# Patient Record
Sex: Female | Born: 1935 | Race: White | Hispanic: No | Smoking: Never smoker
Health system: Southern US, Community
[De-identification: ages and names within clinical notes are randomized; demographics above are authoritative.]

## PROBLEM LIST (undated history)

## (undated) DIAGNOSIS — I10 Essential (primary) hypertension: Secondary | ICD-10-CM

## (undated) DIAGNOSIS — Z972 Presence of dental prosthetic device (complete) (partial): Secondary | ICD-10-CM

## (undated) DIAGNOSIS — Z9289 Personal history of other medical treatment: Secondary | ICD-10-CM

## (undated) DIAGNOSIS — M47812 Spondylosis without myelopathy or radiculopathy, cervical region: Secondary | ICD-10-CM

## (undated) DIAGNOSIS — J449 Chronic obstructive pulmonary disease, unspecified: Secondary | ICD-10-CM

## (undated) DIAGNOSIS — I701 Atherosclerosis of renal artery: Secondary | ICD-10-CM

## (undated) DIAGNOSIS — M4145 Neuromuscular scoliosis, thoracolumbar region: Secondary | ICD-10-CM

## (undated) DIAGNOSIS — I7300 Raynaud's syndrome without gangrene: Secondary | ICD-10-CM

## (undated) DIAGNOSIS — J45909 Unspecified asthma, uncomplicated: Secondary | ICD-10-CM

## (undated) DIAGNOSIS — I5022 Chronic systolic (congestive) heart failure: Secondary | ICD-10-CM

## (undated) DIAGNOSIS — K219 Gastro-esophageal reflux disease without esophagitis: Secondary | ICD-10-CM

## (undated) DIAGNOSIS — K08109 Complete loss of teeth, unspecified cause, unspecified class: Secondary | ICD-10-CM

## (undated) DIAGNOSIS — Z8249 Family history of ischemic heart disease and other diseases of the circulatory system: Secondary | ICD-10-CM

## (undated) DIAGNOSIS — M47816 Spondylosis without myelopathy or radiculopathy, lumbar region: Secondary | ICD-10-CM

## (undated) DIAGNOSIS — D649 Anemia, unspecified: Secondary | ICD-10-CM

## (undated) DIAGNOSIS — E0590 Thyrotoxicosis, unspecified without thyrotoxic crisis or storm: Secondary | ICD-10-CM

## (undated) DIAGNOSIS — Z951 Presence of aortocoronary bypass graft: Secondary | ICD-10-CM

## (undated) DIAGNOSIS — F0390 Unspecified dementia without behavioral disturbance: Secondary | ICD-10-CM

## (undated) DIAGNOSIS — I255 Ischemic cardiomyopathy: Secondary | ICD-10-CM

## (undated) HISTORY — PX: SYMPATHECTOMY: SHX792

## (undated) HISTORY — DX: Neuromuscular scoliosis, thoracolumbar region: M41.45

## (undated) HISTORY — DX: Family history of ischemic heart disease and other diseases of the circulatory system: Z82.49

## (undated) HISTORY — PX: HEMORRHOIDECTOMY WITH HEMORRHOID BANDING: SHX5633

## (undated) HISTORY — PX: PTCA: SHX146

## (undated) HISTORY — PX: CERVICAL FUSION: SHX112

## (undated) HISTORY — DX: Thyrotoxicosis, unspecified without thyrotoxic crisis or storm: E05.90

## (undated) HISTORY — PX: APPENDECTOMY: SHX54

## (undated) HISTORY — DX: Raynaud's syndrome without gangrene: I73.00

## (undated) HISTORY — DX: Spondylosis without myelopathy or radiculopathy, cervical region: M47.812

## (undated) HISTORY — PX: ROTATOR CUFF REPAIR: SHX139

## (undated) HISTORY — PX: TONSILLECTOMY: SHX5217

## (undated) HISTORY — PX: BREAST LUMPECTOMY: SHX2

## (undated) HISTORY — PX: CATARACT EXTRACTION: SUR2

## (undated) HISTORY — PX: UMBILICAL HERNIA REPAIR: SHX196

## (undated) HISTORY — DX: Spondylosis without myelopathy or radiculopathy, lumbar region: M47.816

## (undated) HISTORY — DX: Chronic systolic (congestive) heart failure: I50.22

## (undated) HISTORY — PX: FACIAL COSMETIC SURGERY: SHX629

## (undated) HISTORY — DX: Atherosclerosis of renal artery: I70.1

## (undated) HISTORY — DX: Essential (primary) hypertension: I10

## (undated) HISTORY — PX: OTHER SURGICAL HISTORY: SHX169

## (undated) HISTORY — DX: Presence of aortocoronary bypass graft: Z95.1

## (undated) HISTORY — DX: Personal history of other medical treatment: Z92.89

## (undated) HISTORY — DX: Ischemic cardiomyopathy: I25.5

## (undated) HISTORY — PX: TOTAL ABDOMINAL HYSTERECTOMY: SHX209

---

## 1998-05-07 ENCOUNTER — Ambulatory Visit (HOSPITAL_COMMUNITY): Admission: RE | Admit: 1998-05-07 | Discharge: 1998-05-07 | Payer: Self-pay | Admitting: Obstetrics and Gynecology

## 1999-02-10 ENCOUNTER — Ambulatory Visit (HOSPITAL_COMMUNITY): Admission: RE | Admit: 1999-02-10 | Discharge: 1999-02-10 | Payer: Self-pay | Admitting: Internal Medicine

## 1999-04-18 ENCOUNTER — Ambulatory Visit (HOSPITAL_BASED_OUTPATIENT_CLINIC_OR_DEPARTMENT_OTHER): Admission: RE | Admit: 1999-04-18 | Discharge: 1999-04-18 | Payer: Self-pay | Admitting: Otolaryngology

## 1999-05-19 ENCOUNTER — Ambulatory Visit (HOSPITAL_COMMUNITY): Admission: RE | Admit: 1999-05-19 | Discharge: 1999-05-19 | Payer: Self-pay | Admitting: Obstetrics and Gynecology

## 1999-05-19 ENCOUNTER — Encounter: Payer: Self-pay | Admitting: Obstetrics and Gynecology

## 1999-09-08 ENCOUNTER — Other Ambulatory Visit: Admission: RE | Admit: 1999-09-08 | Discharge: 1999-09-08 | Payer: Self-pay | Admitting: Obstetrics and Gynecology

## 1999-09-26 HISTORY — PX: WEDGE RESECTION: SHX5070

## 2000-02-15 ENCOUNTER — Encounter: Admission: RE | Admit: 2000-02-15 | Discharge: 2000-02-15 | Payer: Self-pay | Admitting: Infectious Diseases

## 2000-04-10 ENCOUNTER — Ambulatory Visit (HOSPITAL_COMMUNITY): Admission: RE | Admit: 2000-04-10 | Discharge: 2000-04-10 | Payer: Self-pay | Admitting: Otolaryngology

## 2000-04-10 ENCOUNTER — Encounter: Payer: Self-pay | Admitting: Otolaryngology

## 2000-04-11 ENCOUNTER — Encounter: Admission: RE | Admit: 2000-04-11 | Discharge: 2000-04-11 | Payer: Self-pay | Admitting: Infectious Diseases

## 2000-04-30 ENCOUNTER — Encounter: Admission: RE | Admit: 2000-04-30 | Discharge: 2000-04-30 | Payer: Self-pay | Admitting: Infectious Diseases

## 2000-05-21 ENCOUNTER — Ambulatory Visit (HOSPITAL_COMMUNITY): Admission: RE | Admit: 2000-05-21 | Discharge: 2000-05-21 | Payer: Self-pay | Admitting: Obstetrics and Gynecology

## 2000-05-21 ENCOUNTER — Encounter: Payer: Self-pay | Admitting: Obstetrics and Gynecology

## 2000-07-29 ENCOUNTER — Inpatient Hospital Stay (HOSPITAL_COMMUNITY): Admission: EM | Admit: 2000-07-29 | Discharge: 2000-08-02 | Payer: Self-pay | Admitting: *Deleted

## 2000-07-30 ENCOUNTER — Encounter: Payer: Self-pay | Admitting: Internal Medicine

## 2000-08-01 ENCOUNTER — Encounter: Payer: Self-pay | Admitting: Internal Medicine

## 2000-08-15 ENCOUNTER — Encounter: Admission: RE | Admit: 2000-08-15 | Discharge: 2000-08-15 | Payer: Self-pay | Admitting: Internal Medicine

## 2000-08-15 ENCOUNTER — Encounter: Payer: Self-pay | Admitting: Internal Medicine

## 2000-08-28 ENCOUNTER — Encounter: Payer: Self-pay | Admitting: Thoracic Surgery

## 2000-08-29 ENCOUNTER — Inpatient Hospital Stay (HOSPITAL_COMMUNITY): Admission: RE | Admit: 2000-08-29 | Discharge: 2000-09-02 | Payer: Self-pay | Admitting: Thoracic Surgery

## 2000-08-29 ENCOUNTER — Encounter (INDEPENDENT_AMBULATORY_CARE_PROVIDER_SITE_OTHER): Payer: Self-pay | Admitting: Specialist

## 2000-08-29 ENCOUNTER — Encounter: Payer: Self-pay | Admitting: Thoracic Surgery

## 2000-08-30 ENCOUNTER — Encounter: Payer: Self-pay | Admitting: Thoracic Surgery

## 2000-08-31 ENCOUNTER — Encounter: Payer: Self-pay | Admitting: Thoracic Surgery

## 2000-09-01 ENCOUNTER — Encounter: Payer: Self-pay | Admitting: Thoracic Surgery

## 2000-09-07 ENCOUNTER — Encounter: Admission: RE | Admit: 2000-09-07 | Discharge: 2000-09-07 | Payer: Self-pay | Admitting: Thoracic Surgery

## 2000-09-07 ENCOUNTER — Encounter: Payer: Self-pay | Admitting: Thoracic Surgery

## 2000-09-28 ENCOUNTER — Other Ambulatory Visit: Admission: RE | Admit: 2000-09-28 | Discharge: 2000-09-28 | Payer: Self-pay | Admitting: Otolaryngology

## 2000-10-03 ENCOUNTER — Encounter: Admission: RE | Admit: 2000-10-03 | Discharge: 2000-10-03 | Payer: Self-pay | Admitting: Thoracic Surgery

## 2000-10-03 ENCOUNTER — Encounter: Payer: Self-pay | Admitting: Thoracic Surgery

## 2000-12-04 ENCOUNTER — Encounter: Payer: Self-pay | Admitting: Thoracic Surgery

## 2000-12-04 ENCOUNTER — Encounter: Admission: RE | Admit: 2000-12-04 | Discharge: 2000-12-04 | Payer: Self-pay | Admitting: Thoracic Surgery

## 2001-03-20 ENCOUNTER — Encounter: Payer: Self-pay | Admitting: Thoracic Surgery

## 2001-03-20 ENCOUNTER — Encounter: Admission: RE | Admit: 2001-03-20 | Discharge: 2001-03-20 | Payer: Self-pay | Admitting: Thoracic Surgery

## 2001-05-09 ENCOUNTER — Ambulatory Visit (HOSPITAL_BASED_OUTPATIENT_CLINIC_OR_DEPARTMENT_OTHER): Admission: RE | Admit: 2001-05-09 | Discharge: 2001-05-09 | Payer: Self-pay | Admitting: Orthopedic Surgery

## 2001-05-23 ENCOUNTER — Ambulatory Visit (HOSPITAL_COMMUNITY): Admission: RE | Admit: 2001-05-23 | Discharge: 2001-05-23 | Payer: Self-pay | Admitting: Obstetrics and Gynecology

## 2001-05-23 ENCOUNTER — Encounter: Payer: Self-pay | Admitting: Obstetrics and Gynecology

## 2001-11-19 ENCOUNTER — Encounter: Payer: Self-pay | Admitting: Internal Medicine

## 2001-11-19 ENCOUNTER — Encounter: Admission: RE | Admit: 2001-11-19 | Discharge: 2001-11-19 | Payer: Self-pay | Admitting: Internal Medicine

## 2001-12-09 ENCOUNTER — Emergency Department (HOSPITAL_COMMUNITY): Admission: EM | Admit: 2001-12-09 | Discharge: 2001-12-09 | Payer: Self-pay | Admitting: Emergency Medicine

## 2001-12-14 ENCOUNTER — Encounter: Payer: Self-pay | Admitting: Neurological Surgery

## 2001-12-14 ENCOUNTER — Ambulatory Visit (HOSPITAL_COMMUNITY): Admission: RE | Admit: 2001-12-14 | Discharge: 2001-12-14 | Payer: Self-pay | Admitting: Neurological Surgery

## 2002-02-14 ENCOUNTER — Encounter: Payer: Self-pay | Admitting: Neurological Surgery

## 2002-02-18 ENCOUNTER — Encounter: Payer: Self-pay | Admitting: Neurological Surgery

## 2002-02-18 ENCOUNTER — Inpatient Hospital Stay (HOSPITAL_COMMUNITY): Admission: RE | Admit: 2002-02-18 | Discharge: 2002-02-19 | Payer: Self-pay | Admitting: Neurological Surgery

## 2002-05-29 ENCOUNTER — Encounter: Payer: Self-pay | Admitting: Obstetrics and Gynecology

## 2002-05-29 ENCOUNTER — Ambulatory Visit (HOSPITAL_COMMUNITY): Admission: RE | Admit: 2002-05-29 | Discharge: 2002-05-29 | Payer: Self-pay | Admitting: Obstetrics and Gynecology

## 2002-11-26 ENCOUNTER — Encounter: Payer: Self-pay | Admitting: Neurological Surgery

## 2002-11-26 ENCOUNTER — Ambulatory Visit (HOSPITAL_COMMUNITY): Admission: RE | Admit: 2002-11-26 | Discharge: 2002-11-26 | Payer: Self-pay | Admitting: Neurological Surgery

## 2003-06-04 ENCOUNTER — Ambulatory Visit (HOSPITAL_COMMUNITY): Admission: RE | Admit: 2003-06-04 | Discharge: 2003-06-04 | Payer: Self-pay | Admitting: Obstetrics and Gynecology

## 2003-06-04 ENCOUNTER — Encounter: Payer: Self-pay | Admitting: Obstetrics and Gynecology

## 2003-06-11 ENCOUNTER — Encounter: Payer: Self-pay | Admitting: Internal Medicine

## 2003-06-11 ENCOUNTER — Encounter: Admission: RE | Admit: 2003-06-11 | Discharge: 2003-06-11 | Payer: Self-pay | Admitting: Internal Medicine

## 2003-09-09 ENCOUNTER — Encounter: Admission: RE | Admit: 2003-09-09 | Discharge: 2003-09-09 | Payer: Self-pay | Admitting: Neurological Surgery

## 2003-10-07 ENCOUNTER — Ambulatory Visit (HOSPITAL_COMMUNITY): Admission: RE | Admit: 2003-10-07 | Discharge: 2003-10-07 | Payer: Self-pay | Admitting: Neurological Surgery

## 2004-06-06 ENCOUNTER — Ambulatory Visit (HOSPITAL_COMMUNITY): Admission: RE | Admit: 2004-06-06 | Discharge: 2004-06-06 | Payer: Self-pay | Admitting: Obstetrics and Gynecology

## 2005-06-26 ENCOUNTER — Encounter: Admission: RE | Admit: 2005-06-26 | Discharge: 2005-06-26 | Payer: Self-pay | Admitting: Neurological Surgery

## 2005-07-10 ENCOUNTER — Ambulatory Visit (HOSPITAL_COMMUNITY): Admission: RE | Admit: 2005-07-10 | Discharge: 2005-07-10 | Payer: Self-pay | Admitting: Internal Medicine

## 2005-09-04 ENCOUNTER — Encounter: Admission: RE | Admit: 2005-09-04 | Discharge: 2005-09-04 | Payer: Self-pay | Admitting: Orthopedic Surgery

## 2005-09-08 ENCOUNTER — Ambulatory Visit (HOSPITAL_COMMUNITY): Admission: RE | Admit: 2005-09-08 | Discharge: 2005-09-08 | Payer: Self-pay | Admitting: Orthopedic Surgery

## 2005-09-08 ENCOUNTER — Ambulatory Visit (HOSPITAL_BASED_OUTPATIENT_CLINIC_OR_DEPARTMENT_OTHER): Admission: RE | Admit: 2005-09-08 | Discharge: 2005-09-09 | Payer: Self-pay | Admitting: Orthopedic Surgery

## 2006-01-31 ENCOUNTER — Ambulatory Visit (HOSPITAL_COMMUNITY): Admission: RE | Admit: 2006-01-31 | Discharge: 2006-01-31 | Payer: Self-pay | Admitting: Neurological Surgery

## 2006-07-11 ENCOUNTER — Ambulatory Visit (HOSPITAL_COMMUNITY): Admission: RE | Admit: 2006-07-11 | Discharge: 2006-07-11 | Payer: Self-pay | Admitting: Family Medicine

## 2007-01-10 ENCOUNTER — Ambulatory Visit (HOSPITAL_COMMUNITY): Admission: RE | Admit: 2007-01-10 | Discharge: 2007-01-10 | Payer: Self-pay | Admitting: Orthopedic Surgery

## 2007-05-28 ENCOUNTER — Encounter: Admission: RE | Admit: 2007-05-28 | Discharge: 2007-05-28 | Payer: Self-pay | Admitting: Internal Medicine

## 2007-05-28 ENCOUNTER — Encounter: Admission: RE | Admit: 2007-05-28 | Discharge: 2007-05-28 | Payer: Self-pay | Admitting: Orthopedic Surgery

## 2007-05-29 ENCOUNTER — Ambulatory Visit (HOSPITAL_BASED_OUTPATIENT_CLINIC_OR_DEPARTMENT_OTHER): Admission: RE | Admit: 2007-05-29 | Discharge: 2007-05-30 | Payer: Self-pay | Admitting: Orthopedic Surgery

## 2007-07-15 ENCOUNTER — Ambulatory Visit (HOSPITAL_COMMUNITY): Admission: RE | Admit: 2007-07-15 | Discharge: 2007-07-15 | Payer: Self-pay | Admitting: Obstetrics and Gynecology

## 2008-01-20 ENCOUNTER — Ambulatory Visit (HOSPITAL_COMMUNITY): Admission: RE | Admit: 2008-01-20 | Discharge: 2008-01-20 | Payer: Self-pay | Admitting: Neurological Surgery

## 2008-02-07 ENCOUNTER — Encounter: Admission: RE | Admit: 2008-02-07 | Discharge: 2008-02-07 | Payer: Self-pay | Admitting: Neurological Surgery

## 2008-03-05 ENCOUNTER — Ambulatory Visit (HOSPITAL_BASED_OUTPATIENT_CLINIC_OR_DEPARTMENT_OTHER): Admission: RE | Admit: 2008-03-05 | Discharge: 2008-03-05 | Payer: Self-pay | Admitting: Orthopedic Surgery

## 2008-07-15 ENCOUNTER — Ambulatory Visit (HOSPITAL_COMMUNITY): Admission: RE | Admit: 2008-07-15 | Discharge: 2008-07-15 | Payer: Self-pay | Admitting: Obstetrics and Gynecology

## 2008-09-02 ENCOUNTER — Encounter: Admission: RE | Admit: 2008-09-02 | Discharge: 2008-09-02 | Payer: Self-pay | Admitting: Orthopedic Surgery

## 2008-09-03 ENCOUNTER — Ambulatory Visit (HOSPITAL_BASED_OUTPATIENT_CLINIC_OR_DEPARTMENT_OTHER): Admission: RE | Admit: 2008-09-03 | Discharge: 2008-09-03 | Payer: Self-pay | Admitting: Orthopedic Surgery

## 2008-09-25 HISTORY — PX: OTHER SURGICAL HISTORY: SHX169

## 2008-09-25 HISTORY — PX: CORONARY ARTERY BYPASS GRAFT: SHX141

## 2008-10-29 ENCOUNTER — Ambulatory Visit (HOSPITAL_BASED_OUTPATIENT_CLINIC_OR_DEPARTMENT_OTHER): Admission: RE | Admit: 2008-10-29 | Discharge: 2008-10-29 | Payer: Self-pay | Admitting: Orthopedic Surgery

## 2008-12-14 ENCOUNTER — Encounter (INDEPENDENT_AMBULATORY_CARE_PROVIDER_SITE_OTHER): Payer: Self-pay | Admitting: Cardiology

## 2008-12-14 ENCOUNTER — Ambulatory Visit: Payer: Self-pay | Admitting: Thoracic Surgery (Cardiothoracic Vascular Surgery)

## 2008-12-14 ENCOUNTER — Ambulatory Visit (HOSPITAL_COMMUNITY): Admission: RE | Admit: 2008-12-14 | Discharge: 2008-12-14 | Payer: Self-pay | Admitting: Cardiology

## 2008-12-17 ENCOUNTER — Inpatient Hospital Stay (HOSPITAL_BASED_OUTPATIENT_CLINIC_OR_DEPARTMENT_OTHER): Admission: RE | Admit: 2008-12-17 | Discharge: 2008-12-17 | Payer: Self-pay | Admitting: Cardiology

## 2008-12-28 ENCOUNTER — Ambulatory Visit (HOSPITAL_COMMUNITY)
Admission: RE | Admit: 2008-12-28 | Discharge: 2008-12-28 | Payer: Self-pay | Admitting: Thoracic Surgery (Cardiothoracic Vascular Surgery)

## 2008-12-28 ENCOUNTER — Ambulatory Visit: Payer: Self-pay | Admitting: Thoracic Surgery (Cardiothoracic Vascular Surgery)

## 2008-12-28 ENCOUNTER — Ambulatory Visit: Payer: Self-pay | Admitting: Vascular Surgery

## 2008-12-28 ENCOUNTER — Encounter: Payer: Self-pay | Admitting: Thoracic Surgery (Cardiothoracic Vascular Surgery)

## 2009-01-01 ENCOUNTER — Ambulatory Visit (HOSPITAL_COMMUNITY)
Admission: RE | Admit: 2009-01-01 | Discharge: 2009-01-01 | Payer: Self-pay | Admitting: Thoracic Surgery (Cardiothoracic Vascular Surgery)

## 2009-01-04 ENCOUNTER — Encounter: Payer: Self-pay | Admitting: Thoracic Surgery (Cardiothoracic Vascular Surgery)

## 2009-01-04 ENCOUNTER — Inpatient Hospital Stay (HOSPITAL_COMMUNITY)
Admission: RE | Admit: 2009-01-04 | Discharge: 2009-01-18 | Payer: Self-pay | Admitting: Thoracic Surgery (Cardiothoracic Vascular Surgery)

## 2009-01-04 ENCOUNTER — Ambulatory Visit: Payer: Self-pay | Admitting: Thoracic Surgery (Cardiothoracic Vascular Surgery)

## 2009-02-08 ENCOUNTER — Encounter
Admission: RE | Admit: 2009-02-08 | Discharge: 2009-02-08 | Payer: Self-pay | Admitting: Thoracic Surgery (Cardiothoracic Vascular Surgery)

## 2009-02-08 ENCOUNTER — Ambulatory Visit: Payer: Self-pay | Admitting: Thoracic Surgery (Cardiothoracic Vascular Surgery)

## 2009-02-11 ENCOUNTER — Encounter (HOSPITAL_COMMUNITY): Admission: RE | Admit: 2009-02-11 | Discharge: 2009-05-12 | Payer: Self-pay | Admitting: Cardiology

## 2009-03-31 ENCOUNTER — Inpatient Hospital Stay (HOSPITAL_COMMUNITY): Admission: AD | Admit: 2009-03-31 | Discharge: 2009-04-01 | Payer: Self-pay | Admitting: Cardiology

## 2009-04-02 ENCOUNTER — Ambulatory Visit: Payer: Self-pay | Admitting: Cardiology

## 2009-04-03 ENCOUNTER — Inpatient Hospital Stay (HOSPITAL_COMMUNITY): Admission: EM | Admit: 2009-04-03 | Discharge: 2009-04-05 | Payer: Self-pay | Admitting: Emergency Medicine

## 2009-05-06 DIAGNOSIS — E785 Hyperlipidemia, unspecified: Secondary | ICD-10-CM

## 2009-05-06 DIAGNOSIS — I10 Essential (primary) hypertension: Secondary | ICD-10-CM | POA: Insufficient documentation

## 2009-05-07 ENCOUNTER — Ambulatory Visit: Payer: Self-pay | Admitting: Internal Medicine

## 2009-05-07 DIAGNOSIS — R0602 Shortness of breath: Secondary | ICD-10-CM | POA: Insufficient documentation

## 2009-05-12 ENCOUNTER — Telehealth: Payer: Self-pay | Admitting: Internal Medicine

## 2009-05-13 ENCOUNTER — Encounter (HOSPITAL_COMMUNITY): Admission: RE | Admit: 2009-05-13 | Discharge: 2009-06-11 | Payer: Self-pay | Admitting: Cardiology

## 2009-05-13 ENCOUNTER — Encounter: Payer: Self-pay | Admitting: Internal Medicine

## 2009-05-14 ENCOUNTER — Telehealth: Payer: Self-pay | Admitting: Internal Medicine

## 2009-05-14 ENCOUNTER — Telehealth (INDEPENDENT_AMBULATORY_CARE_PROVIDER_SITE_OTHER): Payer: Self-pay | Admitting: *Deleted

## 2009-05-17 ENCOUNTER — Encounter: Payer: Self-pay | Admitting: Internal Medicine

## 2009-05-17 ENCOUNTER — Ambulatory Visit: Payer: Self-pay | Admitting: Thoracic Surgery (Cardiothoracic Vascular Surgery)

## 2009-05-24 ENCOUNTER — Ambulatory Visit: Payer: Self-pay | Admitting: Internal Medicine

## 2009-05-25 ENCOUNTER — Encounter: Payer: Self-pay | Admitting: Internal Medicine

## 2009-05-28 ENCOUNTER — Telehealth: Payer: Self-pay | Admitting: Internal Medicine

## 2009-05-28 ENCOUNTER — Encounter: Payer: Self-pay | Admitting: Internal Medicine

## 2009-06-11 ENCOUNTER — Telehealth: Payer: Self-pay | Admitting: Internal Medicine

## 2009-06-21 ENCOUNTER — Encounter (HOSPITAL_COMMUNITY): Admission: RE | Admit: 2009-06-21 | Discharge: 2009-09-19 | Payer: Self-pay | Admitting: Internal Medicine

## 2009-06-22 ENCOUNTER — Ambulatory Visit: Admission: RE | Admit: 2009-06-22 | Discharge: 2009-06-22 | Payer: Self-pay | Admitting: Internal Medicine

## 2009-07-02 ENCOUNTER — Encounter: Payer: Self-pay | Admitting: Internal Medicine

## 2009-07-16 ENCOUNTER — Encounter: Payer: Self-pay | Admitting: Internal Medicine

## 2009-07-23 ENCOUNTER — Telehealth: Payer: Self-pay | Admitting: Internal Medicine

## 2009-07-23 ENCOUNTER — Ambulatory Visit: Payer: Self-pay | Admitting: Internal Medicine

## 2009-07-27 ENCOUNTER — Telehealth: Payer: Self-pay | Admitting: Internal Medicine

## 2009-07-30 ENCOUNTER — Encounter: Payer: Self-pay | Admitting: Internal Medicine

## 2009-10-12 ENCOUNTER — Encounter: Admission: RE | Admit: 2009-10-12 | Discharge: 2009-10-12 | Payer: Self-pay | Admitting: Obstetrics and Gynecology

## 2009-11-02 ENCOUNTER — Telehealth (INDEPENDENT_AMBULATORY_CARE_PROVIDER_SITE_OTHER): Payer: Self-pay | Admitting: *Deleted

## 2010-10-25 NOTE — Progress Notes (Signed)
Summary: order  Phone Note Call from Patient   Caller: Patient Call For: ramaswamy Summary of Call: pt need order to have oxygen and concentrator picked up Initial call taken by: Rickard Patience,  November 02, 2009 11:22 AM  Follow-up for Phone Call        pt states she has not used oxygen in 2 months. She wants an order sent to have it picked up. Please advise if ok to place the order. Carron Curie CMA  November 02, 2009 11:44 AM   Additional Follow-up for Phone Call Additional follow up Details #1::        I thought I did this long ago. wil do dme order now Additional Follow-up by: Kalman Shan MD,  November 02, 2009 4:11 PM    Additional Follow-up for Phone Call Additional follow up Details #2::    Pt aware order was sent.   Vernie Murders  November 02, 2009 4:21 PM

## 2010-12-08 ENCOUNTER — Other Ambulatory Visit: Payer: Self-pay | Admitting: Gastroenterology

## 2010-12-08 DIAGNOSIS — K224 Dyskinesia of esophagus: Secondary | ICD-10-CM

## 2010-12-09 ENCOUNTER — Encounter (INDEPENDENT_AMBULATORY_CARE_PROVIDER_SITE_OTHER): Payer: Self-pay | Admitting: *Deleted

## 2010-12-13 ENCOUNTER — Ambulatory Visit
Admission: RE | Admit: 2010-12-13 | Discharge: 2010-12-13 | Disposition: A | Discharge status: Home / Self Care | Payer: Medicare Other | Source: Ambulatory Visit | Attending: Gastroenterology | Admitting: Gastroenterology

## 2010-12-13 DIAGNOSIS — K224 Dyskinesia of esophagus: Secondary | ICD-10-CM

## 2010-12-13 NOTE — Letter (Signed)
Summary: Appointment - Reminder 2  [facility], Main Office  [address]   [place], [place] [address]   Phone: [phone]  Fax: [phone]     December 09, 2010 MRN: [id number]   [name] [address] # [place], [place]  [address]   Dear [name],  Our records indicate that it is time to schedule a follow-up appointment.  Dr. Johney Frame recommended that you follow up with [place] in April. It is very important that we reach you to schedule this appointment. We look forward to participating in your health care needs. Please contact us at the number listed above at your earliest convenience to schedule your appointment.  If you are unable to make an appointment at this time, give [place] a call so we can update our records.     Sincerely,   Glass blower/designer

## 2010-12-25 DIAGNOSIS — Z951 Presence of aortocoronary bypass graft: Secondary | ICD-10-CM | POA: Insufficient documentation

## 2010-12-25 HISTORY — DX: Presence of aortocoronary bypass graft: Z95.1

## 2010-12-30 LAB — BLOOD GAS, ARTERIAL
Acid-base deficit: 0.5 mmol/L (ref 0.0–2.0)
Bicarbonate: 22.7 mEq/L (ref 20.0–24.0)
Drawn by: 101881
FIO2: 0.21 %
O2 Saturation: 96.7 %
O2 Saturation: 98 %
Patient temperature: 98.6
Patient temperature: 98.6
TCO2: 20.6 mmol/L (ref 0–100)
pO2, Arterial: 97.4 mmHg (ref 80.0–100.0)

## 2011-01-02 LAB — BASIC METABOLIC PANEL
BUN: 14 mg/dL (ref 6–23)
CO2: 27 mEq/L (ref 19–32)
Calcium: 8.3 mg/dL — ABNORMAL LOW (ref 8.4–10.5)
GFR calc non Af Amer: >60 mL/min (ref >60)
Glucose, Bld: 117 mg/dL — ABNORMAL HIGH (ref 70–99)
Potassium: 3.9 mEq/L (ref 3.5–5.1)

## 2011-01-02 LAB — CBC
HCT: 27 % — ABNORMAL LOW (ref 36.0–46.0)
HCT: 27.3 % — ABNORMAL LOW (ref 36.0–46.0)
HCT: 27.4 % — ABNORMAL LOW (ref 36.0–46.0)
HCT: 29.2 % — ABNORMAL LOW (ref 36.0–46.0)
Hemoglobin: 9 g/dL — ABNORMAL LOW (ref 12.0–15.0)
Hemoglobin: 9 g/dL — ABNORMAL LOW (ref 12.0–15.0)
Hemoglobin: 9.5 g/dL — ABNORMAL LOW (ref 12.0–15.0)
MCHC: 32.6 g/dL (ref 30.0–36.0)
MCHC: 33 g/dL (ref 30.0–36.0)
MCV: 82.6 fL (ref 78.0–100.0)
Platelets: 128 10*3/uL — ABNORMAL LOW (ref 150–400)
Platelets: 142 10*3/uL — ABNORMAL LOW (ref 150–400)
Platelets: 164 10*3/uL (ref 150–400)
RBC: 3.31 MIL/uL — ABNORMAL LOW (ref 3.87–5.11)
RBC: 3.32 MIL/uL — ABNORMAL LOW (ref 3.87–5.11)
RBC: 3.57 MIL/uL — ABNORMAL LOW (ref 3.87–5.11)
RDW: 15.1 % (ref 11.5–15.5)
RDW: 15.2 % (ref 11.5–15.5)
RDW: 15.3 % (ref 11.5–15.5)
RDW: 15.3 % (ref 11.5–15.5)
WBC: 5.4 10*3/uL (ref 4.0–10.5)
WBC: 5.5 10*3/uL (ref 4.0–10.5)
WBC: 7.7 10*3/uL (ref 4.0–10.5)

## 2011-01-02 LAB — CULTURE, BLOOD (ROUTINE X 2): Culture: NO GROWTH

## 2011-01-02 LAB — DIFFERENTIAL
Basophils Absolute: 0 10*3/uL (ref 0.0–0.1)
Basophils Relative: 1 % (ref 0–1)
Eosinophils Relative: 16 % — ABNORMAL HIGH (ref 0–5)
Lymphocytes Relative: 31 % (ref 12–46)
Monocytes Absolute: 0.6 10*3/uL (ref 0.1–1.0)

## 2011-01-02 LAB — PROTIME-INR
INR: 1.1 (ref 0.00–1.49)
Prothrombin Time: 13.9 seconds (ref 11.6–15.2)

## 2011-01-04 LAB — CBC
HCT: 27.1 % — ABNORMAL LOW (ref 36.0–46.0)
HCT: 28.9 % — ABNORMAL LOW (ref 36.0–46.0)
HCT: 29 % — ABNORMAL LOW (ref 36.0–46.0)
HCT: 30.1 % — ABNORMAL LOW (ref 36.0–46.0)
HCT: 28.7 % — ABNORMAL LOW (ref 36.0–46.0)
HCT: 29.7 % — ABNORMAL LOW (ref 36.0–46.0)
HCT: 30.3 % — ABNORMAL LOW (ref 36.0–46.0)
HCT: 33.8 % — ABNORMAL LOW (ref 36.0–46.0)
Hemoglobin: 9.4 g/dL — ABNORMAL LOW (ref 12.0–15.0)
Hemoglobin: 9.5 g/dL — ABNORMAL LOW (ref 12.0–15.0)
Hemoglobin: 9.8 g/dL — ABNORMAL LOW (ref 12.0–15.0)
Hemoglobin: 9.8 g/dL — ABNORMAL LOW (ref 12.0–15.0)
Hemoglobin: 11.7 g/dL — ABNORMAL LOW (ref 12.0–15.0)
Hemoglobin: 9.4 g/dL — ABNORMAL LOW (ref 12.0–15.0)
Hemoglobin: 9.8 g/dL — ABNORMAL LOW (ref 12.0–15.0)
MCHC: 33.8 g/dL (ref 30.0–36.0)
MCHC: 33.8 g/dL (ref 30.0–36.0)
MCHC: 34.3 g/dL (ref 30.0–36.0)
MCHC: 34.6 g/dL (ref 30.0–36.0)
MCHC: 34.6 g/dL (ref 30.0–36.0)
MCHC: 35 g/dL (ref 30.0–36.0)
MCHC: 32.8 g/dL (ref 30.0–36.0)
MCHC: 34.2 g/dL (ref 30.0–36.0)
MCHC: 34.2 g/dL (ref 30.0–36.0)
MCHC: 34.7 g/dL (ref 30.0–36.0)
MCV: 87.6 fL (ref 78.0–100.0)
MCV: 88.2 fL (ref 78.0–100.0)
MCV: 88.2 fL (ref 78.0–100.0)
MCV: 88.6 fL (ref 78.0–100.0)
MCV: 88.8 fL (ref 78.0–100.0)
MCV: 85.3 fL (ref 78.0–100.0)
MCV: 86.9 fL (ref 78.0–100.0)
MCV: 87.4 fL (ref 78.0–100.0)
MCV: 87.5 fL (ref 78.0–100.0)
MCV: 87.6 fL (ref 78.0–100.0)
MCV: 88.4 fL (ref 78.0–100.0)
Platelets: 311 10*3/uL (ref 150–400)
Platelets: 344 10*3/uL (ref 150–400)
Platelets: 368 10*3/uL (ref 150–400)
Platelets: 375 10*3/uL (ref 150–400)
Platelets: 132 10*3/uL — ABNORMAL LOW (ref 150–400)
Platelets: 160 10*3/uL (ref 150–400)
Platelets: 165 10*3/uL (ref 150–400)
RBC: 3.09 MIL/uL — ABNORMAL LOW (ref 3.87–5.11)
RBC: 3.28 MIL/uL — ABNORMAL LOW (ref 3.87–5.11)
RBC: 3.28 MIL/uL — ABNORMAL LOW (ref 3.87–5.11)
RBC: 3.38 MIL/uL — ABNORMAL LOW (ref 3.87–5.11)
RBC: 3.41 MIL/uL — ABNORMAL LOW (ref 3.87–5.11)
RBC: 3.23 MIL/uL — ABNORMAL LOW (ref 3.87–5.11)
RBC: 3.28 MIL/uL — ABNORMAL LOW (ref 3.87–5.11)
RBC: 3.47 MIL/uL — ABNORMAL LOW (ref 3.87–5.11)
RBC: 4.67 MIL/uL (ref 3.87–5.11)
RDW: 15.3 % (ref 11.5–15.5)
RDW: 15.4 % (ref 11.5–15.5)
RDW: 16 % — ABNORMAL HIGH (ref 11.5–15.5)
RDW: 16.2 % — ABNORMAL HIGH (ref 11.5–15.5)
RDW: 14.3 % (ref 11.5–15.5)
RDW: 14.6 % (ref 11.5–15.5)
RDW: 14.7 % (ref 11.5–15.5)
RDW: 14.9 % (ref 11.5–15.5)
WBC: 12 10*3/uL — ABNORMAL HIGH (ref 4.0–10.5)
WBC: 14.7 10*3/uL — ABNORMAL HIGH (ref 4.0–10.5)
WBC: 15.2 10*3/uL — ABNORMAL HIGH (ref 4.0–10.5)
WBC: 8.1 10*3/uL (ref 4.0–10.5)
WBC: 6.2 10*3/uL (ref 4.0–10.5)
WBC: 8.5 10*3/uL (ref 4.0–10.5)
WBC: 9 10*3/uL (ref 4.0–10.5)
WBC: 9.4 10*3/uL (ref 4.0–10.5)

## 2011-01-04 LAB — POCT I-STAT 4, (NA,K, GLUC, HGB,HCT)
Glucose, Bld: 111 mg/dL — ABNORMAL HIGH (ref 70–99)
Glucose, Bld: 155 mg/dL — ABNORMAL HIGH (ref 70–99)
Glucose, Bld: 62 mg/dL — ABNORMAL LOW (ref 70–99)
Glucose, Bld: 81 mg/dL (ref 70–99)
HCT: 22 % — ABNORMAL LOW (ref 36.0–46.0)
HCT: 33 % — ABNORMAL LOW (ref 36.0–46.0)
HCT: 35 % — ABNORMAL LOW (ref 36.0–46.0)
Hemoglobin: 11.2 g/dL — ABNORMAL LOW (ref 12.0–15.0)
Hemoglobin: 11.9 g/dL — ABNORMAL LOW (ref 12.0–15.0)
Hemoglobin: 12.2 g/dL (ref 12.0–15.0)
Hemoglobin: 7.5 g/dL — CL (ref 12.0–15.0)
Potassium: 3.9 mEq/L (ref 3.5–5.1)
Potassium: 4.2 mEq/L (ref 3.5–5.1)
Sodium: 140 mEq/L (ref 135–145)

## 2011-01-04 LAB — BASIC METABOLIC PANEL
BUN: 10 mg/dL (ref 6–23)
BUN: 12 mg/dL (ref 6–23)
BUN: 12 mg/dL (ref 6–23)
BUN: 13 mg/dL (ref 6–23)
BUN: 18 mg/dL (ref 6–23)
BUN: 23 mg/dL (ref 6–23)
BUN: 14 mg/dL (ref 6–23)
BUN: 17 mg/dL (ref 6–23)
CO2: 20 mEq/L (ref 19–32)
CO2: 22 mEq/L (ref 19–32)
CO2: 25 mEq/L (ref 19–32)
CO2: 26 mEq/L (ref 19–32)
CO2: 30 mEq/L (ref 19–32)
CO2: 31 mEq/L (ref 19–32)
CO2: 23 mEq/L (ref 19–32)
Calcium: 6.9 mg/dL — ABNORMAL LOW (ref 8.4–10.5)
Calcium: 8.2 mg/dL — ABNORMAL LOW (ref 8.4–10.5)
Calcium: 8.3 mg/dL — ABNORMAL LOW (ref 8.4–10.5)
Calcium: 8.5 mg/dL (ref 8.4–10.5)
Calcium: 8.5 mg/dL (ref 8.4–10.5)
Calcium: 8.9 mg/dL (ref 8.4–10.5)
Chloride: 100 mEq/L (ref 96–112)
Chloride: 101 mEq/L (ref 96–112)
Chloride: 104 mEq/L (ref 96–112)
Chloride: 95 mEq/L — ABNORMAL LOW (ref 96–112)
Chloride: 96 mEq/L (ref 96–112)
Chloride: 99 mEq/L (ref 96–112)
Chloride: 99 mEq/L (ref 96–112)
Chloride: 102 mEq/L (ref 96–112)
Chloride: 96 mEq/L (ref 96–112)
Creatinine, Ser: 0.73 mg/dL (ref 0.4–1.2)
Creatinine, Ser: 0.76 mg/dL (ref 0.4–1.2)
Creatinine, Ser: 0.77 mg/dL (ref 0.4–1.2)
Creatinine, Ser: 0.9 mg/dL (ref 0.4–1.2)
Creatinine, Ser: 0.96 mg/dL (ref 0.4–1.2)
Creatinine, Ser: 1.02 mg/dL (ref 0.4–1.2)
Creatinine, Ser: 1.07 mg/dL (ref 0.4–1.2)
Creatinine, Ser: 0.91 mg/dL (ref 0.4–1.2)
GFR calc Af Amer: >60 mL/min (ref >60)
GFR calc Af Amer: >60 mL/min (ref >60)
GFR calc Af Amer: >60 mL/min (ref >60)
GFR calc Af Amer: >60 mL/min (ref >60)
GFR calc Af Amer: >60 mL/min (ref >60)
GFR calc non Af Amer: 50 mL/min — ABNORMAL LOW (ref >60)
GFR calc non Af Amer: 53 mL/min — ABNORMAL LOW (ref >60)
GFR calc non Af Amer: >60 mL/min (ref >60)
GFR calc non Af Amer: >60 mL/min (ref >60)
GFR calc non Af Amer: >60 mL/min (ref >60)
GFR calc non Af Amer: >60 mL/min (ref >60)
GFR calc non Af Amer: >60 mL/min (ref >60)
Glucose, Bld: 100 mg/dL — ABNORMAL HIGH (ref 70–99)
Glucose, Bld: 109 mg/dL — ABNORMAL HIGH (ref 70–99)
Glucose, Bld: 111 mg/dL — ABNORMAL HIGH (ref 70–99)
Glucose, Bld: 129 mg/dL — ABNORMAL HIGH (ref 70–99)
Glucose, Bld: 99 mg/dL (ref 70–99)
Glucose, Bld: 108 mg/dL — ABNORMAL HIGH (ref 70–99)
Glucose, Bld: 125 mg/dL — ABNORMAL HIGH (ref 70–99)
Potassium: 2.8 mEq/L — ABNORMAL LOW (ref 3.5–5.1)
Potassium: 3.5 mEq/L (ref 3.5–5.1)
Potassium: 3.5 mEq/L (ref 3.5–5.1)
Potassium: 4.3 mEq/L (ref 3.5–5.1)
Potassium: 4.4 mEq/L (ref 3.5–5.1)
Potassium: 3.4 mEq/L — ABNORMAL LOW (ref 3.5–5.1)
Potassium: 4.3 mEq/L (ref 3.5–5.1)
Sodium: 130 mEq/L — ABNORMAL LOW (ref 135–145)
Sodium: 132 mEq/L — ABNORMAL LOW (ref 135–145)
Sodium: 133 mEq/L — ABNORMAL LOW (ref 135–145)
Sodium: 134 mEq/L — ABNORMAL LOW (ref 135–145)
Sodium: 131 mEq/L — ABNORMAL LOW (ref 135–145)
Sodium: 132 mEq/L — ABNORMAL LOW (ref 135–145)

## 2011-01-04 LAB — PROTIME-INR
INR: 1.2 (ref 0.00–1.49)
INR: 1.3 (ref 0.00–1.49)
INR: 1.8 — ABNORMAL HIGH (ref 0.00–1.49)
INR: 2 — ABNORMAL HIGH (ref 0.00–1.49)
INR: 2 — ABNORMAL HIGH (ref 0.00–1.49)
INR: 2.1 — ABNORMAL HIGH (ref 0.00–1.49)
INR: 2.2 — ABNORMAL HIGH (ref 0.00–1.49)
Prothrombin Time: 17.9 seconds — ABNORMAL HIGH (ref 11.6–15.2)
Prothrombin Time: 21.4 seconds — ABNORMAL HIGH (ref 11.6–15.2)
Prothrombin Time: 24.1 seconds — ABNORMAL HIGH (ref 11.6–15.2)
Prothrombin Time: 24.3 seconds — ABNORMAL HIGH (ref 11.6–15.2)
Prothrombin Time: 24.3 seconds — ABNORMAL HIGH (ref 11.6–15.2)
Prothrombin Time: 24.4 seconds — ABNORMAL HIGH (ref 11.6–15.2)
Prothrombin Time: 25.4 seconds — ABNORMAL HIGH (ref 11.6–15.2)
Prothrombin Time: 17.6 seconds — ABNORMAL HIGH (ref 11.6–15.2)

## 2011-01-04 LAB — CREATININE, SERUM: GFR calc non Af Amer: >60 mL/min (ref >60)

## 2011-01-04 LAB — TYPE AND SCREEN
ABO/RH(D): A POS
ABO/RH(D): A POS
Antibody Screen: NEGATIVE

## 2011-01-04 LAB — POCT I-STAT, CHEM 8
BUN: 13 mg/dL (ref 6–23)
BUN: 13 mg/dL (ref 6–23)
BUN: 20 mg/dL (ref 6–23)
Calcium, Ion: 1.18 mmol/L (ref 1.12–1.32)
Chloride: 91 mEq/L — ABNORMAL LOW (ref 96–112)
Chloride: 106 mEq/L (ref 96–112)
Chloride: 99 mEq/L (ref 96–112)
Creatinine, Ser: 0.9 mg/dL (ref 0.4–1.2)
Glucose, Bld: 126 mg/dL — ABNORMAL HIGH (ref 70–99)
HCT: 29 % — ABNORMAL LOW (ref 36.0–46.0)
HCT: 30 % — ABNORMAL LOW (ref 36.0–46.0)
Potassium: 3.4 mEq/L — ABNORMAL LOW (ref 3.5–5.1)
Potassium: 3.9 mEq/L (ref 3.5–5.1)
Sodium: 138 mEq/L (ref 135–145)

## 2011-01-04 LAB — GLUCOSE, CAPILLARY
Glucose-Capillary: 100 mg/dL — ABNORMAL HIGH (ref 70–99)
Glucose-Capillary: 100 mg/dL — ABNORMAL HIGH (ref 70–99)
Glucose-Capillary: 104 mg/dL — ABNORMAL HIGH (ref 70–99)
Glucose-Capillary: 116 mg/dL — ABNORMAL HIGH (ref 70–99)
Glucose-Capillary: 140 mg/dL — ABNORMAL HIGH (ref 70–99)
Glucose-Capillary: 144 mg/dL — ABNORMAL HIGH (ref 70–99)
Glucose-Capillary: 153 mg/dL — ABNORMAL HIGH (ref 70–99)
Glucose-Capillary: 54 mg/dL — ABNORMAL LOW (ref 70–99)
Glucose-Capillary: 63 mg/dL — ABNORMAL LOW (ref 70–99)
Glucose-Capillary: 78 mg/dL (ref 70–99)
Glucose-Capillary: 79 mg/dL (ref 70–99)
Glucose-Capillary: 84 mg/dL (ref 70–99)

## 2011-01-04 LAB — POCT I-STAT 3, ART BLOOD GAS (G3+)
Acid-base deficit: 2 mmol/L (ref 0.0–2.0)
Acid-base deficit: 7 mmol/L — ABNORMAL HIGH (ref 0.0–2.0)
Bicarbonate: 19.9 mEq/L — ABNORMAL LOW (ref 20.0–24.0)
Bicarbonate: 20.5 mEq/L (ref 20.0–24.0)
Bicarbonate: 22.3 mEq/L (ref 20.0–24.0)
O2 Saturation: 100 %
O2 Saturation: 84 %
O2 Saturation: 99 %
TCO2: 21 mmol/L (ref 0–100)
TCO2: 22 mmol/L (ref 0–100)
TCO2: 26 mmol/L (ref 0–100)
TCO2: 27 mmol/L (ref 0–100)
pCO2 arterial: 46.3 mmHg — ABNORMAL HIGH (ref 35.0–45.0)
pCO2 arterial: 49.6 mmHg — ABNORMAL HIGH (ref 35.0–45.0)
pH, Arterial: 7.345 — ABNORMAL LOW (ref 7.350–7.400)
pH, Arterial: 7.356 (ref 7.350–7.400)
pH, Arterial: 7.387 (ref 7.350–7.400)
pO2, Arterial: 144 mmHg — ABNORMAL HIGH (ref 80.0–100.0)
pO2, Arterial: 147 mmHg — ABNORMAL HIGH (ref 80.0–100.0)
pO2, Arterial: 403 mmHg — ABNORMAL HIGH (ref 80.0–100.0)
pO2, Arterial: 91 mmHg (ref 80.0–100.0)

## 2011-01-04 LAB — URINALYSIS, ROUTINE W REFLEX MICROSCOPIC
Glucose, UA: NEGATIVE mg/dL
Glucose, UA: NEGATIVE mg/dL
Hgb urine dipstick: NEGATIVE
Nitrite: NEGATIVE
Specific Gravity, Urine: 1.008 (ref 1.005–1.030)
Specific Gravity, Urine: 1.01 (ref 1.005–1.030)
pH: 8 (ref 5.0–8.0)

## 2011-01-04 LAB — BLOOD GAS, ARTERIAL
Acid-base deficit: 1.5 mmol/L (ref 0.0–2.0)
Drawn by: 181601
O2 Content: 2 L/min
pCO2 arterial: 34.3 mmHg — ABNORMAL LOW (ref 35.0–45.0)
pO2, Arterial: 163 mmHg — ABNORMAL HIGH (ref 80.0–100.0)

## 2011-01-04 LAB — COMPREHENSIVE METABOLIC PANEL
ALT: 20 U/L (ref 0–35)
AST: 30 U/L (ref 0–37)
Alkaline Phosphatase: 46 U/L (ref 39–117)
CO2: 22 mEq/L (ref 19–32)
Chloride: 104 mEq/L (ref 96–112)
GFR calc Af Amer: >60 mL/min (ref >60)
GFR calc non Af Amer: >60 mL/min (ref >60)
Potassium: 4.3 mEq/L (ref 3.5–5.1)
Sodium: 136 mEq/L (ref 135–145)
Total Bilirubin: 1.2 mg/dL (ref 0.3–1.2)

## 2011-01-04 LAB — PREPARE PLATELETS

## 2011-01-04 LAB — PREPARE FRESH FROZEN PLASMA

## 2011-01-04 LAB — POCT I-STAT GLUCOSE
Glucose, Bld: 87 mg/dL (ref 70–99)
Operator id: 3342

## 2011-01-04 LAB — MAGNESIUM
Magnesium: 2.1 mg/dL (ref 1.5–2.5)
Magnesium: 2.7 mg/dL — ABNORMAL HIGH (ref 1.5–2.5)

## 2011-01-04 LAB — ABO/RH: ABO/RH(D): A POS

## 2011-01-04 LAB — HEMOGLOBIN AND HEMATOCRIT, BLOOD
HCT: 23.5 % — ABNORMAL LOW (ref 36.0–46.0)
Hemoglobin: 8 g/dL — ABNORMAL LOW (ref 12.0–15.0)

## 2011-01-04 LAB — URINE CULTURE: Colony Count: 10000

## 2011-01-04 LAB — POTASSIUM: Potassium: 3.6 mEq/L (ref 3.5–5.1)

## 2011-01-04 LAB — TSH: TSH: 27.876 u[IU]/mL — ABNORMAL HIGH (ref 0.350–4.500)

## 2011-01-05 ENCOUNTER — Other Ambulatory Visit: Payer: Self-pay | Admitting: Gastroenterology

## 2011-01-05 ENCOUNTER — Ambulatory Visit (HOSPITAL_COMMUNITY)
Admission: RE | Admit: 2011-01-05 | Discharge: 2011-01-05 | Disposition: A | Discharge status: Home / Self Care | Payer: Medicare Other | Source: Ambulatory Visit | Attending: Gastroenterology | Admitting: Gastroenterology

## 2011-01-05 DIAGNOSIS — R1310 Dysphagia, unspecified: Secondary | ICD-10-CM | POA: Insufficient documentation

## 2011-01-05 DIAGNOSIS — K259 Gastric ulcer, unspecified as acute or chronic, without hemorrhage or perforation: Secondary | ICD-10-CM | POA: Insufficient documentation

## 2011-01-05 DIAGNOSIS — R12 Heartburn: Secondary | ICD-10-CM | POA: Insufficient documentation

## 2011-01-05 DIAGNOSIS — K449 Diaphragmatic hernia without obstruction or gangrene: Secondary | ICD-10-CM | POA: Insufficient documentation

## 2011-01-05 LAB — POCT I-STAT 3, ART BLOOD GAS (G3+)
Bicarbonate: 23.4 mEq/L (ref 20.0–24.0)
TCO2: 24 mmol/L (ref 0–100)
pH, Arterial: 7.454 — ABNORMAL HIGH (ref 7.350–7.400)
pO2, Arterial: 86 mmHg (ref 80.0–100.0)

## 2011-01-05 LAB — POCT I-STAT 3, VENOUS BLOOD GAS (G3P V)
Acid-base deficit: 1 mmol/L (ref 0.0–2.0)
Bicarbonate: 24.4 mEq/L — ABNORMAL HIGH (ref 20.0–24.0)
O2 Saturation: 61 %
TCO2: 26 mmol/L (ref 0–100)
pH, Ven: 7.366 — ABNORMAL HIGH (ref 7.250–7.300)

## 2011-01-09 NOTE — Op Note (Signed)
  NAME:  [name], [name] NO.:  [id number]  MEDICAL RECORD NO.:  [id number]           PATIENT TYPE:  O  LOCATION:  WLEN                         FACILITY:  [facility]  PHYSICIAN:  Danise Edge, M.D.   DATE OF BIRTH:  July 01, 1933  DATE OF PROCEDURE:  01/05/2011 DATE OF DISCHARGE:                              OPERATIVE REPORT   HISTORY:  Ms. [name] is a 77 year old female born 12-05-1932. The patient has experienced two episodes of solid food dysphagia while eating at a restaurant.  The obstructing food bolus passed spontaneously.  In February 2007, the patient's esophagogastroduodenoscopy was normal.  The patient's barium esophagram with barium tablet showed gastroesophageal reflux and distal esophageal spasm, but no esophageal obstruction.  The patient is experiencing heartburn, but denies odynophagia.  ENDOSCOPIST:  Danise Edge, M.D.  PREMEDICATION:  Fentanyl 25 mcg, Versed 2 mg.  PROCEDURE:  After obtaining informed consent, the patient was placed in the left lateral decubitus position.  The Pentax gastroscope was passed through the posterior hypopharynx into the proximal esophagus without difficulty.  The hypopharynx, larynx and vocal cords appeared normal.  Esophagoscopy:  There is a mucosal red spot in the proximal esophagus, which was biopsied.  There was no associated ulceration or erosion. Otherwise the proximal mid and lower segments of the esophageal mucosa appear completely normal.  There is no evidence for the presence of stricture formation or esophageal obstruction.  The squamocolumnar junction is noted at 40 cm from the incisor teeth.  There is no endoscopic evidence for the presence of erosive esophagitis or Barrett's esophagus.  Gastroscopy:  There is a small hiatal hernia.  Retroflexed view of the gastric cardia and fundus was normal.  The gastric body appeared normal. There is a 3-mm prepyloric gastric ulcer with exudative base  and low risk for bleeding.  The pylorus was normal.  Biopsies were taken from the ulcer, proximal gastric antrum, and gastric body.  Duodenoscopy:  The duodenal bulb and descending duodenum appeared normal.  ASSESSMENT: 1. Normal esophagus endoscopically except for the presence of a small     mucosal red spot in the cervical esophagus, which was biopsied.  No     endoscopic evidence for the presence of erosive esophagitis,     Barrett's esophagus, esophageal stricture formation or esophageal     obstruction. 2. A 3-mm prepyloric gastric antral ulcer at low risk for bleeding was     biopsied. 3. Otherwise normal esophagogastroduodenoscopy.  RECOMMENDATIONS:  I will place the patient on omeprazole 20 mg before breakfast each morning.  Await gastric biopsies to rule out Helicobacter pylori gastritis.  The patient is not taking nonsteroidal anti- inflammatory medication.          ______________________________ Danise Edge, M.D.     MJ/MEDQ  D:  01/05/2011  T:  01/05/2011  Job:  [id number]  cc:   Candyce Churn, M.D. Fax: [phone]  Electronically Signed by Danise Edge M.D. on 01/08/2011 02:27:52 PM

## 2011-01-10 LAB — POCT HEMOGLOBIN-HEMACUE: Hemoglobin: 13.3 g/dL (ref 12.0–15.0)

## 2011-01-10 LAB — BASIC METABOLIC PANEL
BUN: 12 mg/dL (ref 6–23)
Calcium: 9.6 mg/dL (ref 8.4–10.5)
Creatinine, Ser: 0.97 mg/dL (ref 0.4–1.2)
GFR calc Af Amer: >60 mL/min (ref >60)
GFR calc non Af Amer: 56 mL/min — ABNORMAL LOW (ref >60)

## 2011-02-07 NOTE — Assessment & Plan Note (Signed)
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  She was last seen here in the  office on Feb 08, 2009.  Since then, she has done fairly well overall.  She just recently completed the cardiac rehab program, and she tells me  that she plans to start a pulmonary rehab program in the near future.  She has not had any shortness of breath, PND, orthopnea, nor lower  extremity edema to suggest problems with congestive heart failure.  She  has not had any tachy palpitations or dizzy spells.  She has not had any  chest pain.  She states that her sternal incision has healed completely  and she no longer has any soreness.  She still feels tired, but she  admits that this has been that way for years.  Her voice has improved a  little bit, but she still remains hoarse.  She understands that it may  take several more months for this to gradually get better.  Overall, she  has no complaints.  She is not certain exactly what medications she is  taking at this time, and she did not bring her current medication list  with her to the office today.  She is specifically uncertain as to  whether or not she remains on amiodarone.  She has not been back to see  Dr. Anne Fu within the last few weeks, but she has an appointment  scheduled to see him sometime later this fall.  To her knowledge, she  has not had a followup echocardiogram performed yet.   REVIEW OF SYSTEMS:  The remainder of her review of systems is  unremarkable.   PAST MEDICAL HISTORY:  The remainder of past medical history is  unchanged.   PHYSICAL EXAMINATION:  Notable for a well-appearing, but frail female  who appears her stated age in no acute  distress.  Blood pressure 130/62,  pulse 69 and regular, oxygen saturation 98% on room air.  Examination of  the chest reveals that her median sternotomy scar has healed nicely and  the sternum is stable.  Breath sounds are clear to auscultation and  symmetrical bilaterally.  No wheezes or rhonchi are noted.  Cardiovascular exam includes regular rate and rhythm.  No murmurs, rubs,  or gallops are noted.  The abdomen is soft, nontender.  The extremities  are warm and well perfused.  There is no lower extremity edema.   IMPRESSION:  The patient appears to be doing fairly well from a cardiac  standpoint.  She is not certain what medications she is taking at this  time.  To her knowledge and recollection, she has not yet had a followup  echocardiogram performed, but she is scheduled to see Dr. Anne Fu later  this fall.   PLAN:  In the future, the patient will call and return to see [place] here at  [facility] only should further problems or  difficulties arise.  [name] would be interested to see a followup  echocardiogram to check  the status of her mitral valve repair and to see  if her LV function has improved any.  She does not have a murmur on  physical exam, and she is certainly not having any problems with  congestive heart failure.  All of her questions have been addressed.   Salvatore Decent. Cornelius Moras, M.D.  Electronically Signed   CHO/MEDQ  D:  05/17/2009  T:  05/18/2009  Job:  [id number]   cc:   Jake Bathe, MD  Candyce Churn, M.D.

## 2011-02-07 NOTE — Discharge Summary (Signed)
NAMEALZADA, BRAZEE                 ACCOUNT NO.:  [id number]   MEDICAL RECORD NO.:  [id number]          PATIENT TYPE:  INP   LOCATION:  2029                         FACILITY:  [facility]   PHYSICIAN:  Salvatore Decent. Cornelius Moras, M.D. DATE OF BIRTH:  01/10/1933   DATE OF ADMISSION:  01/04/2009  DATE OF DISCHARGE:                               DISCHARGE SUMMARY   ADDENDUM:  This is an addendum to a previously dictated discharge  summary.  Ms. Hodkinson was originally scheduled for discharge to [facility] on January 14, 2009.  At the time of that evaluation, she was noted  to have a white blood cell count of 12,000 with an enlarging right  pleural effusion.  She was complaining of some shortness of breath.  It  was felt that she should undergo an ultrasound-guided right  thoracentesis.  This was performed later down on January 14, 2009.  Approximately 1 liter of serosanguineous fluid was removed giving the  patient immediate relief.  Her followup chest x-ray shows improvement  following thoracentesis.  She continued to have intermittent runs of  atrial fibrillation, which converted spontaneously back to normal sinus  rhythm.  However, her white blood cell count continued to increase at  peak of 15,000 on January 16, 2009.  She remained afebrile with no  evidence of infection on physical exam.  Her incisions remained clean  and dry without erythema or drainage.  Her followup chest x-ray was  significantly improved.  A urinalysis was performed which was negative.  She was closely monitored and her white blood cell count began to trend  downward and on January 17, 2009, was found to 14,000.  Also during this  time, her TSH was rechecked and had increased to 27.876.  Because of her  history of Graves disease and current amiodarone therapy, her Synthroid  was increased to 100 mcg daily.  Since that time, she has had no further  episodes of atrial fibrillation.  However, she has had some bradycardia  with heart rates  down to the 50s.  She has been evaluated by Dr.  Eldridge Dace and her medications have remained unchanged.  She has otherwise  been stable during this interim.  Her labs on postop day 13, show  hemoglobin of 10.3, hematocrit 30.8, platelets 375, white count 14.7.  Sodium 133, potassium 4.0, BUN 23, creatinine 1.07.  Her anticoagulation  has been ongoing and her current PT is 25.4 with an INR of 2.2.  She  continues to ambulate well with cardiac rehab phase 1.  Her incisions  are healing well and every other chest suture has been removed.  She  will undergo a repeat CBC on the morning of January 18, 2009.  It is  anticipated that if her leukocytosis has continued to improve and she is  otherwise doing well, then she will hopefully be ready for transfer to  the [facility] at that time.  Discharge instructions are unchanged  from the previously dictated discharge summary.  Discharge medications  are also unchanged from the previously dictated discharge summary with  the exception of  Coumadin, which will be dosed at the time of discharge  based on her protime.  Also, we have added Synthroid 100 mcg daily to  her medication regimen.  She will follow up as previously indicated with  Dr. Anne Fu and with Dr. Cornelius Moras.  Our office may be contacted if any  further problems arise.      Coral Ceo, P.A.      Salvatore Decent. Cornelius Moras, M.D.  Electronically Signed   GC/MEDQ  D:  01/17/2009  T:  01/17/2009  Job:  [id number]   cc:   Jake Bathe, MD

## 2011-02-07 NOTE — Op Note (Signed)
[name], [name]                 ACCOUNT NO.:  [id number]   MEDICAL RECORD NO.:  [id number]          PATIENT TYPE:  AMB   LOCATION:  DSC                          FACILITY:  [facility]   PHYSICIAN:  Cindee Salt, M.D.       DATE OF BIRTH:  1933/07/30   DATE OF PROCEDURE:  10/29/2008  DATE OF DISCHARGE:                               OPERATIVE REPORT   PREOPERATIVE DIAGNOSIS:  Stenosing tenosynovitis, locked left thumb.   POSTOPERATIVE DIAGNOSIS:  Stenosing tenosynovitis, locked left thumb.   OPERATION:  Release of A1 pulley, left thumb.   SURGEON:  Cindee Salt, MD   ASSISTANT:  Carolyne Fiscal, RN   ANESTHESIA:  Forearm-based IV regional.   DATE OF OPERATION:  October 29, 2008.   ANESTHESIOLOGIST:  Bedelia Person, MD   HISTORY:  The patient is a 77 year old female with a long history of  problems with her hands and recently developing a locked trigger thumb  on her left side.  This has not responded to conservative treatment.  She has elected to undergo surgical release of the A1 pulley.  Pre and  postoperative course has been discussed along with risks and  complications.  She is aware that there is no guarantee with surgery,  possibility of infection, recurrence, injury to arteries, nerves, and  tendons, incomplete relief of symptoms, and dystrophy.  In the  preoperative area, the patient is seen.  The extremity marked by both  the patient and surgeon.  Antibiotic given.   PROCEDURE:  The patient is brought to the operating room where a forearm-  based IV regional anesthetic was carried out without difficulty under  direction of Dr. Gypsy Balsam.  She was prepped using DuraPrep, supine  position, left arm free.  A time-out was taken.  Following this, a  transverse incision was made over the A1 pulley of the left thumb and  carried down through subcutaneous tissue.  Neurovascular structures were  identified and protected.  Retractor was placed.  The A1 pulley was  found to be extremely tight with a  large nodule present just proximal to  this.  A scissors was unable to be placed between the pulley and the  flexor tendon.  An incision was then made on the ulnar aspect using  sharp scalpel through the A1 pulley only.  No damage was done to the  flexor tendon.  The A1 pulley was released in its radial aspect to the  radial nerve and the oblique portion of the pulley.  The thumb was  easily able to be placed through full range motion.  Area of compression  to the tendon was immediately apparent.  No further lesions were  identified.  The wound was copiously irrigated with saline and the skin  was closed with interrupted 5-0 Vicryl Rapide sutures.  The area was  then locally infiltrated without 0.25% Marcaine without epinephrine, 2  mL was  used.  A sterile compressive dressing was applied.  The patient  tolerated the procedure well and was taken to the recovery room for  observation in satisfactory condition.  She will be  discharged to home  to return to [facility] [place] in 1 week on Tylenol 3 at her  request.           ______________________________  Cindee Salt, M.D.     GK/MEDQ  D:  10/29/2008  T:  10/29/2008  Job:  [id number]   cc:   Candyce Churn, M.D.

## 2011-02-07 NOTE — Op Note (Signed)
[name], [name]                 ACCOUNT NO.:  [id number]   MEDICAL RECORD NO.:  [id number]          PATIENT TYPE:  AMB   LOCATION:  DSC                          FACILITY:  [facility]   PHYSICIAN:  Cindee Salt, M.D.       DATE OF BIRTH:  1933/04/29   DATE OF PROCEDURE:  05/29/2007  DATE OF DISCHARGE:                               OPERATIVE REPORT   PREOPERATIVE DIAGNOSIS:  Carpometacarpal arthritis, left thumb.   POSTOPERATIVE DIAGNOSIS:  Carpometacarpal arthritis, left thumb.   OPERATION:  Partial excision trapezium with Artelon prosthetic  replacement, left thumb.   SURGEON:  Cindee Salt, M.D.   ASSISTANT:  Carolyne Fiscal R.N.   ANESTHESIA:  Axillary block.   HISTORY:  The patient is a 77 year old female with a history of CMC  arthritis not responsive to conservative treatment.  She has elected to  undergo Artelon prosthetic replacement.  She is aware of risks and  complications including infection, recurrence, injury to arteries,  nerves, tendons, incomplete relief of symptoms, dystrophy.  Questions  have been encouraged and answered.  In the preoperative area the patient  is seen, antibiotic given, the extremity marked by both the patient and  surgeon, questions again encouraged and answered to her satisfaction.   PROCEDURE:  The patient was brought to the operating room where the  axillary block was carried out without difficulty.  She was prepped  using DuraPrep, supine position, left arm free.  The limb was  exsanguinated with an Esmarch bandage.  Tourniquet placed high on the  arm, was inflated to 250 mg mmHg then elevated to 300 after incision was  made and breakthrough bleeding was noted.  Bleeders were  electrocauterized.  Radial sensory nerve identified and protected.  The  interval between the extensor pollicis brevis, abductor pollicis longus  was opened.  An incision was made through the swollen joint capsule  identifying the carpometacarpal joint.  A flap of tissue of  periosteum  and capsule was then elevated with it being distally based on the  metacarpal of the thumb.  The trapezium was identified.  The radial  artery identified and protected with retractors.  A regular size Artelon  prosthesis was then soaked for 10 minutes.  An oscillating saw was then  used to remove the distal aspect of the trapezium just in the  subchondral bone area.  This was removed.  Significant degenerative  changes.  A synovectomy performed.  The area was irrigated.  The  oscillating saw was then used to remove a cortical window from the  trapezium and from the metacarpal after measuring the Artelon  prosthesis.  Drill holes were placed for suturing the prosthesis in  position.  The prosthesis was then placed after irrigation into the  carpometacarpal space.  This was then stabilized with 4-0 FiberWire  through drill holes in both the trapezium and in the metacarpal.  X-rays  confirmed positioning of the prosthesis, resection of the bone and good  CMC space.  The flap of tissue that periosteum and capsule was then  sutured into position with interrupted 4-0 Vicryl sutures.  The  subcutaneous tissue closed with interrupted 4-0 Vicryl and skin with  interrupted 5-0 Vicryl Rapide sutures.  The thumb lied in good position.  There  was a hyperextension of the metacarpophalangeal joint.  A sterile  compressive dressing and splint applied.  The patient tolerated the  procedure well and was taken to the recovery room for observation in  satisfactory condition.  She is admitted for overnight stay.  She will  be discharged on Percocet.           ______________________________  Cindee Salt, M.D.     GK/MEDQ  D:  05/29/2007  T:  05/29/2007  Job:  [id number]   cc:   Candyce Churn, M.D.

## 2011-02-07 NOTE — Op Note (Signed)
NAMEPRUDY, [name]                 ACCOUNT NO.:  [id number]   MEDICAL RECORD NO.:  [id number]          PATIENT TYPE:  AMB   LOCATION:  DSC                          FACILITY:  [facility]   PHYSICIAN:  Cindee Salt, M.D.       DATE OF BIRTH:  [birth year]   DATE OF PROCEDURE:  09/03/2008  DATE OF DISCHARGE:                               OPERATIVE REPORT   PREOPERATIVE DIAGNOSIS:  Stenosing tenosynovitis, right thumb.  Pisotriquetral arthritis, right wrist.   POSTOPERATIVE DIAGNOSIS:  Stenosing tenosynovitis, right thumb.  Pisotriquetral arthritis, right wrist.   OPERATION:  Release of A1 pulley, right thumb with an injection of  pisotriquetral joint, right wrist.   SURGEON:  Cindee Salt, MD   ASSISTANT:  Carolyne Fiscal RN   ANESTHESIA:  Forearm-based IV regional.   DATE OF OPERATION:  September 03, 2008   ANESTHESIOLOGIST:  Zenon Mayo, MD   HISTORY:  The patient is a 77 year old female with history of triggering  of her right thumb.  This has not responded to conservative treatment.  She has a palpable mass.  She is advised that this may be a cyst or  maybe thickening of the A1 pulley.  If it is a cyst, we will plan on  excision.  She has pisotriquetral arthritis, this will be injected at  the same time.  She is aware there is no guarantee with surgery,  possibility of infection, recurrence, injury to arteries, nerves, and  tendons, incomplete relief of symptoms, and dystrophy.  In the  preoperative area, the patient is seen.  The extremity marked by both  the patient and surgeon.   PROCEDURE:  The patient is brought to the operating room where forearm-  based IV regional anesthetic was carried out without difficulty.  She  was prepped using DuraPrep, supine position, right arm free.  After  adequate anesthesia was afforded, the pisotriquetral joint was cleansed  with alcohol and an injection into pisotriquetral joint was then  performed using 1% Xylocaine three-quarters of a  milliliter and  Celestone one-quarter milliliter.  This was done without difficulty.  She was then prepped using DuraPrep, supine position, right arm free.  A  time-out was taken prior to the injection confirming the patient's  procedure.  A tourniquet had been inflated __________ anesthesia for the  injection.  A transverse incision was made over the A1 pulley of the  right thumb, carried down through subcutaneous tissue.  Neurovascular  structures identified and protected.  Retractors were placed.  The A1  pulley was then released in its radial aspect.  The oblique pulley was  left intact.  Thumb was placed through a full range motion, no further  triggering was noted.  The wound was irrigated.  The skin closed with  interrupted 5-0 Vicryl Rapide sutures.  It was noted no cyst was present  on the A1 pulley.  The A1 pulley, however, was thick  providing the mass.  A sterile compressive dressing was applied.  The  patient tolerated the procedure well and was taken to the recovery room  for observation in satisfactory  condition.  She will be discharged home  to return to [facility] [place] in 1 week on Darvocet-N 100.           ______________________________  Cindee Salt, M.D.     GK/MEDQ  D:  09/03/2008  T:  09/03/2008  Job:  [id number]

## 2011-02-07 NOTE — Discharge Summary (Signed)
NAMETRISTINA, SAHAGIAN                 ACCOUNT NO.:  [id number]   MEDICAL RECORD NO.:  [id number]          PATIENT TYPE:  INP   LOCATION:  5528                         FACILITY:  [facility]   PHYSICIAN:  Jake Bathe, MD      DATE OF BIRTH:  1933-01-09   DATE OF ADMISSION:  04/02/2009  DATE OF DISCHARGE:  04/05/2009                               DISCHARGE SUMMARY   DISCHARGE DIAGNOSES:  1. Possible infection to the implantable cardioverter-defibrillator      pocket.  2. Status post implantable cardioverter-defibrillator.  3. Ischemic cardiomyopathy, ejection fraction 15% to 20%.  4. Dyslipidemia.  5. Status post mitral valve repair with a CorMatrix patch with a 26 mm      Edwards Physio ring annuloplasty along with coronary artery bypass      graft with a left internal mammary artery to the left anterior      descending, saphenous vein graft to diagonal.  6. Chronic obstructive pulmonary disease.  7. Graves disease, status post iodine ablation.  8. Raynaud's.  9. Fibromuscular dysplasia.  10.Degenerative joint disease.  11.Scoliosis.   HOSPITAL COURSE:  Ms. Mcfadden is a 77 year old female who is just  discharged from the hospital after having a St. Jude ICD implanted by  Dr. Amil Amen.  She had to go that same day back to the OR for evacuation  of a hematoma.  She was discharged the day prior to this admission.  One  day after discharge, she noticed increased pain over the wound site and  down into the left arm.  She had a temperature of 98, which she says 2  degrees higher than normal.  Her husband also noticed some slight  erythema over the site as well.  They called Dr. Anne Fu who was on-call  and he instructed the patient to come to the emergency room for  vancomycin.  She was treated with vancomycin for 2 days along with her  other medications.  She is being switched to Zyvox upon discharge.   LABORATORY STUDIES:  Normal white count of 5.4, hemoglobin is 9.0,  chronic anemia, with a  hematocrit of 27.4, and platelets 164.  Routine  blood cultures drawn on April 03, 2009, are negative so far but will need  to be checked in the office tomorrow.  Her protime was 15.8 with an INR  of 1.2.  She had been on this for some postoperative atrial fibrillation  during her valve surgery and says she has had no further AFib, we want  to stop the Coumadin.   She is being discharged home today in stable, but improved condition.   ACTIVITY AND WOUND CARE:  Activity and wound care sheets were all  provided at her last discharge and they still remain as before.  She is  to remain on a low-sodium, heart-healthy diet.  Keep her appointment  with Dr. Caryl Never, nurse practitioner tomorrow.   MEDICATIONS:  Unchanged that include:  1. Carvedilol 6.25 mg every 12 hours.  2. Baby aspirin 81 mg a day.  3. Singulair 10 mg a day.  4.  Synthroid 75 mcg a day.  5. Lasix 20 mg a day.  6. Omeprazole 20 mg a day.  7. Simvastatin 40 mg a day.  8. Nortriptyline 50 mg 1 p.o. at bedtime.  9. Diovan 80 mg a day.  10.She may resume Tylenol No. 3 as before.  11.She is to stop her Coumadin.   NEW MEDICATIONS:  Zyvox 600 mg p.o. b.i.d. for 10 days. She has had a  rash in the past with Cipro. If cost is an issue, I will trial Levaquin  and monitor closely.      Guy Franco, P.A.      Jake Bathe, MD  Electronically Signed    LB/MEDQ  D:  04/05/2009  T:  04/06/2009  Job:  [id number]   cc:   Francisca December, M.D.  Candyce Churn, M.D.

## 2011-02-07 NOTE — Assessment & Plan Note (Signed)
OFFICE VISIT   KAMEE, BOBST I  DOB:  1932-10-28                                        Feb 08, 2009  CHART #:  [id number]   The patient returns for routine followup, status post mitral valve  repair and coronary artery bypass grafting x2 on January 04, 2009.  Her  inpatient postoperative recovery was slow, but overall uncomplicated.  Following hospital discharge, the patient has continued to gradually  improve.  She plans to see Dr. Anne Fu for followup in the office later  this week.  Her Coumadin dose is being monitored and adjusted through  his office.  Overall, she has had no problems.  Her exercise tolerance  has slowly improved.  She still gets short of breath when she is  walking, and she is frustrated that her exercise tolerance has not  improved faster.  She denies any resting shortness of breath, PND,  orthopnea, or lower extremity edema.  She has had minimal pain if any,  and she is not requiring any pain medication.  She is sleeping well at  night.  Overall, she has no complaints.   PHYSICAL EXAMINATION:  GENERAL:  A well-appearing thin female.  VITAL SIGNS:  Blood pressure 121/68, pulse 54, and oxygen saturation  100% on room air.  CHEST:  A median sternotomy incision that is healing nicely.  The  sternum is stable on palpation.  LUNGS:  Breath sounds are clear to auscultation and symmetrical  bilaterally.  No wheezes or rhonchi are noted.  CARDIOVASCULAR:  Regular rate and rhythm.  No murmurs, rubs, or gallops  are appreciated.  ABDOMEN:  Soft and nontender.  EXTREMITIES:  Warm and well perfused.  There is no lower extremity  edema.  The small incision from endoscopic vein harvest in the right  lower leg has healed well.  The remainder of physical exam is  noncontributory.   DIAGNOSTIC TEST:  Chest x-ray obtained today at the [facility] is reviewed.  This demonstrates clear lung fields bilaterally  with no significant pleural  effusions.  All the sternal wires appear  intact.  No other abnormalities are noted.   IMPRESSION:  Overall, the patient has recovered remarkably well given  the presence of significant left ventricular dysfunction prior to  surgery.  Clinically she is slowly improving.  She has had no  significant setbacks or complications.  We would expect her exercise  tolerance to improve slowly, and she will undoubtedly be left with some  degree of left ventricular dysfunction that may impact her long-term  course regardless.  However, she has not had any problems with  congestive heart failure since hospital discharge.   PLAN:  I have encouraged the patient to continue to gradually increase  her physical activity as tolerated with her only limitation at this  point remaining that she refrain from heavy lifting or strenuous use of  her arms or shoulders.  I have encouraged her to try getting enrolled in  cardiac rehab program.  We have not made any changes in the current  medications.  We will plan to see her back in 3 months' time.  At some  point, it would be reasonable to get a followup echocardiogram to  reassess her mitral valve repair and in particular her degree of left  ventricular dysfunction.  All of  her questions have been addressed.   Salvatore Decent. Cornelius Moras, M.D.  Electronically Signed   CHO/MEDQ  D:  02/08/2009  T:  02/09/2009  Job:  [id number]   cc:   Jake Bathe, MD  Candyce Churn, M.D.

## 2011-02-07 NOTE — H&P (Signed)
NAME:  [name], [name] NO.:  [id number]   MEDICAL RECORD NO.:  [id number]          PATIENT TYPE:  INP   LOCATION:  5528                         FACILITY:  [facility]   PHYSICIAN:  Rollene Rotunda, MD, FACCDATE OF BIRTH:  [birth year]   DATE OF ADMISSION:  04/03/2009  DATE OF DISCHARGE:                              HISTORY & PHYSICAL   PRIMARY CARE PHYSICIAN:  Candyce Churn, MD.   CARDIOLOGIST:  Jake Bathe, MD.   REASON FOR PRESENTATION:  Evaluate patient status post ICD placement.  She has pain and erythema over the ICD site.   HISTORY OF PRESENT ILLNESS:  The patient is a very pleasant 77 year old  white female with a history of cardiomyopathy.  She had ICD placed on  Wednesday of this week.  That same day, she had to go for an evacuation  of a hematoma.  She was discharged yesterday.  However, yesterday  (Friday),  she noted increased pain over the wound site and down into  the left arm.  She had a temperature of 98 which she says is 2 degrees  higher than her normal and a fever for me.  Her husband also noted  some slight erythema over the site as well.  There was no oozing.  There  did not appear to be any enlarging or expanding hematoma.  She called  Dr. Anne Fu who instructed the patient to come to the emergency room for  vancomycin.  She is allergic to very many antibiotics.  She has not had  any shaking chills.  She has not had any new shortness of breath and  denies any PND or orthopnea.  She has not had any palpitations,  presyncope or syncope.  She has had no chest pressure.   PAST MEDICAL HISTORY:  1. Status post St. Jude ICD/CRT by Dr. Amil Amen, cardiomyopathy (EF 25-      30%), mitral regurgitation (status post mitral valve repair with a      core matrix patch with 26 mm Edwards physio ring annuloplasty.  She      also had CABG with a LIMA to the LAD and SVG to diagonal.)  2. Hypertension.  3. Hyperlipidemia.  4. COPD.  5. Graves disease  (status post iodine ablation).  6. Raynaud's.  7. Fibromuscular dysplasia.  8. Degenerative joint disease.  9. Scoliosis.  10.Degenerative disk disease.   PAST SURGICAL HISTORY:  1. Mitral valve surgery and CABG as above.  2. Tonsillectomy.  3. Appendectomy.  4. Hemorrhoidectomy.  5. Hysterectomy.  6. Breast biopsy.  7. Sinus surgery.  8. Bilateral rotator cuff surgery.  9. Lumbar laminectomy.  10.Cervical fusion.  11.Umbilical hernia repair.  12.Wedge resection for the right upper lobe for Aspergillus treatment.  13.PTCA of bilateral renal arteries.  14.Sinus surgeries.  15.Cataract extraction.  16.Carpal tunnel surgery on the left and the right.  17.Trigger finger release x2.  18.Left thumb surgery.   ALLERGIES:  1. SULFA.  2. PENICILLIN.  3. ERYTHROMYCIN.  4. AMITRIPTYLINE.  5. BACTRIM.  6. CEFTIN.  7. CIPRO.  8. DOXYCYCLINE.  9. FULVICIN.  10.HYDROCODONE.  11.KETOCONAZOLE.  12.KLONOPIN.  13.LORCET.  14.NIZORAL.  15.PERCODAN.  16.SEREVENT.  17.DAPTAZOLE.  18.TROVAN.  19.VENTOLIN.  20.XANAX.  21.KEFLEX.  22.MIACALCIN NASAL SPRAY.  23.HYDROMORPHONE.   MEDICATIONS:  (The patient did not have a complete list).  1. Fosamax.  2. Fluconazole 199 mg weekly.  3. Singulair.  4. Levoxyl 0.75 mg daily.  5. Lasix 20 mg daily.  6. Omeprazole 20 mg daily.  7. Potassium.  8. Nortriptyline 50 mg q.h.s.  9. Zocor 40 mg daily.  10.Multivitamin.   SOCIAL HISTORY:  Patient is married.  She does not smoke cigarettes.  She does not drink alcohol.   FAMILY HISTORY:  Noncontributory.   REVIEW OF SYSTEMS:  As stated in the HPI, otherwise negative for other  systems.   PHYSICAL EXAMINATION:  GENERAL:  The patient is pleasant and in no  distress.  VITAL SIGNS:  Blood pressure 153/77, heart rate 72 and regular,  respiratory rate 16, afebrile.  HEENT:  Eye are unremarkable.  Pupils are equal, round and reactive to  light.  Fundi not visualized.  Oral mucosa  unremarkable, edentulous.  NECK:  No jugular venous distention at 45 degrees.  Carotid upstroke  brisk and symmetrical.  No bruits.  No thyromegaly.  LUNGS:  Few basilar coarse crackles, no fine crackles, no wheezing.  BACK:  No costovertebral angle tenderness.  CHEST:  The left upper ICD pocket has no exudate.  There is soft  probably mild hematoma.  There is mild surrounding erythema and  ecchymosis.  HEART:  PMI not displaced or sustained, S1-S2 within normal limits, no  S3, no S4, 2/6 apical systolic murmur early peaking and radiating  slightly at the aortic outflow tract.  No diastolic murmurs.  ABDOMEN:  Flat, positive bowel sounds.  Normal in frequency and pitch.  No bruits, rebound, guarding or midline pulsatile mass.  No  hepatomegaly.  No splenomegaly.  SKIN:  No rashes.  No nodules.  EXTREMITIES:  2+ pulses throughout.  No edema, cyanosis or clubbing.  NEURO:  Oriented to person, place, time.  Cranial nerves II-XII are  grossly intact.  Motor grossly intact throughout.   LABORATORY DATA:  WBC 7.4, hemoglobin 9.0, platelets 142 (other labs  pending).   DIAGNOSTICS:  Chest x-ray (preliminary) no acute disease.   ASSESSMENT/PLAN:  1. Status post implantable cardioverter-defibrillator implant.  The      patient does have some mild erythema.  I will draw blood cultures.      She is allergic to many antibiotics.  The instruction per Dr.      Anne Fu was vancomycin which I will begin at 500 mg b.i.d.  Further      therapy will be decided based on her improvement or any objective      evidence of infection going forward.  I will treat her with codeine      and acetaminophen for pain.  2. Cardiomyopathy.  She will continue the meds as listed.  She sounds      euvolemic.  3. Hypothyroidism.  I will continue her meds as listed.  4. Chronic Coumadin therapy.  Apparently the patient has been on this      since valve replacement.  She may be getting close to the time when      this  can be discontinued, but I will continue it for now.  5. Other:  She should bring her medications so we can clarify her      list.  I will continue the  meds as I understand them.      Rollene Rotunda, MD, [facility]  Electronically Signed     JH/MEDQ  D:  04/03/2009  T:  04/03/2009  Job:  [id number]   cc:   Jake Bathe, MD

## 2011-02-07 NOTE — Op Note (Signed)
NAMEJORDYAN, [name]                 ACCOUNT NO.:  [id number]   MEDICAL RECORD NO.:  [id number]          PATIENT TYPE:  AMB   LOCATION:  DSC                          FACILITY:  [facility]   PHYSICIAN:  Cindee Salt, M.D.       DATE OF BIRTH:  April 14, 1933   DATE OF PROCEDURE:  03/05/2008  DATE OF DISCHARGE:                               OPERATIVE REPORT   PREOPERATIVE DIAGNOSES:  1. Carpal tunnel syndrome, right hand.  2. Cubital tunnel, right elbow.  3. Stenosing tenosynovitis, right index and right ring fingers.   POSTOPERATIVE DIAGNOSES:  1. Carpal tunnel syndrome, right hand.  2. Cubital tunnel, right elbow.  3. Stenosing tenosynovitis, right index and right ring fingers.   OPERATION:  1. Release, right carpal canal.  2. Decompression, right ulnar nerve at the elbow.  3. Release, A1 pulley, right index and right ring fingers.   SURGEON:  Cindee Salt, M.D.   ASSISTANTManuela Neptune, R.N.   ANESTHESIA:  Infraclavicular block.   ANESTHESIOLOGIST:  Zenon Mayo, MD.   HISTORY:  The patient is a 77 year old female with a history of  triggering of her right index and right ring fingers.  EMG and nerve  conductions positive for carpal tunnel syndrome and ulnar neuropathy at  her elbow with symptoms of each not responsive to conservative  treatment.  She has elected to undergo surgical decompression of each of  the structures.  She is aware of risks and complications including  infection, recurrence, injury to arteries, nerves, and tendons,  incomplete relief of symptoms, and dystrophy.  Preoperative area, the  patient is seen; questions encouraged and answered; the extremity marked  by both the patient and surgeon.   PROCEDURE:  The patient is brought to the operating room where an  infraclavicular block was given.  Antibiotics had been given in the  preoperative area within 1 hour.  A time-out was performed.  She was  prepped using DuraPrep, supine position, right arm free.  The  limb was  exsanguinated with an Esmarch bandage, tourniquet placed high on the arm  was inflated to 250 mmHg.  An oblique incision was made over the  metacarpophalangeal joint crease of the index finger, carried down  through subcutaneous tissue.  Neurovascular structures identified and  protected.  The bleeders electrocauterized with bipolar.  The A1 pulley  was identified at the radial side.  An incision was made releasing the  flexor superficialis profundus.  Small incision was made centrally in  the A2 pulley.  A partial tenosynovectomy was performed in a nonadherent  tenosynovium.  The wound was irrigated, the finger placed through full  range motion, no further triggering was identified.  The wound was  irrigated and closed with an interrupted 5-0 Vicryl Rapide sutures.  A  separate incision was then made over the ring finger, again oblique in  nature, carried down through subcutaneous tissue.  Neurovascular  structures were again protected similar to the index finger.  The A1  pulley and A2 pulley were partially released.  A partial synovectomy  performed.  No further triggering  was evident.  The wound irrigated and  closed with interrupted 5-0 Vicryl Rapide.  Separate incision was then  made longitudinally in the palm and carried down through subcutaneous  tissue, bleeders again electrocauterized, palmar fascia split,  superficial palmar arch identified, flexor tendon of the ring and little  finger identified to the ulnar side of the median nerve.  The carpal  retinaculum was incised with sharp dissection and right angle and Sewall  retractor placed between skin and forearm fascia.  The fascia was  released at approximately 1.5 cm proximal to the wrist crease under  direct vision.  Canal was explored.  Tenosynovial tissue was thick, no  further lesions were identified and the area of compression of the nerve  was apparent.  This wound was irrigated and skin closed with  interrupted  5-0 Vicryl Rapide.  A separate incision was then made over the medial  epicondyle, carried down through subcutaneous tissue.  The bleeders  again electrocauterized with bipolar.  The posterior branch of the  medial antebrachial cutaneous nerve of the forearm was identified, this  was protected, an incision was then made down to the ulnar nerve.  This  was released to the arcade of Struthers and a fasciotomy performed to  the flexor carpi ulnaris.  A partial neurolysis was performed.  No areas  of further compression were identified.  The wound was irrigated, the  elbow placed through full flexion and extension.  No subluxation of the  nerve was noted.  The wound was irrigated.  The subcutaneous tissue was  closed with interrupted 4-0 Vicryl and the skin with a subcuticular 4-0  Vicryl Rapide sutures.  A sterile compressive dressing and long-arm  splint with the elbow flexed 20 degrees with the fingers free was  applied.  The patient tolerated the procedure well and was taken to the  recovery room for observation in satisfactory condition.  On deflation  of tourniquet, all fingers immediately pinked.  She will be discharged  home to return to the [facility] [place] in 1 week on [place] [facility].           ______________________________  Cindee Salt, M.D.     GK/MEDQ  D:  03/05/2008  T:  03/06/2008  Job:  [id number]

## 2011-02-07 NOTE — Assessment & Plan Note (Signed)
OFFICE VISIT   [name], [name]  DOB:  Mar 05, 1933                                        December 28, 2008  CHART #:  [id number]   HISTORY OF PRESENT ILLNESS:  The patient returns to the office today for  further followup of severe mitral regurgitation.  She was originally  seen in consultation on December 14, 2008.  Since then, she underwent  transesophageal echocardiogram by Dr. Donato Schultz.  This confirmed the  presence of severe mitral regurgitation.  There suggestion of some  prolapse of the anterior leaflet but perhaps more so restriction of the  posterior leaflet.  The mitral regurgitation jet was central but  directed somewhat eccentrically posteriorly.  There was significant left  ventricular systolic dysfunction with echocardiogram ejection fraction  estimated only 20-25%.  No other significant abnormalities were noted.   Left and right heart catheterization was performed on March 25 by Dr.  Anne Fu.  This was notable for the presence of severe left ventricular  dysfunction with ejection fraction estimated 20-25%.  There is severe  mitral regurgitation.  There is moderate pulmonary hypertension with PA  pressures measured 49/28 and pulmonary capillary wedge pressure 27.  Cardiac output was 2.2 L per minute by thermodilution, 2.7 L per minute  using the Fick method.  These correspond to cardiac index of 1.5-1.9.  Next, venous saturation was 61%.  There was significant single-vessel  coronary artery disease.  In particular, there is long segment 80%  stenosis of proximal left anterior descending coronary artery with 80%  proximal stenosis of first diagonal branch.  There was no significant  stenosis in the left circumflex or right coronary artery territories.   [name] reviewed the results of these tests with the patient and her husband  in the office today.  We tentatively plan to proceed with elective  mitral valve repair and coronary artery bypass grafting on  Friday April  9.  They understand that the risks of surgery will be slightly increased  due to the patient's underlying significant left ventricular dysfunction  as well as her chronic multiple medical problems.  Based upon review of  transesophageal echocardiogram, [name] am still hopeful that her valve may be  repairable, but [name] am concerned by the restricted nature of the posterior  leaflet, and it is possible that valve replacement will be necessary.  We discussed alternatives if valve replacement is in fact required, and  the patient specifically requests that we use a bioprosthetic tissue  valve in an effort to avoid the need for long-term anticoagulation with  Coumadin.  She understands that this would come with some risk of late  structural valve deterioration and failure depending upon her longevity.  Under the circumstances with her age and other medical problems, [name]  suspect that a tissue valve would be appropriate.  They understand and  accept all potential associated risks of surgery including but not  limited to risk of death, stroke, myocardial infarction, congestive  heart failure, respiratory failure, pneumonia, bleeding requiring blood  transfusion, arrhythmia, heart block, bradycardia requiring permanent  pacemaker, recurrent congestive heart failure, recurrent coronary artery  disease.  Risk of need for long-term pacemaker may be increased due to  the presence of preoperative left  bundle-branch block noted on previous electrocardiograms.  All of their  questions have been addressed.  Salvatore Decent. Cornelius Moras, M.D.  Electronically Signed   CHO/MEDQ  D:  12/28/2008  T:  12/29/2008  Job:  [id number]   cc:   Jake Bathe, MD  Candyce Churn, M.D.

## 2011-02-07 NOTE — Cardiovascular Report (Signed)
[name], [name]                 ACCOUNT NO.:  [id number]   MEDICAL RECORD NO.:  [id number]          PATIENT TYPE:  OIB   LOCATION:  1963                         FACILITY:  [facility]   PHYSICIAN:  Jake Bathe, MD      DATE OF BIRTH:  10/19/1932   DATE OF PROCEDURE:  12/17/2008  DATE OF DISCHARGE:  12/17/2008                            CARDIAC CATHETERIZATION   PROCEDURES:  1. Left heart catheterization.  2. Selective coronary angiography.  3. Left ventriculogram.  4. Right heart catheterization with cardiac outputs and saturations      obtained.   INDICATIONS FOR PROCEDURE:  Cardiomyopathy and severe mitral  regurgitation.   PROCEDURE DETAILS:  Informed consent was obtained.  Risk of stroke,  heart attack, death, renal impairment, and arterial and venous bleeding  were explained to the patient and her husband at length.  She was placed  on the catheterization table, prepped in a sterile fashion.  Lidocaine  1% was used for local anesthesia.  She opted not to receive any  conscious sedation.  She tolerated the procedure very well.  Using the  modified Seldinger technique, a venous sheath 7-French was placed into  the right femoral vein.  A femoral artery sheath was then placed in the  right femoral artery using the same technique 4-French system.  Right  right heart catheterization was performed first with saturations and  hemodynamics performed.  Following right heart catheterization, the  angle pigtail was then used to cross into the left ventricle.  Simultaneous hemodynamic pressures were obtained.  Following this, the  Swan catheter was removed and the pigtail was also removed.  A Judkins  left #4 catheter was then used to selectively cannulate the left main  artery.  The placement of the catheter was not optimal, so therefore a  Judkins left #5 catheter was used to selectively cannulate this artery.  Multiple views with hand injection of Omnipaque were obtained.  This  catheter was then exchanged for a no-torque Williams right catheter  which was used to selectively cannulate the right coronary artery.  Multiple views with hand injection of Omnipaque were obtained.  The  angle pigtail was once again brought up into the left ventricle.  A left  ventriculogram utilizing 30 mL of contrast was then obtained in the RAO  position with power injection.  This catheter was then pulled back  across the aortic valve and hemodynamics were obtained.  Following  procedure, sheaths were removed.  She was hemodynamically stable.   FINDINGS:  1. Left main artery - no angiographically significant coronary artery      disease, mild calcification noted.  2. Left anterior descending artery - proximal calcification in the      arterial tree noted.  She has 90% proximal LAD stenosis at the      bifurcation of a large diagonal branch.  This diagonal branch also      has approximately 70% stenosis.  The remainder of the LAD remains      small in caliber, but widely patent.  3. Circumflex artery - this is a  large dominant artery giving rise to      2 significant obtuse marginal branches.  There is no      angiographically significant coronary artery disease present.  4. Right coronary artery.  This is a small vessel, nondominant with no      angiographically significant coronary artery disease.  5. Aorta - there is prominent aortic calcification seen at the aortic      arch as well as at the ostium of the left main artery.   HEMODYNAMICS:  Left ventricle systolic pressure of 170 with an end-  diastolic pressure of 30 mmHg.  Aortic pressure of 170/83 with a mean of  123 mmHg.   RIGHT HEART CATHETERIZATION:  1. Right atrial pressure 12/8 with a mean of 7 mmHg.  2. Right ventricular pressure of 49/6 with end-diastolic pressure of      10 mmHg.  3. Pulmonary artery pressure 49/28 with a mean of 38 mmHg.  4. Pulmonary capillary wedge pressure 35/30 with a mean of 27 mmHg.       Cardiac output by Fick method 2.7 liters per minute with a cardiac      index of 1.9, by thermodilution method 2.2 liters per minute with a      cardiac index of 1.5.  Pulmonary artery saturation was 61% and      femoral artery saturation was 96%.   LEFT VENTRICULOGRAM:  Severely decreased left ventricular contractile  performance with an estimated ejection fraction of approximately 20-25%.  No evidence of thrombus.  There is also severe mitral regurgitation with  early opacification of the left atrium - 4+.  This matches the intensity  of the left ventricle within 2 beats.  There appears to be severe global  hypokinesis with minor sparing of the inferoapical segment.   IMPRESSION:  1. Severe proximal left anterior descending stenosis/first diagonal      stenosis with dense calcification, 90%.  Otherwise, no      angiographically significant coronary artery disease.  2. Severely decreased left ventricular ejection fraction of      approximately 20% with generalized decreased contraction.  There is      minor sparing of the inferoapical segment with noticeable mild      increased contractile performance of this segment alone.  3. Elevated left ventricular end-diastolic pressure of 30 mmHg, which      corresponds with a wedge pressure of 27 mmHg, indicative of severe      diastolic dysfunction.  4. Severe mitral regurgitation as seen on left ventriculogram.  On      transesophageal echo, a middle scallop of the anterior leaflet was      noted to have significant prolapse.  The jet is eccentric and      posteriorly directed.  5. Elevated right-sided pressures, most notable pulmonary artery      pressure with a mean of 38 mmHg consistent with mild-to-moderate      pulmonary hypertension.  This is most likely secondary from      elevated left ventricular end-diastolic pressures from severe left      ventricular dysfunction/mitral regurgitation.   PLAN:  We will discuss films with Dr. Cornelius Moras  of Cardiothoracic Surgery,  who has already seen the patient in consultation.  Findings have been  discussed with the patient and her husband.  She will be administered  Lasix 40 mg IV in Recovery.  She is denying any significant shortness of  breath; however, with her elevated left ventricular end-diastolic  pressure and her contrast load, I elected to give her Lasix. Also, she  has been placed on metoprolol 25mg  ER QD. She knows to contact me with  any worrisome symptoms. NTG has also been perscribed.      Jake Bathe, MD  Electronically Signed     MCS/MEDQ  D:  12/17/2008  T:  12/17/2008  Job:  [id number]   cc:   Salvatore Decent. Cornelius Moras, M.D.  Candyce Churn, M.D.

## 2011-02-07 NOTE — Discharge Summary (Signed)
[name], [name]                 ACCOUNT NO.:  [id number]   MEDICAL RECORD NO.:  [id number]          PATIENT TYPE:  INP   LOCATION:  2916                         FACILITY:  [facility]   PHYSICIAN:  Francisca December, M.D.  DATE OF BIRTH:  Nov 12, 1932   DATE OF ADMISSION:  03/31/2009  DATE OF DISCHARGE:  04/01/2009                               DISCHARGE SUMMARY   DISCHARGE DIAGNOSES:  1. Severe ischemic cardiomyopathy, left ventricular ejection fraction      20%.  2. Chronic systolic heart failure, NYHA class III.  3. Chronic obstructive pulmonary disease.  4. Left bundle branch block.  5. Raynaud syndrome.  6. Thoracolumbar scoliosis.  7. Cervical degenerative joint disease.  8. Lumbar degenerative joint disease.  9. Hypertension.  10.Hypothyroidism following Graves disease.  11.History of renal artery stenosis, treated with angioplasty in 1980      and 1987.  12.Status post coronary artery bypass graft, April 2010.  13.Mitral valve repair with 26-mm Edwards ring angioplasty.  14.Status post insertion of implantable cardioverter-defibrillator      with biventricular pacing capability, March 31, 2009.   COURSE IN THE HOSPITAL:  [name] was admitted as an outpatient for  insertion of her ICD BiV.  She underwent an initially uncomplicated  insertion with excellent result.  However in the recovery area, she was  noted to have an increasing size of her pacemaker pocket and increasing  pain in that region.  This was observed for an additional 3-4 hours and  the pocket continued to progress and enlarged.  There was progressive  ecchymosis.  Approximately 6 hours after the initial insertion, she was  returned to cardiac catheterization laboratory and had a pocket  exploration with hematoma evacuation.  A small bleeding site in the  inferomedial portion of the interior pocket was identified and  cauterized.  She then had the standard wound closure and went to the  recovery floor in stable  condition.  The following day, the wound was  noted to be clean and dry.  There was surrounding ecchymosis and  expected degree of tenderness.  No fluctuance, erythema, or drainage.  Plans were then made for discharge.   DISCHARGE MEDICATIONS:  1. Synthroid 75 mcg daily.  2. Fluconazole 100 mg p.o. q. Week.  3. Nortriptyline 25 mg two at bedtime.  4. Omeprazole 20 mg p.o. daily.  5. Singulair 10 mg p.o. daily.  6. Fosamax 70 mg weekly.  7. Baby aspirin 81 mg p.o. daily.  8. Simvastatin 40 mg p.o. daily.  9. Potassium chloride 20 mEq p.o. daily.  10.Warfarin at previous dosage level.  11.Carvedilol 6.25 mg twice daily.  This dose increased.  12.Diovan 50 mg p.o. daily.  13.Furosemide 40 mg once daily or as directed.  14.Tylenol No. 3 one to two tablets p.o. q.4-6 h. p.r.n. pain.   DISCHARGE CONDITION:  Stable and improved.   DISCHARGE FOLLOWUP:  The patient has appointment for followup in the  office on April 06, 2009 at 2 p.m. for pocket hematoma check and also for  warfarin INR level.  Francisca December, M.D.  Electronically Signed     JHE/MEDQ  D:  04/21/2009  T:  04/21/2009  Job:  [id number]

## 2011-02-07 NOTE — Discharge Summary (Signed)
Denise [name], [name]                 ACCOUNT NO.:  [id number]   MEDICAL RECORD NO.:  [id number]          PATIENT TYPE:  INP   LOCATION:  2029                         FACILITY:  [facility]   PHYSICIAN:  Salvatore Decent. Cornelius Moras, M.D. DATE OF BIRTH:  [birth year]   DATE OF ADMISSION:  01/04/2009  DATE OF DISCHARGE:                               DISCHARGE SUMMARY   FINAL DIAGNOSES:  1. Severe mitral regurgitation.  2. Single-vessel coronary artery disease.   IN-HOSPITAL DIAGNOSES:  1. Acute blood loss anemia postoperatively.  2. Volume overload postoperatively.  3. Postoperative atrial fibrillation.  4. Thrombocytopenia postoperatively.  5. Right pleural effusion postoperatively.   SECONDARY DIAGNOSES:  1. Left ventricular dysfunction with congestive heart failure.  2. Hypertension.  3. Hyperlipidemia.  4. Chronic obstructive pulmonary disease.  5. Graves disease status post I-131 ablation therapy.  6. Raynaud syndrome.  7. Fibromuscular dysplasia.  8. Degenerative arthritis.  9. Thoracolumbar scoliosis.  10.Cervical degenerative joint disease and lumbosacral degenerative      joint disease.  11.Status post tonsillectomy.  12.Status post appendectomy.  13.Status post hemorrhoidectomy.  14.Status post hysterectomy without oophorectomy.  15.Breast biopsy.  16.Status post sinus surgery x2.  17.Status post bilateral rotator cuff repair.  18.Status post lumbar laminectomy.  19.Status post cervical fusion x3.  20.Status post umbilical hernia repair.  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  22.Status post percutaneous transluminal angioplasty, bilateral renal      arteries.  23.Status post facelift, rhinoplasty, and eye surgery.  24.Status post bilateral cataract extraction.  25.Status post carpal tunnel syndrome on left.  26.Status post trigger finger release x2.  27.Status post right carpal tunnel surgery.  28.__________ prosthesis, left  thumb.   IN-HOSPITAL OPERATIONS AND PROCEDURES:  1. Median sternotomy for mitral valve repair using a core matrix      patch, augmentation posterior leaflet with 26-mm Edwards Physio II      ring annuloplasty.  2. Coronary artery bypass grafting x2 using a left internal mammary      artery to distal left anterior descending coronary artery disease,      saphenous vein graft to diagonal branch.  Endoscopic saphenous vein      harvest from right thigh.   HISTORY AND PHYSICAL AND HOSPITAL COURSE:  The patient is a 77 year old  female with multiple medical problems, recently diagnosed with  congestive heart failure.  Echocardiogram revealed severe mitral  regurgitation with moderate to severe left ventricular dysfunction.  Transesophageal echocardiogram confirmed the presence of mitral  regurgitation and significant left ventricular dysfunction.  Cardiac  catheterization done revealed single vessel coronary artery disease with  high-grade proximal stenosis of left anterior descending coronary artery  and diagonal branch.  Dr. Cornelius Moras was consulted for evaluation of coronary  artery bypass grafting as well as mitral valve repair/replacement.  He  discussed with the patient undergoing the above-mentioned procedures.  Risks and benefits were discussed with the patient.  The patient  acknowledged understanding and agreed to proceed.  Surgery was scheduled  for January 04, 2009.  For further details  of the patient's past medical  history and physical exam, please see dictated H&P.   The patient underwent median sternotomy for mitral valve repair with  core matrix patch and augmentation of posterior leaflet with 26-mm  Edwards Physio II ring annuloplasty.  The patient also had coronary  artery bypass grafting x2 using a left internal mammary artery graft to  distal left anterior descending coronary artery, saphenous vein graft to  diagonal branch.  Endoscopic saphenous vein harvest from right thigh  was  done.  The patient tolerated this procedure well and transferred to the  Intensive Care Unit in stable condition.  Postoperatively, the patient  noted to be hemodynamically stable.  She was extubated evening of  surgery.  Post-extubation, she was noted to be alert and oriented x4,  neuro intact.  The patient's ICU stay was several days longer than  normal secondary to unable to be weaned off all pressors.  The patient's  blood pressure was stable, required Levophed and dopamine.  These were  eventually able to be weaned and discontinued by postop day #3.  Blood  pressure was stable post discontinuation of drips.  Initially, heart  rate was noted to be in normal sinus rhythm.  She did have a short run  of atrial fibrillation night after surgery.  She had been started on IV  amiodarone, which converted her back to normal sinus rhythm.  The  patient was then placed on AAI pacing.  This was able to be turned off  once blood pressure stabilized.  The patient's heart rate continued to  be monitored and remained stable, in normal sinus rhythm.  She was  eventually able to be discontinued from the IV amiodarone and continued  on p.o.  In the intensive care unit, the patient had complaints of  dizziness postoperatively with ambulation as well as with rest.  She had  multiple allergies and we placed a fentanyl patch on the patient for  pain management.  This was discontinued and the patient was started on  Ultram as needed for pain.  Pain-wise, Ultram was sufficient.  Her  dizziness was monitored and resolved.  The patient did have significant  volume overload postoperatively in the ICU.  She required Lasix drip.  The patient was diuresing well and was able to be switched over to p.o.  Lasix prior to transfer.  She had some mild acute blood loss anemia, but  was stable.  She did not require any transfusions postoperatively.  The  patient was slow to progress.  She was working with Cardiac Rehab  in the  unit requiring much assistance.  She was finally able to be transferred  out to the CTCU on postop day #5.  While in telemetry floor, the  patient's heart rate continued to be monitored.  She remained in normal  sinus rhythm.  Blood pressure remained stable.  She was continued on  Lopressor as well as ACE inhibitor added.  The patient tolerated this  well.  Her weights were followed closely.  She was continued on Lasix  daily.  She is currently 1 kg above her preoperative weight.  From a  pulmonary standpoint postoperatively, the patient's initial chest x-ray  was stable.  Her chest tubes were able to be discontinued in a normal  fashion.  Followup chest x-rays were followed and noted to be stable  with small bilateral effusions.  The patient was able to be weaned off  oxygen, sating greater than 90% on room air.  The patient continued to  complain of shortness of breath at rest as well as with ambulation.  Repeat chest x-ray done on January 14, 2009, showed a mild to moderate  right pleural effusion with a small left effusion.  The patient may  require right thoracentesis prior to discharge home.  Currently awaiting  MD's decision.  She is currently sating greater than 90% on room air as  well as 98-99% with ambulation.  Postoperatively, the patient's  creatinine remained stable.  Her kidneys did tolerate the diuretics  well.  Postoperatively, the patient was started on Coumadin.  Daily  PT/INRs were obtained.  Coumadin was adjusted appropriately.  Current  INR is 2.0.  we will discontinue external pacing wires today, postop day  #10.  Her hemoglobin/hematocrit continued to be followed.  They remained  stable.  Last hemoglobin/hematocrit on postop day #10 was 9.8 and 29.0.  Postoperatively, the patient continued to work with Cardiac Rehab, but  was extremely slow to progress.  PT/OT was consulted.  They both agreed  with skilled nursing facility versus home health.  Case Management  was  consulted.  They currently have a bed available for the patient at  [facility].  The patient is currently debating if she wants to go to  nursing home versus going home with husband with home health.  The  patient's husband noted to have back problems and we are currently  recommended the patient transferring to [facility] for a short time.   On postop day #10, January 14, 2009, the patient was noted to be afebrile.  She is in normal sinus rhythm.  Blood pressure stable.  She is sating  95% on room air.  All incisions were noted to be clean, dry, and intact  and healing well.  She is tolerating a regular diet with Ensure  supplements.  The patient is tentatively ready for transfer to skilled  nursing facility versus to home in the next 24 hours pending she  remained stable.   FOLLOWUP APPOINTMENTS:  A followup appointment has been arranged with  Dr. Cornelius Moras for Feb 08, 2009, at 12:15 p.m.  The patient will need to  obtain PA and lateral chest x-ray 30 minutes prior to this appointment.  The patient will need to follow up with Dr. Anne Fu in 2 weeks.  She will  need to contact his office to make these arrangements.   ACTIVITY:  The patient is instructed no driving until released to do so,  no heavy lifting over 10 pounds.  She is told to ambulate 3-4 times per  day, progress as tolerated and continue her breathing exercises.   INCISIONAL CARE:  The patient is told to shower, washing her incisions  using soap and water.  She is to contact the office if she develops any  drainage or opening from any of her incision sites.   DIET:  The patient is educated on diet to be low fat, low salt.   DISCHARGE MEDICATIONS:  1. Fluconazole 100 mg weekly.  2. Singulair 10 mg daily.  3. Levoxyl 0.75 mg daily.  4. Nortriptyline 20 mg at night.  5. Crestor 5 mg daily.  6. Metoprolol mg daily.  7. Fosamax weekly.  8. Omeprazole 20 mg daily.  9. Amiodarone 200 mg b.i.d.  10.Enteric-coated  aspirin 81 mg daily.  11.Lasix 40 mg daily.  12.Potassium chloride 20 mEq daily.  13.Ultram 50 mg 1-2 tabs q.4-6 h. p.r.n. pain.  14.Lisinopril 5 mg daily.  15.Ensure  1 can 3 times per day with milk.  16.Coumadin will be dosed based on the patient's discharge PT/INR      levels.      Theda Belfast, PA      Salvatore Decent. Cornelius Moras, M.D.  Electronically Signed    KMD/MEDQ  D:  01/14/2009  T:  01/15/2009  Job:  [id number]   cc:   Jake Bathe, MD

## 2011-02-07 NOTE — Op Note (Signed)
[name], [name]                 ACCOUNT NO.:  [id number]   MEDICAL RECORD NO.:  [id number]          PATIENT TYPE:  INP   LOCATION:  2302                         FACILITY:  [facility]   PHYSICIAN:  Salvatore Decent. Cornelius Moras, M.D. DATE OF BIRTH:  02/15/1933   DATE OF PROCEDURE:  01/04/2009  DATE OF DISCHARGE:                               OPERATIVE REPORT   PREOPERATIVE DIAGNOSES:  1. Severe mitral regurgitation.  2. Single-vessel coronary artery disease.   POSTOPERATIVE DIAGNOSES:  1. Severe mitral regurgitation.  2. Single-vessel coronary artery disease.   PROCEDURE:  Median sternotomy for mitral valve repair (CorMatrix patch  augmentation of posterior leaflet with 26-mm Edwards Physio II ring  annuloplasty) and coronary artery bypass grafting x2 (left internal  mammary artery to distal left anterior descending coronary artery,  saphenous vein graft to diagonal branch, endoscopic saphenous vein  harvest from right thigh).   SURGEON:  Salvatore Decent. Cornelius Moras, MD   ASSISTANT:  Doree Fudge, PA   ANESTHESIA:  General.   BRIEF CLINICAL NOTE:  The patient is a 77 year old female with multiple  medical problems, recently diagnosed with congestive heart failure.  Echocardiogram reveals severe mitral regurgitation with moderate-to-  severe left ventricular dysfunction.  Transesophageal echocardiogram  confirms the presence of mitral regurgitation and significant left  ventricular dysfunction.  Cardiac catheterization reveals single-vessel  coronary artery disease with high-grade proximal stenosis of the left  anterior descending coronary artery and the diagonal branch.  A full  consultation note has been dictated previously.  The patient and her  husband have been counseled regarding the indications, risks, and  potential benefits of surgery.  Alternative treatment strategies have  been discussed.  They understand and accept all potential associated  risks of surgery and desire to proceed as  described.   OPERATIVE FINDINGS:  1. Moderate-to-severe left ventricular dysfunction with nonischemic      cardiomyopathy, ejection fraction estimated 25-30%.  2. Moderate-to-severe mitral regurgitation, functional type IIIA with      restricted leaflets.  3. Good-quality left internal mammary artery conduit for grafting.  4. Good-quality saphenous vein conduit for grafting.  5. Small caliber, but good-quality target vessels for grafting.  6. Moderately diseased ascending aorta.  7. No residual mitral regurgitation following successful mitral valve      repair.   OPERATIVE NOTE IN DETAIL:  The patient was brought to the operating room  on the above-mentioned date and central monitoring was established by  the Anesthesia Team under the care and direction of Dr. Arta Bruce.  Specifically, a Swan-Ganz catheter was placed through the right internal  jugular approach.  A radial arterial line was placed.  Intravenous  antibiotics were administered.  Following induction with general  endotracheal anesthesia, a Foley catheter was placed.  The patient's  chest, abdomen, both groins, and both lower extremities were prepared  and draped in sterile manner.  Baseline transesophageal echocardiogram  was performed by Dr. Michelle Piper.  This confirms the presence of at least  moderate mitral regurgitation.  Functional anatomy of mitral  regurgitation demonstrates restriction of both the inferior and  posterior leaflets,  particularly the posterior leaflet.  The jet of  mitral regurgitation directed somewhat eccentrically posteriorly.  There  was moderate-to-severe left ventricular dysfunction with ejection  fraction estimated perhaps 30%.  There was some dyssynergy of the left  ventricular interventricular septum.  There was a global hypokinesis.  Left ventricular dysfunction does not appear to be consistent with  typical ischemic cardiomyopathy, no evidence of regional wall motion  abnormalities.  A  median sternotomy incision was performed in the left  internal mammary artery, it was dissected from the chest wall and  prepared for bypass grafting.  The left internal mammary artery was good-  quality conduit.  Simultaneously, saphenous vein was obtained from the  patient's right thigh using endoscopic vein harvest technique through a  small incision made just below the right knee.  The saphenous vein was  good-quality conduit.  After the saphenous vein has been removed, the  small incisions in the right lower extremity were closed in multiple  layers with running absorbable suture.  The patient was heparinized  systemically and the left internal mammary artery was transected  distally.  It was noted to have good flow.   Pericardium was opened.  The ascending aorta was moderately diseased  with atherosclerosis.  The ascending aorta was cannulated for  cardiopulmonary bypass.  A small stab incision was made in the right  groin.  The right common femoral vein was cannulated using a Seldinger  technique and a long flexible guidewire was advanced up through the  femoral vein up through the inferior vena cava through the right atrium  into the superior vena cava using transesophageal echocardiogram for  guidance.  The femoral vein was dilated with serial dilators and a 22-  [place] long femoral venous cannula was advanced over serial dilators  into the right atrium.  A retrograde cardioplegic cannula was placed  through the right atrium into the coronary sinus.   Cardiopulmonary bypass was begun.  The superior vena cava cannula was  placed directly in the superior vena cava for bicaval drainage.  A  temperature probe was placed in the left ventricular septum.  A  cardioplegic cannula was placed in a 30-degree systemic temperature.  The aortic crossclamp was applied and cold blood cardioplegia was  delivered initially in antegrade fashion through the aortic root.  Iced  saline slush was  applied for topical hypothermia.  The initial  cardioplegic arrest and myocardial cooling was excellent.  Supplemental  cardioplegia was administered retrograde through the coronary sinus  catheter.  Repeat doses of cardioplegia were administered intermittently  throughout the crossclamp portion of the operation through the aortic  root, down the subsequently placed vein graft, and retrograde through  the coronary sinus catheter to maintain completely flat  electrocardiogram and myocardial temperature less than 15-degree  centigrade.   The following distal coronary anastomoses were performed:  1. The diagonal branch of the left anterior descending coronary artery      was grafted with saphenous vein graft in end-to-side fashion.  This      vessel measured 1.3 mm in diameter and was a good-quality target      vessel at the site of distal grafting.  2. The distal left anterior descending coronary artery was grafted      with left internal mammary artery in end-to-side fashion.  This      vessel measured 1.6 mm in diameter and was a good-quality target      vessel at the site of distal grafting.  A left atriotomy incision was performed posteriorly through the  interatrial groove.  The mitral valve was exposed using a self-retaining  retractor.  Exposure was felt to be excellent.  The mitral valve was  carefully examined.  There was mild thickening of the leaflets, but this  was very mild and not at all suggestive of rheumatic mitral valve  disease.  The posterior leaflet was significantly restricted  particularly near the P2P3 region.  The subvalvular apparatus was bit of  unusual with broad fan-shaped chordae tendineae to the posterior  leaflet.  However, the cords emanate from two separate papillary muscles  as was normally appreciated.  There was no mitral valve prolapse at all.  There was no calcification of any significance.  There was no  foreshortening or thickening of the chords  to suggest rheumatic mitral  valve disease.  No other abnormalities were noted.   Interrupted 2-0 Ethibond horizontal mattress sutures were placed  circumferentially around the entire mitral annulus.  These sutures  ultimately were to be utilized for ring annuloplasty and at this  juncture, they suspend the valve into view.  The valve was again  carefully inspected.  Simple ring annuloplasty was felt not to be  sufficient due to the amount of restriction of the posterior leaflet.  Therefore, patch augmentation of posterior leaflet was entertained.  A  CorMatrix patch was soaked in saline for 10 minutes and ultimately  trimmed into an elliptical shape.  The posterior leaflet was now  mobilized off of the posterior mitral annulus using a 11-blade knife to  go from commissure to commissure.  After trimming the CorMatrix patch  and presoaking it, the patch was then sewn into place initially to the  free margin of the posterior leaflet anteriorly and subsequently to the  posterior annulus posteriorly.  Each suture line was completed with a  two-layer closure of running CV5 Gore-Tex suture.  After completion of  patch augmentation, the valve appeared to be competent even without  annuloplasty ring in place on saline testing.   The mitral valve was sized to a 26-mm annuloplasty ring.  This  corresponds precisely to the dimensions of the anterior leaflet.  An  Edwards Physio II annuloplasty ring (size 26-mm, model number 5200,  serial number [id number]) was secured in place uneventfully.  The valve was  again tested with the saline testing appeared to be perfectly competent  with a broad symmetrical line of coaptation of the anterior and  posterior leaflet.  Rewarming was begun.   The left atriotomy incision was closed with a two-layer closure of  running 3-0 Prolene suture.  The single proximal saphenous vein  anastomoses was performed directly to the ascending aorta prior to  removal of the  aortic crossclamp.  Left ventricular septal temperature  rises rapidly with reperfusion of the left internal mammary artery.  The  aortic crossclamp was removed after a total crossclamp time of 159  minutes.  The heart began to beat spontaneously without need for  cardioversion.  Epicardial pacing wires were fixed to the right  ventricular free wall into the right atrial appendage.  The patient was  rewarmed to 37-degree centigrade temperature.  Low-dose dopamine  infusion was begun.  The SVC cannula was removed and the cannulation  site oversewn.   The patient was weaned from cardiopulmonary bypass.  The patient's  rhythm at separation from bypass was sinus bradycardia.  AV sequential  pacing was employed.  Total cardiopulmonary bypass time the operation  was 188 minutes.  The patient was weaned from bypass on dopamine at 5  mcg/kg/min.  Low-dose milrinone infusion was added after separation from  bypass due to moderate left ventricular dysfunction.   Followup transesophageal echocardiogram performed by Dr. Michelle Piper.  After  separation from bypass demonstrates no residual mitral regurgitation.  There was a well-seated mitral valve annuloplasty ring in the mitral  position.  The mitral valve appears to be functioning normally.  There  was no sign of any significant gradient to suggest mitral stenosis  across the valve with mean gradient estimated less than 6 mmHg.  The  aortic valve appears normal.  There remains severe left ventricular  dysfunction as noted preoperatively.   Protamine was administered to reverse the anticoagulation.  The aortic  cannula was removed uneventfully.  Femoral venous cannula was then  removed and manual pressure was held on the groin for 15 minutes.  Mediastinum was irrigated with saline solution.  Meticulous surgical  hemostasis was ascertained.  The mediastinum and both left and right  pleural spaces were drained using four chest tubes exited through   separate stab incisions inferiorly.  The pericardium and soft tissues  anterior to the aorta were reapproximated loosely.  The sternum was  closed with sternal wire.  The soft tissues anterior and sternum were  closed in layers and the skin was closed with interrupted 3-0 Prolene  vertical mattress sutures due to the very frail delicate nature of the  patient's skin.   The patient tolerated the procedure well and was transported to surgical  intensive care unit in stable condition.  There were no intraoperative  complications.  All sponge, instrument, and needle counts were verified  correct at completion of the operation.      Salvatore Decent. Cornelius Moras, M.D.  Electronically Signed     CHO/MEDQ  D:  01/04/2009  T:  01/05/2009  Job:  [id number]   cc:   Jake Bathe, MD  Candyce Churn, M.D.

## 2011-02-07 NOTE — Consult Note (Signed)
NEW PATIENT CONSULTATION   [name], [name] I  DOB:  1933-06-19                                        December 14, 2008  CHART #:  [id number]   REQUESTING PHYSICIAN:  Jake Bathe, MD   PRIMARY CARE PHYSICIAN:  Candyce Churn, MD   REASON FOR CONSULTATION:  Severe mitral regurgitation.   HISTORY OF PRESENT ILLNESS:  The patient is a 77 year old female with  multiple medical problems who has recently been diagnosed with mitral  regurgitation.  She states that she has never been told that she had a  heart murmur in the past, and she otherwise has no previous cardiac  history.  She actually had an echocardiogram performed in 1987 at [facility] demonstrating thickened mitral valve leaflets  with normal left ventricular function, no mitral regurgitation.  She  also had an echocardiogram performed at [facility] in 2008.  This also reportedly demonstrated normal left  ventricular function with no significant valvular heart disease.  The  patient has never been told that she had rheumatic fever nor other  cardiac pathology.  The patient does report that she has a longstanding  history of exertional shortness of breath.  For the most part, this has  been attributed to chronic obstructive pulmonary disease with emphysema.  Other medical problems include hypertension, arthritis, Raynaud  syndrome, Graves disease treated with radioactive iodine, secondary  hypothyroidism, and fibromuscular dysplasia.  In early February, the  patient underwent an outpatient elective surgical procedure on her left  hand by Dr. Merlyn Lot at the [facility].  Apparently, the patient developed chest tightness and resting shortness  of breath during and after the procedure.  A chest x-ray and  electrocardiogram were performed.  Chest x-ray revealed moderate  enlargement of a cardiac silhouette with pulmonary venous  hypertension,  consistent with interstitial edema.  Reportedly, electrocardiograms were  normal.  She was diagnosed with class IV congestive heart failure.  She  was seen in followup by Dr. Kevan Ny and she symptomatically improved with  Lasix diuretic therapy.  She was referred to Dr. Anne Fu for formal  cardiology evaluation.  Echocardiogram was performed on December 07, 2008.  By report, this demonstrated severe left ventricular dysfunction with  ejection fraction estimated 20-25%.  There was mild left atrial  enlargement.  There was normal left ventricular dimensions.  There was  severe mitral regurgitation with an eccentric jet directed posteriorly.  There was suggestion of a possible flail scallop of the mitral valve.  No others significant abnormalities were noted.  She has been scheduled  for transesophageal echocardiogram and cardiac catheterization, both of  which have yet to be performed.  She has been referred for surgical  consultation.   REVIEW OF SYSTEMS:  GENERAL:  The patient reports normal appetite.  However, she has been losing weight ever since [place] of this past year,  having lost from 125 pounds down to 106 pounds.  She is 5 feet tall.  She has longstanding exertional fatigue.  CARDIAC:  Notable for  longstanding stable exertional shortness of breath with recent  exacerbation of shortness of breath over the last few weeks, most  substantially after her left hand surgery in early February.  She also  reports some orthopnea this morning for the first time.  She otherwise  remains reasonably active and in fact, she walks three times a day up to  a mile each time, and she takes water aerobics classes twice a week.  She does report that she gets some tight feeling across her chest during  her water aerobics classes as well as increased shortness of breath.  She has not had lower extremity edema.  She has not had any tachy  palpitations or syncopal episodes.  RESPIRATORY:   Notable for increasing  shortness of breath as well as oxygen requirement.  Following her  surgery in February, she was noted to be hypoxemic and she has been  given oxygen therapy at home, which she continues to use at night.  She  denies any productive cough, hemoptysis, wheezing.  GASTROINTESTINAL:  Notable for longstanding intermittent episodes of mild difficulty  swallowing which can occur either with liquids or solids and seems to be  entirely intermittent, perhaps spastic in nature.  Other times, she has  no difficulty swallowing either liquid or solid food.  Swallowing is not  painful.  She has longstanding symptoms of reflux that are controlled  with medical therapy.  GENITOURINARY:  Negative.  PERIPHERAL VASCULAR:  Negative.  NEUROLOGIC:  Notable for some history of numbness in both  hands in the past, which has been attributed to both carpal tunnel  syndrome and Raynaud syndrome.  MUSCULOSKELETAL:  Notable for  longstanding arthritis and arthralgias particularly afflicting her lower  back and her left knee.  PSYCHIATRIC:  Negative.  HEENT:  Negative.  INFECTIOUS:  Negative.  The patient specifically denies any recent  fevers, chills, night sweats.   PAST MEDICAL HISTORY:  1. Mitral regurgitation, newly diagnosed.  2. Left ventricular dysfunction with congestive heart failure, newly      diagnosed.  3. Hypertension.  4. Hyperlipidemia.  5. Chronic obstructive pulmonary disease.  6. Graves disease status post I-131 ablation therapy.  7. Raynaud syndrome.  8. Fibromuscular dysplasia.  9. Degenerative arthritis.  10.Thoracolumbar scoliosis.  11.Cervical degenerative joint disease and lumbosacral degenerative      joint disease.   PAST SURGICAL HISTORY:  1. Tonsillectomy 1944.  2. Appendectomy 1953.  3. Hemorrhoidectomy 1968.  4. Hysterectomy without oophorectomy 1974.  5. Breast biopsy 1974.  6. Sinus surgery x2.  7. Bilateral rotator cuff repair.  8. Lumbar  laminectomy.  9. Cervical fusion x3.  10.Umbilical hernia repair.  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 Edwyna Shell  12.Percutaneous transluminal angioplasty of bilateral renal arteries.  13.Facelift rhinoplasty and eye surgery.  14.Multiple sinus surgeries.  15.Lumbar laminectomy.  16.Bilateral cataract extraction.  17.Carpal tunnel syndrome on left.  18.Trigger finger release x2.  19.Right carpal tunnel surgery.  20.Orlon prosthesis left thumb.   FAMILY HISTORY:  Noncontributory.   SOCIAL HISTORY:  The patient is married and lives with her husband here  in [place].  She is currently not employed.  She is a nonsmoker.  She  denies alcohol consumption.   CURRENT MEDICATIONS:  1. Fosamax 70 mg weekly.  2. Fluconazole 100 mg weekly.  3. Singulair 10 mg daily.  4. Levoxyl 0.75 mg daily.  5. Lasix 20-40 mg daily as needed.  6. Omeprazole 20 mg daily.  7. Potassium chloride 1 tablet every other day.  8. Nortriptyline 25 mg twice daily.  9. Crestor 5 mg daily.  10.Clonidine 0.05 mg daily.  11.Vitamin C daily.  12.Biotin daily.  13.Garlic tablet daily.  14.FiberChoice  daily.  15.Vitamin D supplement daily.  16.Magnesium supplement daily.  17.Viactiv 1 tablet daily.  18.Aspirin 81 mg daily.  19.Wild [place] Salmon Oil.  20.Grape seed extract daily.   DRUG ALLERGIES:  1. SULFA.  2. ASPIRIN causes upset stomach if used uncoated.  3. PENICILLIN.  4. ERYTHROMYCIN.  5. AMITRIPTYLINE.  6. BACTRIM  7. CEFTIN.  8. CIPRO  9. DOXYCYCLINE.  10.FULVICIN.  11.HYDROCODONE.  12.KETOCONAZOLE.  13.KLONOPIN  14.LORCET.  15.NIZORAL.  16.PERCODAN.  17.SEREVENT.  18.TAPAZOLE.  19.TROVAN.  20.VENTOLIN  21.XANAX.  22.KEFLEX.  23.MIACALCIN NASAL SPRAY.  24.HYDROMORPHONE.   PHYSICAL EXAMINATION:  GENERAL:  Notable for a thin female who appears  of stated age, in no acute distress.  VITAL SIGNS:  Blood pressure 143/79, pulse 68  and regular with  occasional ectopic beats, oxygen saturation 99% on room air.  HEENT:  Grossly unrevealing.  There are no cervical lymphadenopathy.  There were  no carotid bruits.  CHEST:  Auscultation of the chest reveals clear breath sounds, which are  symmetrical bilaterally.  There is a well-healed surgical scar in the  right lateral chest wall from previous right thoracotomy.  CARDIOVASCULAR:  Notable for regular rate and rhythm.  There is a very  soft grade 2-3/6 systolic murmur heard best at the left apex with  radiation towards the axilla.  No diastolic murmurs are noted.  ABDOMEN:  Soft, nondistended, nontender.  There are no palpable masses.  Bowel sounds are present.  The liver edge is not palpable.  EXTREMITIES:  Warm and adequately perfused.  There is no lower extremity  edema.  Distal pulses are thready but palpable in the posterior tibial  position bilaterally.  There is chronic erythema in the skin involving  both feet with very thin delicate skin but no open skin lesions or  ulcerations.  Rectal and GU exams were both deferred.  NEUROLOGIC:  Grossly nonfocal and symmetrical throughout.   IMPRESSION:  The patient has recently developed symptoms of congestive  heart failure and echocardiogram reportedly demonstrating severe mitral  regurgitation with possible flail segment of the mitral valve.  By  report, this exam also was notable for the presence of significant left  ventricular dysfunction.  Both of these findings are new in comparison  with echocardiograms performed as recently as 2008.  Transesophageal  echocardiogram has been scheduled as but remains pending.  Cardiac  catheterization has been scheduled but remains pending.   PLAN:  I have discussed matters briefly with the patient and her husband  here in the office today.  We await findings of transesophageal  echocardiogram and cardiac catheterization, which are scheduled later  this week.  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  We will plan to see her back in 2 weeks to  review the results of her upcoming tests and discuss options for  treatment at that time.   Salvatore Decent. Cornelius Moras, M.D.  Electronically Signed   CHO/MEDQ  D:  12/14/2008  T:  12/15/2008  Job:  [id number]   cc:   Jake Bathe, MD  Candyce Churn, M.D.

## 2011-02-07 NOTE — Cardiovascular Report (Signed)
NAMELORENZO, [name]                 ACCOUNT NO.:  [id number]   MEDICAL RECORD NO.:  [id number]          PATIENT TYPE:  INP   LOCATION:  2916                         FACILITY:  [facility]   PHYSICIAN:  Francisca December, M.D.  DATE OF BIRTH:  August 17, 1933   DATE OF PROCEDURE:  03/31/2009  DATE OF DISCHARGE:                            CARDIAC CATHETERIZATION   PROCEDURE PERFORMED:  Implantable cardioverter-defibrillator pocket  exploration and evacuation.   INDICATION:  Ms. Wardrop has an enlarging pocket hematoma from a  procedure performed earlier in the day, completed approximately at 11:00  a.m.  It is now 5:00 p.m. in the afternoon and she has been observed to  have this enlarging hematoma with increasing amounts of pain and  ecchymosis.  She is, therefore, brought to the Catheterization  Laboratory for evacuation and exploration/control of bleeding.   PROCEDURAL NOTE:  The patient brought to the Cardiac Catheterization  Laboratory in the fasting state.  The left prepectoral region was  prepped and draped in usual sterile fashion.  Local anesthesia was  obtained with infiltration of 1% lidocaine over the previous incision  site containing the hematoma.  The wound was then opened using sharp  dissection.  There was a copious amount of thrombus that was evacuated  by manual techniques as well as suction.  The anchoring suture for the  device was taken down, and the device removed from the pocket.  The  pocket was copiously irrigated using gentamicin solution.  There was a  small bleeding region on the medial inferior side of the pocket, that  was cauterized.  Several other areas of slow ooze from the muscle bed  were cauterized.  Active thrombin was applied to within the pocket, and  the device was returned to the pocket with an 0 silk anchoring suture  applied.  Surgicel was placed within the pocket as well superiorly.  The  pocket was observed for 10 minutes for any additional bleeding.   Did not  appear to be any.  The wound was then closed using 2-0 Vicryl in a  running fashion for the subcutaneous layers.  Two layers applied.  The  skin was approximated using 4-0 Vicryl in a running subcuticular  fashion.  Steri-Strips and a sterile dressing were applied, and the  patient was transported to the Cardiac Stepdown Unit in stable  condition.      Francisca December, M.D.  Electronically Signed     JHE/MEDQ  D:  03/31/2009  T:  04/01/2009  Job:  [id number]
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
## 2011-02-10 NOTE — Op Note (Signed)
NAME:  [name], [name]                 ACCOUNT NO.:  [id number]   MEDICAL RECORD NO.:  [id number]          PATIENT TYPE:  AMB   LOCATION:  DSC                          FACILITY:  [facility]   PHYSICIAN:  Dyke Brackett, M.D.    DATE OF BIRTH:  10-18-1932   DATE OF PROCEDURE:  09/08/2005  DATE OF DISCHARGE:  09/09/2005                                 OPERATIVE REPORT   PREOPERATIVE DIAGNOSIS:  Status post rotator cuff repair with residual  arthrofibrosis with small punctate rotator cuff tear with retention of  suture material and acromioclavicular joint arthritis.   POSTOPERATIVE DIAGNOSIS:  Status post rotator cuff repair with residual  arthrofibrosis with small punctate rotator cuff tear with retention of  suture material and acromioclavicular joint arthritis.   OPERATION PERFORMED:  1.  Arthroscopic debridement and acromioplasty.  2.  Arthroscopic excision of distal clavicle and arthroscopic intra-      articular debridement.   SURGEON:  Dyke Brackett, M.D.   ANESTHESIA:  Block with general.   DESCRIPTION OF PROCEDURE:  The patient was arthroscoped in the beach chair  position.  Intra-articular examination of the shoulder showed the patient to  have no real glenohumeral degenerative change.  There was no complete  evidence of a full thickness tear although the anterior leading edge of the  repair had actually a small punctate area that probably did communicate  interstitially.  The whole repair itself was mainly intact.  The exact  footprint of the rotator cuff had not been re-established.  Subacromially,  she was noted to have a lot of hyperemia and scar tissue as well as retained  knot of suture which was removed arthroscopically and AC joint arthritis  which was moderately severe.  It was my feeling that the majority of the  cuff insertion was intact and that attempts at repairing the punctate hole  would not have been that valuable and the choice was between that  debridement  versus taking down the whole insertion of the cuff which I  thought was contraindicated.  Aggressive debridement was carried out with  excision of the distal 1 to 1.5 cm of the clavicle as well as debridement of  the subacromial space. This was accomplished without difficulty.  Shoulder  drained free of fluid.  Portals infiltrated, I believe, with Marcaine. She  had had a preoperative block.  Placed in a sling. Taken to recovery in  stable condition.      Dyke Brackett, M.D.  Electronically Signed     WDC/MEDQ  D:  10/12/2005  T:  10/12/2005  Job:  [id number]

## 2011-02-10 NOTE — Op Note (Signed)
. [facility]  Patient:    [name], [name]                        MRN: [id number] Adm. Date:  [id number] Attending:  Cameron Proud CC:         Pearla Dubonnet, M.D.  Gloris Manchester. Lazarus Salines, M.D.  Dewayne Shorter, M.D.   Operative Report  PREOPERATIVE DIAGNOSIS:  Right upper lobe lesion.  POSTOPERATIVE DIAGNOSIS:  Right upper lobe lesion.  OPERATION PERFORMED:  Right video-assisted thoracic surgery, mini-thoracotomy, wedge resection of right upper lobe lesion and node dissection.  SURGEON:  D. Karle Plumber, M.D.  FIRST ASSISTANT:  Lissa Merlin, P.A.  ANESTHESIA:  General anesthesia.  DESCRIPTION OF PROCEDURE:  After percutaneous insertion of all monitoring lines, the patient underwent general anesthesia and was prepped and draped in the usual sterile manner and was turned to the right lateral thoracotomy position.  Two trocar sites were made in the anterior and posterior axillary lines at the sixth intercostal space and two trocars were inserted.  The lung was easily collapsed.  You could see the lesion in the right upper lobe.  Then a third incision was made at the midaxillary line of approximately 5 cm and dissection was carried down, reflecting the latissimus laterally and the serratus medially.  The fourth intercostal space was entered and a Tuffier was placed in the space and through that, was able to pass the Autosuture stapler, with multiple applications with the 45 and the 60 staplers to wedge off the apex of the lung including the area where the lesion was.  It was sent for frozen section, revealing an inflammatory lesion with probable fungus.  There were multiple large nodes around the hilum and several 10-R nodes were dissected out posteriorly, as well as an 11-R node between the upper lobe bronchus and the lower lobe bronchus.  Then anteriorly, a 4-R node was dissected out of the mediastinum and two more 10-R nodes were  dissected out. Two chest tubes were placed through the trocar sites and tied in place with 0 silk.  Chest was closed with three pericostals, #1 Vicryl in the muscle layer and 2-0 Vicryl in the subcutaneous tissue and 3-0 Vicryl as a subcuticular stitch.  The patient tolerated the procedure well.DD:  08/29/00 TD:  08/29/00 Job: [id number]

## 2011-02-10 NOTE — Op Note (Signed)
[place]. [facility]  Patient:    [name], [name]                        MRN: [id number] Proc. Date: 05/09/01 Adm. Date:  [id number] Attending:  Ronne Binning                           Operative Report  PREOPERATIVE DIAGNOSIS:  Carpal tunnel syndrome left hand.  POSTOPERATIVE DIAGNOSIS:  Carpal tunnel syndrome left hand.  OPERATION:  Decompression left median nerve.  SURGEON:  Nicki Reaper, M.D.  ASSISTANT:  ANESTHESIA:  Axillary block.  ANESTHESIOLOGIST:  Janetta Hora. Gelene Mink, M.D.  INDICATIONS:  The patient is a 77 year old female with a history of carpal tunnel syndrome, EMG nerve conductions positive, which has not responded to conservative treatment.  DESCRIPTION OF PROCEDURE:  The patient was brought to the operating room where an axillary block was carried out without difficulty. She was prepped and draped using Betadine scrubbing solution with the left arm free, in the supine position.  A longitudinal incision was made in the palm and carried down through subcutaneous tissues.  Bleeders were electrocauterized.  Palmar fascia was split. Superficial palmar arch identified.  The flexor tendon in the ring and middle finger identified to the ulnar side of the median nerve. The carpal retinaculum was incised with sharp dissection.  A right angle and Sewall retractor were placed between skin and forearm fascia. The fascia was released for approximately 3 cm proximal to the wrist crease under direct vision. Canal was explored.  No further lesions were identified.  The wound was irrigated. Skin was closed with interrupted 5-0 nylon sutures.  Sterile compressive dressing and splint was applied.  The patient tolerated the procedure well and was taken to the recovery room for observation in satisfactory condition.  She is discharged home to return to the [facility] [place] in one week on [place] N[facility]. DD:  05/09/01 TD:  05/09/01 Job:  [id number]

## 2011-02-10 NOTE — Discharge Summary (Signed)
. [facility]  Patient:    [name], [name]                        MRN: [id number] Adm. Date:  [id number] Disc. Date: 08/02/00 Attending:  Pearla Dubonnet                           Discharge Summary  DISCHARGE DIAGNOSES:  1. Left lower lobe pneumonia with left pleural effusion, condition improved.  2. Hypertension.  3. Hyponatremia, improved.  4. Left pleuritic chest pain secondary to pneumonia, improved.  5. Hypertension.  6. Onychomycosis.  7. Fibromyalgia.  8. Osteopenia.  9. Chronic sinusitis. 10. Gastroesophageal reflux disease. 11. Estrogen replacement therapy. 12. Mild asthma. 13. Left lower lobe pneumonia in September 1999. 14. Carpal tunnel syndrome. 15. Chronic left bundle branch block. 16. Question of a small cerebrovascular accident in 25.  DISCHARGE MEDICATIONS:  1. Suprax 400 mg q.d.  2. Advair 250/50 one inhalation b.i.d.  3. Synthroid 75 mcg q.d.  4. Prevacid 30 mg q.d.  5. Diovan 160 mg q.d.  6. Pamelor 50 mg p.o. q.h.s.  7. Celebrex 200 mg p.o. b.i.d.  8. Toprol XL 25 mg q.d.  9. Norvasc 10 mg q.d. 10. Premarin 0.625 mg q.d. 11. Hydrochlorothiazide 12.5 mg q.d.  HOSPITAL COURSE:  The patient was admitted on July 29, 2000, with a fever and left pleuritic chest pain.  She was found to have a left lower lobe pneumonia with left pleural effusion.  She responded rapidly with diminution in her white count from 22,000 to 15,000 to 5000 over several days.  She never was hypoxic with the pneumonia, but markedly fatigued.  Her left pleuritic chest pain was treated with IV Toradol initially and then Celebrex.  This improved greatly by the time of discharge.  At the time of discharged she was ambulating, eating well, and she had been switched over to Suprax 400 mg q.d. and Rocephin had been discontinued.  The patient did have one blood culture which was positive for gram-positive diplococci and speciation was  pending, but she had responded so well to Rocephin intravenously, that it was assumed that this organism was sensitive to Rocephin, and it would likely be sensitive to oral Suprax.  LABORATORY AND X-RAY DATA:  Sodium on admission was 127, and this came up to 132 by the day of discharge.  Her initial hemoglobin was 11.2, and this dropped to 9.5, and was 10.5 at the time of discharge and was felt to be secondary likely to bone marrow suppression from infection.  This was improving at the time of discharge.  FOLLOWUP:  The patient is to be followed up at [facility] [place] within approximately one week.  She does have multiple drug allergies as per her History and Physical as dictated per July 29, 2000. If she develops a rash, she is to notify us.  CONDITION ON DISCHARGE:  Improved. DD:  08/01/00 TD:  08/01/00 Job: [id number]

## 2011-02-10 NOTE — H&P (Signed)
[name]. [facility]  Patient:    [name], [name]                        MRN: [id number] Adm. Date:  [id number] Attending:  Pearla Dubonnet                         History and Physical  CHIEF COMPLAINT: Chill, cough, and pneumonia.  HISTORY OF PRESENT ILLNESS: Ms. [name] a pleasant 77 year old female with multiple medical problems, who presents with approximately three weeks of cough and 24 hours of chills, with left pleuritic chest pain radiating to the left shoulder.  Chest x-ray was consistent with a lingular pneumonia and she has a WBC of 22,000.  She is admitted now for further therapy.  She is not hypoxic.  Oxygen saturation on room air is 96%.  PAST MEDICAL HISTORY:  1. Hypertension.  2. Onychomycosis.  3. Fibromyalgia.  4. Osteopenia.  5. Chronic sinusitis, followed by Dr. Zola Button [name]. [name].  6. GERD with small hiatal hernia.  7. Estrogen replacement therapy.  8. Mild asthma.  9. History of left lower lobe pneumonia in September 1999. 10. Carpal tunnel syndrome, bilateral. 11. Left bundle branch block. 12. Question of small CVA in 1987 with residual sensation of tight band in the     right arm.  She had numbness in the right foot which resolved. 13. Vertigo in 1998, resolved with Antivert. 14. Cervical DJD. 15. Probable mild Raynauds phenomenon. 16. History of TMJ syndrome.  PAST SURGICAL HISTORY:  1. Cervical spine fusion at C5-6 and C6-7 in 1994.  2. Laminotomy, foraminotomy with compression of the L4 nerve root in 1995.  3. Cervical spinal fusion at C4-5 and C6-7 with titanium plates - J4-7 had to     be reworked at that time (this was 60).  4. Umbilical hernia repair in 1998.  5. Sinus surgery in 1991 when she had bilateral complete endoscopic     sphenoethmoidectomy with bilateral endoscopic antrostomy and bilateral     mucous resection of inferior turbinates.  6. Endoscopic left anterior ethmoidectomy in 1997.  7. Revision of  the right posterior ethmoidectomy and sphenoethmoidectomy and     revision of left anterior ethmoidectomy and antrostomy in 2000.  8. Office surgery for minor revision of ethmoidectomy in 2000.  9. Appendectomy in 1953. 10. Tonsillectomy and adenoidectomy in 1945. 11. Breast lumpectomy on the left in 1974. 12. Hysterectomy in 1974 - ovaries not removed. 13. Dental implants in 1994 and 1995. 14. Epidural steroid injection in 1991 and 1995. 15. Right cataract extraction in 1996. 16. Left cataract extraction in 1998. 17. Hemorrhoidectomy in 1968. 18. Open right rotator cuff repair in 1992. 19. Open left rotator cuff repair in 1998. 20. Renal artery balloon dilatation in 1980 and 1987. 21. Sympathectomy - question level, in 1965.  Dr. Barnett Abu performed her neck surgeries and Dr. [name] performed umbilical hernia repair.  HEALTH MAINTENANCE:  1. Tetanus in 1997.  2. Pneumovax in October 1999.  3. Influenza vaccine yearly.  4. Dr. Myrlene Broker performs GYN health maintenance.  FAMILY HISTORY: Father died at age 41 of stroke - he had heart disease and hypertension.  Mother died with COPD.  There was a remote history of colon cancer in her grandmother.  SOCIAL HISTORY: No smoking.  No alcohol.  No caffeine.  No recreational drugs. The patient is currently disabled from her multiple medical  problems.  She has had one year of college.  She is married and has a child.  She has a supportive husband.  REVIEW OF SYSTEMS: Noncontributory except as above.  She does state that she has had no melena or bright red blood per rectum that she is aware of.  She did have some abdominal pain temporarily today but no nausea or vomiting. This has resolved.  She does complain of chills.  PHYSICAL EXAMINATION:  GENERAL: Well-developed, well-nourished, pale appearing female, complaining of painful cough in the left chest.  VITAL SIGNS: Blood pressure 118/56, temperature 97 degrees, pulse 84  and regular, respiratory rate 20.  HEENT: Conjunctivae pale bilaterally.  Oropharynx dry.  NECK: Without JVD.  No thyromegaly.  CHEST: Clear.  CARDIAC: Regular rate and rhythm without murmur or gallop.  ABDOMEN: Soft, nontender to palpation.  Bowel sounds normal.  No obvious organomegaly.  RECTAL: Hemoccult negative brown stool.  Good tone.  EXTREMITIES: Without clubbing, cyanosis, or edema.  NEUROLOGIC: Examination nonfocal.  She is oriented x 3.  SKIN: Without rashes.  LABORATORY DATA: Chest x-ray positive for left lingular pneumonia, otherwise clear chest x-ray.  EKG is pending.  Sodium is low at 125, chloride 93, potassium 3.9, bicarbonate 26, BUN 16, creatinine 0.9.  Blood sugar 148.  Calcium 8.7.  LFTs are normal.  WBC 20,200, hemoglobin 11.2, platelets 283,000.  Pro time 13.2 seconds, PTT 32 seconds. Urinalysis negative except for small amount of ketones.  ASSESSMENT: The patient is a 77 year old female with left lingular pneumonia. She has multiple allergies to medications.  Hopefully she can tolerate intravenous Rocephin.  Macrolides have been a real problem in terms of allergies.  She is not sure that she is definitely allergic, however, to Biaxin or azithromycin.  PLAN:  1. Hold blood pressure medications for now except Toprol and will hold for     blood pressure systolic less than 120.  2. Hold hydrochlorothiazide now because of reduced sodium and give     intravenous normal saline overnight, and check BMET in morning.  Her low     sodium level could be either secondary to a pulmonary process or from     over-diuresis from hydrochlorothiazide.  3. Continue Advair.  4. Treat with IV Rocephin.  5. Repeat chest x-ray in a.m.  6. If recurrent abdominal pain check ultrasound.  She is not tender over     the gallbladder currently. DD:  07/29/00 TD:  07/30/00 Job: [id number]

## 2011-02-10 NOTE — Op Note (Signed)
[place]. [facility]  Patient:    PAILYN, BELLEVUE Visit Number: [id number] MRN: [id number]          Service Type: SUR Location: [id number] Attending Physician:  Jonne Ply Dictated by:   Stefani Dama, M.D. Proc. Date: 02/18/02 Admit Date:  02/18/2002 Discharge Date: 02/19/2002                             Operative Report  PREOPERATIVE DIAGNOSIS:  C7-T1 spondylolisthesis with cervical radiculopathy, status post anterior cervical diskectomy and arthrodesis, C6-7 with Synthes plate fixation.  POSTOPERATIVE DIAGNOSIS:  Pseudoarthrosis at C6 and C7.  Spondylolisthesis at C7-T1 with cervical radiculopathy.  OPERATION PERFORMED:  Anterior cervical diskectomy at C7-T1, arthrodesis with structural allograft.  Revision of pseudoarthrosis, C6-7 with structural allograft and Synthes plate fixation from C6 to T1.  SURGEON:  Stefani Dama, M.D.  ASSISTANT:  Payton Doughty, M.D.  ANESTHESIA:  General endotracheal.  INDICATIONS FOR PROCEDURE:  The patient is a 77 year old individual who has had significant problems with neck pain, shoulder and arm that became worse over the past couple of months.  She seemed to do well after treatment with steroid medication; however, every time the medication was discontinued, she would have worsening pain in her neck, shoulders and arm.  Work-up demonstrated that she had spondylolisthesis at the C7-T1 level.  Based on the films that were obtained, it was felt that her arthrodesis from the previous operation at the C6-C7 level was solid.  Having failed efforts at conservative management, I discussed with the patient removing the plate and doing a fusion at C7-T1 with an anterior diskectomy.  At the time of surgery, it was found that she had a pseudoarthrosis at the C6-C7 level.  DESCRIPTION OF PROCEDURE:  The patient was brought to the operating room and placed on the table in supine position.  After smooth  induction of general endotracheal anesthesia, she was placed in five pounds of halter traction. The neck was prepped with DuraPrep and draped in sterile fashion.  The previously made vertical incision on the left side of the neck was reopened, dissection was carried down easily through the superficial muscles and the prevertebral space was easily reached.  After uncovering the fascia that had developed around the plate, the plate and screws were identified and these were then sequentially removed.  Upon removing the plate, there was noted to be gross movement between the C6 and C7 vertebrae.  Further exploration identified that there was not a solid arthrodesis at this level and the disk space could be identified as being filled with scar tissue and some degenerated bone. Then by further securing the longus colli muscle on either side a self-retaining Caspar retractor was placed in the wound and the pseudoarthrosis was taken down using a combination of curets, rongeurs, and a 2.3 mm dissecting tool and high speed air drill.  The posterior longitudinal ligament was reached.  The uncinate processes were cleared out to either side. Bone was taken down completely to raw bleeding bone edges on either side. Then a 7 mm tricortical graft was shaped into the appropriate shape and position and placed into the interspace with the cortical surface facing dorsally.  The ventral edges were trimmed.  Attention was then turned to C7-T1.  Here the anterior longitudinal ligament was opened completely and diskectomy was similarly performed.  The disk space here was also noted to be markedly  degenerated and the spondylolisthesis that had recurred with distraction would reduce itself.  With this being in mind, the 7 mm tricortical graft was again fashioned to fit into the interspace, maintaining the space under some distraction.  After this was accomplished, the prevertebral space was cleared and a 40 mm  standard sized Synthes plate was contoured and placed into the wound in the reverse position that is with the superiormost edge of the plate facing anteriorly.  Six locking 4 x 14 mm screws were placed.  However, at the C6 vertebra, the right-sided screw was noted to be stripped and this was replaced with a 4.3 mm screw.  The screws were locked into position, confirmation of the position of the plate and bone grafts was obtained radiographically.  This was felt to be adequate.  The area was copiously irrigated with antibiotic irrigating solution.  Soft tissue hemostasis was meticulously obtained and then the platysma was closed with 3-0 Vicryl in interrupted fashion.  4-0 Nurolon was used to close the subcuticular skin.  Dermabond was placed as a dressing.  The patient tolerated the procedure well. Dictated by:   Stefani Dama, M.D. Attending Physician:  Jonne Ply DD:  02/18/02 TD:  02/19/02 Job: [id number]

## 2011-02-10 NOTE — Discharge Summary (Signed)
[place]. [facility]  Patient:    [name], [name]                        MRN: [id number] Adm. Date:  [id number] Disc. Date: 09/02/00 Attending:  Cameron Proud Dictator:   Lissa Merlin, P.A.-C. CC:         Dewayne Shorter, M.D.  Pearla Dubonnet, M.D.   Discharge Summary  DATE OF BIRTH:  Mar 21, 1933  PRIMARY CARE PHYSICIAN:  Dr. Kevan Ny.  INFECTIOUS DISEASE:  Dr. Orvan Falconer.  PRIMARY ADMISSION DIAGNOSIS:  Right upper lobe lung mass.  DISCHARGE DIAGNOSIS:  Aspergillus of right upper lobe.  PROCEDURES:  Right VATS and mini-thoracotomy with wedge resection of the right upper lobe and node dissection on August 29, 2000, by Dr. Edwyna Shell.  PREVIOUS MEDICAL HISTORY:  1. Osteoarthritis.  2. Asthma.  3. COPD, emphysema.  4. Fibromyalgia.  5. GERD.  6. Hypertension.  7. Chronic sinusitis.  8. Hypothyroidism.  9. Left bundle-branch block. 10. Pneumonia. 11. Renal artery stenosis. 12. Scoliosis. 13. CVA, status post renal artery dilation in 1987. 14. TMJ. 15. Endemic mycosis.  BRIEF HISTORY AND HOSPITAL COURSE:  [name] is a 77 year old white female with multiple medical problems who was referred by Dr. Kevan Ny for evaluation or right upper lobe mass.  On July 29, 2000, she was admitted with pleuritic chest pain.  CBCs showed an increasing WBC.  She was diagnosed with lingular pneumonia, and chest x-ray also showed a mass in the right middle lobe.  After she recovered from her pneumonia, a CT was performed showing a 16 x 12 mm spiculated nodule in the right upper lobe suspicious for cancer.  Dr. Edwyna Shell recommended VATS at the time with possible lobectomy and node dissection depending on pathology.  [name] came into the hospital on August 29, 2000, for the procedure.  She underwent right upper lobe wedge resection and no biopsy with no complications.  She was transferred to PACU in stable condition.  Pathology report initially came  back as fungus.  This was later verified as Aspergillus.  Dr. Edwyna Shell was able to completely resect the tumor. Infectious disease consulted on [name] and felt that the surgery was curative, and there was no need for antifungal treatment.  [name] has done well postoperative with routine care.  It is believed she will be stable for discharge pending satisfactory morning rounds on Sunday, September 02, 2000.  MEDICATIONS:  She will resume her previous home medications with the following changes: 1. She will no longer use Advair inhaler and will instead use albuterol 2    puffs q.i.d., also Serevent diskus 1 puff b.i.d. 2. She will use extra-strength Tylenol 2 tablets every 6 hours p.r.n. for    pain.  SPECIAL INSTRUCTIONS:  [name] is told to do no driving, no strenuous activity, no heavy lifting.  She is told to walk daily.  She is told she can shower.  She is told to get a chest x-ray one hour before she sees Dr. Edwyna Shell at [facility] and to bring it with her when she sees Dr. Edwyna Shell.  She is told to use mild soap and water only on her wounds and to call the office if she notices increasing redness, swelling, drainage, or fever.  FOLLOW-UP:  She will see Dr. Edwyna Shell in one week at the office with the chest x-ray from [facility].  The office will call her  with the appointment day and time.  CONDITION ON DISCHARGE:  She is stable and improved at anticipated time of discharge. DD:  09/01/00 TD:  09/02/00 Job: [id number]

## 2011-02-10 NOTE — Discharge Summary (Signed)
[place]. [facility]  Patient:    [name], [name]                        MRN: [id number] Adm. Date:  [id number] Disc. Date: [id number] Attending:  Cameron Proud CC:         Gloris [place]. Lazarus Salines, M.D.                           Discharge Summary  DISCHARGE DIAGNOSES:  1. Left lower lobe pneumonia with effusion, condition improved.  2. Left pleuritic chest pain, resolved.  3. Hypertension.  4. History of onychomycosis.  5. Fibromyalgia.  6. Osteopenia.  7. Chronic sinusitis.  8. Gastroesophageal reflux disease.  9. Hormone replacement therapy. 10. Mild asthma. 11. Left lower lobe pneumonia, September 1999. 12. Carpal tunnel syndrome. 13. History of left bundle branch block. 14. Question of small cerebrovascular accident in 1987. 15. Hyponatremia, resolved, likely secondary to dehydration and syndrome of     inappropriate antidiuretic hormone secretion.  DISCHARGE MEDICATIONS:  1. Suprax 400 mg q.d. x 10 days.  2. Humibid L.A. 600 mg b.i.d.  3. Tylenol No. 3 one p.o. q.4-8h. p.r.n. pain.  Resume prehospital medications as follows:  1. Advair 250/50 one inhalation b.i.d.  2. Synthroid 75 mcg q.d.  3. Diflucan 100 mg q.d.  4. Prevacid 30 mg q.d.  5. Diovan 160 mg q.d.  6. Nortriptyline 50 mg p.o. q.h.s.  7. Celebrex 200 mg q.d.  8. Toprol XL 25 mg q.d.  9. Norvasc 10 mg q.d. 10. Premarin 0.625 mg q.d. 11. Hydrochlorothiazide 12.5 mg q.d.  ADMISSION HISTORY:  Mrs. Philbert is a 77 year old female with multiple medical problems who presented with approximately three weeks of cough and 24 hours of chills.  She had left pleuritic chest pain.  Chest x-ray was consistent with a lingular pneumonia, and white cell count was 22,000 on admission.  HOSPITAL COURSE:  She was admitted and treated with IV fluids and IV Rocephin 1 g q.24h., and she rapidly became afebrile within the first 12 hours of admission.  She remained afebrile throughout the rest of  hospital course.  She did have one out of two blood cultures grow Strep pneumoniae which was sensitive to ceftriaxone.  Because of pleural effusion, she had a left lateral decubitus chest x-ray performed on August 01, 2000, and the left-sided pleural effusion layered out nicely with no evidence of loculation.  The patient did have a mild hyponatremia on admission at 125 mEq/L.  She was treated with intravenous normal saline, and by August 01, 2000, her sodium had risen to 132, and other electrolytes were completely normal.  LFTs were normal on admission.  Her white blood cell count on admission was 20,200, hemoglobin was 11.2, and platelet count was 283,000.  By discharge on August 01, 2000, white blood cell count was 5900, hemoglobin was 10.5, and platelet count was 365,000.  She has had anemia of chronic disease in the past.  The patient was ambulatory and eating well by the morning of August 02, 2000, and she was discharged home with her husband to be followed up at [facility] [place] within seven days.  CONDITION ON DISCHARGE:  Good. DD:  10/10/00 TD:  10/12/00 Job: [id number]

## 2011-02-10 NOTE — Op Note (Signed)
NAME:  [name], [name]                 ACCOUNT NO.:  [id number]   MEDICAL RECORD NO.:  [id number]          PATIENT TYPE:  AMB   LOCATION:  DSC                          FACILITY:  [facility]   PHYSICIAN:  Dyke Brackett, M.D.    DATE OF BIRTH:  1933/01/06   DATE OF PROCEDURE:  09/08/2005  DATE OF DISCHARGE:  09/08/2005                                 OPERATIVE REPORT   INDICATIONS FOR PROCEDURE:  Patient who had done open rotator cuff repair  about 14 years ago presented with pain not responding to injection in her  shoulder.  She was advised with an arthrogram MRI showing possible recurrent  tear, that we would do an arthroscopy, possible repair predicated on  findings and this could be accomplished as an outpatient versus an  overnight.   PREOPERATIVE DIAGNOSIS:  1.  Right interstitial partial thickness with small component of full      thickness rotator cuff tear with the additional diagnoses of grade 3      glenohumeral arthritis (early).  2.  Impingement.  3.  Acromioclavicular joint arthritis.   POSTOPERATIVE DIAGNOSIS:  1.  Right interstitial partial thickness with small component of full      thickness rotator cuff tear with the additional diagnoses of grade 3      glenohumeral arthritis (early).  2.  Impingement.  3.  Acromioclavicular joint arthritis.   OPERATION:  1.  Arthroscopic acromioplasty.  2.  Arthroscopic rotator cuff debridement.  3.  Arthroscopic debridement of glenohumeral joint.  4.  Arthroscopic distal clavicle excision.   SURGEON:  Dyke Brackett, M.D.   ASSISTANT:  Arlys John D. Petrarca, P.A.-C.   DESCRIPTION OF PROCEDURE:  After general anesthesia and a block administered  by Dr. Ivin Booty, she was arthroscoped through a posterolateral and anterior  portal.  Systematic inspection of the glenohumeral articulation showed the  patient to have a grade 3 chondral lesion of the glenoid which was basically  early grade 3 changes rather generalized.  There was less severe  but  definitely less rounded humeral head consistent with early degenerative  change of the humeral head, as well, and the cuff attachment appeared to be  in reasonably good shape given the fact that the patient had had a previous  repair.  There was a slightly increased area where possibly the tissue was  attenuated.  I would not describe anything I saw intra-particularly as being  consistent with an absolute full thickness component, though there was some  fenestrative tissue that appeared to be possibly a small full thickness,  although I could not really detect that.  The bursa, itself, was  hypertrophied and inflamed.  There was significant impingement from a  degenerative clavicle which was excised over a distance of about 1 cm.  The  cuff tissue appeared not to be extremely healthy at the attachment site,  although it was not really pulled off the attachment.  There was probably a  5-7 mm area where one could visualize from the subacromial side, so to  speak, fenestrated tissue.  Again, this was not a  large area, and there was  actually a retained suture floating in the joint which was possibly causing  some irritation which was removed.  The acromion was still moderately  prominent, it was debrided, and the distal clavicle excised.  My own view is  based on the above mentioned factors, particularly the small minute nature  of the possible recurrence.  The presence of other sources of pain,  specifically glenohumeral arthritis and the end of the clavicle, the  debridement was indicated and to subject her to an involved procedure which  I had planned on considering a repair with a biologic patch.  To do this  potential procedure would have required resection of a large amount of what  appeared to be reasonably healthy tissue given the patient's age and my  thought would be that a small repair area would probably not hold sutures  adequately and it was elected to do a debridement.   Again, the resection  level was carefully checked on all areas of the lateral and anterior portal,  complete bursectomy carried out.  The shoulder was drained of clear fluid.  The portals were closed with nylon.  A lightly compressive sterile dressing  was applied.      Dyke Brackett, M.D.  Electronically Signed     WDC/MEDQ  D:  09/08/2005  T:  09/11/2005  Job:  [id number]

## 2011-02-22 ENCOUNTER — Encounter: Payer: Self-pay | Admitting: Internal Medicine

## 2011-03-01 ENCOUNTER — Encounter: Payer: Self-pay | Admitting: Internal Medicine

## 2011-03-01 ENCOUNTER — Ambulatory Visit (INDEPENDENT_AMBULATORY_CARE_PROVIDER_SITE_OTHER): Payer: Medicare Other | Admitting: Internal Medicine

## 2011-03-01 DIAGNOSIS — I5022 Chronic systolic (congestive) heart failure: Secondary | ICD-10-CM

## 2011-03-01 DIAGNOSIS — I10 Essential (primary) hypertension: Secondary | ICD-10-CM

## 2011-03-01 DIAGNOSIS — I428 Other cardiomyopathies: Secondary | ICD-10-CM

## 2011-03-01 DIAGNOSIS — I2589 Other forms of chronic ischemic heart disease: Secondary | ICD-10-CM

## 2011-03-01 NOTE — Assessment & Plan Note (Signed)
Stable without symptoms of ischemic No changes today.

## 2011-03-01 NOTE — Assessment & Plan Note (Signed)
The patient has stable chronic systolic dysfunction.  She is s/p BiV ICD implantation by Dr Amil Amen.  I have programmed her device today to prevent inappropriate shocks by increasing the NID.  I have also added rate response as she has fatigue and is 40% atrially paced. See paceart for details. She will continue merlin checks every 3 months and I will see annually. SHe will continue to see Dr Anne Fu for routine cardiology care.

## 2011-03-01 NOTE — Progress Notes (Signed)
[name] is a pleasant 77 y.o. yo patient with a h/o CAD, ischemic CM, and NYHA Class II/III CHF sp BiV ICD implantation (SJM) by Dr Amil Amen who presents today to establish care in the Electrophysiology device clinic.   The patient reports doing very well since having a BiV ICD implanted and remains very active despite her age.  SHe reports occasional fatigue.  She also continues to have dypsnea with moderate activity.  Today, she  denies symptoms of palpitations, chest pain, orthopnea, PND, lower extremity edema, dizziness, presyncope, syncope, or neurologic sequela.  The patientis tolerating medications without difficulties and is otherwise without complaint today.   Past Medical History  Diagnosis Date  . Ischemic cardiomyopathy     severe. Left ventricular ejection fraction 20%.   . Chronic systolic heart failure     NYHA class II.  [name] Chronic pulmonary disease   . BBB (bundle branch block)     s/p BiV ICD implant  . Raynaud's syndrome   . Neuromuscular scoliosis of thoracolumbar region     type of scoliosis was not specified.   [name] DJD (degenerative joint disease), cervical   . DJD (degenerative joint disease), lumbar   . HTN (hypertension)   . Hyperthyroidism     following Graves disease  . Renal artery stenosis     Treated with angioplast in 1980 and 1987.   . S/P CABG (coronary artery bypass graft) April 2012  . FH: mitral valve repair     with 26 mm Edwards ring angioplasty,     Past Surgical History  Procedure Date  . Total abdominal hysterectomy   . Sympathectomy   . Tonsillectomy   . Renal artery ballon dilation   . Rotator cuff repair     right and left  . Laminotomy/foraminotomy     with decompression of the L4 nerve root   . Cervical fusion     C5-6 and C6-7, C4-5 with titanium plates  . Umbilical hernia repair   . Wedge resection 2001    for the right upper lobe for Aspergillus treatement.  Dr. Edwyna Shell apprix 2001.  [name] Ptca     of bilateral renal arteries    . Cataract extraction   . Carpal tunnel release   . Appendectomy   . Breast lumpectomy     left breast  . Renal artery ballon dilation     x2  . Precancerous growth     tops of ear removed. bilateral.   . Coronary artery bypass graft 2010  . Implantation of icd 2010    BiV ICD implant (SJM) by Dr Amil Amen 03/2009    History   Social History  . Marital Status: Married    Spouse Name: N/A    Number of Children: N/A  . Years of Education: N/A   Occupational History  . Not on file.   Social History Main Topics  . Smoking status: Never Smoker   . Smokeless tobacco: Not on file   Comment: passive smoker from birth to age 38 (mom and husband)  . Alcohol Use: No  . Drug Use: Not on file  . Sexually Active: Not on file   Other Topics Concern  . Not on file   Social History Narrative   Married and lives in [place].  Retired    Family History  Problem Relation Age of Onset  . Heart disease Father   . Hypertension Father   . Colon cancer      grandmother  Allergies  Allergen Reactions  . Alprazolam     REACTION: ulcer's in mouth and extreme constipation  . Aspirin     REACTION: upsets stomach  . Calcitonin ([place])     REACTION: rash over entire body  . Cefuroxime Axetil     REACTION: either rash and diarrhea  . Cephalexin     REACTION: rash  . Ciprofloxacin     REACTION: rash  . Clonazepam     REACTION: 1/2 pill makes grogginess next day  . Doxycycline     REACTION: severe rash over entire body  . Erythromycin     REACTION: rash  . Hydrocodone     REACTION: nausea and totally out of it  . Hydrocodone-Acetaminophen     REACTION: reaction forgeotten  . Ketoconazole     REACTION: terribly weak, voice shook  . Oxycodone-Aspirin     REACTION: nausea  . Penicillins     REACTION: rash  . Sulfamethoxazole W/Trimethoprim     REACTION: either rash or diarrhea  . Sulfonamide Derivatives     REACTION: reaction forgotten    Current Outpatient  Prescriptions  Medication Sig Dispense Refill  . Calcium-Vitamin D-Vitamin K (VIACTIV FLAVOR GLIDES) 500-200-40 MG-UNT-MCG TABS Take by mouth 2 (two) times daily.        . carvedilol (COREG) 12.5 MG tablet Take 12.5 mg by mouth 2 (two) times daily.        . Cholecalciferol (VITAMIN D3) 2000 UNITS capsule Take 2,000 Units by mouth daily.        . fluconazole (DIFLUCAN) 100 MG tablet Take 100 mg by mouth once a week.        . levothyroxine (SYNTHROID, LEVOTHROID) 100 MCG tablet Take 100 mcg by mouth daily.        . montelukast (SINGULAIR) 10 MG tablet Take 10 mg by mouth daily.        . nortriptyline (PAMELOR) 25 MG capsule Take 50 mg by mouth daily.        [name] omeprazole (PRILOSEC) 20 MG capsule Take 20 mg by mouth daily.        . simvastatin (ZOCOR) 40 MG tablet Take 40 mg by mouth daily.        [name] DISCONTD: aspirin 81 MG tablet Take 81 mg by mouth daily.        [name] DISCONTD: Inulin (FIBERCHOICE) 2 G CHEW Chew by mouth daily. Take 4 by mouth daily       . DISCONTD: levothyroxine (SYNTHROID, LEVOTHROID) 112 MCG tablet Take 112 mcg by mouth daily.         ROS- all systems are reviewed and negative except as per HPI Physical Exam: Filed Vitals:   03/01/11 1031  BP: 162/70  Pulse: 70  Height: 5' (1.524 m)  Weight: 105 lb (47.628 kg)    GEN- The patient is well appearing, alert and oriented x 3 today.   Head- normocephalic, atraumatic Eyes-  Sclera clear, conjunctiva pink Ears- hearing intact Oropharynx- clear Neck- supple, no JVP Lymph- no cervical lymphadenopathy Lungs- Clear to ausculation bilaterally, normal work of breathing Chest- ICD pocket is well healed Heart- Regular rate and rhythm, no murmurs, rubs or gallops, PMI not laterally displaced GI- soft, NT, ND, + BS Extremities- no clubbing, cyanosis, or edema MS- age appropriate atrophy Skin- no rash or lesion Psych- euthymic mood, full affect Neuro- strength and sensation are intact  ICD interrogation- reviewed in detail  today,  See PACEART report  Assessment and Plan:

## 2011-03-01 NOTE — Assessment & Plan Note (Signed)
Above goal She will follow closely with Dr Unknown Jim restriction

## 2011-03-01 NOTE — Patient Instructions (Signed)
Your physician wants you to follow-up in: 12 months. You will receive a reminder letter in the mail two months in advance. If you don't receive a letter, please call our office to schedule the follow-up appointment.   Remote monitoring is used to monitor your Pacemaker of ICD from home. This monitoring reduces the number of office visits required to check your device to one time per year. It allows [place] to keep an eye on the functioning of your device to ensure it is working properly. You are scheduled for a device check from home on 06/01/2011. You may send your transmission at any time that day. If you have a wireless device, the transmission will be sent automatically. After your physician reviews your transmission, you will receive a postcard with your next transmission date.

## 2011-03-02 ENCOUNTER — Encounter: Payer: Medicare Other | Admitting: Internal Medicine

## 2011-06-01 ENCOUNTER — Other Ambulatory Visit: Payer: Self-pay

## 2011-06-01 ENCOUNTER — Encounter: Payer: Self-pay | Admitting: Internal Medicine

## 2011-06-01 ENCOUNTER — Ambulatory Visit (INDEPENDENT_AMBULATORY_CARE_PROVIDER_SITE_OTHER): Payer: Medicare Other | Admitting: *Deleted

## 2011-06-01 DIAGNOSIS — I428 Other cardiomyopathies: Secondary | ICD-10-CM

## 2011-06-01 LAB — REMOTE ICD DEVICE
BRDY-0002RA: 60 {beats}/min
BRDY-0003RA: 110 {beats}/min
BRDY-0004RA: 110 {beats}/min
LV LEAD IMPEDENCE ICD: 780 Ohm
RV LEAD IMPEDENCE ICD: 380 Ohm
TZAT-0001SLOWVT: 1
TZAT-0012SLOWVT: 200 ms
TZAT-0019SLOWVT: 7.5 V
TZAT-0020SLOWVT: 1 ms
TZON-0004SLOWVT: 30
TZON-0005SLOWVT: 6
TZON-0010SLOWVT: 80 ms
TZST-0001SLOWVT: 4
TZST-0003SLOWVT: 20 J

## 2011-06-12 ENCOUNTER — Encounter: Payer: Self-pay | Admitting: *Deleted

## 2011-06-12 NOTE — Progress Notes (Signed)
icd remote check  

## 2011-06-22 LAB — BASIC METABOLIC PANEL
BUN: 17
CO2: 26
Calcium: 9.2
Chloride: 99
Creatinine, Ser: 0.86
Glucose, Bld: 76

## 2011-06-30 LAB — BASIC METABOLIC PANEL
CO2: 27 mEq/L (ref 19–32)
Calcium: 9.7 mg/dL (ref 8.4–10.5)
Chloride: 104 mEq/L (ref 96–112)
Creatinine, Ser: 0.94 mg/dL (ref 0.4–1.2)
GFR calc Af Amer: >60 mL/min (ref >60)
Glucose, Bld: 101 mg/dL — ABNORMAL HIGH (ref 70–99)

## 2011-07-07 LAB — BASIC METABOLIC PANEL
CO2: 27
Calcium: 8.8
Chloride: 103
Creatinine, Ser: 0.82
GFR calc Af Amer: >60
Glucose, Bld: 75

## 2011-07-07 LAB — POCT HEMOGLOBIN-HEMACUE: Hemoglobin: 12.4

## 2011-08-31 ENCOUNTER — Ambulatory Visit (INDEPENDENT_AMBULATORY_CARE_PROVIDER_SITE_OTHER): Payer: Medicare Other | Admitting: *Deleted

## 2011-08-31 DIAGNOSIS — I428 Other cardiomyopathies: Secondary | ICD-10-CM

## 2011-09-01 ENCOUNTER — Other Ambulatory Visit: Payer: Self-pay | Admitting: Internal Medicine

## 2011-09-01 ENCOUNTER — Encounter: Payer: Self-pay | Admitting: Internal Medicine

## 2011-09-01 LAB — REMOTE ICD DEVICE
AL AMPLITUDE: 2.8 mv
ATRIAL PACING ICD: 48 pct
BAMS-0001: 150 {beats}/min
BAMS-0003: 70 {beats}/min
HV IMPEDENCE: 46 Ohm
RV LEAD IMPEDENCE ICD: 380 Ohm
TZAT-0001SLOWVT: 1
TZAT-0004SLOWVT: 8
TZAT-0012SLOWVT: 200 ms
TZAT-0020SLOWVT: 1 ms
TZST-0001SLOWVT: 3
TZST-0001SLOWVT: 4
TZST-0001SLOWVT: 5
TZST-0003SLOWVT: 20 J
VENTRICULAR PACING ICD: 100 pct

## 2011-09-14 NOTE — Progress Notes (Signed)
Remote icd check  

## 2011-09-21 ENCOUNTER — Encounter: Payer: Self-pay | Admitting: *Deleted

## 2011-09-27 DIAGNOSIS — J309 Allergic rhinitis, unspecified: Secondary | ICD-10-CM | POA: Diagnosis not present

## 2011-10-06 DIAGNOSIS — J309 Allergic rhinitis, unspecified: Secondary | ICD-10-CM | POA: Diagnosis not present

## 2011-10-10 DIAGNOSIS — J309 Allergic rhinitis, unspecified: Secondary | ICD-10-CM | POA: Diagnosis not present

## 2011-10-24 DIAGNOSIS — J309 Allergic rhinitis, unspecified: Secondary | ICD-10-CM | POA: Diagnosis not present

## 2011-11-06 DIAGNOSIS — R5383 Other fatigue: Secondary | ICD-10-CM | POA: Diagnosis not present

## 2011-11-06 DIAGNOSIS — R5381 Other malaise: Secondary | ICD-10-CM | POA: Diagnosis not present

## 2011-11-06 DIAGNOSIS — E782 Mixed hyperlipidemia: Secondary | ICD-10-CM | POA: Diagnosis not present

## 2011-11-06 DIAGNOSIS — I5022 Chronic systolic (congestive) heart failure: Secondary | ICD-10-CM | POA: Diagnosis not present

## 2011-11-06 DIAGNOSIS — I10 Essential (primary) hypertension: Secondary | ICD-10-CM | POA: Diagnosis not present

## 2011-11-07 DIAGNOSIS — J309 Allergic rhinitis, unspecified: Secondary | ICD-10-CM | POA: Diagnosis not present

## 2011-11-14 DIAGNOSIS — J309 Allergic rhinitis, unspecified: Secondary | ICD-10-CM | POA: Diagnosis not present

## 2011-11-22 DIAGNOSIS — J309 Allergic rhinitis, unspecified: Secondary | ICD-10-CM | POA: Diagnosis not present

## 2011-11-28 DIAGNOSIS — J309 Allergic rhinitis, unspecified: Secondary | ICD-10-CM | POA: Diagnosis not present

## 2011-12-05 DIAGNOSIS — J309 Allergic rhinitis, unspecified: Secondary | ICD-10-CM | POA: Diagnosis not present

## 2011-12-07 ENCOUNTER — Ambulatory Visit (INDEPENDENT_AMBULATORY_CARE_PROVIDER_SITE_OTHER): Payer: Medicare Other | Admitting: *Deleted

## 2011-12-07 ENCOUNTER — Encounter: Payer: Self-pay | Admitting: Internal Medicine

## 2011-12-07 DIAGNOSIS — I428 Other cardiomyopathies: Secondary | ICD-10-CM | POA: Diagnosis not present

## 2011-12-07 LAB — REMOTE ICD DEVICE
ATRIAL PACING ICD: 1 pct
BAMS-0001: 150 {beats}/min
BAMS-0003: 70 {beats}/min
BRDY-0002RA: 60 {beats}/min
LV LEAD IMPEDENCE ICD: 740 Ohm
TZAT-0004SLOWVT: 8
TZAT-0012SLOWVT: 200 ms
TZAT-0013SLOWVT: 3
TZAT-0018SLOWVT: NEGATIVE
TZAT-0019SLOWVT: 7.5 V
TZON-0003SLOWVT: 350 ms
TZON-0004SLOWVT: 30
TZST-0001SLOWVT: 3
VENTRICULAR PACING ICD: 100 pct

## 2011-12-12 ENCOUNTER — Encounter: Payer: Self-pay | Admitting: *Deleted

## 2011-12-12 DIAGNOSIS — I10 Essential (primary) hypertension: Secondary | ICD-10-CM | POA: Diagnosis not present

## 2011-12-12 DIAGNOSIS — D509 Iron deficiency anemia, unspecified: Secondary | ICD-10-CM | POA: Diagnosis not present

## 2011-12-12 DIAGNOSIS — E7800 Pure hypercholesterolemia, unspecified: Secondary | ICD-10-CM | POA: Diagnosis not present

## 2011-12-12 DIAGNOSIS — R1314 Dysphagia, pharyngoesophageal phase: Secondary | ICD-10-CM | POA: Diagnosis not present

## 2011-12-12 DIAGNOSIS — J309 Allergic rhinitis, unspecified: Secondary | ICD-10-CM | POA: Diagnosis not present

## 2011-12-12 DIAGNOSIS — E559 Vitamin D deficiency, unspecified: Secondary | ICD-10-CM | POA: Diagnosis not present

## 2011-12-12 DIAGNOSIS — E039 Hypothyroidism, unspecified: Secondary | ICD-10-CM | POA: Diagnosis not present

## 2011-12-13 DIAGNOSIS — I10 Essential (primary) hypertension: Secondary | ICD-10-CM | POA: Diagnosis not present

## 2011-12-13 DIAGNOSIS — E7800 Pure hypercholesterolemia, unspecified: Secondary | ICD-10-CM | POA: Diagnosis not present

## 2011-12-13 DIAGNOSIS — E559 Vitamin D deficiency, unspecified: Secondary | ICD-10-CM | POA: Diagnosis not present

## 2011-12-13 DIAGNOSIS — D509 Iron deficiency anemia, unspecified: Secondary | ICD-10-CM | POA: Diagnosis not present

## 2011-12-13 DIAGNOSIS — E039 Hypothyroidism, unspecified: Secondary | ICD-10-CM | POA: Diagnosis not present

## 2011-12-15 NOTE — Progress Notes (Signed)
Remote defib check  

## 2011-12-26 DIAGNOSIS — J309 Allergic rhinitis, unspecified: Secondary | ICD-10-CM | POA: Diagnosis not present

## 2012-01-04 DIAGNOSIS — S63269A Dislocation of metacarpophalangeal joint of unspecified finger, initial encounter: Secondary | ICD-10-CM | POA: Diagnosis not present

## 2012-01-04 DIAGNOSIS — M19049 Primary osteoarthritis, unspecified hand: Secondary | ICD-10-CM | POA: Diagnosis not present

## 2012-01-04 DIAGNOSIS — M083 Juvenile rheumatoid polyarthritis (seronegative): Secondary | ICD-10-CM | POA: Diagnosis not present

## 2012-01-09 DIAGNOSIS — R1312 Dysphagia, oropharyngeal phase: Secondary | ICD-10-CM | POA: Diagnosis not present

## 2012-01-09 DIAGNOSIS — I10 Essential (primary) hypertension: Secondary | ICD-10-CM | POA: Diagnosis not present

## 2012-01-09 DIAGNOSIS — E039 Hypothyroidism, unspecified: Secondary | ICD-10-CM | POA: Diagnosis not present

## 2012-01-09 DIAGNOSIS — D509 Iron deficiency anemia, unspecified: Secondary | ICD-10-CM | POA: Diagnosis not present

## 2012-01-09 DIAGNOSIS — E7800 Pure hypercholesterolemia, unspecified: Secondary | ICD-10-CM | POA: Diagnosis not present

## 2012-01-09 DIAGNOSIS — E559 Vitamin D deficiency, unspecified: Secondary | ICD-10-CM | POA: Diagnosis not present

## 2012-01-15 DIAGNOSIS — M6530 Trigger finger, unspecified finger: Secondary | ICD-10-CM | POA: Diagnosis not present

## 2012-01-15 DIAGNOSIS — M542 Cervicalgia: Secondary | ICD-10-CM | POA: Diagnosis not present

## 2012-01-15 DIAGNOSIS — M19049 Primary osteoarthritis, unspecified hand: Secondary | ICD-10-CM | POA: Diagnosis not present

## 2012-01-16 DIAGNOSIS — J309 Allergic rhinitis, unspecified: Secondary | ICD-10-CM | POA: Diagnosis not present

## 2012-01-19 DIAGNOSIS — J309 Allergic rhinitis, unspecified: Secondary | ICD-10-CM | POA: Diagnosis not present

## 2012-01-29 DIAGNOSIS — S63269A Dislocation of metacarpophalangeal joint of unspecified finger, initial encounter: Secondary | ICD-10-CM | POA: Diagnosis not present

## 2012-01-29 DIAGNOSIS — M083 Juvenile rheumatoid polyarthritis (seronegative): Secondary | ICD-10-CM | POA: Diagnosis not present

## 2012-01-29 DIAGNOSIS — M19049 Primary osteoarthritis, unspecified hand: Secondary | ICD-10-CM | POA: Diagnosis not present

## 2012-02-08 DIAGNOSIS — J309 Allergic rhinitis, unspecified: Secondary | ICD-10-CM | POA: Diagnosis not present

## 2012-02-16 DIAGNOSIS — I2510 Atherosclerotic heart disease of native coronary artery without angina pectoris: Secondary | ICD-10-CM | POA: Diagnosis not present

## 2012-02-16 DIAGNOSIS — R0602 Shortness of breath: Secondary | ICD-10-CM | POA: Diagnosis not present

## 2012-02-16 DIAGNOSIS — I5022 Chronic systolic (congestive) heart failure: Secondary | ICD-10-CM | POA: Diagnosis not present

## 2012-02-23 DIAGNOSIS — I5022 Chronic systolic (congestive) heart failure: Secondary | ICD-10-CM | POA: Diagnosis not present

## 2012-02-23 DIAGNOSIS — R0602 Shortness of breath: Secondary | ICD-10-CM | POA: Diagnosis not present

## 2012-02-23 DIAGNOSIS — I2510 Atherosclerotic heart disease of native coronary artery without angina pectoris: Secondary | ICD-10-CM | POA: Diagnosis not present

## 2012-02-27 DIAGNOSIS — J309 Allergic rhinitis, unspecified: Secondary | ICD-10-CM | POA: Diagnosis not present

## 2012-02-29 ENCOUNTER — Telehealth: Payer: Self-pay | Admitting: Internal Medicine

## 2012-02-29 DIAGNOSIS — M545 Low back pain: Secondary | ICD-10-CM | POA: Diagnosis not present

## 2012-02-29 NOTE — Telephone Encounter (Signed)
Per [employer] Hire, pt can travel with her ICD.

## 2012-02-29 NOTE — Telephone Encounter (Signed)
New PR[place]:     Patient called in with questions about travel with regards to her pacemaker.  Please call back.

## 2012-03-08 DIAGNOSIS — M083 Juvenile rheumatoid polyarthritis (seronegative): Secondary | ICD-10-CM | POA: Diagnosis not present

## 2012-03-08 DIAGNOSIS — E782 Mixed hyperlipidemia: Secondary | ICD-10-CM | POA: Diagnosis not present

## 2012-03-08 DIAGNOSIS — S63269A Dislocation of metacarpophalangeal joint of unspecified finger, initial encounter: Secondary | ICD-10-CM | POA: Diagnosis not present

## 2012-03-08 DIAGNOSIS — I2510 Atherosclerotic heart disease of native coronary artery without angina pectoris: Secondary | ICD-10-CM | POA: Diagnosis not present

## 2012-03-08 DIAGNOSIS — M19049 Primary osteoarthritis, unspecified hand: Secondary | ICD-10-CM | POA: Diagnosis not present

## 2012-03-08 DIAGNOSIS — I10 Essential (primary) hypertension: Secondary | ICD-10-CM | POA: Diagnosis not present

## 2012-03-12 ENCOUNTER — Other Ambulatory Visit: Payer: Self-pay | Admitting: Orthopedic Surgery

## 2012-03-20 ENCOUNTER — Encounter: Payer: Self-pay | Admitting: Internal Medicine

## 2012-03-20 ENCOUNTER — Ambulatory Visit (INDEPENDENT_AMBULATORY_CARE_PROVIDER_SITE_OTHER): Payer: Medicare Other | Admitting: Internal Medicine

## 2012-03-20 VITALS — BP 110/62 | HR 58 | Resp 18 | Ht 61.0 in | Wt 108.1 lb

## 2012-03-20 DIAGNOSIS — I10 Essential (primary) hypertension: Secondary | ICD-10-CM

## 2012-03-20 DIAGNOSIS — I2589 Other forms of chronic ischemic heart disease: Secondary | ICD-10-CM

## 2012-03-20 DIAGNOSIS — I5022 Chronic systolic (congestive) heart failure: Secondary | ICD-10-CM | POA: Diagnosis not present

## 2012-03-20 LAB — ICD DEVICE OBSERVATION
AL THRESHOLD: 0.75 V
BAMS-0001: 150 {beats}/min
DEVICE MODEL ICD: 702836
RV LEAD AMPLITUDE: 11.6 mv
TZAT-0012SLOWVT: 200 ms
TZAT-0013SLOWVT: 3
TZAT-0018SLOWVT: NEGATIVE
TZON-0004SLOWVT: 30
TZON-0010SLOWVT: 80 ms
TZST-0001SLOWVT: 5
TZST-0003SLOWVT: 36 J

## 2012-03-20 NOTE — Assessment & Plan Note (Addendum)
Stable I have spoken with Dr Anne Fu who states that her recent EF was 45-50%.  She has planned follow-up with him soon.  Normal BiV ICD function, >99% BiV paced  See Pace Art report AV and VV pacing reprogrammed to optimize CRT today   I am concerned that dypsnea may be more related to lung disease Would consider repeat PFTs or follow-up with Pulmonary

## 2012-03-20 NOTE — Assessment & Plan Note (Signed)
stable °

## 2012-03-20 NOTE — Patient Instructions (Signed)
Remote monitoring is used to monitor your Pacemaker of ICD from home. This monitoring reduces the number of office visits required to check your device to one time per year. It allows [place] to keep an eye on the functioning of your device to ensure it is working properly. You are scheduled for a device check from home on June 17, 2012. You may send your transmission at any time that day. If you have a wireless device, the transmission will be sent automatically. After your physician reviews your transmission, you will receive a postcard with your next transmission date.  Your physician wants you to follow-up in: 1 year with Dr Johney Frame.  You will receive a reminder letter in the mail two months in advance. If you don't receive a letter, please call our office to schedule the follow-up appointment.

## 2012-03-20 NOTE — Progress Notes (Signed)
. PCP: Pearla Dubonnet, MD Primary Cardiologist:  Dr Anne Fu  [name] is a 78 y.o. female who presents today for routine electrophysiology followup. She reports stable but significant fatigue.  She is SOB with minimal activity.  This has been going on for a while and is currently being evaluated by Drs Anne Fu and Kevan Ny.  Today, she denies symptoms of palpitations, chest pain,    lower extremity edema, dizziness, presyncope, syncope, or ICD shocks.  The patient is otherwise without complaint today.   Past Medical History  Diagnosis Date  . Ischemic cardiomyopathy     severe. Left ventricular ejection fraction 20%.   . Chronic systolic heart failure     NYHA class II.  [name] Chronic pulmonary disease   . BBB (bundle branch block)     s/p BiV ICD implant  . Raynaud's syndrome   . Neuromuscular scoliosis of thoracolumbar region     type of scoliosis was not specified.   [name] DJD (degenerative joint disease), cervical   . DJD (degenerative joint disease), lumbar   . HTN (hypertension)   . Hyperthyroidism     following Graves disease  . Renal artery stenosis     Treated with angioplast in 1980 and 1987.   . S/P CABG (coronary artery bypass graft) April 2012  . FH: mitral valve repair     with 26 mm Edwards ring angioplasty,    Past Surgical History  Procedure Date  . Total abdominal hysterectomy   . Sympathectomy   . Tonsillectomy   . Renal artery ballon dilation   . Rotator cuff repair     right and left  . Laminotomy/foraminotomy     with decompression of the L4 nerve root   . Cervical fusion     C5-6 and C6-7, C4-5 with titanium plates  . Umbilical hernia repair   . Wedge resection 2001    for the right upper lobe for Aspergillus treatement.  Dr. Edwyna Shell apprix 2001.  [name] Ptca     of bilateral renal arteries  . Cataract extraction   . Carpal tunnel release   . Appendectomy   . Breast lumpectomy     left breast  . Renal artery ballon dilation     x2  . Precancerous  growth     tops of ear removed. bilateral.   . Coronary artery bypass graft 2010  . Implantation of icd 2010    BiV ICD implant ([facility]) by Dr Amil Amen 03/2009    Current Outpatient Prescriptions  Medication Sig Dispense Refill  . Calcium-Vitamin D-Vitamin K (VIACTIV FLAVOR GLIDES) 500-200-40 MG-UNT-MCG TABS Take by mouth 2 (two) times daily.        . carvedilol (COREG) 12.5 MG tablet Take 12.5 mg by mouth 2 (two) times daily.        . Cholecalciferol (VITAMIN D3) 2000 UNITS capsule Take 2,000 Units by mouth daily.        . fluconazole (DIFLUCAN) 100 MG tablet Take 100 mg by mouth once a week.        . levothyroxine (SYNTHROID, LEVOTHROID) 100 MCG tablet Take 100 mcg by mouth daily.        . montelukast (SINGULAIR) 10 MG tablet Take 10 mg by mouth daily.        . nortriptyline (PAMELOR) 25 MG capsule Take 50 mg by mouth daily.        . pantoprazole (PROTONIX) 40 MG tablet       . simvastatin (ZOCOR) 40 MG tablet  Take 40 mg by mouth daily.        . diclofenac (VOLTAREN) 50 MG EC tablet Take 50 mg by mouth.       . misoprostol (CYTOTEC) 200 MCG tablet       . omeprazole (PRILOSEC) 20 MG capsule Take 20 mg by mouth daily.          Physical Exam: Filed Vitals:   03/20/12 1122  BP: 110/62  Pulse: 58  Resp: 18  Height: 5\' 1"  (1.549 m)  Weight: 108 lb 1.9 oz (49.043 kg)    GEN- The patient is well appearing, alert and oriented x 3 today.   Head- normocephalic, atraumatic Eyes-  Sclera clear, conjunctiva pink Ears- hearing intact Oropharynx- clear Lungs- Clear to ausculation bilaterally, normal work of breathing Chest- ICD pocket is well healed Heart- Regular rate and rhythm, no murmurs, rubs or gallops, PMI not laterally displaced GI- soft, NT, ND, + BS Extremities- no clubbing, cyanosis, or edema  ICD interrogation- reviewed in detail today,  See PACEART report  ekg today reveals AV pacing, QRS is 110 msec with CRT  Assessment and Plan:

## 2012-03-22 ENCOUNTER — Encounter (HOSPITAL_BASED_OUTPATIENT_CLINIC_OR_DEPARTMENT_OTHER): Payer: Self-pay | Admitting: Anesthesiology

## 2012-03-26 ENCOUNTER — Encounter (HOSPITAL_BASED_OUTPATIENT_CLINIC_OR_DEPARTMENT_OTHER): Admission: RE | Payer: Self-pay | Source: Ambulatory Visit

## 2012-03-26 ENCOUNTER — Ambulatory Visit (HOSPITAL_BASED_OUTPATIENT_CLINIC_OR_DEPARTMENT_OTHER): Admission: RE | Admit: 2012-03-26 | Payer: Medicare Other | Source: Ambulatory Visit | Admitting: Orthopedic Surgery

## 2012-03-26 DIAGNOSIS — J309 Allergic rhinitis, unspecified: Secondary | ICD-10-CM | POA: Diagnosis not present

## 2012-03-26 SURGERY — ARTHROPLASTY, FINGER
Anesthesia: Choice | Laterality: Right

## 2012-03-26 NOTE — Addendum Note (Signed)
Addended by: Burnice Logan on: 03/26/2012 03:13 PM   Modules accepted: Orders

## 2012-04-02 DIAGNOSIS — D509 Iron deficiency anemia, unspecified: Secondary | ICD-10-CM | POA: Diagnosis not present

## 2012-04-02 DIAGNOSIS — Z0000 Encounter for general adult medical examination without abnormal findings: Secondary | ICD-10-CM | POA: Diagnosis not present

## 2012-04-02 DIAGNOSIS — I10 Essential (primary) hypertension: Secondary | ICD-10-CM | POA: Diagnosis not present

## 2012-04-02 DIAGNOSIS — E782 Mixed hyperlipidemia: Secondary | ICD-10-CM | POA: Diagnosis not present

## 2012-04-02 DIAGNOSIS — I2510 Atherosclerotic heart disease of native coronary artery without angina pectoris: Secondary | ICD-10-CM | POA: Diagnosis not present

## 2012-04-02 DIAGNOSIS — I5022 Chronic systolic (congestive) heart failure: Secondary | ICD-10-CM | POA: Diagnosis not present

## 2012-04-02 DIAGNOSIS — R0602 Shortness of breath: Secondary | ICD-10-CM | POA: Diagnosis not present

## 2012-04-02 DIAGNOSIS — M542 Cervicalgia: Secondary | ICD-10-CM | POA: Diagnosis not present

## 2012-04-02 DIAGNOSIS — Z1331 Encounter for screening for depression: Secondary | ICD-10-CM | POA: Diagnosis not present

## 2012-04-13 DIAGNOSIS — M542 Cervicalgia: Secondary | ICD-10-CM | POA: Diagnosis not present

## 2012-04-16 DIAGNOSIS — J309 Allergic rhinitis, unspecified: Secondary | ICD-10-CM | POA: Diagnosis not present

## 2012-04-16 DIAGNOSIS — M542 Cervicalgia: Secondary | ICD-10-CM | POA: Diagnosis not present

## 2012-04-23 DIAGNOSIS — M542 Cervicalgia: Secondary | ICD-10-CM | POA: Diagnosis not present

## 2012-04-30 DIAGNOSIS — M542 Cervicalgia: Secondary | ICD-10-CM | POA: Diagnosis not present

## 2012-04-30 DIAGNOSIS — J309 Allergic rhinitis, unspecified: Secondary | ICD-10-CM | POA: Diagnosis not present

## 2012-05-02 DIAGNOSIS — L259 Unspecified contact dermatitis, unspecified cause: Secondary | ICD-10-CM | POA: Diagnosis not present

## 2012-05-07 DIAGNOSIS — J309 Allergic rhinitis, unspecified: Secondary | ICD-10-CM | POA: Diagnosis not present

## 2012-05-13 DIAGNOSIS — M542 Cervicalgia: Secondary | ICD-10-CM | POA: Diagnosis not present

## 2012-05-14 DIAGNOSIS — J309 Allergic rhinitis, unspecified: Secondary | ICD-10-CM | POA: Diagnosis not present

## 2012-05-16 ENCOUNTER — Ambulatory Visit (INDEPENDENT_AMBULATORY_CARE_PROVIDER_SITE_OTHER): Payer: Medicare Other | Admitting: Internal Medicine

## 2012-05-16 ENCOUNTER — Encounter: Payer: Self-pay | Admitting: Internal Medicine

## 2012-05-16 VITALS — BP 138/82 | HR 77 | Temp 98.0°F | Ht 60.0 in | Wt 111.8 lb

## 2012-05-16 DIAGNOSIS — R0602 Shortness of breath: Secondary | ICD-10-CM

## 2012-05-16 DIAGNOSIS — Z01811 Encounter for preprocedural respiratory examination: Secondary | ICD-10-CM | POA: Diagnosis not present

## 2012-05-16 NOTE — Patient Instructions (Addendum)
#  preoperative pulmonary risk for hand surgery  - assuming your hemoglobin, kidney test and albumin levels are normal in past year then you would be low risk for surgery  - However, I need to know these results to formally certify risk; my CMA will get them from Dr Kevan Ny office  #Shortness of breath and oxygen levels  - You again dropped oxygen levels with walking. In 2010 we thought this was due to circulation issues in the hand and not a true drop. If we need to look into this again, you would need abg blood test with exertion. We can discuss reassessment of this when we discuss your risk after I review lab results from Dr Kevan Ny  #followup Based on lab review from Dr Kevan Ny office

## 2012-05-16 NOTE — Progress Notes (Signed)
Subjective:    Patient ID: [name], female    DOB: 06-08-1933, 78 y.o.   MRN: [id number]  HPI  #Hx of passive smoking   - 1ppd mom and husband  #Hx of Mitral Valve repair with CABG - April 2010 and AICD in July 2010  #Hx of  Raynauds and GERD without sclerosis of skin  #. Chronic obstructive pulmonary disease. vs Asthma NOS per Hx - per patient: dxed by Dr. Tuleta Callas late 1990s but apparently dx rescinded in 2001 by Dr. Edwyna Shell - on singulair for years and reportedly told by Dr. Lazarus Salines at somepoin "never come off it" - normal pft 2010 except for small airways  #Chronic dyspnea - since early to mid 2000s  - multifactorial  -  Wedge resection for the right upper lobe for Aspergillus treatment-2001   -chronic systolic chf:  ef 25% in 2010 with et 6-7 steps, improved to 50% in 2013 with ET to inclines  - baseline scoliosis  -  Emphysema nos - seen on CT 2010 (passive smoker)  -- normal pft 2010 except for small airways  - failed MDI and rehab Rx in 2009-2010  - concern for exertional desaturation with pulse ox but not borne out in exertional ABG in 2010    OV 05/16/2012 78 year old lady. Seeing her after nearly 3 years. Last seen in 2010. At that time seen for dyspnea (as outlined above). There was concern about exertional hypoxemia despite normal DLCO on PFT. ABG Sept 2010 both pre- and immediate post exercise showed normal P02. So circulatory impairment in skin was considered etiology for lower pulse ox.. This was subsequently conformed in rehab. Since then overall dyspnea some better  But still dyspneic. Per cardiology DR Allred notes Vi 2013 - ef has improved to 45-50%. Concomitant with this there seems to be some improvement in dysonea but she is unsure. She still dysponeic for walking inclines but no dyspnea for groceries, slow 1 flight sttairs, ADLs at home. Dyspnea immproed by rest but never by prior trial of different mdi or even physical therapy.  Denies cough, orthopnea,  wheeze, edema, other resp issues.  Current main issue is : preop clearance for finger arthroplasty by Dr Merlyn Lot. . She is unsure why she needs preop clearance. She feels she is low risk for hand surrgery because she has had many surgeries (see below) in past without anesthesia issues. . However,  but I am presuming on account of finger pulse ox exetional hypxoemia that she is known to have (see 2010 notes, and today 185 feet 1 lap desaturated to 76% but poor wave form on meter) and considered due to poor circulation in 2010 (because exertional aBG was fine in 2010). She has had many surgeries in past (outlined below) all without complications  Surgical History: Total Abdominal Hysterectomy Sympathectomy Tonsillectomy Renal artery ballon dilation Rotator Cuff Repair-right and left Laminotomy/Foraminotomy with decompression of the L4 nerve root Cervical Fusion-C5-6 and C6-7, C4-5 with titanium plates Umbilical hernia repair Wedge resection for the right upper lobe for Aspergillus treatment-2001 PTCA of bilateraol renal arteries Cataract Extraction Carpal Tunnel Release Appendectomy Lumpectomy-left breast renal artery balloon dilation x 2 Tops or ear removed for precancerous growths-bilateral CABG-2010 Implantation of ICD 2010   Her Pulmonary Complication Risk Score is evaluated and outline below  Arozullah Postperative Pulmonary Risk Score   Type of surgery - abd ao aneurysm (27), thoracic (21), neurosurgery / upper abdominal / vascular (21), neck (11) 0  Emergency Surgery - (11)  0  ALbumin < 3 or poor nutritional state - (9) 0 (was 3.9 in 2010, expect normal clinically currently)  BUN > 30 -  (8) 0 (was 21 in march 2013, pmd office)  Partial or completely dependent functional status - (7) 0  COPD -  (6) 6 (emphysema CT in 2008)  Age - 60 to 12 (4), > 70  (6) 6  TOTAL 12  Risk Stratifcation scores  - < 10, 11-19, 20-27, 28-40, >40 Low Risk     CANET Postperative Pulmonary Risk  Score   Age - <50 (0), 50-80 (3), >80 (16) 3 (age 7)  Preoperative pulse ox - >96 (0), 91-95 (8), <90 (24) 0 (pulse ox 97%)  Respiratory infection in last month - Yes (17) 0  Preoperative anemia - < 10gm% - Yes (11) 0 (13.3gm% -mar 2013 pmd office)  Surgical incision - Upper abdominal (15), Thoracic (24) 0 (upper limb)  Duration of surgery - <2h (0), 2-3h (16), >3h (23) 16 (< 3h presumed)  Emergency Surgery - Yes (8) 0  - No  TOTAL 19  Risk Stratification - Low (<26), Intermediate (26-44), High (>45) LOW       Current outpatient prescriptions:Calcium-Vitamin D-Vitamin K (VIACTIV FLAVOR GLIDES) 500-200-40 MG-UNT-MCG TABS, Take by mouth 2 (two) times daily.  , Disp: , Rfl: ;  carvedilol (COREG) 12.5 MG tablet, Take 12.5 mg by mouth 2 (two) times daily.  , Disp: , Rfl: ;  Cholecalciferol (VITAMIN D3) 2000 UNITS capsule, Take 2,000 Units by mouth daily.  , Disp: , Rfl: ;  fluconazole (DIFLUCAN) 100 MG tablet, Take 100 mg by mouth once a week.  , Disp: , Rfl:  levothyroxine (SYNTHROID, LEVOTHROID) 100 MCG tablet, Take 100 mcg by mouth daily.  , Disp: , Rfl: ;  montelukast (SINGULAIR) 10 MG tablet, Take 10 mg by mouth daily.  , Disp: , Rfl: ;  nortriptyline (PAMELOR) 25 MG capsule, Take 30 mg by mouth daily. , Disp: , Rfl: ;  pantoprazole (PROTONIX) 40 MG tablet, , Disp: , Rfl: ;  simvastatin (ZOCOR) 40 MG tablet, Take 40 mg by mouth daily.  , Disp: , Rfl:  triamcinolone cream (KENALOG) 0.1 %, As directed, Disp: , Rfl: ;  misoprostol (CYTOTEC) 200 MCG tablet, , Disp: , Rfl:    Past, Family, Social reviewed: no change since last visit   Review of Systems  Constitutional: Negative for fever and unexpected weight change.  HENT: Negative for ear pain, nosebleeds, congestion, sore throat, rhinorrhea, sneezing, trouble swallowing, dental problem, postnasal drip and sinus pressure.   Eyes: Negative for redness and itching.  Respiratory: Negative for cough, chest tightness, shortness of breath and  wheezing.   Cardiovascular: Negative for palpitations and leg swelling.  Gastrointestinal: Negative for nausea and vomiting.  Genitourinary: Negative for dysuria.  Musculoskeletal: Negative for joint swelling.  Skin: Negative for rash.  Neurological: Negative for headaches.  Hematological: Does not bruise/bleed easily.  Psychiatric/Behavioral: Negative for dysphoric mood. The patient is not nervous/anxious.        Objective:   Physical Exam Physical Exam  General:  thin.  thin.  Body mass index is 21.83 kg/(m^2). Head:  normocephalic and atraumatic Eyes:  PERRLA/EOM intact; conjunctiva and sclera clear Ears:  TMs intact and clear with normal canals Nose:  no deformity, discharge, inflammation, or lesions Mouth:  no deformity or lesions Neck:  no masses, thyromegaly, or abnormal cervical nodes Chest Wall:  scar of surgery pluse Lungs:  decreased BS bilateral.  clear Heart:  regular rate and rhythm, S1, S2 without murmurs, rubs, gallops, or clicks  click of mitral valve + Abdomen:  bowel sounds positive; abdomen soft and non-tender without masses, or organomegaly Msk:  no deformity or scoliosis noted with normal posture Pulses:  pulses normal Extremities:  no clubbing, cyanosis, edema, or deformity noted Neurologic:  CN II-XII grossly intact with normal reflexes, coordination, muscle strength and tone Skin:  intact without lesions or rashes no scleroderma Cervical Nodes:  no significant adenopathy Axillary Nodes:  no significant adenopathy Psych:  alert and cooperative; normal mood and affect; normal attention span and concentration          Assessment & Plan:

## 2012-05-17 ENCOUNTER — Telehealth: Payer: Self-pay | Admitting: Internal Medicine

## 2012-05-17 DIAGNOSIS — R0600 Dyspnea, unspecified: Secondary | ICD-10-CM

## 2012-05-17 DIAGNOSIS — M542 Cervicalgia: Secondary | ICD-10-CM | POA: Diagnosis not present

## 2012-05-17 DIAGNOSIS — Z01811 Encounter for preprocedural respiratory examination: Secondary | ICD-10-CM | POA: Insufficient documentation

## 2012-05-17 NOTE — Telephone Encounter (Signed)
Spoke with [name]. She is asking if MR has received the labs from Dr. Judithann Sauger office yet and if so if she is okay to proceed with her hand surgery. Please advise, thanks!

## 2012-05-17 NOTE — Assessment & Plan Note (Addendum)
Exertional hypoxemia likely related to circulatory issues on skin as show in 2010 but she still has class 2-3 dyspnea despite ef improving from 24% to 50%. I think it is worth haivng a re-look but this can be done after hand surgery. She will see me in 2 months (as d/w her on phone a day later)

## 2012-05-17 NOTE — Assessment & Plan Note (Addendum)
   Arozullah Postperative Pulmonary Risk Score   Type of surgery - abd ao aneurysm (27), thoracic (21), neurosurgery / upper abdominal / vascular (21), neck (11) 0  Emergency Surgery - (11) 0  ALbumin < 3 or poor nutritional state - (9) 0 (was 3.9 in 2010, expect normal clinically currently)  BUN > 30 -  (8) 0 (was 21 in march 2013, pmd office)  Partial or completely dependent functional status - (7) 0  COPD -  (6) 6 (emphysema CT in 2008)  Age - 60 to 65 (4), > 70  (6) 6  TOTAL 12  Risk Stratifcation scores  - < 10, 11-19, 20-27, 28-40, >40 Low Risk     CANET Postperative Pulmonary Risk Score   Age - <50 (0), 50-80 (3), >80 (16) 3 (age 78)  Preoperative pulse ox - >96 (0), 91-95 (8), <90 (24) 0 (pulse ox 97%)  Respiratory infection in last month - Yes (17) 0  Preoperative anemia - < 10gm% - Yes (11) 0 (13.3gm% -mar 2013 pmd office)  Surgical incision - Upper abdominal (15), Thoracic (24) 0 (upper limb)  Duration of surgery - <2h (0), 2-3h (16), >3h (23) 16 (< 3h presumed)  Emergency Surgery - Yes (8) 0  - No  TOTAL 19  Risk Stratification - Low (<26), Intermediate (26-44), High (>45) LOW      Both scoring systems indicate LOW risk for pulmonary complications following hand surgeyr  counseleed about pulmonary risks folloing surgery inclding pna, ddvt, prolonged ventilator dependence. She wishes to proceed with hand surgery  RECOMMEND  - early mobilization  - dvt prophylaxis  - incentive spirometry  - standard post op care

## 2012-05-17 NOTE — Telephone Encounter (Signed)
LMTCB

## 2012-05-17 NOTE — Telephone Encounter (Signed)
Called patient and discussed   - Low risk from pulmonary complications for hand surgery. I told her that. I will do my note and send to dr Merlyn Lot office before 05/19/12   - DysPnea: she has expressed interest in having her exertional dyspnea worked up. This is is chronic issue. Please give her appt in 1-2 months to see me; needs full PFT (ordered)

## 2012-05-20 DIAGNOSIS — IMO0002 Reserved for concepts with insufficient information to code with codable children: Secondary | ICD-10-CM | POA: Diagnosis not present

## 2012-05-20 DIAGNOSIS — W19XXXA Unspecified fall, initial encounter: Secondary | ICD-10-CM | POA: Diagnosis not present

## 2012-05-20 NOTE — Telephone Encounter (Signed)
Pt aware and OV scheduled. Carron Curie, CMA

## 2012-05-21 DIAGNOSIS — M542 Cervicalgia: Secondary | ICD-10-CM | POA: Diagnosis not present

## 2012-05-21 DIAGNOSIS — J309 Allergic rhinitis, unspecified: Secondary | ICD-10-CM | POA: Diagnosis not present

## 2012-05-24 ENCOUNTER — Other Ambulatory Visit: Payer: Self-pay | Admitting: Internal Medicine

## 2012-05-24 DIAGNOSIS — M25559 Pain in unspecified hip: Secondary | ICD-10-CM

## 2012-05-24 DIAGNOSIS — M542 Cervicalgia: Secondary | ICD-10-CM | POA: Diagnosis not present

## 2012-05-28 ENCOUNTER — Ambulatory Visit
Admission: RE | Admit: 2012-05-28 | Discharge: 2012-05-28 | Disposition: A | Discharge status: Home / Self Care | Payer: Medicare Other | Source: Ambulatory Visit | Attending: Internal Medicine | Admitting: Internal Medicine

## 2012-05-28 DIAGNOSIS — M25559 Pain in unspecified hip: Secondary | ICD-10-CM

## 2012-05-28 DIAGNOSIS — J309 Allergic rhinitis, unspecified: Secondary | ICD-10-CM | POA: Diagnosis not present

## 2012-05-31 DIAGNOSIS — M542 Cervicalgia: Secondary | ICD-10-CM | POA: Diagnosis not present

## 2012-06-10 DIAGNOSIS — M19049 Primary osteoarthritis, unspecified hand: Secondary | ICD-10-CM | POA: Diagnosis not present

## 2012-06-10 DIAGNOSIS — S63269A Dislocation of metacarpophalangeal joint of unspecified finger, initial encounter: Secondary | ICD-10-CM | POA: Diagnosis not present

## 2012-06-18 DIAGNOSIS — J309 Allergic rhinitis, unspecified: Secondary | ICD-10-CM | POA: Diagnosis not present

## 2012-06-20 ENCOUNTER — Ambulatory Visit (INDEPENDENT_AMBULATORY_CARE_PROVIDER_SITE_OTHER): Payer: Medicare Other | Admitting: Internal Medicine

## 2012-06-20 ENCOUNTER — Encounter: Payer: Self-pay | Admitting: Internal Medicine

## 2012-06-20 VITALS — BP 116/78 | HR 65 | Temp 97.6°F | Ht 60.0 in | Wt 113.0 lb

## 2012-06-20 DIAGNOSIS — R0602 Shortness of breath: Secondary | ICD-10-CM

## 2012-06-20 NOTE — Assessment & Plan Note (Signed)
Your breathing test is normal.  YOur shortness of breath is likely due to fitness, heart, spine curvature and prior lung resection The oxygen drop they see with your finger is likely an artifact tied to poor circulation As discussed we will continue to observe vs option of doing CPST pulmnary stress test now I will see you back in 1 year or sooner if needed

## 2012-06-20 NOTE — Progress Notes (Signed)
Subjective:    Patient ID: [name], female    DOB: [birth year], 78 y.o.   MRN: [id number]  HPI #Hx of passive smoking   - 1ppd mom and husband  #Hx of Mitral Valve repair with CABG - April 2010 and AICD in July 2010  #Hx of  Raynauds and GERD without sclerosis of skin  #. Chronic obstructive pulmonary disease. vs Asthma NOS per Hx - per patient: dxed by Dr. Baker Callas late 1990s but apparently dx rescinded in 2001 by Dr. Edwyna Shell - on singulair for years and reportedly told by Dr. Lazarus Salines at somepoin "never come off it" - normal pft 2010 except for small airways  #Chronic dyspnea - since early to mid 2000s  - multifactorial  -  Wedge resection for the right upper lobe for Aspergillus treatment-2001   -chronic systolic chf:  ef 25% in 2010 with et 6-7 steps, improved to 50% in 2013 with ET to inclines  - baseline scoliosis  -  Emphysema nos - seen on CT 2010 (passive smoker)  -- normal pft 2010 except for small airways  - failed MDI and rehab Rx in 2009-2010  - concern for exertional desaturation with pulse ox but not borne out in exertional ABG in 2010    OV 05/16/2012 78 year old lady. Seeing her after nearly 3 years. Last seen in 2010. At that time seen for dyspnea (as outlined above). There was concern about exertional hypoxemia despite normal DLCO on PFT. ABG Sept 2010 both pre- and immediate post exercise showed normal P02. So circulatory impairment in skin was considered etiology for lower pulse ox.. This was subsequently conformed in rehab. Since then overall dyspnea some better  But still dyspneic. Per cardiology DR Allred notes Jerlyn 2013 - ef has improved to 45-50%. Concomitant with this there seems to be some improvement in dysonea but she is unsure. She still dysponeic for walking inclines but no dyspnea for groceries, slow 1 flight sttairs, ADLs at home. Dyspnea immproed by rest but never by prior trial of different mdi or even physical therapy.  Denies cough, orthopnea,  wheeze, edema, other resp issues.  Current main issue is : preop clearance for finger arthroplasty by Dr Merlyn Lot. . She is unsure why she needs preop clearance. She feels she is low risk for hand surrgery because she has had many surgeries (see below) in past without anesthesia issues. . However,  but I am presuming on account of finger pulse ox exetional hypxoemia that she is known to have (see 2010 notes, and today 185 feet 1 lap desaturated to 76% but poor wave form on meter) and considered due to poor circulation in 2010 (because exertional aBG was fine in 2010). She has had many surgeries in past (outlined below) all without complications  Surgical History: Total Abdominal Hysterectomy Sympathectomy Tonsillectomy Renal artery ballon dilation Rotator Cuff Repair-right and left Laminotomy/Foraminotomy with decompression of the L4 nerve root Cervical Fusion-C5-6 and C6-7, C4-5 with titanium plates Umbilical hernia repair Wedge resection for the right upper lobe for Aspergillus treatment-2001 PTCA of bilateraol renal arteries Cataract Extraction Carpal Tunnel Release Appendectomy Lumpectomy-left breast renal artery balloon dilation x 2 Tops or ear removed for precancerous growths-bilateral CABG-2010 Implantation of ICD 2010   Her Pulmonary Complication Risk Score is evaluated and outline below  Arozullah Postperative Pulmonary Risk Score   Type of surgery - abd ao aneurysm (27), thoracic (21), neurosurgery / upper abdominal / vascular (21), neck (11) 0  Emergency Surgery - (11) 0  ALbumin < 3 or poor nutritional state - (9) 0 (was 3.9 in 2010, expect normal clinically currently)  BUN > 30 -  (8) 0 (was 21 in march 2013, pmd office)  Partial or completely dependent functional status - (7) 0  COPD -  (6) 6 (emphysema CT in 2008)  Age - 60 to 20 (4), > 70  (6) 6  TOTAL 12  Risk Stratifcation scores  - < 10, 11-19, 20-27, 28-40, >40 Low Risk     CANET Postperative Pulmonary Risk  Score   Age - <50 (0), 50-80 (3), >80 (16) 3 (age 80)  Preoperative pulse ox - >96 (0), 91-95 (8), <90 (24) 0 (pulse ox 97%)  Respiratory infection in last month - Yes (17) 0  Preoperative anemia - < 10gm% - Yes (11) 0 (13.3gm% -mar 2013 pmd office)  Surgical incision - Upper abdominal (15), Thoracic (24) 0 (upper limb)  Duration of surgery - <2h (0), 2-3h (16), >3h (23) 16 (< 3h presumed)  Emergency Surgery - Yes (8) 0  - No  TOTAL 19  Risk Stratification - Low (<26), Intermediate (26-44), High (>45) LOW      REC #preoperative pulmonary risk for hand surgery  - assuming your hemoglobin, kidney test and albumin levels are normal in past year then you would be low risk for surgery  - However, I need to know these results to formally certify risk; my CMA will get them from Dr Kevan Ny office  #Shortness of breath and oxygen levels  - You again dropped oxygen levels with walking. In 2010 we thought this was due to circulation issues in the hand and not a true drop. If we need to look into this again, you would need abg blood test with exertion. We can discuss reassessment of this when we discuss your risk after I review lab results from Dr Kevan Ny  #followup  Based on lab review from Dr Kevan Ny office  OV 06/20/2012 Dyspnea followup. No no new issues since last visit 05/16/12. YEst to have hand surgery due to schedule issues. Still has dyspnea on exertion which is unchanged. Rates it as mild and stable. Definitely exertional. Definitely relieved by rest. Dyspnea brought on by walking incline, or 7-8 steps walkign fast but no dyspnea for groceries, change in clothes, eating, doing ADLs. In past pulmonary rehab did not help. In past MDIs did not work  PFT 06/20/12: fev1 1.3L/88%, Ratop 66. No BD resoinse. TLC 3/7L/93%, DLCO 11.6/82% - NORMAL  She does not want flu shot today   Past, Family, Social reviewed: no change since last visit   Review of Systems  Constitutional: Negative for fever and  unexpected weight change.  HENT: Negative for ear pain, nosebleeds, congestion, sore throat, rhinorrhea, sneezing, trouble swallowing, dental problem, postnasal drip and sinus pressure.   Eyes: Negative for redness and itching.  Respiratory: Negative for cough, chest tightness, shortness of breath and wheezing.   Cardiovascular: Negative for palpitations and leg swelling.  Gastrointestinal: Negative for nausea and vomiting.  Genitourinary: Negative for dysuria.  Musculoskeletal: Negative for joint swelling.  Skin: Negative for rash.  Neurological: Negative for headaches.  Hematological: Does not bruise/bleed easily.  Psychiatric/Behavioral: Negative for dysphoric mood. The patient is not nervous/anxious.        Objective:   Physical Exam General:  thin.  thin.  Body mass index is 21.83 kg/(m^2). Head:  normocephalic and atraumatic Eyes:  PERRLA/EOM intact; conjunctiva and sclera clear Ears:  TMs intact and clear with normal  canals Nose:  no deformity, discharge, inflammation, or lesions Mouth:  no deformity or lesions Neck:  no masses, thyromegaly, or abnormal cervical nodes Chest Wall:  scar of surgery pluse Lungs:  decreased BS bilateral.  clear Heart:  regular rate and rhythm, S1, S2 without murmurs, rubs, gallops, or clicks  click of mitral valve + Abdomen:  bowel sounds positive; abdomen soft and non-tender without masses, or organomegaly Msk:  no deformity or scoliosis noted with normal posture Pulses:  pulses normal Extremities:  no clubbing, cyanosis, edema, or deformity noted Neurologic:  CN II-XII grossly intact with normal reflexes, coordination, muscle strength and tone Skin:  intact without lesions or rashes no scleroderma Cervical Nodes:  no significant adenopathy Axillary Nodes:  no significant adenopathy Psych:  alert and cooperative; normal mood and affect; normal attention span and concentration         Assessment & Plan:

## 2012-06-20 NOTE — Patient Instructions (Addendum)
Your breathing test is normal.  YOur shortness of breath is likely due to fitness, heart, spine curvature and prior lung resection The oxygen drop they see with your finger is likely an artifact tied to poor circulation As discussed we will continue to observe I will see you back in 1 year or sooner if needed

## 2012-06-20 NOTE — Progress Notes (Signed)
PFT done today. 

## 2012-06-24 ENCOUNTER — Encounter: Payer: Self-pay | Admitting: *Deleted

## 2012-06-24 ENCOUNTER — Ambulatory Visit (INDEPENDENT_AMBULATORY_CARE_PROVIDER_SITE_OTHER): Payer: Medicare Other | Admitting: *Deleted

## 2012-06-24 ENCOUNTER — Encounter: Payer: Self-pay | Admitting: Internal Medicine

## 2012-06-24 DIAGNOSIS — Z95810 Presence of automatic (implantable) cardiac defibrillator: Secondary | ICD-10-CM | POA: Insufficient documentation

## 2012-06-24 DIAGNOSIS — I428 Other cardiomyopathies: Secondary | ICD-10-CM

## 2012-06-24 LAB — REMOTE ICD DEVICE
ATRIAL PACING ICD: 65 pct
BATTERY VOLTAGE: 2.66 V
DEVICE MODEL ICD: 702836
LV LEAD IMPEDENCE ICD: 680 Ohm
RV LEAD AMPLITUDE: 8.8 mv
TZAT-0018SLOWVT: NEGATIVE
TZAT-0019SLOWVT: 7.5 V
TZON-0003SLOWVT: 350 ms
TZON-0010SLOWVT: 80 ms
TZST-0001SLOWVT: 2
TZST-0003SLOWVT: 36 J
VENTRICULAR PACING ICD: 98 pct

## 2012-06-25 NOTE — Progress Notes (Signed)
Remote defib check  

## 2012-06-27 DIAGNOSIS — M1710 Unilateral primary osteoarthritis, unspecified knee: Secondary | ICD-10-CM | POA: Diagnosis not present

## 2012-06-27 DIAGNOSIS — J309 Allergic rhinitis, unspecified: Secondary | ICD-10-CM | POA: Diagnosis not present

## 2012-07-01 ENCOUNTER — Other Ambulatory Visit: Payer: Self-pay | Admitting: Orthopedic Surgery

## 2012-07-02 DIAGNOSIS — H52209 Unspecified astigmatism, unspecified eye: Secondary | ICD-10-CM | POA: Diagnosis not present

## 2012-07-02 DIAGNOSIS — Z961 Presence of intraocular lens: Secondary | ICD-10-CM | POA: Diagnosis not present

## 2012-07-02 DIAGNOSIS — H04129 Dry eye syndrome of unspecified lacrimal gland: Secondary | ICD-10-CM | POA: Diagnosis not present

## 2012-07-03 ENCOUNTER — Encounter: Payer: Self-pay | Admitting: *Deleted

## 2012-07-05 NOTE — Progress Notes (Signed)
Pt has severe heart failure-ef 25% Chronic sob Has an ICD-called dr Anne Fu office and faxed request form to be filled out for icd Also-need cardiac clearance to be done out pt surgery. Pt aware Can do [employer]

## 2012-07-09 DIAGNOSIS — J309 Allergic rhinitis, unspecified: Secondary | ICD-10-CM | POA: Diagnosis not present

## 2012-07-09 NOTE — Progress Notes (Signed)
Dr Anne Fu wrote clearance for surgery-ok to be done out pt, Dr allred filled out ICD-Pacer form-dr crews did want rep here to reprogram-rep called-bryan small

## 2012-07-10 ENCOUNTER — Encounter (HOSPITAL_BASED_OUTPATIENT_CLINIC_OR_DEPARTMENT_OTHER): Payer: Self-pay | Admitting: Certified Registered Nurse Anesthetist

## 2012-07-10 ENCOUNTER — Encounter (HOSPITAL_BASED_OUTPATIENT_CLINIC_OR_DEPARTMENT_OTHER)
Admission: RE | Disposition: A | Discharge status: Home / Self Care | Payer: Self-pay | Source: Ambulatory Visit | Attending: Orthopedic Surgery

## 2012-07-10 ENCOUNTER — Ambulatory Visit (HOSPITAL_BASED_OUTPATIENT_CLINIC_OR_DEPARTMENT_OTHER): Payer: Medicare Other | Admitting: Certified Registered Nurse Anesthetist

## 2012-07-10 ENCOUNTER — Encounter (HOSPITAL_BASED_OUTPATIENT_CLINIC_OR_DEPARTMENT_OTHER): Payer: Self-pay | Admitting: Orthopedic Surgery

## 2012-07-10 ENCOUNTER — Encounter (HOSPITAL_BASED_OUTPATIENT_CLINIC_OR_DEPARTMENT_OTHER): Payer: Self-pay | Admitting: *Deleted

## 2012-07-10 ENCOUNTER — Ambulatory Visit (HOSPITAL_BASED_OUTPATIENT_CLINIC_OR_DEPARTMENT_OTHER)
Admission: RE | Admit: 2012-07-10 | Discharge: 2012-07-10 | Disposition: A | Discharge status: Home / Self Care | Payer: Medicare Other | Source: Ambulatory Visit | Attending: Orthopedic Surgery | Admitting: Orthopedic Surgery

## 2012-07-10 DIAGNOSIS — I10 Essential (primary) hypertension: Secondary | ICD-10-CM | POA: Diagnosis not present

## 2012-07-10 DIAGNOSIS — M659 Synovitis and tenosynovitis, unspecified: Secondary | ICD-10-CM | POA: Diagnosis not present

## 2012-07-10 DIAGNOSIS — M19049 Primary osteoarthritis, unspecified hand: Secondary | ICD-10-CM | POA: Diagnosis not present

## 2012-07-10 DIAGNOSIS — M25549 Pain in joints of unspecified hand: Secondary | ICD-10-CM | POA: Diagnosis not present

## 2012-07-10 DIAGNOSIS — M24443 Recurrent dislocation, unspecified hand: Secondary | ICD-10-CM | POA: Insufficient documentation

## 2012-07-10 DIAGNOSIS — G8918 Other acute postprocedural pain: Secondary | ICD-10-CM | POA: Diagnosis not present

## 2012-07-10 HISTORY — PX: REPAIR EXTENSOR TENDON: SHX5382

## 2012-07-10 HISTORY — PX: FINGER ARTHROPLASTY: SHX5017

## 2012-07-10 LAB — POCT I-STAT, CHEM 8
BUN: 26 mg/dL — ABNORMAL HIGH (ref 6–23)
Chloride: 101 mEq/L (ref 96–112)
Sodium: 138 mEq/L (ref 135–145)

## 2012-07-10 SURGERY — REPAIR, TENDON, EXTENSOR
Anesthesia: General | Site: Hand | Laterality: Right | Wound class: Clean

## 2012-07-10 MED ORDER — PROPOFOL 10 MG/ML IV BOLUS
INTRAVENOUS | Status: DC | PRN
  Administered 2012-07-10: 50 mg via INTRAVENOUS

## 2012-07-10 MED ORDER — FENTANYL CITRATE 0.05 MG/ML IJ SOLN
50.0000 ug | Freq: Once | INTRAMUSCULAR | Status: AC
  Administered 2012-07-10: 50 ug via INTRAVENOUS

## 2012-07-10 MED ORDER — DEXAMETHASONE SODIUM PHOSPHATE 10 MG/ML IJ SOLN
INTRAMUSCULAR | Status: DC | PRN
  Administered 2012-07-10: 5 mg via INTRAVENOUS

## 2012-07-10 MED ORDER — HYDROMORPHONE HCL PF 1 MG/ML IJ SOLN
0.2500 mg | INTRAMUSCULAR | Status: DC | PRN
  Administered 2012-07-10 (×2): 0.25 mg via INTRAVENOUS

## 2012-07-10 MED ORDER — ONDANSETRON HCL 4 MG/2ML IJ SOLN
INTRAMUSCULAR | Status: DC | PRN
  Administered 2012-07-10: 4 mg via INTRAVENOUS

## 2012-07-10 MED ORDER — VANCOMYCIN HCL IN DEXTROSE 1-5 GM/200ML-% IV SOLN
1000.0000 mg | INTRAVENOUS | Status: AC
  Administered 2012-07-10: 1000 mg via INTRAVENOUS

## 2012-07-10 MED ORDER — OXYCODONE HCL 5 MG PO TABS: 5.0000 mg | ORAL_TABLET | Freq: Once | ORAL | Status: DC | PRN

## 2012-07-10 MED ORDER — LIDOCAINE HCL (CARDIAC) 20 MG/ML IV SOLN
INTRAVENOUS | Status: DC | PRN
  Administered 2012-07-10: 30 mg via INTRAVENOUS

## 2012-07-10 MED ORDER — PENTAZOCINE-NALOXONE 50-0.5 MG PO TABS: 1.0000 | ORAL_TABLET | ORAL | Status: DC | PRN

## 2012-07-10 MED ORDER — OXYCODONE HCL 5 MG/5ML PO SOLN: 5.0000 mg | Freq: Once | ORAL | Status: DC | PRN

## 2012-07-10 MED ORDER — CHLORHEXIDINE GLUCONATE 4 % EX LIQD: 60.0000 mL | Freq: Once | CUTANEOUS | Status: DC

## 2012-07-10 MED ORDER — ONDANSETRON HCL 4 MG/2ML IJ SOLN: 4.0000 mg | Freq: Once | INTRAMUSCULAR | Status: DC | PRN

## 2012-07-10 MED ORDER — 0.9 % SODIUM CHLORIDE (POUR BTL) OPTIME
TOPICAL | Status: DC | PRN
  Administered 2012-07-10: 1000 mL

## 2012-07-10 MED ORDER — LACTATED RINGERS IV SOLN
INTRAVENOUS | Status: DC
  Administered 2012-07-10: 08:00:00 via INTRAVENOUS

## 2012-07-10 SURGICAL SUPPLY — 80 items
BAG DECANTER FOR FLEXI CONT (MISCELLANEOUS) IMPLANT
BANDAGE GAUZE ELAST BULKY 4 IN (GAUZE/BANDAGES/DRESSINGS) ×2 IMPLANT
BLADE MINI RND TIP GREEN BEAV (BLADE) ×2 IMPLANT
BLADE OSC/SAG .038X5.5 CUT EDG (BLADE) ×2 IMPLANT
BLADE SURG 15 STRL LF DISP TIS (BLADE) ×1 IMPLANT
BLADE SURG 15 STRL SS (BLADE) ×1
BNDG COHESIVE 3X5 TAN STRL LF (GAUZE/BANDAGES/DRESSINGS) ×2 IMPLANT
BNDG ESMARK 4X9 LF (GAUZE/BANDAGES/DRESSINGS) ×2 IMPLANT
BUR FAST CUTTING MED (BURR) ×2 IMPLANT
CHLORAPREP W/TINT 26ML (MISCELLANEOUS) ×2 IMPLANT
CLOTH BEACON ORANGE TIMEOUT ST (SAFETY) ×2 IMPLANT
CORDS BIPOLAR (ELECTRODE) ×2 IMPLANT
COTTONBALL LRG STERILE PKG (GAUZE/BANDAGES/DRESSINGS) IMPLANT
COVER MAYO STAND STRL (DRAPES) ×2 IMPLANT
COVER TABLE BACK 60X90 (DRAPES) ×2 IMPLANT
CUFF TOURNIQUET SINGLE 18IN (TOURNIQUET CUFF) ×2 IMPLANT
DECANTER SPIKE VIAL GLASS SM (MISCELLANEOUS) IMPLANT
DRAIN TLS ROUND 10FR (DRAIN) IMPLANT
DRAPE EXTREMITY T 121X128X90 (DRAPE) ×2 IMPLANT
DRAPE OEC MINIVIEW 54X84 (DRAPES) ×2 IMPLANT
DRAPE SURG 17X23 STRL (DRAPES) ×2 IMPLANT
DRSG KUZMA FLUFF (GAUZE/BANDAGES/DRESSINGS) IMPLANT
GAUZE SPONGE 4X4 16PLY XRAY LF (GAUZE/BANDAGES/DRESSINGS) IMPLANT
GAUZE XEROFORM 1X8 LF (GAUZE/BANDAGES/DRESSINGS) ×2 IMPLANT
GLOVE BIO SURGEON STRL SZ 6.5 (GLOVE) ×2 IMPLANT
GLOVE BIOGEL PI IND STRL 7.0 (GLOVE) ×1 IMPLANT
GLOVE BIOGEL PI IND STRL 8.5 (GLOVE) ×2 IMPLANT
GLOVE BIOGEL PI INDICATOR 7.0 (GLOVE) ×1
GLOVE BIOGEL PI INDICATOR 8.5 (GLOVE) ×2
GLOVE SURG ORTHO 8.0 STRL STRW (GLOVE) ×2 IMPLANT
GOWN BRE IMP PREV XXLGXLNG (GOWN DISPOSABLE) ×2 IMPLANT
GOWN PREVENTION PLUS XLARGE (GOWN DISPOSABLE) ×2 IMPLANT
IMPLANT FINGER JOINT SZ3 (Finger Joint) ×2 IMPLANT
IMPLANT JOINT FINDER SZ 4 (Finger Joint) ×4 IMPLANT
KWIRE 4.0 X .035IN (WIRE) ×2 IMPLANT
LOOP VESSEL MAXI BLUE (MISCELLANEOUS) ×2 IMPLANT
NEEDLE 27GAX1X1/2 (NEEDLE) IMPLANT
NEEDLE HYPO 22GX1.5 SAFETY (NEEDLE) IMPLANT
NEEDLE KEITH (NEEDLE) IMPLANT
NS IRRIG 1000ML POUR BTL (IV SOLUTION) ×4 IMPLANT
PACK BASIN DAY SURGERY FS (CUSTOM PROCEDURE TRAY) ×2 IMPLANT
PAD CAST 3X4 CTTN HI CHSV (CAST SUPPLIES) ×1 IMPLANT
PADDING CAST ABS 3INX4YD NS (CAST SUPPLIES)
PADDING CAST ABS 4INX4YD NS (CAST SUPPLIES)
PADDING CAST ABS COTTON 3X4 (CAST SUPPLIES) IMPLANT
PADDING CAST ABS COTTON 4X4 ST (CAST SUPPLIES) IMPLANT
PADDING CAST COTTON 3X4 STRL (CAST SUPPLIES) ×1
SLEEVE SCD COMPRESS KNEE MED (MISCELLANEOUS) ×2 IMPLANT
SPLINT PLASTER CAST XFAST 3X15 (CAST SUPPLIES) ×10 IMPLANT
SPLINT PLASTER XTRA FASTSET 3X (CAST SUPPLIES) ×10
SPONGE GAUZE 4X4 12PLY (GAUZE/BANDAGES/DRESSINGS) ×2 IMPLANT
STOCKINETTE 4X48 STRL (DRAPES) ×2 IMPLANT
SUT CHROMIC 5 0 P 3 (SUTURE) IMPLANT
SUT ETHIBOND 3-0 V-5 (SUTURE) IMPLANT
SUT FIBERWIRE 2-0 18 17.9 3/8 (SUTURE)
SUT FIBERWIRE 4-0 18 TAPR NDL (SUTURE)
SUT MERSILENE 2.0 SH NDLE (SUTURE) IMPLANT
SUT MERSILENE 3 0 FS 1 (SUTURE) ×2 IMPLANT
SUT MERSILENE 4 0 P 3 (SUTURE) ×2 IMPLANT
SUT POLY BUTTON 15MM (SUTURE) IMPLANT
SUT PROLENE 2 0 SH DA (SUTURE) IMPLANT
SUT SILK 2 0 FS (SUTURE) IMPLANT
SUT SILK 4 0 PS 2 (SUTURE) IMPLANT
SUT STEEL 3 0 (SUTURE) IMPLANT
SUT STEEL 4 0 V 26 (SUTURE) IMPLANT
SUT VIC AB 3-0 PS1 18 (SUTURE)
SUT VIC AB 3-0 PS1 18XBRD (SUTURE) IMPLANT
SUT VIC AB 4-0 P-3 18XBRD (SUTURE) IMPLANT
SUT VIC AB 4-0 P3 18 (SUTURE)
SUT VICRYL 4-0 PS2 18IN ABS (SUTURE) ×2 IMPLANT
SUT VICRYL RAPID 5 0 P 3 (SUTURE) IMPLANT
SUT VICRYL RAPIDE 4/0 PS 2 (SUTURE) ×2 IMPLANT
SUTURE FIBERWR 2-0 18 17.9 3/8 (SUTURE) IMPLANT
SUTURE FIBERWR 4-0 18 TAPR NDL (SUTURE) IMPLANT
SYR BULB 3OZ (MISCELLANEOUS) ×2 IMPLANT
SYR CONTROL 10ML LL (SYRINGE) IMPLANT
TOWEL OR 17X24 6PK STRL BLUE (TOWEL DISPOSABLE) ×2 IMPLANT
TUBE FEEDING 5FR 15 INCH (TUBING) IMPLANT
UNDERPAD 30X30 INCONTINENT (UNDERPADS AND DIAPERS) ×2 IMPLANT
WATER STERILE IRR 1000ML POUR (IV SOLUTION) IMPLANT

## 2012-07-10 NOTE — Transfer of Care (Signed)
Immediate Anesthesia Transfer of Care Note  Patient: [name]  Procedure(s) Performed: Procedure(s) (LRB) with comments: REPAIR EXTENSOR TENDON (Right) - METACARPAL PHALANGEAL REPLACEMENT ARTHROPLASTIES RIGHT INDEX, MIDDLE, AND RING FINGERS  CENTRALIZATION EXTENSOR TENDONS INDEX, MIDDLE,  AND RING FINGER  FINGER ARTHROPLASTY (Right) - METACARPAL PHALANGEAL ARTHROPLASTIES RIGHT INDEX, MIDDLE, AND RING FINGERS *TWO BLUE LOOPS LEFT IN AS DRAINS*  Patient Location: PACU  Anesthesia Type: General and Regional  Level of Consciousness: awake, alert , oriented and patient cooperative  Airway & Oxygen Therapy: Patient Spontanous Breathing and Patient connected to face mask oxygen  Post-op Assessment: Report given to PACU RN and Post -op Vital signs reviewed and stable  Post vital signs: Reviewed and stable  Complications: No apparent anesthesia complications

## 2012-07-10 NOTE — Progress Notes (Signed)
Assisted Dr. Crews with right, ultrasound guided, supraclavicular block. Side rails up, monitors on throughout procedure. See vital signs in flow sheet. Tolerated Procedure well. 

## 2012-07-10 NOTE — Anesthesia Procedure Notes (Signed)
Procedure Name: LMA Insertion Date/Time: 07/10/2012 9:13 AM Performed by: Suraya Vidrine D Pre-anesthesia Checklist: Patient identified, Emergency Drugs available, Suction available and Patient being monitored Patient Re-evaluated:Patient Re-evaluated prior to inductionOxygen Delivery Method: Circle System Utilized Preoxygenation: Pre-oxygenation with 100% oxygen Intubation Type: IV induction Ventilation: Mask ventilation without difficulty LMA: LMA inserted LMA Size: 4.0 Number of attempts: 1 Airway Equipment and Method: bite block Placement Confirmation: positive ETCO2 Tube secured with: Tape Dental Injury: Teeth and Oropharynx as per pre-operative assessment

## 2012-07-10 NOTE — Anesthesia Postprocedure Evaluation (Signed)
  Anesthesia Post-op Note  Patient: [name]  Procedure(s) Performed: Procedure(s) (LRB) with comments: REPAIR EXTENSOR TENDON (Right) - METACARPAL PHALANGEAL REPLACEMENT ARTHROPLASTIES RIGHT INDEX, MIDDLE, AND RING FINGERS  CENTRALIZATION EXTENSOR TENDONS INDEX, MIDDLE,  AND RING FINGER  FINGER ARTHROPLASTY (Right) - METACARPAL PHALANGEAL ARTHROPLASTIES RIGHT INDEX, MIDDLE, AND RING FINGERS *TWO BLUE LOOPS LEFT IN AS DRAINS*  Patient Location: PACU  Anesthesia Type: GA combined with regional for post-op pain  Level of Consciousness: awake, alert  and oriented  Airway and Oxygen Therapy: Patient Spontanous Breathing and Patient connected to face mask oxygen  Post-op Pain: mild  Post-op Assessment: Post-op Vital signs reviewed  Post-op Vital Signs: Reviewed  Complications: No apparent anesthesia complications

## 2012-07-10 NOTE — Anesthesia Preprocedure Evaluation (Signed)
Anesthesia Evaluation    Airway Mallampati: II TM Distance: >3 FB Neck ROM: Full    Dental  (+) Lower Dentures and Upper Dentures   Pulmonary  breath sounds clear to auscultation        Cardiovascular + CAD (Sob  on stairs, EF 25-30%) + Cardiac Defibrillator Rhythm:Regular Rate:Normal     Neuro/Psych    GI/Hepatic   Endo/Other    Renal/GU      Musculoskeletal   Abdominal   Peds  Hematology   Anesthesia Other Findings   Reproductive/Obstetrics                           Anesthesia Physical Anesthesia Plan  ASA: IV  Anesthesia Plan: General   Post-op Pain Management:    Induction: Intravenous  Airway Management Planned: LMA  Additional Equipment:   Intra-op Plan:   Post-operative Plan: Extubation in OR  Informed Consent: I have reviewed the patients History and Physical, chart, labs and discussed the procedure including the risks, benefits and alternatives for the proposed anesthesia with the patient or authorized representative who has indicated his/her understanding and acceptance.   Dental advisory given  Plan Discussed with: CRNA, Anesthesiologist and Surgeon  Anesthesia Plan Comments:         Anesthesia Quick Evaluation

## 2012-07-10 NOTE — Brief Op Note (Signed)
07/10/2012  11:25 AM  PATIENT:  [name]  78 y.o. female  PRE-OPERATIVE DIAGNOSIS:  arthritis right hand   POST-OPERATIVE DIAGNOSIS:  arthritis right hand   PROCEDURE:  Procedure(s) (LRB) with comments: REPAIR EXTENSOR TENDON (Right) - METACARPAL PHALANGEAL REPLACEMENT ARTHROPLASTIES RIGHT INDEX, MIDDLE, AND RING FINGERS  CENTRALIZATION EXTENSOR TENDONS INDEX, MIDDLE,  AND RING FINGER  FINGER ARTHROPLASTY (Right) - METACARPAL PHALANGEAL ARTHROPLASTIES RIGHT INDEX, MIDDLE, AND RING FINGERS *TWO BLUE LOOPS LEFT IN AS DRAINS*  SURGEON:  Surgeon(s) and Role:    * Nicki Reaper, MD - Primary  PHYSICIAN ASSISTANT:   ASSISTANTS: none   ANESTHESIA:   regional and general  EBL:  Total I/O In: 700 [I.V.:700] Out: -   BLOOD ADMINISTERED:none  DRAINS: Penrose drain in the rt hand   LOCAL MEDICATIONS USED:  NONE  SPECIMEN:  Excision  DISPOSITION OF SPECIMEN:  PATHOLOGY  COUNTS:  YES  TOURNIQUET:   Total Tourniquet Time Documented: Upper Arm (Right) - 110 minutes  DICTATION: .Other Dictation: Dictation Number [id number]  PLAN OF CARE: Discharge to home after PACU  PATIENT DISPOSITION:  PACU - hemodynamically stable.

## 2012-07-10 NOTE — H&P (Signed)
[name] is 78 yo female fwith subluxation metacarpophalangeal joints right index, middle and ring fingers.     She is complaining of pain at the MCP joint of her right index,middle and ring fingers.  She has no history of injury. She states this has been going on for approximately 3 years. She complains of problems with pinching and gripping. She has no new injury to it. She is complaining of continued catching of her index with crepitation with flexion/extension.   PAST MEDICAL HISTORY: She is allergic to sulfa, aspirin, PCN, E-Mycin, Amitriptyline, Bactrim, Ceftin, Fulvicin, Ketac sol, Klonopin, Hydrocodone, Lorcet, Nizoral, Percodan, Serevent, Tapazole, Trovan, Ventolin, Xanax, Keflex, and nasal sprays. She has allergies to dust and mold. She is currently taking Synthroid, lasix, Fluconazole,   Nortriptyline, Omeprazole, Crestor, Singulair, Fosamax, Clonidine and multiple supplements. Past surgery includes cervical spine fusions, laminectomies, a 2nd cervical spine fusion, 4 sinus surgeries, appendectomy, T&A, breast lumpectomy, hysterectomy, dowel implant, epidural  steroid injections, RK of her right eye, cataracts, RK of her left eye, cataract removal, facet blocks, hemorrhoidectomy, right rotator cuff repair, left rotator cuff repair, renal artery dilatation, sympathectomy, carpal tunnel release, repair of an umbilical hernia and partial removal of right upper lobe of her lung.   FAMILY H ISTORY: Negative.  SOCIAL HISTORY: She does not smoke or drink. She is married and retired.  REVIEW OF SYSTEMS: Positive for contacts, high BP, asthma, cough, pneumonia, rash, sleep disorder, easy bruising, otherwise negative.  [name] is an 78 y.o. female.   Chief Complaint: Subluxation MCP Rt index, middle and ring fingers HPI: see above  Past Medical History  Diagnosis Date  . Ischemic cardiomyopathy     severe. Left ventricular ejection fraction 20%.   . Chronic systolic heart failure     NYHA  class II.  [name] Chronic pulmonary disease   . BBB (bundle branch block)     s/p BiV ICD implant  . Raynaud's syndrome   . Neuromuscular scoliosis of thoracolumbar region     type of scoliosis was not specified.   [name] DJD (degenerative joint disease), cervical   . DJD (degenerative joint disease), lumbar   . HTN (hypertension)   . Hyperthyroidism     following Graves disease  . Renal artery stenosis     Treated with angioplast in 1980 and 1987.   . S/P CABG (coronary artery bypass graft) April 2012  . FH: mitral valve repair     with 26 mm Edwards ring angioplasty,     Past Surgical History  Procedure Date  . Total abdominal hysterectomy   . Sympathectomy   . Tonsillectomy   . Renal artery ballon dilation   . Rotator cuff repair     right and left  . Laminotomy/foraminotomy     with decompression of the L4 nerve root   . Cervical fusion     C5-6 and C6-7, C4-5 with titanium plates  . Umbilical hernia repair   . Wedge resection 2001    for the right upper lobe for Aspergillus treatement.  Dr. Edwyna Shell apprix 2001.  [name] Ptca     of bilateral renal arteries  . Cataract extraction   . Carpal tunnel release   . Appendectomy   . Breast lumpectomy     left breast  . Renal artery ballon dilation     x2  . Precancerous growth     tops of ear removed. bilateral.   . Coronary artery bypass graft 2010  . Implantation of  icd 2010    BiV ICD implant ([facility]) by Dr Amil Amen 03/2009    Family History  Problem Relation Age of Onset  . Heart disease Father   . Hypertension Father   . Colon cancer      grandmother   Social History:  reports that she has never smoked. She does not have any smokeless tobacco history on file. She reports that she does not drink alcohol. Her drug history not on file.  Allergies:  Allergies  Allergen Reactions  . Alprazolam     REACTION: ulcer's in mouth and extreme constipation  . Aspirin     REACTION: upsets stomach  . Calcitonin ([place])     REACTION:  rash over entire body  . Cefuroxime Axetil     REACTION: either rash and diarrhea  . Cephalexin     REACTION: rash  . Ciprofloxacin     REACTION: rash  . Clonazepam     REACTION: 1/2 pill makes grogginess next day  . Doxycycline     REACTION: severe rash over entire body  . Erythromycin     REACTION: rash  . Hydrocodone     REACTION: nausea and totally out of it  . Hydrocodone-Acetaminophen     REACTION: reaction forgeotten  . Ketoconazole     REACTION: terribly weak, voice shook  . Oxycodone-Aspirin     REACTION: nausea  . Penicillins     REACTION: rash  . Sulfamethoxazole W-Trimethoprim     REACTION: either rash or diarrhea  . Sulfonamide Derivatives     REACTION: reaction forgotten    Medications Prior to Admission  Medication Sig Dispense Refill  . Calcium-Vitamin D-Vitamin K (VIACTIV FLAVOR GLIDES) 500-200-40 MG-UNT-MCG TABS Take by mouth 2 (two) times daily.        . carvedilol (COREG) 12.5 MG tablet Take 12.5 mg by mouth 2 (two) times daily.        . Cholecalciferol (VITAMIN D3) 2000 UNITS capsule Take 2,000 Units by mouth daily.        [name] co-enzyme Q-10 50 MG capsule Take 50 mg by mouth daily.      . fluconazole (DIFLUCAN) 100 MG tablet Take 100 mg by mouth once a week.        . levothyroxine (SYNTHROID, LEVOTHROID) 100 MCG tablet Take 100 mcg by mouth daily.        . montelukast (SINGULAIR) 10 MG tablet Take 10 mg by mouth daily.        . nortriptyline (PAMELOR) 25 MG capsule Take 30 mg by mouth daily.       . pantoprazole (PROTONIX) 40 MG tablet Take 40 mg by mouth daily.       . simvastatin (ZOCOR) 40 MG tablet Take 40 mg by mouth daily.        . misoprostol (CYTOTEC) 200 MCG tablet       . triamcinolone cream (KENALOG) 0.1 % As directed        Results for orders placed during the hospital encounter of 07/10/12 (from the past 48 hour(s))  POCT I-STAT, CHEM 8     Status: Abnormal   Collection Time   07/10/12  7:47 AM      Component Value Range Comment    Sodium 138  135 - 145 mEq/L    Potassium 4.1  3.5 - 5.1 mEq/L    Chloride 101  96 - 112 mEq/L    BUN 26 (*) 6 - 23 mg/dL    Creatinine, Ser 5.78  0.50 - 1.10 mg/dL    Glucose, Bld 88  70 - 99 mg/dL    Calcium, Ion 9.60  4.54 - 1.30 mmol/L    TCO2 29  0 - 100 mmol/L    Hemoglobin 14.3  12.0 - 15.0 g/dL    HCT 09.8  11.9 - 14.7 %     No results found.   Pertinent items are noted in HPI.  Blood pressure 148/65, pulse 61, temperature 97.5 F (36.4 C), temperature source Oral, resp. rate 16, height 5' (1.524 m), weight 50.576 kg (111 lb 8 oz), SpO2 100.00%.  General appearance: alert, cooperative and appears stated age Head: Normocephalic, without obvious abnormality Neck: no adenopathy Resp: clear to auscultation bilaterally Cardio: regular rate and rhythm, S1, S2 normal, no murmur, click, rub or gallop GI: soft, non-tender; bowel sounds normal; no masses,  no organomegaly Extremities: extremities normal, atraumatic, no cyanosis or edema Pulses: 2+ and symmetric Skin: Skin color, texture, turgor normal. No rashes or lesions Neurologic: Grossly normal Incision/Wound: na  Assessment/Plan She is aware there is no guarantee with the surgery, possibility of infection, recurrence, injury to arteries, nerves, tendons, incomplete relief of symptoms and dystrophy, incomplete mobility.   This will be scheduled  as an outpatient for replacement arthroplasties index, middle, ring fingers with tendon transfers at her wrist.  Brina Umeda R 07/10/2012, 8:16 AM

## 2012-07-10 NOTE — Op Note (Signed)
Dictated number;[id number]

## 2012-07-11 NOTE — Op Note (Signed)
NAME:  [name], [name]                 ACCOUNT NO.:  [id number]  MEDICAL RECORD NO.:  [id number]  LOCATION:                                 FACILITY:  PHYSICIAN:  Cindee Salt, M.D.            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  OPERATION:  Metacarpophalangeal joint replacements, silastic right interposition arthroplasties with repair of collateral ligaments and centralization of extensor tendons, right index, middle and ring finger.  SURGEON:  Cindee Salt, MD  ANESTHESIA:  Supraclavicular block general.  ANESTHESIOLOGIST:  Sheldon Silvan, MD  HISTORY:  The patient is a 78 year old female with a history of ulnar drift, volar subluxation and dislocation to the metacarpophalangeal joints, index, middle and ring fingers.  She is complaining of significant pain, is desirous of proceeding with surgical intervention. She is scheduled for metacarpophalangeal joint arthroplasties.  She is aware of risks and complications including infection; recurrence of injury to arteries, nerves, tendons; incomplete relief of symptoms; and dystrophy.  In the preoperative area, the patient is seen, the extremity marked by both the patient and surgeon, and antibiotic given.  PROCEDURE:  The patient was brought to the operating room where a supraclavicular block and general anesthetic were each given without difficulty.  She was prepped using ChloraPrep, supine position with the right arm free.  A 3-minute dry time was allowed.  Time-out taken, confirming the patient and procedure.  The limb was exsanguinated with an Esmarch bandage.  Tourniquet was placed on  the upper arm was inflated to 275 mmHg.  A transverse incision was made over the metacarpophalangeal joints, index, middle, ring and little fingers of her right hand, carried down through the subcutaneous tissue.  Bleeders were electrocauterized with bipolar.  The dissection was carried down, isolating the extensor tendons, these were incised on the ulnar aspect allowing visualization of the joints.  Protection was given to the intermetacarpal space to protect both the neurovascular bundles.  The joints were then opened with longitudinal incision on the dorsal aspect. The collateral ligaments were incised both radially and ulnarly from the metacarpal heads, these were preserved.  This was done to the index, middle, and ring finger sequentially.  The metacarpal head was then removed with an oscillating saw.  This allowed visualization of the proximal aspect of the proximal phalanx, articular surface on the middle and ring fingers, these showed significant erosions on the dorsal aspect.  These were each isolated.  The drill holes were placed in the central aspect of each one of the proximal phalanges.  These were then enlarged for the placement of silastic prosthetic replacements.  These were each sized to a #2 size using reamers and burrs as necessary. These were copiously irrigated with saline, these measured out to 4 and 3 size on the index, middle and ring fingers.  Trials were placed, these each fit well.  The collateral ligaments were then tagged with 2-0 Mersilene sutures.  Drill hole was placed on the metacarpal heads for reattachment to the radial ligament only.  With no-touch technique after copiously irrigating each of the joint surfaces and medullary canals, a #4 prosthesis was placed in the index, a #4 in the middle and #3 in the ring finger metacarpophalangeal joint.  Care was taken to place the prosthetic limbs distally to be certain that they were in the proximal phalanx of  each of the fingers.  Each was brought up in full extension, showed good positioning in both AP and lateral direction with full flexion and extension being capable.  The wounds were again irrigated. The collateral ligaments were then repaired through the drill holes, which were made with a 3.5 K-wire stabilizing and radially deviating each of the digits at the metacarpophalangeal joint.  The capsules were then closed with figure-of-eight 4-0 Vicryl sutures.  The extensor tendons were reefed on the radial aspect with 4-0 Mersilene sutures in either horizontal or figure-of-eight manner centralizing the tendons over the center of the metacarpal phalangeal joint replacements.  X-rays confirmed positioning of the prosthesis on each of the digits.  A doubled over vessel loop drain was placed to the depths of the wound and the skin closed with interrupted 4-0 Vicryl Rapide sutures.  A sterile compressive dorsal palmar splint applied, forearm based out to the finger tips.  On deflation of the tourniquet, all fingers were immediately pinked.  She was taken to the recovery room for observation in satisfactory condition.  She will be discharged to home to return in 1 week.          ______________________________ Cindee Salt, M.D.     GK/MEDQ  D:  07/10/2012  T:  07/11/2012  Job:  [id number]

## 2012-07-15 ENCOUNTER — Encounter (HOSPITAL_BASED_OUTPATIENT_CLINIC_OR_DEPARTMENT_OTHER): Payer: Self-pay | Admitting: Orthopedic Surgery

## 2012-07-18 DIAGNOSIS — M19049 Primary osteoarthritis, unspecified hand: Secondary | ICD-10-CM | POA: Diagnosis not present

## 2012-07-18 DIAGNOSIS — S63269A Dislocation of metacarpophalangeal joint of unspecified finger, initial encounter: Secondary | ICD-10-CM | POA: Diagnosis not present

## 2012-07-30 DIAGNOSIS — J309 Allergic rhinitis, unspecified: Secondary | ICD-10-CM | POA: Diagnosis not present

## 2012-08-05 DIAGNOSIS — S63269A Dislocation of metacarpophalangeal joint of unspecified finger, initial encounter: Secondary | ICD-10-CM | POA: Diagnosis not present

## 2012-08-05 DIAGNOSIS — M19049 Primary osteoarthritis, unspecified hand: Secondary | ICD-10-CM | POA: Diagnosis not present

## 2012-08-05 DIAGNOSIS — M083 Juvenile rheumatoid polyarthritis (seronegative): Secondary | ICD-10-CM | POA: Diagnosis not present

## 2012-08-07 DIAGNOSIS — M083 Juvenile rheumatoid polyarthritis (seronegative): Secondary | ICD-10-CM | POA: Diagnosis not present

## 2012-08-07 DIAGNOSIS — M19049 Primary osteoarthritis, unspecified hand: Secondary | ICD-10-CM | POA: Diagnosis not present

## 2012-08-07 DIAGNOSIS — S63269A Dislocation of metacarpophalangeal joint of unspecified finger, initial encounter: Secondary | ICD-10-CM | POA: Diagnosis not present

## 2012-08-12 DIAGNOSIS — S63269A Dislocation of metacarpophalangeal joint of unspecified finger, initial encounter: Secondary | ICD-10-CM | POA: Diagnosis not present

## 2012-08-12 DIAGNOSIS — M19049 Primary osteoarthritis, unspecified hand: Secondary | ICD-10-CM | POA: Diagnosis not present

## 2012-08-12 DIAGNOSIS — M083 Juvenile rheumatoid polyarthritis (seronegative): Secondary | ICD-10-CM | POA: Diagnosis not present

## 2012-08-14 DIAGNOSIS — S63269A Dislocation of metacarpophalangeal joint of unspecified finger, initial encounter: Secondary | ICD-10-CM | POA: Diagnosis not present

## 2012-08-14 DIAGNOSIS — M19049 Primary osteoarthritis, unspecified hand: Secondary | ICD-10-CM | POA: Diagnosis not present

## 2012-08-14 DIAGNOSIS — M083 Juvenile rheumatoid polyarthritis (seronegative): Secondary | ICD-10-CM | POA: Diagnosis not present

## 2012-08-26 DIAGNOSIS — S63269A Dislocation of metacarpophalangeal joint of unspecified finger, initial encounter: Secondary | ICD-10-CM | POA: Diagnosis not present

## 2012-08-26 DIAGNOSIS — M19049 Primary osteoarthritis, unspecified hand: Secondary | ICD-10-CM | POA: Diagnosis not present

## 2012-08-26 DIAGNOSIS — M083 Juvenile rheumatoid polyarthritis (seronegative): Secondary | ICD-10-CM | POA: Diagnosis not present

## 2012-08-27 DIAGNOSIS — M159 Polyosteoarthritis, unspecified: Secondary | ICD-10-CM | POA: Diagnosis not present

## 2012-08-27 DIAGNOSIS — M7989 Other specified soft tissue disorders: Secondary | ICD-10-CM | POA: Diagnosis not present

## 2012-08-27 DIAGNOSIS — J309 Allergic rhinitis, unspecified: Secondary | ICD-10-CM | POA: Diagnosis not present

## 2012-08-29 DIAGNOSIS — S63269A Dislocation of metacarpophalangeal joint of unspecified finger, initial encounter: Secondary | ICD-10-CM | POA: Diagnosis not present

## 2012-08-29 DIAGNOSIS — M083 Juvenile rheumatoid polyarthritis (seronegative): Secondary | ICD-10-CM | POA: Diagnosis not present

## 2012-08-29 DIAGNOSIS — M19049 Primary osteoarthritis, unspecified hand: Secondary | ICD-10-CM | POA: Diagnosis not present

## 2012-09-02 DIAGNOSIS — M19049 Primary osteoarthritis, unspecified hand: Secondary | ICD-10-CM | POA: Diagnosis not present

## 2012-09-02 DIAGNOSIS — S63269A Dislocation of metacarpophalangeal joint of unspecified finger, initial encounter: Secondary | ICD-10-CM | POA: Diagnosis not present

## 2012-09-04 DIAGNOSIS — I447 Left bundle-branch block, unspecified: Secondary | ICD-10-CM | POA: Diagnosis not present

## 2012-09-04 DIAGNOSIS — I2510 Atherosclerotic heart disease of native coronary artery without angina pectoris: Secondary | ICD-10-CM | POA: Diagnosis not present

## 2012-09-04 DIAGNOSIS — Z95810 Presence of automatic (implantable) cardiac defibrillator: Secondary | ICD-10-CM | POA: Diagnosis not present

## 2012-09-04 DIAGNOSIS — I5022 Chronic systolic (congestive) heart failure: Secondary | ICD-10-CM | POA: Diagnosis not present

## 2012-09-06 DIAGNOSIS — S63269A Dislocation of metacarpophalangeal joint of unspecified finger, initial encounter: Secondary | ICD-10-CM | POA: Diagnosis not present

## 2012-09-06 DIAGNOSIS — M19049 Primary osteoarthritis, unspecified hand: Secondary | ICD-10-CM | POA: Diagnosis not present

## 2012-09-10 DIAGNOSIS — M19049 Primary osteoarthritis, unspecified hand: Secondary | ICD-10-CM | POA: Diagnosis not present

## 2012-09-10 DIAGNOSIS — S63269A Dislocation of metacarpophalangeal joint of unspecified finger, initial encounter: Secondary | ICD-10-CM | POA: Diagnosis not present

## 2012-09-16 DIAGNOSIS — S63269A Dislocation of metacarpophalangeal joint of unspecified finger, initial encounter: Secondary | ICD-10-CM | POA: Diagnosis not present

## 2012-09-16 DIAGNOSIS — M19049 Primary osteoarthritis, unspecified hand: Secondary | ICD-10-CM | POA: Diagnosis not present

## 2012-09-17 DIAGNOSIS — J309 Allergic rhinitis, unspecified: Secondary | ICD-10-CM | POA: Diagnosis not present

## 2012-09-30 ENCOUNTER — Ambulatory Visit (INDEPENDENT_AMBULATORY_CARE_PROVIDER_SITE_OTHER): Payer: Medicare Other | Admitting: *Deleted

## 2012-09-30 ENCOUNTER — Encounter: Payer: Self-pay | Admitting: Internal Medicine

## 2012-09-30 DIAGNOSIS — S63269A Dislocation of metacarpophalangeal joint of unspecified finger, initial encounter: Secondary | ICD-10-CM | POA: Diagnosis not present

## 2012-09-30 DIAGNOSIS — I2589 Other forms of chronic ischemic heart disease: Secondary | ICD-10-CM | POA: Diagnosis not present

## 2012-09-30 DIAGNOSIS — Z95810 Presence of automatic (implantable) cardiac defibrillator: Secondary | ICD-10-CM

## 2012-09-30 DIAGNOSIS — M19049 Primary osteoarthritis, unspecified hand: Secondary | ICD-10-CM | POA: Diagnosis not present

## 2012-10-02 DIAGNOSIS — I2510 Atherosclerotic heart disease of native coronary artery without angina pectoris: Secondary | ICD-10-CM | POA: Diagnosis not present

## 2012-10-02 DIAGNOSIS — E782 Mixed hyperlipidemia: Secondary | ICD-10-CM | POA: Diagnosis not present

## 2012-10-02 DIAGNOSIS — R42 Dizziness and giddiness: Secondary | ICD-10-CM | POA: Diagnosis not present

## 2012-10-02 DIAGNOSIS — D509 Iron deficiency anemia, unspecified: Secondary | ICD-10-CM | POA: Diagnosis not present

## 2012-10-02 DIAGNOSIS — R0602 Shortness of breath: Secondary | ICD-10-CM | POA: Diagnosis not present

## 2012-10-02 DIAGNOSIS — I10 Essential (primary) hypertension: Secondary | ICD-10-CM | POA: Diagnosis not present

## 2012-10-02 DIAGNOSIS — Z0000 Encounter for general adult medical examination without abnormal findings: Secondary | ICD-10-CM | POA: Diagnosis not present

## 2012-10-02 LAB — REMOTE ICD DEVICE
ATRIAL PACING ICD: 57 pct
BAMS-0001: 150 {beats}/min
BATTERY VOLTAGE: 2.62 V
DEVICE MODEL ICD: 702836
HV IMPEDENCE: 50 Ohm
LV LEAD IMPEDENCE ICD: 660 Ohm
RV LEAD IMPEDENCE ICD: 390 Ohm
TZAT-0001SLOWVT: 1
TZAT-0018SLOWVT: NEGATIVE
TZAT-0019SLOWVT: 7.5 V
TZAT-0020SLOWVT: 1 ms
TZON-0003SLOWVT: 350 ms
TZON-0004SLOWVT: 30
TZON-0005SLOWVT: 6
TZON-0010SLOWVT: 80 ms
TZST-0001SLOWVT: 2
TZST-0001SLOWVT: 4
TZST-0003SLOWVT: 20 J
TZST-0003SLOWVT: 36 J
VENTRICULAR PACING ICD: 98 pct

## 2012-10-07 DIAGNOSIS — S63269A Dislocation of metacarpophalangeal joint of unspecified finger, initial encounter: Secondary | ICD-10-CM | POA: Diagnosis not present

## 2012-10-07 DIAGNOSIS — M19049 Primary osteoarthritis, unspecified hand: Secondary | ICD-10-CM | POA: Diagnosis not present

## 2012-10-08 DIAGNOSIS — J309 Allergic rhinitis, unspecified: Secondary | ICD-10-CM | POA: Diagnosis not present

## 2012-10-10 ENCOUNTER — Encounter: Payer: Self-pay | Admitting: *Deleted

## 2012-10-14 DIAGNOSIS — M19049 Primary osteoarthritis, unspecified hand: Secondary | ICD-10-CM | POA: Diagnosis not present

## 2012-10-14 DIAGNOSIS — M083 Juvenile rheumatoid polyarthritis (seronegative): Secondary | ICD-10-CM | POA: Diagnosis not present

## 2012-10-14 DIAGNOSIS — S63269A Dislocation of metacarpophalangeal joint of unspecified finger, initial encounter: Secondary | ICD-10-CM | POA: Diagnosis not present

## 2012-10-15 DIAGNOSIS — J309 Allergic rhinitis, unspecified: Secondary | ICD-10-CM | POA: Diagnosis not present

## 2012-10-22 DIAGNOSIS — J309 Allergic rhinitis, unspecified: Secondary | ICD-10-CM | POA: Diagnosis not present

## 2012-10-28 DIAGNOSIS — S63269A Dislocation of metacarpophalangeal joint of unspecified finger, initial encounter: Secondary | ICD-10-CM | POA: Diagnosis not present

## 2012-10-28 DIAGNOSIS — M083 Juvenile rheumatoid polyarthritis (seronegative): Secondary | ICD-10-CM | POA: Diagnosis not present

## 2012-10-28 DIAGNOSIS — M19049 Primary osteoarthritis, unspecified hand: Secondary | ICD-10-CM | POA: Diagnosis not present

## 2012-10-29 DIAGNOSIS — J309 Allergic rhinitis, unspecified: Secondary | ICD-10-CM | POA: Diagnosis not present

## 2012-11-04 DIAGNOSIS — M19049 Primary osteoarthritis, unspecified hand: Secondary | ICD-10-CM | POA: Diagnosis not present

## 2012-11-04 DIAGNOSIS — S63269A Dislocation of metacarpophalangeal joint of unspecified finger, initial encounter: Secondary | ICD-10-CM | POA: Diagnosis not present

## 2012-11-05 DIAGNOSIS — J309 Allergic rhinitis, unspecified: Secondary | ICD-10-CM | POA: Diagnosis not present

## 2012-11-13 DIAGNOSIS — J019 Acute sinusitis, unspecified: Secondary | ICD-10-CM | POA: Diagnosis not present

## 2012-11-22 DIAGNOSIS — R05 Cough: Secondary | ICD-10-CM | POA: Diagnosis not present

## 2012-11-26 DIAGNOSIS — J309 Allergic rhinitis, unspecified: Secondary | ICD-10-CM | POA: Diagnosis not present

## 2012-12-17 DIAGNOSIS — J309 Allergic rhinitis, unspecified: Secondary | ICD-10-CM | POA: Diagnosis not present

## 2012-12-27 DIAGNOSIS — J309 Allergic rhinitis, unspecified: Secondary | ICD-10-CM | POA: Diagnosis not present

## 2012-12-30 ENCOUNTER — Ambulatory Visit (INDEPENDENT_AMBULATORY_CARE_PROVIDER_SITE_OTHER): Payer: Medicare Other | Admitting: *Deleted

## 2012-12-30 ENCOUNTER — Other Ambulatory Visit: Payer: Self-pay | Admitting: Internal Medicine

## 2012-12-30 DIAGNOSIS — I2589 Other forms of chronic ischemic heart disease: Secondary | ICD-10-CM

## 2012-12-30 DIAGNOSIS — Z95810 Presence of automatic (implantable) cardiac defibrillator: Secondary | ICD-10-CM

## 2012-12-31 LAB — REMOTE ICD DEVICE
AL IMPEDENCE ICD: 430 Ohm
BATTERY VOLTAGE: 2.6 V
DEVICE MODEL ICD: 702836
HV IMPEDENCE: 47 Ohm
LV LEAD IMPEDENCE ICD: 650 Ohm
RV LEAD AMPLITUDE: 9.7 mv
TZAT-0013SLOWVT: 3
TZAT-0018SLOWVT: NEGATIVE
TZAT-0019SLOWVT: 7.5 V
TZAT-0020SLOWVT: 1 ms
TZON-0003SLOWVT: 350 ms
TZON-0005SLOWVT: 6
TZON-0010SLOWVT: 80 ms
TZST-0001SLOWVT: 2
TZST-0001SLOWVT: 4
TZST-0001SLOWVT: 5
TZST-0003SLOWVT: 36 J
TZST-0003SLOWVT: 36 J
VENTRICULAR PACING ICD: 97 pct

## 2013-01-02 ENCOUNTER — Other Ambulatory Visit: Payer: Self-pay | Admitting: Gastroenterology

## 2013-01-02 DIAGNOSIS — R1013 Epigastric pain: Secondary | ICD-10-CM

## 2013-01-02 DIAGNOSIS — K3189 Other diseases of stomach and duodenum: Secondary | ICD-10-CM | POA: Diagnosis not present

## 2013-01-07 ENCOUNTER — Encounter: Payer: Self-pay | Admitting: *Deleted

## 2013-01-07 DIAGNOSIS — J309 Allergic rhinitis, unspecified: Secondary | ICD-10-CM | POA: Diagnosis not present

## 2013-01-13 ENCOUNTER — Ambulatory Visit
Admission: RE | Admit: 2013-01-13 | Discharge: 2013-01-13 | Disposition: A | Discharge status: Home / Self Care | Payer: Medicare Other | Source: Ambulatory Visit | Attending: Gastroenterology | Admitting: Gastroenterology

## 2013-01-13 DIAGNOSIS — L03119 Cellulitis of unspecified part of limb: Secondary | ICD-10-CM | POA: Diagnosis not present

## 2013-01-13 DIAGNOSIS — R0682 Tachypnea, not elsewhere classified: Secondary | ICD-10-CM | POA: Diagnosis not present

## 2013-01-13 DIAGNOSIS — L02519 Cutaneous abscess of unspecified hand: Secondary | ICD-10-CM | POA: Diagnosis not present

## 2013-01-13 DIAGNOSIS — R1013 Epigastric pain: Secondary | ICD-10-CM

## 2013-01-14 ENCOUNTER — Emergency Department (HOSPITAL_COMMUNITY)
Admission: EM | Admit: 2013-01-14 | Discharge: 2013-01-15 | Disposition: A | Discharge status: Home / Self Care | Payer: Medicare Other | Attending: Emergency Medicine | Admitting: Emergency Medicine

## 2013-01-14 ENCOUNTER — Encounter (HOSPITAL_COMMUNITY): Payer: Self-pay | Admitting: *Deleted

## 2013-01-14 DIAGNOSIS — Z951 Presence of aortocoronary bypass graft: Secondary | ICD-10-CM | POA: Diagnosis not present

## 2013-01-14 DIAGNOSIS — Z8739 Personal history of other diseases of the musculoskeletal system and connective tissue: Secondary | ICD-10-CM | POA: Diagnosis not present

## 2013-01-14 DIAGNOSIS — S60469A Insect bite (nonvenomous) of unspecified finger, initial encounter: Secondary | ICD-10-CM | POA: Insufficient documentation

## 2013-01-14 DIAGNOSIS — T6391XA Toxic effect of contact with unspecified venomous animal, accidental (unintentional), initial encounter: Secondary | ICD-10-CM | POA: Diagnosis not present

## 2013-01-14 DIAGNOSIS — E039 Hypothyroidism, unspecified: Secondary | ICD-10-CM | POA: Diagnosis not present

## 2013-01-14 DIAGNOSIS — I5022 Chronic systolic (congestive) heart failure: Secondary | ICD-10-CM | POA: Insufficient documentation

## 2013-01-14 DIAGNOSIS — Z9889 Other specified postprocedural states: Secondary | ICD-10-CM | POA: Diagnosis not present

## 2013-01-14 DIAGNOSIS — I10 Essential (primary) hypertension: Secondary | ICD-10-CM | POA: Diagnosis not present

## 2013-01-14 DIAGNOSIS — W57XXXA Bitten or stung by nonvenomous insect and other nonvenomous arthropods, initial encounter: Secondary | ICD-10-CM | POA: Insufficient documentation

## 2013-01-14 DIAGNOSIS — Z95810 Presence of automatic (implantable) cardiac defibrillator: Secondary | ICD-10-CM | POA: Insufficient documentation

## 2013-01-14 DIAGNOSIS — Z8709 Personal history of other diseases of the respiratory system: Secondary | ICD-10-CM | POA: Insufficient documentation

## 2013-01-14 DIAGNOSIS — Z8679 Personal history of other diseases of the circulatory system: Secondary | ICD-10-CM | POA: Diagnosis not present

## 2013-01-14 DIAGNOSIS — Z79899 Other long term (current) drug therapy: Secondary | ICD-10-CM | POA: Insufficient documentation

## 2013-01-14 DIAGNOSIS — Y929 Unspecified place or not applicable: Secondary | ICD-10-CM | POA: Insufficient documentation

## 2013-01-14 DIAGNOSIS — Y939 Activity, unspecified: Secondary | ICD-10-CM | POA: Insufficient documentation

## 2013-01-14 NOTE — ED Provider Notes (Signed)
History    This chart was scribed for non-physician practitioner working with Gwyneth Sprout, MD by Sofie Rower, ED Scribe. This patient was seen in room WTR1/WLPT1 and the patient's care was started at 10:36PM.    CSN: [id number]  Arrival date & time 01/14/13  2225   First MD Initiated Contact with Patient 01/14/13 2236      Chief Complaint  Patient presents with  . Insect Bite    (Consider location/radiation/quality/duration/timing/severity/associated sxs/prior treatment) The history is provided by the patient and the spouse. No language interpreter was used.    [name] is a 79 y.o. female , with a hx of ischemic cardiomyopathy, chronic systolic heart failure, chronic pulmonary disease, hypertension, DJD, abdominal hysterectomy, carpal tunnel release, appendicectomy, CABG (performed in 2010), repair extensor tendon, and finger arthroplasty (performed on 07/10/12, by Dr. Merlyn Lot at [facility]) who presents to the Emergency Department complaining of gradual, progressively worsening, insect bite, located at the right 3rd finger, onset six days ago (01/08/13).  Associated symptoms include swelling and erythema located at the right 3rd finger. The pt reports she was stung by a wasp on her right 3rd finger last Wednesday, 01/08/13, where she has since followed up with [facility] and prescribed Levaquin. The pt has taken two doses of her prescribed Levaquin, which she informs, does not provide relief of the symptoms associated with the insect bite. The pt is concerned due to the progressively worsening swelling and erythema, which may be an allergic reaction to the prescribed Levaquin.  The pt denies fever, chest pain, nausea, vomiting, shortness of breath.   The pt does not smoke or drink alcohol.   PCP is Dr. Kevan Ny.    Past Medical History  Diagnosis Date  . Ischemic cardiomyopathy     severe. Left ventricular ejection fraction 20%.   . Chronic systolic heart  failure     NYHA class II.  [name] Chronic pulmonary disease   . BBB (bundle branch block)     s/p BiV ICD implant  . Raynaud's syndrome   . Neuromuscular scoliosis of thoracolumbar region     type of scoliosis was not specified.   [name] DJD (degenerative joint disease), cervical   . DJD (degenerative joint disease), lumbar   . HTN (hypertension)   . Hyperthyroidism     following Graves disease  . Renal artery stenosis     Treated with angioplast in 1980 and 1987.   . S/P CABG (coronary artery bypass graft) April 2012  . FH: mitral valve repair     with 26 mm Edwards ring angioplasty,     Past Surgical History  Procedure Laterality Date  . Total abdominal hysterectomy    . Sympathectomy    . Tonsillectomy    . Renal artery ballon dilation    . Rotator cuff repair      right and left  . Laminotomy/foraminotomy      with decompression of the L4 nerve root   . Cervical fusion      C5-6 and C6-7, C4-5 with titanium plates  . Umbilical hernia repair    . Wedge resection  2001    for the right upper lobe for Aspergillus treatement.  Dr. Edwyna Shell apprix 2001.  [name] Ptca      of bilateral renal arteries  . Cataract extraction    . Carpal tunnel release    . Appendectomy    . Breast lumpectomy      left breast  . Renal  artery ballon dilation      x2  . Precancerous growth      tops of ear removed. bilateral.   . Coronary artery bypass graft  2010  . Implantation of icd  2010    BiV ICD implant (SJM) by Dr Amil Amen 03/2009  . Repair extensor tendon  07/10/2012    Procedure: REPAIR EXTENSOR TENDON;  Surgeon: Nicki Reaper, MD;  Location: [facility];  Service: Orthopedics;  Laterality: Right;  METACARPAL PHALANGEAL REPLACEMENT ARTHROPLASTIES RIGHT INDEX, MIDDLE, AND RING FINGERS  CENTRALIZATION EXTENSOR TENDONS INDEX, MIDDLE,  AND RING FINGER   . Finger arthroplasty  07/10/2012    Procedure: FINGER ARTHROPLASTY;  Surgeon: Nicki Reaper, MD;  Location: [facility];   Service: Orthopedics;  Laterality: Right;  METACARPAL PHALANGEAL ARTHROPLASTIES RIGHT INDEX, MIDDLE, AND RING FINGERS *TWO BLUE LOOPS LEFT IN AS DRAINS*    Family History  Problem Relation Age of Onset  . Heart disease Father   . Hypertension Father   . Colon cancer      grandmother    History  Substance Use Topics  . Smoking status: Never Smoker   . Smokeless tobacco: Not on file     Comment: passive smoker from birth to age 47 (mom and husband)  . Alcohol Use: No    OB History   Grav Para Term Preterm Abortions TAB SAB Ect Mult Living                  Review of Systems  Constitutional: Negative for fever.  Respiratory: Negative for shortness of breath.   Cardiovascular: Negative for chest pain.  Gastrointestinal: Negative for nausea and vomiting.  Skin: Positive for color change.  All other systems reviewed and are negative.    Allergies  Alprazolam; Aspirin; Calcitonin (salmon); Cefuroxime axetil; Cephalexin; Ciprofloxacin; Clonazepam; Doxycycline; Erythromycin; Hydrocodone; Hydrocodone-acetaminophen; Ketoconazole; Oxycodone-aspirin; Penicillins; Sulfamethoxazole w-trimethoprim; and Sulfonamide derivatives  Home Medications   Current Outpatient Rx  Name  Route  Sig  Dispense  Refill  . Calcium-Vitamin D-Vitamin K (VIACTIV FLAVOR GLIDES) 500-200-40 MG-UNT-MCG TABS   Oral   Take by mouth 2 (two) times daily.           . carvedilol (COREG) 12.5 MG tablet   Oral   Take 12.5 mg by mouth 2 (two) times daily.           . Cholecalciferol (VITAMIN D3) 2000 UNITS capsule   Oral   Take 2,000 Units by mouth daily.           [name] co-enzyme Q-10 50 MG capsule   Oral   Take 50 mg by mouth daily.         . fluconazole (DIFLUCAN) 100 MG tablet   Oral   Take 100 mg by mouth once a week.           . levothyroxine (SYNTHROID, LEVOTHROID) 100 MCG tablet   Oral   Take 100 mcg by mouth daily.           . misoprostol (CYTOTEC) 200 MCG tablet                 . montelukast (SINGULAIR) 10 MG tablet   Oral   Take 10 mg by mouth daily.           . nortriptyline (PAMELOR) 25 MG capsule   Oral   Take 30 mg by mouth daily.          . pantoprazole (PROTONIX) 40 MG  tablet   Oral   Take 40 mg by mouth daily.          . pentazocine-naloxone (TALWIN NX) 50-0.5 MG per tablet   Oral   Take 1 tablet by mouth every 4 (four) hours as needed for pain.   30 tablet   0   . simvastatin (ZOCOR) 40 MG tablet   Oral   Take 40 mg by mouth daily.           [name] triamcinolone cream (KENALOG) 0.1 %      As directed           BP 195/110  Pulse 66  Temp(Src) 97.4 F (36.3 C) (Oral)  Resp 18  SpO2 100%  Physical Exam  Nursing note and vitals reviewed. Constitutional: She is oriented to person, place, and time. She appears well-developed and well-nourished. No distress.  HENT:  Head: Normocephalic and atraumatic.  Right Ear: External ear normal.  Left Ear: External ear normal.  Nose: Nose normal.  Mouth/Throat: Oropharynx is clear and moist.  Eyes: Conjunctivae are normal.  Neck: Normal range of motion.  Cardiovascular: Normal rate, regular rhythm and normal heart sounds.   Pulmonary/Chest: Effort normal and breath sounds normal. No stridor. No respiratory distress. She has no wheezes. She has no rales.  Abdominal: Soft. She exhibits no distension.  Musculoskeletal: Normal range of motion.       Right hand: She exhibits swelling.       Hands: inflamed right middle finger with red marks on the pad of the distal third and fourth fingers.   Neurological: She is alert and oriented to person, place, and time. She has normal strength.  Skin: Skin is warm and dry. She is not diaphoretic. No erythema.  Psychiatric: She has a normal mood and affect. Her behavior is normal.    ED Course  Procedures (including critical care time)  DIAGNOSTIC STUDIES: Oxygen Saturation is 100% on room air, normal by my interpretation.    COORDINATION OF  CARE:  10:46 PM- Treatment plan concerning evaluation by attending physician discussed with patient. Pt agrees with treatment.  11:53 PM- Pt evaluated by Dr. Anitra Lauth (attending physician) Treatment plan discussed with patient. Pt agrees with treatment.  12:05 AM- Recheck. Treatment plan discussed with patient. Pt agrees with treatment.            Labs Reviewed - No data to display Dg Ugi W/high Density W/kub  01/13/2013  *RADIOLOGY REPORT*  Clinical Data:  Epigastric discomfort.  Possible ulcer.  UPPER GI SERIES WITH KUB  Technique:  Routine upper GI series was performed with thin and high density barium.  Fluoroscopy Time: 1 minute 12 seconds.  Comparison:  None.  Findings: Scout view of the abdomen shows a normal bowel gas pattern.  There is levoconvex rotatory scoliosis of the thoracolumbar spine.  Phleboliths are seen in the anatomic pelvis.  Double contrast examination of the upper gastrointestinal tract shows poor primary esophageal peristalsis.  No esophageal fold thickening, stricture or obstruction.  Stomach and duodenal bulb are normal.  A 13 mm barium pill passed into the stomach without difficulty.  IMPRESSION:  1.  Decreased esophageal motility. 2.  No definite gastric ulcer.   Original Report Authenticated By: Leanna Battles, M.D.      1. Allergic reaction to bee sting, initial encounter       MDM  Patient presents after a bee sting 6 days ago. Her finger began swelling 2 days ago. She was seen by [facility] and put on Levaquin. She had rapid worsening today. Her finger is itchy. This is more consistent with a delayed hypersensitivity reaction than infective. Given 40mg  of prednisone in ED and sent home with prednisone rx. Dr. Anitra Lauth evaluated this patient and agrees with plan. Follow up with PCP tomorrow. Return instructions given. She is hypertensive on discharge with a BP reading of 195/110. She will discuss this with her PCP as well. Vital signs stable for  discharge. Patient / Family / Caregiver informed of clinical course, understand medical decision-making process, and agree with plan.       I personally performed the services described in this documentation, which was scribed in my presence. The recorded information has been reviewed and is accurate.    Mora Bellman, PA-C 01/15/13 1336

## 2013-01-14 NOTE — ED Notes (Signed)
Pt states that she was stung by a wasp last Wednesday to rt middle finger; pt states that it has been swelling off and on since being stung; pt with swelling to right middle finger and top of rt hand; pt states that she is concerned and wants to have it evaluated.

## 2013-01-15 MED ORDER — PREDNISONE 20 MG PO TABS: ORAL_TABLET | ORAL | Status: DC

## 2013-01-15 MED ORDER — PREDNISONE 20 MG PO TABS
40.0000 mg | ORAL_TABLET | Freq: Once | ORAL | Status: AC
  Administered 2013-01-15: 40 mg via ORAL
  Filled 2013-01-15: qty 2

## 2013-01-16 NOTE — ED Provider Notes (Signed)
Medical screening examination/treatment/procedure(s) were conducted as a shared visit with non-physician practitioner(s) and myself.  I personally evaluated the patient during the encounter Pt with wasp sting to her finger 1 week ago with intermittent episodes of swelling and itching.  Pt's fingers appears to have allergic reaction.  Does not appear infected. No fluctuance, drainage or purulence.  Pt to f/u with hand.  Cannot appreciate any retained stingers  Gwyneth Sprout, MD 01/16/13 2233

## 2013-01-17 ENCOUNTER — Encounter: Payer: Self-pay | Admitting: Internal Medicine

## 2013-01-17 DIAGNOSIS — Z79899 Other long term (current) drug therapy: Secondary | ICD-10-CM | POA: Diagnosis not present

## 2013-01-17 DIAGNOSIS — J019 Acute sinusitis, unspecified: Secondary | ICD-10-CM | POA: Diagnosis not present

## 2013-01-17 DIAGNOSIS — I10 Essential (primary) hypertension: Secondary | ICD-10-CM | POA: Diagnosis not present

## 2013-01-20 DIAGNOSIS — J329 Chronic sinusitis, unspecified: Secondary | ICD-10-CM | POA: Diagnosis not present

## 2013-01-20 DIAGNOSIS — J310 Chronic rhinitis: Secondary | ICD-10-CM | POA: Diagnosis not present

## 2013-01-27 DIAGNOSIS — M1710 Unilateral primary osteoarthritis, unspecified knee: Secondary | ICD-10-CM | POA: Diagnosis not present

## 2013-01-28 DIAGNOSIS — J309 Allergic rhinitis, unspecified: Secondary | ICD-10-CM | POA: Diagnosis not present

## 2013-02-03 ENCOUNTER — Other Ambulatory Visit: Payer: Self-pay | Admitting: Internal Medicine

## 2013-02-03 DIAGNOSIS — R42 Dizziness and giddiness: Secondary | ICD-10-CM | POA: Diagnosis not present

## 2013-02-03 DIAGNOSIS — R51 Headache: Secondary | ICD-10-CM | POA: Diagnosis not present

## 2013-02-03 DIAGNOSIS — R2689 Other abnormalities of gait and mobility: Secondary | ICD-10-CM

## 2013-02-05 ENCOUNTER — Ambulatory Visit
Admission: RE | Admit: 2013-02-05 | Discharge: 2013-02-05 | Disposition: A | Discharge status: Home / Self Care | Payer: Medicare Other | Source: Ambulatory Visit | Attending: Internal Medicine | Admitting: Internal Medicine

## 2013-02-05 ENCOUNTER — Telehealth: Payer: Self-pay | Admitting: Internal Medicine

## 2013-02-05 DIAGNOSIS — R2689 Other abnormalities of gait and mobility: Secondary | ICD-10-CM

## 2013-02-05 DIAGNOSIS — R51 Headache: Secondary | ICD-10-CM | POA: Diagnosis not present

## 2013-02-05 NOTE — Telephone Encounter (Signed)
New problem   Pt want to confirm if she is on the list to attend the Annual ICD meeting for owners. Please call pt and leave a message if no one is there or leave message with spouse.

## 2013-02-18 DIAGNOSIS — J310 Chronic rhinitis: Secondary | ICD-10-CM | POA: Diagnosis not present

## 2013-02-18 DIAGNOSIS — M7710 Lateral epicondylitis, unspecified elbow: Secondary | ICD-10-CM | POA: Diagnosis not present

## 2013-02-18 DIAGNOSIS — J329 Chronic sinusitis, unspecified: Secondary | ICD-10-CM | POA: Diagnosis not present

## 2013-02-25 DIAGNOSIS — J309 Allergic rhinitis, unspecified: Secondary | ICD-10-CM | POA: Diagnosis not present

## 2013-02-26 DIAGNOSIS — Z954 Presence of other heart-valve replacement: Secondary | ICD-10-CM | POA: Diagnosis not present

## 2013-02-26 DIAGNOSIS — E7800 Pure hypercholesterolemia, unspecified: Secondary | ICD-10-CM | POA: Diagnosis not present

## 2013-02-26 DIAGNOSIS — I10 Essential (primary) hypertension: Secondary | ICD-10-CM | POA: Diagnosis not present

## 2013-02-26 DIAGNOSIS — Z95810 Presence of automatic (implantable) cardiac defibrillator: Secondary | ICD-10-CM | POA: Diagnosis not present

## 2013-02-26 DIAGNOSIS — I2510 Atherosclerotic heart disease of native coronary artery without angina pectoris: Secondary | ICD-10-CM | POA: Diagnosis not present

## 2013-02-27 DIAGNOSIS — R1013 Epigastric pain: Secondary | ICD-10-CM | POA: Diagnosis not present

## 2013-02-27 DIAGNOSIS — R1310 Dysphagia, unspecified: Secondary | ICD-10-CM | POA: Diagnosis not present

## 2013-03-13 ENCOUNTER — Other Ambulatory Visit: Payer: Self-pay | Admitting: Gastroenterology

## 2013-03-13 DIAGNOSIS — R1310 Dysphagia, unspecified: Secondary | ICD-10-CM | POA: Diagnosis not present

## 2013-03-13 DIAGNOSIS — K228 Other specified diseases of esophagus: Secondary | ICD-10-CM | POA: Diagnosis not present

## 2013-03-17 DIAGNOSIS — S63269A Dislocation of metacarpophalangeal joint of unspecified finger, initial encounter: Secondary | ICD-10-CM | POA: Diagnosis not present

## 2013-03-17 DIAGNOSIS — T6391XA Toxic effect of contact with unspecified venomous animal, accidental (unintentional), initial encounter: Secondary | ICD-10-CM | POA: Diagnosis not present

## 2013-03-18 DIAGNOSIS — I2510 Atherosclerotic heart disease of native coronary artery without angina pectoris: Secondary | ICD-10-CM | POA: Diagnosis not present

## 2013-03-18 DIAGNOSIS — D518 Other vitamin B12 deficiency anemias: Secondary | ICD-10-CM | POA: Diagnosis not present

## 2013-03-18 DIAGNOSIS — I10 Essential (primary) hypertension: Secondary | ICD-10-CM | POA: Diagnosis not present

## 2013-03-18 DIAGNOSIS — G4710 Hypersomnia, unspecified: Secondary | ICD-10-CM | POA: Diagnosis not present

## 2013-03-21 ENCOUNTER — Other Ambulatory Visit: Payer: Self-pay | Admitting: Gastroenterology

## 2013-03-26 ENCOUNTER — Encounter: Payer: Medicare Other | Admitting: Internal Medicine

## 2013-03-26 DIAGNOSIS — J309 Allergic rhinitis, unspecified: Secondary | ICD-10-CM | POA: Diagnosis not present

## 2013-03-31 NOTE — H&P (Signed)
Problem: Dysphagia  History: The patient is a 79 year old female born 04-21-1933.  In 2007, the patient underwent a normal esophagogastroduodenoscopy and colonoscopy.  In 2012, the patient underwent a normal barium esophagram. Esophagogastroduodenoscopy showed a benign prepyloric gastric ulcer which was biopsied. H. Pylori gastritis was not present.  To control gastroesophageal reflux, the patient takes Protonix each morning.  In the evening, after consuming supper, the patient experiences nausea without vomiting and mild epigastric discomfort. She reports no gastrointestinal bleeding. Her bowel function remains normal. She does not use nonsteroidal anti-inflammatory medication. She intermittently experiences esophageal dysphagia without odynophagia.  On Ardelle 19, 2014, the patient underwent a normal esophagogastroduodenoscopy with esophageal biopsies. The squamocolumnar junction was noted at 39 cm from the incisor teeth. No hiatal hernia was detected. Barium esophagram showed poor primary esophageal peristalsis; the barium tablet  traversed the esophagus without obstruction.  The patient is scheduled to undergo diagnostic esophageal manometry to rule out achalasia.  Past medical history: Osteopenia. Heart failure. Coronary artery bypass grafting. Mitral valve repair. Chronic obstructive pulmonary disease. Raynaud's syndrome. Lumbar spinal stenosis. Chronic constipation. Vitamin D deficiency. Hypertension. Hypothyroidism. Polycystic ovary disease. Renal artery stenosis. Intracardiac defibrillator placed. Prepyloric gastric ulcer diagnosed in 2012. Tonsillectomy. Appendectomy. Hemorrhoidectomy. Hysterectomy. Breast biopsy. Rotator cuff repair. Cervical spine fusions. Umbilical hernia repair. Right upper lobe lung lobectomy for aspergillosis fungus ball. Renal artery dilation. Facelift. Sinus surgery.Cataract surgery.  Allergies: Keflex. Tapazole. Nizoral. Griseofulvin. Amitriptyline. Klonopin.  Lorcet. Plendil. Serevent. Percodan. Miacalcin. Coenzyme Q 10. Albuterol inhalers. Morphine. Doxycycline.  Habits: The patient has never smoked cigarettes. She does not consume alcohol.  Plan: Schedule diagnostic esophageal manometry to rule out achalasia.

## 2013-04-07 ENCOUNTER — Ambulatory Visit (HOSPITAL_COMMUNITY)
Admission: RE | Admit: 2013-04-07 | Discharge: 2013-04-07 | Disposition: A | Discharge status: Home / Self Care | Payer: Medicare Other | Source: Ambulatory Visit | Attending: Gastroenterology | Admitting: Gastroenterology

## 2013-04-07 ENCOUNTER — Encounter (HOSPITAL_COMMUNITY): Payer: Self-pay | Admitting: *Deleted

## 2013-04-07 ENCOUNTER — Encounter (HOSPITAL_COMMUNITY)
Admission: RE | Disposition: A | Discharge status: Home / Self Care | Payer: Self-pay | Source: Ambulatory Visit | Attending: Gastroenterology

## 2013-04-07 DIAGNOSIS — J449 Chronic obstructive pulmonary disease, unspecified: Secondary | ICD-10-CM | POA: Diagnosis not present

## 2013-04-07 DIAGNOSIS — E039 Hypothyroidism, unspecified: Secondary | ICD-10-CM | POA: Diagnosis not present

## 2013-04-07 DIAGNOSIS — I10 Essential (primary) hypertension: Secondary | ICD-10-CM | POA: Diagnosis not present

## 2013-04-07 DIAGNOSIS — I7300 Raynaud's syndrome without gangrene: Secondary | ICD-10-CM | POA: Diagnosis not present

## 2013-04-07 DIAGNOSIS — Z95810 Presence of automatic (implantable) cardiac defibrillator: Secondary | ICD-10-CM | POA: Diagnosis not present

## 2013-04-07 DIAGNOSIS — Z951 Presence of aortocoronary bypass graft: Secondary | ICD-10-CM | POA: Diagnosis not present

## 2013-04-07 DIAGNOSIS — R110 Nausea: Secondary | ICD-10-CM | POA: Diagnosis not present

## 2013-04-07 DIAGNOSIS — R1310 Dysphagia, unspecified: Secondary | ICD-10-CM | POA: Diagnosis not present

## 2013-04-07 DIAGNOSIS — R1013 Epigastric pain: Secondary | ICD-10-CM | POA: Diagnosis not present

## 2013-04-07 HISTORY — PX: ESOPHAGEAL MANOMETRY: SHX5429

## 2013-04-07 HISTORY — DX: Chronic obstructive pulmonary disease, unspecified: J44.9

## 2013-04-07 SURGERY — MANOMETRY, ESOPHAGUS
Anesthesia: Topical

## 2013-04-07 MED ORDER — LIDOCAINE VISCOUS 2 % MT SOLN
OROMUCOSAL | Status: AC
  Filled 2013-04-07: qty 15

## 2013-04-07 SURGICAL SUPPLY — 1 items: DRAPE UTILITY 15X26 W/TAPE STR (DRAPE) ×2 IMPLANT

## 2013-04-08 ENCOUNTER — Encounter (HOSPITAL_COMMUNITY): Payer: Self-pay | Admitting: Gastroenterology

## 2013-04-16 DIAGNOSIS — D509 Iron deficiency anemia, unspecified: Secondary | ICD-10-CM | POA: Diagnosis not present

## 2013-04-16 DIAGNOSIS — IMO0001 Reserved for inherently not codable concepts without codable children: Secondary | ICD-10-CM | POA: Diagnosis not present

## 2013-04-16 DIAGNOSIS — I2510 Atherosclerotic heart disease of native coronary artery without angina pectoris: Secondary | ICD-10-CM | POA: Diagnosis not present

## 2013-04-16 DIAGNOSIS — I10 Essential (primary) hypertension: Secondary | ICD-10-CM | POA: Diagnosis not present

## 2013-04-16 DIAGNOSIS — Z0000 Encounter for general adult medical examination without abnormal findings: Secondary | ICD-10-CM | POA: Diagnosis not present

## 2013-04-16 DIAGNOSIS — R51 Headache: Secondary | ICD-10-CM | POA: Diagnosis not present

## 2013-04-16 DIAGNOSIS — Z1331 Encounter for screening for depression: Secondary | ICD-10-CM | POA: Diagnosis not present

## 2013-04-23 DIAGNOSIS — J309 Allergic rhinitis, unspecified: Secondary | ICD-10-CM | POA: Diagnosis not present

## 2013-04-30 ENCOUNTER — Encounter: Payer: Medicare Other | Admitting: Internal Medicine

## 2013-05-06 DIAGNOSIS — J309 Allergic rhinitis, unspecified: Secondary | ICD-10-CM | POA: Diagnosis not present

## 2013-05-13 DIAGNOSIS — J309 Allergic rhinitis, unspecified: Secondary | ICD-10-CM | POA: Diagnosis not present

## 2013-05-16 DIAGNOSIS — I10 Essential (primary) hypertension: Secondary | ICD-10-CM | POA: Diagnosis not present

## 2013-05-16 DIAGNOSIS — I2510 Atherosclerotic heart disease of native coronary artery without angina pectoris: Secondary | ICD-10-CM | POA: Diagnosis not present

## 2013-05-16 DIAGNOSIS — R51 Headache: Secondary | ICD-10-CM | POA: Diagnosis not present

## 2013-05-16 DIAGNOSIS — M542 Cervicalgia: Secondary | ICD-10-CM | POA: Diagnosis not present

## 2013-05-16 DIAGNOSIS — D509 Iron deficiency anemia, unspecified: Secondary | ICD-10-CM | POA: Diagnosis not present

## 2013-05-16 DIAGNOSIS — IMO0001 Reserved for inherently not codable concepts without codable children: Secondary | ICD-10-CM | POA: Diagnosis not present

## 2013-05-27 DIAGNOSIS — J309 Allergic rhinitis, unspecified: Secondary | ICD-10-CM | POA: Diagnosis not present

## 2013-06-02 DIAGNOSIS — J329 Chronic sinusitis, unspecified: Secondary | ICD-10-CM | POA: Diagnosis not present

## 2013-06-04 DIAGNOSIS — J309 Allergic rhinitis, unspecified: Secondary | ICD-10-CM | POA: Diagnosis not present

## 2013-06-05 ENCOUNTER — Ambulatory Visit (INDEPENDENT_AMBULATORY_CARE_PROVIDER_SITE_OTHER): Payer: Medicare Other | Admitting: Internal Medicine

## 2013-06-05 ENCOUNTER — Encounter: Payer: Self-pay | Admitting: Internal Medicine

## 2013-06-05 VITALS — BP 128/84 | HR 76 | Ht 60.0 in | Wt 117.4 lb

## 2013-06-05 DIAGNOSIS — R0602 Shortness of breath: Secondary | ICD-10-CM | POA: Diagnosis not present

## 2013-06-05 MED ORDER — TIOTROPIUM BROMIDE MONOHYDRATE 18 MCG IN CAPS: 18.0000 ug | ORAL_CAPSULE | Freq: Every day | RESPIRATORY_TRACT | Status: DC

## 2013-06-05 NOTE — Patient Instructions (Addendum)
Please start spiriva 1 puff daily - take script and show technique REturn in 6 months to see if this helps with dyspnea

## 2013-06-05 NOTE — Progress Notes (Signed)
Subjective:    Patient ID: [name], female    DOB: 06-25-1933, 79 y.o.   MRN: [id number]  HPI #Hx of passive smoking   - 1ppd mom and husband  #Hx of Mitral Valve repair with CABG - April 2010 and AICD in July 2010  #Hx of  Raynauds and GERD without sclerosis of skin  #. Chronic obstructive pulmonary disease. vs Asthma NOS per Hx - per patient: dxed by Dr. [place] Callas late 1990s but apparently dx rescinded in 2001 by Dr. Edwyna Shell - on singulair for years and reportedly told by Dr. Lazarus Salines at [facility] "never come off it" - normal pft 2010 except for small airways  #Chronic dyspnea - since early to mid 2000s  - multifactorial  -  Wedge resection for the right upper lobe for Aspergillus treatment-2001   -chronic systolic chf:  ef 25% in 2010 with et 6-7 steps, improved to 50% in 2013 with ET to inclines  - baseline scoliosis  -  Emphysema nos - seen on CT 2010 (passive smoker)  -- normal pft 2010 except for small airways  - failed MDI and rehab Rx in 2009-2010  - concern for exertional desaturation with pulse ox but not borne out in exertional ABG in 2010  - not on drugs or diet that will induce methemoglobinemia - Sept 2014 (hgb 14gm% in OCt 2013)    OV 05/16/2012 79 year old lady. Seeing her after nearly 3 years. Last seen in 2010. At that time seen for dyspnea (as outlined above). There was concern about exertional hypoxemia despite normal DLCO on PFT. ABG Sept 2010 both pre- and immediate post exercise showed normal P02. So circulatory impairment in skin was considered etiology for lower pulse ox.. This was subsequently conformed in rehab. Since then overall dyspnea some better  But still dyspneic. Per cardiology DR Allred notes [place] 2013 - ef has improved to 45-50%. Concomitant with this there seems to be some improvement in dysonea but she is unsure. She still dysponeic for walking inclines but no dyspnea for groceries, slow 1 flight sttairs, ADLs at home. Dyspnea immproed by rest but  never by prior trial of different mdi or even physical therapy.  Denies cough, orthopnea, wheeze, edema, other resp issues.  Current main issue is : preop clearance for finger arthroplasty by Dr Merlyn Lot. . She is unsure why she needs preop clearance. She feels she is low risk for hand surrgery because she has had many surgeries (see below) in past without anesthesia issues. .   However,  but I am presuming on account of finger pulse ox exetional hypxoemia that she is known to have (see 2010 notes, and today 185 feet 1 lap desaturated to 76% but poor wave form on meter) and considered due to poor circulation in 2010 (because exertional aBG was fine in 2010). She has had many surgeries in past (outlined below) all without complications    OV 06/20/2012 Dyspnea followup. No no new issues since last visit 05/16/12. YEst to have hand surgery due to schedule issues. Still has dyspnea on exertion which is unchanged. Rates it as mild and stable. Definitely exertional. Definitely relieved by rest. Dyspnea brought on by walking incline, or 7-8 steps walkign fast but no dyspnea for groceries, change in clothes, eating, doing ADLs. In past pulmonary rehab did not help. In past MDIs did not work  PFT 06/20/12: fev1 1.3L/88%, Ratop 66. No BD resoinse. TLC 3/7L/93%, DLCO 11.6/82% - NORMAL  She does not want flu shot  today   Past, Family, Social reviewed: no change since last visit   Your breathing test is normal.  YOur shortness of breath is likely due to fitness, heart, spine curvature and prior lung resection  The oxygen drop they see with your finger is likely an artifact tied to poor circulation  As discussed we will continue to observe  I will see you back in 1 year or sooner if needed   OV 06/05/2013 Dyspnea followu after one year. Sl has dyspnea on exertion which is unchanged. Rates it as mild and stable. Definitely exertional. Definitely relieved by rest. Dyspnea brought on by walking incline, or 7-8  steps walkign fast but no dyspnea for groceries, change in clothes, eating, doing ADLs. In past pulmonary rehab did not help. In past MDIs did not work but she is willing to try spiriva this time around.   She has low EF and has not foilowe dwith cards in a long time; but going to see Dr Anne Fu soon per hxx  Pulse ox again low at rest. I reviewd for methgb and could not find her on drugs or eating fava beans and a candidate for this issue. HJGb Oct 2013 was normal  She is willing to try spiriva this time around       Review of Systems  Constitutional: Negative for fever and unexpected weight change.  HENT: Negative for ear pain, nosebleeds, congestion, sore throat, rhinorrhea, sneezing, trouble swallowing, dental problem, postnasal drip and sinus pressure.   Eyes: Negative for redness and itching.  Respiratory: Negative for cough, chest tightness, shortness of breath and wheezing.   Cardiovascular: Negative for palpitations and leg swelling.  Gastrointestinal: Negative for nausea and vomiting.  Genitourinary: Negative for dysuria.  Musculoskeletal: Negative for joint swelling.  Skin: Negative for rash.  Neurological: Negative for headaches.  Hematological: Does not bruise/bleed easily.  Psychiatric/Behavioral: Negative for dysphoric mood. The patient is not nervous/anxious.    Current outpatient prescriptions:carvedilol (COREG) 12.5 MG tablet, Take 12.5 mg by mouth 2 (two) times daily.  , Disp: , Rfl: ;  Cholecalciferol (VITAMIN D3) 2000 UNITS capsule, Take 2,000 Units by mouth daily.  , Disp: , Rfl: ;  DIOVAN 80 MG tablet, Take 0.5 tablets by mouth daily., Disp: , Rfl: ;  fluconazole (DIFLUCAN) 100 MG tablet, Take 100 mg by mouth once a week.  , Disp: , Rfl:  levothyroxine (SYNTHROID, LEVOTHROID) 100 MCG tablet, Take 100 mcg by mouth daily.  , Disp: , Rfl: ;  montelukast (SINGULAIR) 10 MG tablet, Take 10 mg by mouth daily.  , Disp: , Rfl: ;  nortriptyline (PAMELOR) 25 MG capsule, Take 15  mg by mouth daily. , Disp: , Rfl: ;  pantoprazole (PROTONIX) 40 MG tablet, Take 40 mg by mouth daily. , Disp: , Rfl: ;  simvastatin (ZOCOR) 40 MG tablet, Take 40 mg by mouth daily.  , Disp: , Rfl:  triamcinolone cream (KENALOG) 0.1 %, As directed, Disp: , Rfl:      Objective:   Physical Exam General:  thin.  thin.  Body mass index is 21.83 kg/(m^2). Head:  normocephalic and atraumatic Eyes:  PERRLA/EOM intact; conjunctiva and sclera clear Ears:  TMs intact and clear with normal canals Nose:  no deformity, discharge, inflammation, or lesions Mouth:  no deformity or lesions Neck:  no masses, thyromegaly, or abnormal cervical nodes Chest Wall:  scar of surgery pluse Lungs:  decreased BS bilateral.  clear Heart:  regular rate and rhythm, S1, S2 without murmurs, rubs,  gallops, or clicks  click of mitral valve + Abdomen:  bowel sounds positive; abdomen soft and non-tender without masses, or organomegaly Msk:  no deformity or scoliosis noted with normal posture Pulses:  pulses normal Extremities:  no clubbing, cyanosis, edema, or deformity noted Neurologic:  CN II-XII grossly intact with normal reflexes, coordination, muscle strength and tone Skin:  intact without lesions or rashes no scleroderma Cervical Nodes:  no significant adenopathy Axillary Nodes:  no significant adenopathy Psych:  alert and cooperative; normal mood and affect; normal attention span and concentration           Assessment & Plan:

## 2013-06-08 NOTE — Assessment & Plan Note (Signed)
Please start spiriva 1 puff daily - take script and show technique REturn in 6 months to see if this helps with dyspne Still feel low finger pulse ox is due to digital decreaes in circulation

## 2013-06-09 ENCOUNTER — Encounter: Payer: Self-pay | Admitting: Internal Medicine

## 2013-06-09 ENCOUNTER — Ambulatory Visit (INDEPENDENT_AMBULATORY_CARE_PROVIDER_SITE_OTHER): Payer: Medicare Other | Admitting: Internal Medicine

## 2013-06-09 VITALS — BP 118/72 | HR 83 | Ht 60.0 in | Wt 115.0 lb

## 2013-06-09 DIAGNOSIS — Z95810 Presence of automatic (implantable) cardiac defibrillator: Secondary | ICD-10-CM | POA: Diagnosis not present

## 2013-06-09 DIAGNOSIS — I428 Other cardiomyopathies: Secondary | ICD-10-CM | POA: Diagnosis not present

## 2013-06-09 DIAGNOSIS — I5022 Chronic systolic (congestive) heart failure: Secondary | ICD-10-CM

## 2013-06-09 DIAGNOSIS — I2589 Other forms of chronic ischemic heart disease: Secondary | ICD-10-CM | POA: Diagnosis not present

## 2013-06-09 DIAGNOSIS — I10 Essential (primary) hypertension: Secondary | ICD-10-CM | POA: Diagnosis not present

## 2013-06-09 LAB — ICD DEVICE OBSERVATION
AL AMPLITUDE: 2.9 mv
BATTERY VOLTAGE: 2.5869 V
DEVICE MODEL ICD: 702836
FVT: 0
LV LEAD THRESHOLD: 1 V
MODE SWITCH EPISODES: 0
RV LEAD AMPLITUDE: 11.8 mv
RV LEAD IMPEDENCE ICD: 425 Ohm
TOT-0008: 0
TOT-0010: 29
TZAT-0013SLOWVT: 3
TZAT-0018SLOWVT: NEGATIVE
TZAT-0019SLOWVT: 7.5 V
TZAT-0020SLOWVT: 1 ms
TZON-0003SLOWVT: 350 ms
TZON-0005SLOWVT: 6
TZON-0010SLOWVT: 80 ms
TZST-0001SLOWVT: 2
TZST-0001SLOWVT: 4
TZST-0001SLOWVT: 5
TZST-0003SLOWVT: 36 J
TZST-0003SLOWVT: 36 J
VENTRICULAR PACING ICD: 97 pct
VF: 0

## 2013-06-09 NOTE — Progress Notes (Signed)
. PCP: Pearla Dubonnet, MD Primary Cardiologist:  Dr Anne Fu  [name] is a 79 y.o. female who presents today for routine electrophysiology followup. She reports stable but significant fatigue and SOB.  Today, she denies symptoms of palpitations, chest pain,    lower extremity edema, dizziness, presyncope, syncope, or ICD shocks.  The patient is otherwise without complaint today.   Past Medical History  Diagnosis Date  . Ischemic cardiomyopathy     severe. Left ventricular ejection fraction 20%.   . Chronic systolic heart failure     NYHA class II.  [name] Chronic pulmonary disease   . BBB (bundle branch block)     s/p BiV ICD implant  . Raynaud's syndrome   . Neuromuscular scoliosis of thoracolumbar region     type of scoliosis was not specified.   [name] DJD (degenerative joint disease), cervical   . DJD (degenerative joint disease), lumbar   . HTN (hypertension)   . Hyperthyroidism     following Graves disease  . Renal artery stenosis     Treated with angioplast in 1980 and 1987.   . S/P CABG (coronary artery bypass graft) April 2012  . FH: mitral valve repair     with 26 mm Edwards ring angioplasty,   . COPD (chronic obstructive pulmonary disease)    Past Surgical History  Procedure Laterality Date  . Total abdominal hysterectomy    . Sympathectomy    . Tonsillectomy    . Renal artery ballon dilation    . Rotator cuff repair      right and left  . Laminotomy/foraminotomy      with decompression of the L4 nerve root   . Cervical fusion      C5-6 and C6-7, C4-5 with titanium plates  . Umbilical hernia repair    . Wedge resection  2001    for the right upper lobe for Aspergillus treatement.  Dr. Edwyna Shell apprix 2001.  [name] Ptca      of bilateral renal arteries  . Cataract extraction    . Carpal tunnel release    . Appendectomy    . Breast lumpectomy      left breast  . Renal artery ballon dilation      x2  . Precancerous growth      tops of ear removed. bilateral.    . Coronary artery bypass graft  2010  . Implantation of icd  2010    BiV ICD implant (SJM) by Dr Amil Amen 03/2009  . Repair extensor tendon  07/10/2012    Procedure: REPAIR EXTENSOR TENDON;  Surgeon: Nicki Reaper, MD;  Location: [facility];  Service: Orthopedics;  Laterality: Right;  METACARPAL PHALANGEAL REPLACEMENT ARTHROPLASTIES RIGHT INDEX, MIDDLE, AND RING FINGERS  CENTRALIZATION EXTENSOR TENDONS INDEX, MIDDLE,  AND RING FINGER   . Finger arthroplasty  07/10/2012    Procedure: FINGER ARTHROPLASTY;  Surgeon: Nicki Reaper, MD;  Location:  SUR[facility]ERY C[facility];  Service: Orthopedics;  Laterality: Right;  METACARPAL PHALANGEAL ARTHROPLASTIES RIGHT INDEX, MIDDLE, AND RING FINGERS *TWO BLUE LOOPS LEFT IN AS DRAINS*  . Esophageal manometry N/A 04/07/2013    Procedure: ESOPHAGEAL MANOMETRY (EM);  Surgeon: Charolett Bumpers, MD;  Location: [facility];  Service: Endoscopy;  Laterality: N/A;    Current Outpatient Prescriptions  Medication Sig Dispense Refill  . carvedilol (COREG) 12.5 MG tablet Take 12.5 mg by mouth 2 (two) times daily.        . Cholecalciferol (VITAMIN D3) 2000 UNITS capsule Take  2,000 Units by mouth daily.        [name] DIOVAN 80 MG tablet Take 0.5 tablets by mouth daily.      . fluconazole (DIFLUCAN) 100 MG tablet Take 100 mg by mouth once a week.        . levothyroxine (SYNTHROID, LEVOTHROID) 100 MCG tablet Take 100 mcg by mouth daily.        . montelukast (SINGULAIR) 10 MG tablet Take 10 mg by mouth daily.        . nortriptyline (PAMELOR) 25 MG capsule Take 15 mg by mouth daily.       . pantoprazole (PROTONIX) 40 MG tablet Take 40 mg by mouth daily.       . simvastatin (ZOCOR) 40 MG tablet Take 40 mg by mouth daily.        [name] tiotropium (SPIRIVA) 18 MCG inhalation capsule Place 1 capsule (18 mcg total) into inhaler and inhale daily.  30 capsule  6   No current facility-administered medications for this visit.    Physical Exam: Filed Vitals:   06/09/13 1201   BP: 118/72  Pulse: 83  Height: 5' (1.524 m)  Weight: 115 lb (52.164 kg)    GEN- The patient is well appearing, alert and oriented x 3 today.   Head- normocephalic, atraumatic Eyes-  Sclera clear, conjunctiva pink Ears- hearing intact Oropharynx- clear Lungs- Clear to ausculation bilaterally, normal work of breathing Chest- ICD pocket is well healed Heart- Regular rate and rhythm, no murmurs, rubs or gallops, PMI not laterally displaced GI- soft, NT, ND, + BS Extremities- no clubbing, cyanosis, or edema  ICD interrogation- reviewed in detail today,  See PACEART report   ekg today reveals sinus rhythm with BiV pacing  Assessment and Plan:  1. Chronic systolic dysfunction euvolemic today Normal BiV ICD function See Pace Art report No changes today  2. HTN Stable No change required today  Merlin Return in 1 year

## 2013-06-09 NOTE — Patient Instructions (Addendum)
Your physician wants you to follow-up in: 12 months with Dr Allred You will receive a reminder letter in the mail two months in advance. If you don't receive a letter, please call our office to schedule the follow-up appointment.   Remote monitoring is used to monitor your Pacemaker or ICD from home. This monitoring reduces the number of office visits required to check your device to one time per year. It allows us to keep an eye on the functioning of your device to ensure it is working properly. You are scheduled for a device check from home on 09/08/13. You may send your transmission at any time that day. If you have a wireless device, the transmission will be sent automatically. After your physician reviews your transmission, you will receive a postcard with your next transmission date.   

## 2013-06-11 DIAGNOSIS — M19049 Primary osteoarthritis, unspecified hand: Secondary | ICD-10-CM | POA: Diagnosis not present

## 2013-06-24 DIAGNOSIS — J329 Chronic sinusitis, unspecified: Secondary | ICD-10-CM | POA: Diagnosis not present

## 2013-06-24 DIAGNOSIS — R42 Dizziness and giddiness: Secondary | ICD-10-CM | POA: Diagnosis not present

## 2013-06-30 ENCOUNTER — Encounter: Payer: Self-pay | Admitting: Internal Medicine

## 2013-07-01 DIAGNOSIS — J309 Allergic rhinitis, unspecified: Secondary | ICD-10-CM | POA: Diagnosis not present

## 2013-07-02 ENCOUNTER — Telehealth: Payer: Self-pay | Admitting: Internal Medicine

## 2013-07-02 NOTE — Telephone Encounter (Signed)
Pt wanted to know will she get a new inhaler device every time she gets a refill on her spiriva. I advised that this is they way the spiriva is packaged though the company and that we cannot change this. Pt sates understanding. Carron Curie, CMA

## 2013-07-08 DIAGNOSIS — J329 Chronic sinusitis, unspecified: Secondary | ICD-10-CM | POA: Diagnosis not present

## 2013-07-16 ENCOUNTER — Other Ambulatory Visit: Payer: Self-pay | Admitting: Neurological Surgery

## 2013-07-16 ENCOUNTER — Ambulatory Visit (INDEPENDENT_AMBULATORY_CARE_PROVIDER_SITE_OTHER): Payer: Medicare Other | Admitting: *Deleted

## 2013-07-16 ENCOUNTER — Encounter: Payer: Self-pay | Admitting: Cardiology

## 2013-07-16 ENCOUNTER — Ambulatory Visit (INDEPENDENT_AMBULATORY_CARE_PROVIDER_SITE_OTHER): Payer: Medicare Other | Admitting: Cardiology

## 2013-07-16 VITALS — BP 158/76 | HR 67 | Ht 60.0 in | Wt 117.2 lb

## 2013-07-16 DIAGNOSIS — Z8249 Family history of ischemic heart disease and other diseases of the circulatory system: Secondary | ICD-10-CM

## 2013-07-16 DIAGNOSIS — I2589 Other forms of chronic ischemic heart disease: Secondary | ICD-10-CM | POA: Diagnosis not present

## 2013-07-16 DIAGNOSIS — E785 Hyperlipidemia, unspecified: Secondary | ICD-10-CM

## 2013-07-16 DIAGNOSIS — I5022 Chronic systolic (congestive) heart failure: Secondary | ICD-10-CM | POA: Diagnosis not present

## 2013-07-16 DIAGNOSIS — J4489 Other specified chronic obstructive pulmonary disease: Secondary | ICD-10-CM

## 2013-07-16 DIAGNOSIS — I255 Ischemic cardiomyopathy: Secondary | ICD-10-CM

## 2013-07-16 DIAGNOSIS — Z951 Presence of aortocoronary bypass graft: Secondary | ICD-10-CM

## 2013-07-16 DIAGNOSIS — I454 Nonspecific intraventricular block: Secondary | ICD-10-CM

## 2013-07-16 DIAGNOSIS — M47812 Spondylosis without myelopathy or radiculopathy, cervical region: Secondary | ICD-10-CM | POA: Diagnosis not present

## 2013-07-16 DIAGNOSIS — Z95810 Presence of automatic (implantable) cardiac defibrillator: Secondary | ICD-10-CM | POA: Diagnosis not present

## 2013-07-16 DIAGNOSIS — J449 Chronic obstructive pulmonary disease, unspecified: Secondary | ICD-10-CM

## 2013-07-16 DIAGNOSIS — I10 Essential (primary) hypertension: Secondary | ICD-10-CM

## 2013-07-16 NOTE — Progress Notes (Signed)
[address] [place], [place]  [address] Phone: [phone] Fax:  [phone]  Date:  07/16/2013   ID:  [name], DOB 02-Aug-1933, MRN [id number]  PCP:  Pearla Dubonnet, MD   History of Present Illness: [name] is a 79 y.o. female ischemic/nonischemic cardiomyopathy status post bypass surgery as well as mitral valve repair with biventricular defibrillator, [employer] with partial resolution of her ejection fraction from 10% up to 45-50% in 2013 here for followup.  Recent visit with Dr. Eula Listen reviewed. She was experiencing some dizziness which has thus resolve.  Not like she was going to pass out, but not dizzy. Had to sit in a chair. Like a wavy feeling. Really tired. After a few seconds passed today. Prior episode end of September and lasted longer. Challenging for her to completely describe. No frank syncope however. Earlier, she thought she may have just been dehydrated however this morning she is fairly certain that she was not.  Dr. Danielle Dess saw her earlier today because of her cervical spine issues. She states that she's had slight shifting of C3 and C4. This can be quite painful for her.  Wt Readings from Last 3 Encounters:  07/16/13 117 lb 3.2 oz (53.162 kg)  06/09/13 115 lb (52.164 kg)  06/05/13 117 lb 6.4 oz (53.252 kg)     Past Medical History  Diagnosis Date  . Ischemic cardiomyopathy     severe. Left ventricular ejection fraction 20%.   . Chronic systolic heart failure     NYHA class II.  [name] Chronic pulmonary disease   . BBB (bundle branch block)     s/p BiV ICD implant  . Raynaud's syndrome   . Neuromuscular scoliosis of thoracolumbar region     type of scoliosis was not specified.   [name] DJD (degenerative joint disease), cervical   . DJD (degenerative joint disease), lumbar   . HTN (hypertension)   . Hyperthyroidism     following Graves disease  . Renal artery stenosis     Treated with angioplast in 1980 and 1987.   . S/P CABG  (coronary artery bypass graft) April 2012  . FH: mitral valve repair     with 26 mm Edwards ring angioplasty,   . COPD (chronic obstructive pulmonary disease)     Past Surgical History  Procedure Laterality Date  . Total abdominal hysterectomy    . Sympathectomy    . Tonsillectomy    . Renal artery ballon dilation    . Rotator cuff repair      right and left  . Laminotomy/foraminotomy      with decompression of the L4 nerve root   . Cervical fusion      C5-6 and C6-7, C4-5 with titanium plates  . Umbilical hernia repair    . Wedge resection  2001    for the right upper lobe for Aspergillus treatement.  Dr. Edwyna Shell apprix 2001.  [name] Ptca      of bilateral renal arteries  . Cataract extraction    . Carpal tunnel release    . Appendectomy    . Breast lumpectomy      left breast  . Renal artery ballon dilation      x2  . Precancerous growth      tops of ear removed. bilateral.   . Coronary artery bypass graft  2010  . Implantation of icd  2010    BiV ICD implant (SJM) by Dr  Edmunds 03/2009  . Repair extensor tendon  07/10/2012    Procedure: REPAIR EXTENSOR TENDON;  Surgeon: Nicki Reaper, MD;  Location: [facility];  Service: Orthopedics;  Laterality: Right;  METACARPAL PHALANGEAL REPLACEMENT ARTHROPLASTIES RIGHT INDEX, MIDDLE, AND RING FINGERS  CENTRALIZATION EXTENSOR TENDONS INDEX, MIDDLE,  AND RING FINGER   . Finger arthroplasty  07/10/2012    Procedure: FINGER ARTHROPLASTY;  Surgeon: Nicki Reaper, MD;  Location: [facility];  Service: Orthopedics;  Laterality: Right;  METACARPAL PHALANGEAL ARTHROPLASTIES RIGHT INDEX, MIDDLE, AND RING FINGERS *TWO BLUE LOOPS LEFT IN AS DRAINS*  . Esophageal manometry N/A 04/07/2013    Procedure: ESOPHAGEAL MANOMETRY (EM);  Surgeon: Charolett Bumpers, MD;  Location: [facility];  Service: Endoscopy;  Laterality: N/A;    Current Outpatient Prescriptions  Medication Sig Dispense Refill  . carvedilol (COREG) 12.5 MG  tablet Take 12.5 mg by mouth 2 (two) times daily.        . Cholecalciferol (VITAMIN D3) 2000 UNITS capsule Take 2,000 Units by mouth daily.        [name] DIOVAN 80 MG tablet Take 0.5 tablets by mouth daily.      . fluconazole (DIFLUCAN) 100 MG tablet Take 100 mg by mouth once a week.        . levothyroxine (SYNTHROID, LEVOTHROID) 100 MCG tablet Take 100 mcg by mouth daily.        . montelukast (SINGULAIR) 10 MG tablet Take 10 mg by mouth daily.        . nortriptyline (PAMELOR) 25 MG capsule Take 15 mg by mouth daily.       . pantoprazole (PROTONIX) 40 MG tablet Take 40 mg by mouth daily.       . simvastatin (ZOCOR) 40 MG tablet Take 40 mg by mouth daily.        [name] tiotropium (SPIRIVA) 18 MCG inhalation capsule Place 1 capsule (18 mcg total) into inhaler and inhale daily.  30 capsule  6   No current facility-administered medications for this visit.    Allergies:    Allergies  Allergen Reactions  . Alprazolam     REACTION: ulcer's in mouth and extreme constipation  . Aspirin     REACTION: upsets stomach  . Calcitonin ([place])     REACTION: rash over entire body  . Cefuroxime Axetil     REACTION: either rash and diarrhea  . Cephalexin     REACTION: rash  . Ciprofloxacin     REACTION: rash  . Clonazepam     REACTION: 1/2 pill makes grogginess next day  . Doxycycline     REACTION: severe rash over entire body  . Erythromycin     REACTION: rash  . Hydrocodone     REACTION: nausea and totally out of it  . Hydrocodone-Acetaminophen     REACTION: reaction forgeotten  . Ketoconazole     REACTION: terribly weak, voice shook  . Oxycodone-Aspirin     REACTION: nausea  . Penicillins     REACTION: rash  . Sulfamethoxazole-Trimethoprim     REACTION: either rash or diarrhea  . Sulfonamide Derivatives     REACTION: reaction forgotten    Social History:  The patient  reports that she has never smoked. She does not have any smokeless tobacco history on file. She reports that she does not  drink alcohol.   ROS:  Please see the history of present illness.   Denies any fevers, chills, chest pain, orthopnea. She is not complaining  currently of shortness of breath. Positive neck pain.   All other systems reviewed and negative.   PHYSICAL EXAM: VS:  BP 158/76  Pulse 67  Ht 5' (1.524 m)  Wt 117 lb 3.2 oz (53.162 kg)  BMI 22.89 kg/m2  SpO2 99% Thin, elderly, in no acute distress HEENT: normalCervical neck scar is noted Neck: no JVD Cardiac:  normal S1, S2; RRR; occasional ectopy, what sounds like brief PAT, no murmur Lungs:  clear to auscultation bilaterally, no wheezing, rhonchi or rales Abd: soft, nontender, no hepatomegaly Ext: no edema Skin: warm and dry Neuro: no focal abnormalities noted  EKG:  Defibrillator interrogated today. Previous interrogation in September demonstrated 6 mode switches approximately 10 seconds duration. There did not seem to be any correlation with symptoms this morning/today and any mode switch or arrhythmia.  ASSESSMENT AND PLAN:  1. Dizziness-difficult to fully describe. No frank syncope. Question autonomic. Could her cervical spine/neck pain be playing a role? Most recent ejection fraction reassuring at 45-50%. Blood pressure currently on the upper/mildly elevated side. I do not wish to decrease her antihypertensives at this time. 2. Ischemic cardiomyopathy/nonischemic-reassuring improvement of ejection fraction from 10% up to 45-50%. 3. Prior mitral valve repair 4. Prior bypass, CABG 5. Bundle branch block-underlying, chronic. Currently biventricular paced   Signed, Donato Schultz, MD [facility]  07/16/2013 2:45 PM

## 2013-07-16 NOTE — Patient Instructions (Addendum)
Your physician recommends that you continue on your current medications as directed. Please refer to the Current Medication list given to you today.  Your physician wants you to follow-up in: 6 months with Dr. Skains. You will receive a reminder letter in the mail two months in advance. If you don't receive a letter, please call our office to schedule the follow-up appointment.  

## 2013-07-18 DIAGNOSIS — H5210 Myopia, unspecified eye: Secondary | ICD-10-CM | POA: Diagnosis not present

## 2013-07-18 DIAGNOSIS — H52209 Unspecified astigmatism, unspecified eye: Secondary | ICD-10-CM | POA: Diagnosis not present

## 2013-07-18 DIAGNOSIS — Z961 Presence of intraocular lens: Secondary | ICD-10-CM | POA: Diagnosis not present

## 2013-07-22 ENCOUNTER — Ambulatory Visit
Admission: RE | Admit: 2013-07-22 | Discharge: 2013-07-22 | Disposition: A | Discharge status: Home / Self Care | Payer: Medicare Other | Source: Ambulatory Visit | Attending: Neurological Surgery | Admitting: Neurological Surgery

## 2013-07-22 DIAGNOSIS — M4802 Spinal stenosis, cervical region: Secondary | ICD-10-CM | POA: Diagnosis not present

## 2013-07-22 DIAGNOSIS — M47812 Spondylosis without myelopathy or radiculopathy, cervical region: Secondary | ICD-10-CM

## 2013-07-25 LAB — ICD DEVICE OBSERVATION
BAMS-0001: 150 {beats}/min
BAMS-0003: 70 {beats}/min
DEVICE MODEL ICD: 702836
TZAT-0001SLOWVT: 1
TZAT-0004SLOWVT: 8
TZAT-0012SLOWVT: 200 ms
TZON-0004SLOWVT: 30
TZON-0005SLOWVT: 6
TZON-0010SLOWVT: 80 ms
TZST-0001SLOWVT: 2
TZST-0001SLOWVT: 3
TZST-0001SLOWVT: 5
TZST-0003SLOWVT: 20 J
TZST-0003SLOWVT: 36 J

## 2013-07-25 NOTE — Addendum Note (Signed)
Addended by: Donalynn Furlong on: 07/25/2013 03:09 PM   Modules accepted: Level of Service

## 2013-07-26 ENCOUNTER — Encounter: Payer: Self-pay | Admitting: Cardiology

## 2013-07-29 DIAGNOSIS — J309 Allergic rhinitis, unspecified: Secondary | ICD-10-CM | POA: Diagnosis not present

## 2013-07-31 ENCOUNTER — Other Ambulatory Visit: Payer: Self-pay

## 2013-07-31 DIAGNOSIS — J329 Chronic sinusitis, unspecified: Secondary | ICD-10-CM | POA: Diagnosis not present

## 2013-07-31 DIAGNOSIS — M459 Ankylosing spondylitis of unspecified sites in spine: Secondary | ICD-10-CM | POA: Diagnosis not present

## 2013-07-31 DIAGNOSIS — M47812 Spondylosis without myelopathy or radiculopathy, cervical region: Secondary | ICD-10-CM | POA: Diagnosis not present

## 2013-08-08 ENCOUNTER — Encounter: Payer: Self-pay | Admitting: Internal Medicine

## 2013-08-14 DIAGNOSIS — J329 Chronic sinusitis, unspecified: Secondary | ICD-10-CM | POA: Diagnosis not present

## 2013-08-15 DIAGNOSIS — I10 Essential (primary) hypertension: Secondary | ICD-10-CM | POA: Diagnosis not present

## 2013-08-15 DIAGNOSIS — IMO0001 Reserved for inherently not codable concepts without codable children: Secondary | ICD-10-CM | POA: Diagnosis not present

## 2013-08-15 DIAGNOSIS — D509 Iron deficiency anemia, unspecified: Secondary | ICD-10-CM | POA: Diagnosis not present

## 2013-08-15 DIAGNOSIS — R51 Headache: Secondary | ICD-10-CM | POA: Diagnosis not present

## 2013-08-15 DIAGNOSIS — M542 Cervicalgia: Secondary | ICD-10-CM | POA: Diagnosis not present

## 2013-08-15 DIAGNOSIS — R42 Dizziness and giddiness: Secondary | ICD-10-CM | POA: Diagnosis not present

## 2013-08-15 DIAGNOSIS — I2510 Atherosclerotic heart disease of native coronary artery without angina pectoris: Secondary | ICD-10-CM | POA: Diagnosis not present

## 2013-08-26 DIAGNOSIS — J309 Allergic rhinitis, unspecified: Secondary | ICD-10-CM | POA: Diagnosis not present

## 2013-08-27 DIAGNOSIS — J329 Chronic sinusitis, unspecified: Secondary | ICD-10-CM | POA: Diagnosis not present

## 2013-08-28 ENCOUNTER — Ambulatory Visit (INDEPENDENT_AMBULATORY_CARE_PROVIDER_SITE_OTHER): Payer: Medicare Other | Admitting: Cardiology

## 2013-08-28 ENCOUNTER — Encounter: Payer: Self-pay | Admitting: Cardiology

## 2013-08-28 VITALS — BP 130/70 | HR 101 | Ht 60.0 in | Wt 116.0 lb

## 2013-08-28 DIAGNOSIS — Z95810 Presence of automatic (implantable) cardiac defibrillator: Secondary | ICD-10-CM

## 2013-08-28 DIAGNOSIS — I255 Ischemic cardiomyopathy: Secondary | ICD-10-CM

## 2013-08-28 DIAGNOSIS — I2589 Other forms of chronic ischemic heart disease: Secondary | ICD-10-CM | POA: Diagnosis not present

## 2013-08-28 DIAGNOSIS — Z8249 Family history of ischemic heart disease and other diseases of the circulatory system: Secondary | ICD-10-CM | POA: Diagnosis not present

## 2013-08-28 DIAGNOSIS — I454 Nonspecific intraventricular block: Secondary | ICD-10-CM

## 2013-08-28 DIAGNOSIS — I5022 Chronic systolic (congestive) heart failure: Secondary | ICD-10-CM

## 2013-08-28 DIAGNOSIS — I10 Essential (primary) hypertension: Secondary | ICD-10-CM | POA: Diagnosis not present

## 2013-08-28 DIAGNOSIS — R42 Dizziness and giddiness: Secondary | ICD-10-CM

## 2013-08-28 NOTE — Progress Notes (Signed)
[address] [place], [place]  [address] Phone: [phone] Fax:  [phone]  Date:  08/28/2013   ID:  [name], DOB [birth year], MRN [id number]  PCP:  Pearla Dubonnet, MD   History of Present Illness: [name] is a 79 y.o. female ischemic/nonischemic cardiomyopathy status post bypass surgery as well as mitral valve repair with biventricular defibrillator, [employer] with partial resolution of her ejection fraction from 10% up to 45-50% in 2013 here for followup.  Recent visit with Dr. Donette Larry reviewed. She was experiencing some dizziness which has thus resolve.  Not like she was going to pass out, but not dizzy. Had to sit in a chair. Like a wavy feeling. Really tired. After a few seconds passed today. Prior episode end of September and lasted longer. Challenging for her to completely describe. No frank syncope however. Earlier, she thought she may have just been dehydrated however this morning she is fairly certain that she was not. ICD interrogation was reassuring. No evidence of dangerous arrhythmias.  Dr. Danielle Dess saw her previously because of her cervical spine issues. She states that she's had slight shifting of C3 and C4. This can be quite painful for her.  08/28/13-today feels better. Less episodes of dizzy-like feeling. No chest pain.  Wt Readings from Last 3 Encounters:  08/28/13 116 lb (52.617 kg)  07/16/13 117 lb 3.2 oz (53.162 kg)  06/09/13 115 lb (52.164 kg)     Past Medical History  Diagnosis Date  . Ischemic cardiomyopathy     severe. Left ventricular ejection fraction 20%.   . Chronic systolic heart failure     NYHA class II.  [name] Chronic pulmonary disease   . BBB (bundle branch block)     s/p BiV ICD implant  . Raynaud's syndrome   . Neuromuscular scoliosis of thoracolumbar region     type of scoliosis was not specified.   [name] DJD (degenerative joint disease), cervical   . DJD (degenerative joint disease), lumbar   . HTN  (hypertension)   . Hyperthyroidism     following Graves disease  . Renal artery stenosis     Treated with angioplast in 1980 and 1987.   . S/P CABG (coronary artery bypass graft) April 2012  . FH: mitral valve repair     with 26 mm Edwards ring angioplasty,   . COPD (chronic obstructive pulmonary disease)     Past Surgical History  Procedure Laterality Date  . Total abdominal hysterectomy    . Sympathectomy    . Tonsillectomy    . Renal artery ballon dilation    . Rotator cuff repair      right and left  . Laminotomy/foraminotomy      with decompression of the L4 nerve root   . Cervical fusion      C5-6 and C6-7, C4-5 with titanium plates  . Umbilical hernia repair    . Wedge resection  2001    for the right upper lobe for Aspergillus treatement.  Dr. Edwyna Shell apprix 2001.  [name] Ptca      of bilateral renal arteries  . Cataract extraction    . Carpal tunnel release    . Appendectomy    . Breast lumpectomy      left breast  . Renal artery ballon dilation      x2  . Precancerous growth      tops of ear removed. bilateral.   . Coronary artery bypass  graft  2010  . Implantation of icd  2010    BiV ICD implant (SJM) by Dr Amil Amen 03/2009  . Repair extensor tendon  07/10/2012    Procedure: REPAIR EXTENSOR TENDON;  Surgeon: Nicki Reaper, MD;  Location: [facility];  Service: Orthopedics;  Laterality: Right;  METACARPAL PHALANGEAL REPLACEMENT ARTHROPLASTIES RIGHT INDEX, MIDDLE, AND RING FINGERS  CENTRALIZATION EXTENSOR TENDONS INDEX, MIDDLE,  AND RING FINGER   . Finger arthroplasty  07/10/2012    Procedure: FINGER ARTHROPLASTY;  Surgeon: Nicki Reaper, MD;  Location: [facility];  Service: Orthopedics;  Laterality: Right;  METACARPAL PHALANGEAL ARTHROPLASTIES RIGHT INDEX, MIDDLE, AND RING FINGERS *TWO BLUE LOOPS LEFT IN AS DRAINS*  . Esophageal manometry N/A 04/07/2013    Procedure: ESOPHAGEAL MANOMETRY (EM);  Surgeon: Charolett Bumpers, MD;  Location: [facility];  Service: Endoscopy;  Laterality: N/A;    Current Outpatient Prescriptions  Medication Sig Dispense Refill  . carvedilol (COREG) 12.5 MG tablet Take 12.5 mg by mouth 2 (two) times daily.        . Cholecalciferol (VITAMIN D3) 2000 UNITS capsule Take 2,000 Units by mouth daily.        [name] DIOVAN 80 MG tablet Take 0.5 tablets by mouth daily.      . fluconazole (DIFLUCAN) 100 MG tablet Take 100 mg by mouth once a week.        . levothyroxine (SYNTHROID, LEVOTHROID) 100 MCG tablet Take 100 mcg by mouth daily.        . montelukast (SINGULAIR) 10 MG tablet Take 10 mg by mouth daily.        . nortriptyline (PAMELOR) 25 MG capsule Take 15 mg by mouth daily.       . pantoprazole (PROTONIX) 40 MG tablet Take 40 mg by mouth daily.        No current facility-administered medications for this visit.    Allergies:    Allergies  Allergen Reactions  . Alprazolam     REACTION: ulcer's in mouth and extreme constipation  . Aspirin     REACTION: upsets stomach  . Calcitonin ([place])     REACTION: rash over entire body  . Cefuroxime Axetil     REACTION: either rash and diarrhea  . Cephalexin     REACTION: rash  . Ciprofloxacin     REACTION: rash  . Clonazepam     REACTION: 1/2 pill makes grogginess next day  . Doxycycline     REACTION: severe rash over entire body  . Erythromycin     REACTION: rash  . Hydrocodone     REACTION: nausea and totally out of it  . Hydrocodone-Acetaminophen     REACTION: reaction forgeotten  . Ketoconazole     REACTION: terribly weak, voice shook  . Oxycodone-Aspirin     REACTION: nausea  . Penicillins     REACTION: rash  . Sulfamethoxazole-Trimethoprim     REACTION: either rash or diarrhea  . Sulfonamide Derivatives     REACTION: reaction forgotten    Social History:  The patient  reports that she has never smoked. She does not have any smokeless tobacco history on file. She reports that she does not drink alcohol.   ROS:  Please see the history  of present illness.   Denies any fevers, chills, chest pain, orthopnea. She is not complaining currently of shortness of breath. Positive neck pain.   All other systems reviewed and negative.   PHYSICAL EXAM: VS:  BP 130/70  Pulse 101  Ht 5' (1.524 m)  Wt 116 lb (52.617 kg)  BMI 22.65 kg/m2 Thin, elderly, in no acute distress HEENT: normalCervical neck scar is noted Neck: no JVD Cardiac:  normal S1, S2; RRR; occasional ectopy, what sounds like brief PAT, no murmur Lungs:  clear to auscultation bilaterally, no wheezing, rhonchi or rales Abd: soft, nontender, no hepatomegaly Ext: no edema Skin: warm and dry Neuro: no focal abnormalities noted  EKG:  Defibrillator interrogated 07/16/13 . Previous interrogation in September2014 demonstrated 6 mode switches approximately 10 seconds duration. There did not seem to be any correlation with symptoms this morning/today and any mode switch or arrhythmia.  Prior EKG demonstrated biventricular pacing, occasional PAC/PVC  ASSESSMENT AND PLAN:  1. Dizziness-difficult to fully describe. No frank syncope. Question autonomic. Could her cervical spine/neck pain be playing a role? Most recent ejection fraction reassuring at 45-50%. Blood pressure currently on the upper/mildly elevated side. I do not wish to decrease her antihypertensives at this time. I would like to continue with carvedilol at current dosing also to help suppress PAT/PVCs. 2. Ischemic cardiomyopathy/nonischemic-reassuring improvement of ejection fraction from 10% up to 45-50%. 3. Prior mitral valve repair 4. Prior bypass, CABG 5. Bundle branch block-underlying, chronic. Currently biventricular paced 6. Biventricular pacemaker/ICD-functioning well. Dr. Johney Frame.   Signed, Donato Schultz, MD [facility]  08/28/2013 12:20 PM

## 2013-08-28 NOTE — Patient Instructions (Signed)
Your physician recommends that you continue on your current medications as directed. Please refer to the Current Medication list given to you today.  Your physician wants you to follow-up in: 6 months with Dr. Skains. You will receive a reminder letter in the mail two months in advance. If you don't receive a letter, please call our office to schedule the follow-up appointment.  

## 2013-08-29 DIAGNOSIS — M65839 Other synovitis and tenosynovitis, unspecified forearm: Secondary | ICD-10-CM | POA: Diagnosis not present

## 2013-08-29 DIAGNOSIS — M19049 Primary osteoarthritis, unspecified hand: Secondary | ICD-10-CM | POA: Diagnosis not present

## 2013-09-08 ENCOUNTER — Ambulatory Visit: Payer: Medicare Other | Admitting: *Deleted

## 2013-09-08 ENCOUNTER — Encounter: Payer: Self-pay | Admitting: Internal Medicine

## 2013-09-08 DIAGNOSIS — I428 Other cardiomyopathies: Secondary | ICD-10-CM | POA: Diagnosis not present

## 2013-09-10 DIAGNOSIS — J310 Chronic rhinitis: Secondary | ICD-10-CM | POA: Diagnosis not present

## 2013-09-10 DIAGNOSIS — J329 Chronic sinusitis, unspecified: Secondary | ICD-10-CM | POA: Diagnosis not present

## 2013-09-12 ENCOUNTER — Encounter: Payer: Self-pay | Admitting: *Deleted

## 2013-09-14 LAB — MDC_IDC_ENUM_SESS_TYPE_REMOTE
Battery Remaining Longevity: 40 mo
Battery Voltage: 2.57 V
Brady Statistic AP VP Percent: 39 %
Brady Statistic AS VP Percent: 57 %
Brady Statistic AS VS Percent: 2.3 %
Date Time Interrogation Session: 20141215081015
HighPow Impedance: 50 Ohm
Implantable Pulse Generator Serial Number: 702836
Lead Channel Impedance Value: 450 Ohm
Lead Channel Pacing Threshold Amplitude: 0.5 V
Lead Channel Pacing Threshold Amplitude: 1 V
Lead Channel Pacing Threshold Amplitude: 1 V
Lead Channel Pacing Threshold Pulse Width: 0.5 ms
Lead Channel Sensing Intrinsic Amplitude: 12 mV
Lead Channel Sensing Intrinsic Amplitude: 3.3 mV
Lead Channel Setting Pacing Amplitude: 2 V
Lead Channel Setting Pacing Pulse Width: 0.5 ms
Lead Channel Setting Sensing Sensitivity: 0.3 mV
Zone Setting Detection Interval: 400 ms

## 2013-09-22 DIAGNOSIS — M65839 Other synovitis and tenosynovitis, unspecified forearm: Secondary | ICD-10-CM | POA: Diagnosis not present

## 2013-09-22 DIAGNOSIS — M19049 Primary osteoarthritis, unspecified hand: Secondary | ICD-10-CM | POA: Diagnosis not present

## 2013-09-23 DIAGNOSIS — J309 Allergic rhinitis, unspecified: Secondary | ICD-10-CM | POA: Diagnosis not present

## 2013-09-24 ENCOUNTER — Telehealth: Payer: Self-pay | Admitting: Internal Medicine

## 2013-09-24 NOTE — Telephone Encounter (Signed)
New message     Pt needs to know if her transmission was submitted

## 2013-09-24 NOTE — Telephone Encounter (Signed)
LMOM stating transmission was received 09/08/13.

## 2013-09-30 ENCOUNTER — Other Ambulatory Visit: Payer: Self-pay

## 2013-09-30 DIAGNOSIS — J329 Chronic sinusitis, unspecified: Secondary | ICD-10-CM | POA: Diagnosis not present

## 2013-09-30 MED ORDER — CARVEDILOL 12.5 MG PO TABS: 12.5000 mg | ORAL_TABLET | Freq: Two times a day (BID) | ORAL | Status: DC

## 2013-10-02 ENCOUNTER — Encounter: Payer: Self-pay | Admitting: *Deleted

## 2013-10-14 DIAGNOSIS — J329 Chronic sinusitis, unspecified: Secondary | ICD-10-CM | POA: Diagnosis not present

## 2013-10-21 DIAGNOSIS — J309 Allergic rhinitis, unspecified: Secondary | ICD-10-CM | POA: Diagnosis not present

## 2013-10-29 DIAGNOSIS — J329 Chronic sinusitis, unspecified: Secondary | ICD-10-CM | POA: Diagnosis not present

## 2013-11-03 DIAGNOSIS — M19049 Primary osteoarthritis, unspecified hand: Secondary | ICD-10-CM | POA: Diagnosis not present

## 2013-11-05 ENCOUNTER — Other Ambulatory Visit: Payer: Self-pay | Admitting: Orthopedic Surgery

## 2013-11-06 ENCOUNTER — Encounter (HOSPITAL_BASED_OUTPATIENT_CLINIC_OR_DEPARTMENT_OTHER): Payer: Self-pay | Admitting: *Deleted

## 2013-11-06 NOTE — Progress Notes (Signed)
Bryan at [facility] paged to call to come day of surgery-arrival 945am

## 2013-11-06 NOTE — Progress Notes (Signed)
To come in for bmet-icd rep coming 945am dos

## 2013-11-06 NOTE — Progress Notes (Signed)
Talked with bryan from [facility]-he will be here 945am 11/12/13

## 2013-11-07 ENCOUNTER — Encounter (HOSPITAL_BASED_OUTPATIENT_CLINIC_OR_DEPARTMENT_OTHER)
Admission: RE | Admit: 2013-11-07 | Discharge: 2013-11-07 | Disposition: A | Discharge status: Home / Self Care | Payer: Medicare Other | Source: Ambulatory Visit | Attending: Orthopedic Surgery | Admitting: Orthopedic Surgery

## 2013-11-07 DIAGNOSIS — Z01812 Encounter for preprocedural laboratory examination: Secondary | ICD-10-CM | POA: Insufficient documentation

## 2013-11-07 LAB — BASIC METABOLIC PANEL
BUN: 25 mg/dL — ABNORMAL HIGH (ref 6–23)
CHLORIDE: 100 meq/L (ref 96–112)
CO2: 26 meq/L (ref 19–32)
Calcium: 9.3 mg/dL (ref 8.4–10.5)
Creatinine, Ser: 0.94 mg/dL (ref 0.50–1.10)
GFR calc Af Amer: 66 mL/min — ABNORMAL LOW (ref >90)
GFR calc non Af Amer: 57 mL/min — ABNORMAL LOW (ref >90)
Glucose, Bld: 94 mg/dL (ref 70–99)
Potassium: 4.8 mEq/L (ref 3.7–5.3)
Sodium: 138 mEq/L (ref 137–147)

## 2013-11-11 NOTE — Progress Notes (Signed)
Pt was here 10/13-cleared for surgery and reviewed with dr crews then-dr crews reviewed this visit-ok-brian from [facility] is coming for the surgery-per dr crews

## 2013-11-12 ENCOUNTER — Ambulatory Visit (HOSPITAL_BASED_OUTPATIENT_CLINIC_OR_DEPARTMENT_OTHER): Payer: Medicare Other | Admitting: Anesthesiology

## 2013-11-12 ENCOUNTER — Encounter (HOSPITAL_BASED_OUTPATIENT_CLINIC_OR_DEPARTMENT_OTHER)
Admission: RE | Disposition: A | Discharge status: Home / Self Care | Payer: Self-pay | Source: Ambulatory Visit | Attending: Orthopedic Surgery

## 2013-11-12 ENCOUNTER — Encounter (HOSPITAL_BASED_OUTPATIENT_CLINIC_OR_DEPARTMENT_OTHER): Payer: Self-pay | Admitting: Orthopedic Surgery

## 2013-11-12 ENCOUNTER — Ambulatory Visit (HOSPITAL_BASED_OUTPATIENT_CLINIC_OR_DEPARTMENT_OTHER)
Admission: RE | Admit: 2013-11-12 | Discharge: 2013-11-12 | Disposition: A | Discharge status: Home / Self Care | Payer: Medicare Other | Source: Ambulatory Visit | Attending: Orthopedic Surgery | Admitting: Orthopedic Surgery

## 2013-11-12 ENCOUNTER — Encounter (HOSPITAL_BASED_OUTPATIENT_CLINIC_OR_DEPARTMENT_OTHER): Payer: Medicare Other | Admitting: Anesthesiology

## 2013-11-12 DIAGNOSIS — M19049 Primary osteoarthritis, unspecified hand: Secondary | ICD-10-CM | POA: Diagnosis not present

## 2013-11-12 DIAGNOSIS — I10 Essential (primary) hypertension: Secondary | ICD-10-CM | POA: Insufficient documentation

## 2013-11-12 DIAGNOSIS — Z981 Arthrodesis status: Secondary | ICD-10-CM | POA: Diagnosis not present

## 2013-11-12 DIAGNOSIS — Z882 Allergy status to sulfonamides status: Secondary | ICD-10-CM | POA: Diagnosis not present

## 2013-11-12 DIAGNOSIS — Z881 Allergy status to other antibiotic agents status: Secondary | ICD-10-CM | POA: Diagnosis not present

## 2013-11-12 DIAGNOSIS — J4489 Other specified chronic obstructive pulmonary disease: Secondary | ICD-10-CM | POA: Insufficient documentation

## 2013-11-12 DIAGNOSIS — Z9889 Other specified postprocedural states: Secondary | ICD-10-CM | POA: Insufficient documentation

## 2013-11-12 DIAGNOSIS — M5030 Other cervical disc degeneration, unspecified cervical region: Secondary | ICD-10-CM | POA: Diagnosis not present

## 2013-11-12 DIAGNOSIS — Z95810 Presence of automatic (implantable) cardiac defibrillator: Secondary | ICD-10-CM | POA: Insufficient documentation

## 2013-11-12 DIAGNOSIS — M51379 Other intervertebral disc degeneration, lumbosacral region without mention of lumbar back pain or lower extremity pain: Secondary | ICD-10-CM | POA: Insufficient documentation

## 2013-11-12 DIAGNOSIS — M5137 Other intervertebral disc degeneration, lumbosacral region: Secondary | ICD-10-CM | POA: Diagnosis not present

## 2013-11-12 DIAGNOSIS — M4120 Other idiopathic scoliosis, site unspecified: Secondary | ICD-10-CM | POA: Insufficient documentation

## 2013-11-12 DIAGNOSIS — Z885 Allergy status to narcotic agent status: Secondary | ICD-10-CM | POA: Insufficient documentation

## 2013-11-12 DIAGNOSIS — Z96639 Presence of unspecified artificial wrist joint: Secondary | ICD-10-CM | POA: Insufficient documentation

## 2013-11-12 DIAGNOSIS — J449 Chronic obstructive pulmonary disease, unspecified: Secondary | ICD-10-CM | POA: Diagnosis not present

## 2013-11-12 DIAGNOSIS — I2589 Other forms of chronic ischemic heart disease: Secondary | ICD-10-CM | POA: Diagnosis not present

## 2013-11-12 DIAGNOSIS — E0590 Thyrotoxicosis, unspecified without thyrotoxic crisis or storm: Secondary | ICD-10-CM | POA: Diagnosis not present

## 2013-11-12 DIAGNOSIS — Z951 Presence of aortocoronary bypass graft: Secondary | ICD-10-CM | POA: Diagnosis not present

## 2013-11-12 DIAGNOSIS — Z9849 Cataract extraction status, unspecified eye: Secondary | ICD-10-CM | POA: Insufficient documentation

## 2013-11-12 DIAGNOSIS — Z9089 Acquired absence of other organs: Secondary | ICD-10-CM | POA: Insufficient documentation

## 2013-11-12 DIAGNOSIS — I5022 Chronic systolic (congestive) heart failure: Secondary | ICD-10-CM | POA: Insufficient documentation

## 2013-11-12 DIAGNOSIS — M25549 Pain in joints of unspecified hand: Secondary | ICD-10-CM | POA: Diagnosis not present

## 2013-11-12 DIAGNOSIS — G8918 Other acute postprocedural pain: Secondary | ICD-10-CM | POA: Diagnosis not present

## 2013-11-12 HISTORY — PX: CARPOMETACARPEL SUSPENSION PLASTY: SHX5005

## 2013-11-12 HISTORY — PX: TENDON TRANSFER: SHX6109

## 2013-11-12 HISTORY — DX: Presence of dental prosthetic device (complete) (partial): Z97.2

## 2013-11-12 HISTORY — DX: Complete loss of teeth, unspecified cause, unspecified class: K08.109

## 2013-11-12 LAB — POCT HEMOGLOBIN-HEMACUE: HEMOGLOBIN: 14.4 g/dL (ref 12.0–15.0)

## 2013-11-12 SURGERY — CARPOMETACARPEL (CMC) SUSPENSION PLASTY
Anesthesia: Monitor Anesthesia Care | Site: Thumb | Laterality: Right

## 2013-11-12 MED ORDER — MIDAZOLAM HCL 2 MG/2ML IJ SOLN: 1.0000 mg | INTRAMUSCULAR | Status: DC | PRN

## 2013-11-12 MED ORDER — MIDAZOLAM HCL 2 MG/2ML IJ SOLN
INTRAMUSCULAR | Status: AC
  Filled 2013-11-12: qty 2

## 2013-11-12 MED ORDER — LACTATED RINGERS IV SOLN
INTRAVENOUS | Status: DC
  Administered 2013-11-12 (×2): via INTRAVENOUS

## 2013-11-12 MED ORDER — HYDROMORPHONE HCL 2 MG PO TABS: 1.0000 mg | ORAL_TABLET | ORAL | Status: DC | PRN

## 2013-11-12 MED ORDER — CHLORHEXIDINE GLUCONATE 4 % EX LIQD: 60.0000 mL | Freq: Once | CUTANEOUS | Status: DC

## 2013-11-12 MED ORDER — VANCOMYCIN HCL 1000 MG IV SOLR
1000.0000 mg | INTRAVENOUS | Status: DC | PRN
  Administered 2013-11-12: 1000 mg via INTRAVENOUS

## 2013-11-12 MED ORDER — FENTANYL CITRATE 0.05 MG/ML IJ SOLN: 25.0000 ug | INTRAMUSCULAR | Status: DC | PRN

## 2013-11-12 MED ORDER — FENTANYL CITRATE 0.05 MG/ML IJ SOLN: 50.0000 ug | INTRAMUSCULAR | Status: DC | PRN

## 2013-11-12 MED ORDER — BUPIVACAINE-EPINEPHRINE PF 0.5-1:200000 % IJ SOLN
INTRAMUSCULAR | Status: DC | PRN
  Administered 2013-11-12: 30 mL via PERINEURAL

## 2013-11-12 MED ORDER — FENTANYL CITRATE 0.05 MG/ML IJ SOLN
INTRAMUSCULAR | Status: AC
  Filled 2013-11-12: qty 4

## 2013-11-12 MED ORDER — PROPOFOL 10 MG/ML IV BOLUS
INTRAVENOUS | Status: AC
  Filled 2013-11-12: qty 20

## 2013-11-12 MED ORDER — BUPIVACAINE HCL (PF) 0.5 % IJ SOLN
INTRAMUSCULAR | Status: DC | PRN
  Administered 2013-11-12: 8 mL

## 2013-11-12 MED ORDER — VANCOMYCIN HCL IN DEXTROSE 1-5 GM/200ML-% IV SOLN
INTRAVENOUS | Status: AC
  Filled 2013-11-12: qty 200

## 2013-11-12 MED ORDER — FENTANYL CITRATE 0.05 MG/ML IJ SOLN
INTRAMUSCULAR | Status: AC
  Filled 2013-11-12: qty 2

## 2013-11-12 SURGICAL SUPPLY — 92 items
BAG DECANTER FOR FLEXI CONT (MISCELLANEOUS) IMPLANT
BIT DRILL 7/64X5 DISP (BIT) ×3 IMPLANT
BIT DRILL JACOB END 9/64INX5IN (BIT) ×3 IMPLANT
BLADE ARTHRO LOK 4 BEAVER (BLADE) IMPLANT
BLADE ARTHRO LOK 4MM BEAVER (BLADE)
BLADE MINI RND TIP GREEN BEAV (BLADE) ×3 IMPLANT
BLADE SURG 15 STRL LF DISP TIS (BLADE) ×1 IMPLANT
BLADE SURG 15 STRL SS (BLADE) ×2
BNDG COHESIVE 3X5 TAN STRL LF (GAUZE/BANDAGES/DRESSINGS) ×3 IMPLANT
BNDG ESMARK 4X9 LF (GAUZE/BANDAGES/DRESSINGS) ×3 IMPLANT
BNDG GAUZE ELAST 4 BULKY (GAUZE/BANDAGES/DRESSINGS) ×3 IMPLANT
BUR EGG 3PK/BX (BURR) IMPLANT
CHLORAPREP W/TINT 26ML (MISCELLANEOUS) ×3 IMPLANT
CLOSURE WOUND 1/4X4 (GAUZE/BANDAGES/DRESSINGS)
CORDS BIPOLAR (ELECTRODE) ×3 IMPLANT
COTTONBALL LRG STERILE PKG (GAUZE/BANDAGES/DRESSINGS) IMPLANT
COVER MAYO STAND STRL (DRAPES) ×3 IMPLANT
COVER TABLE BACK 60X90 (DRAPES) ×3 IMPLANT
CUFF TOURNIQUET SINGLE 18IN (TOURNIQUET CUFF) ×3 IMPLANT
DECANTER SPIKE VIAL GLASS SM (MISCELLANEOUS) IMPLANT
DRAIN TLS ROUND 10FR (DRAIN) IMPLANT
DRAPE EXTREMITY T 121X128X90 (DRAPE) ×3 IMPLANT
DRAPE OEC MINIVIEW 54X84 (DRAPES) ×3 IMPLANT
DRAPE SURG 17X23 STRL (DRAPES) ×3 IMPLANT
DRSG KUZMA FLUFF (GAUZE/BANDAGES/DRESSINGS) IMPLANT
GAUZE SPONGE 4X4 16PLY XRAY LF (GAUZE/BANDAGES/DRESSINGS) IMPLANT
GAUZE XEROFORM 1X8 LF (GAUZE/BANDAGES/DRESSINGS) ×3 IMPLANT
GLOVE BIOGEL PI IND STRL 7.0 (GLOVE) ×1 IMPLANT
GLOVE BIOGEL PI IND STRL 8.5 (GLOVE) ×1 IMPLANT
GLOVE BIOGEL PI INDICATOR 7.0 (GLOVE) ×2
GLOVE BIOGEL PI INDICATOR 8.5 (GLOVE) ×2
GLOVE ECLIPSE 7.0 STRL STRAW (GLOVE) ×3 IMPLANT
GLOVE SURG ORTHO 8.0 STRL STRW (GLOVE) ×3 IMPLANT
GOWN STRL REUS W/ TWL LRG LVL3 (GOWN DISPOSABLE) ×1 IMPLANT
GOWN STRL REUS W/TWL LRG LVL3 (GOWN DISPOSABLE) ×2
GOWN STRL REUS W/TWL XL LVL3 (GOWN DISPOSABLE) ×3 IMPLANT
K-WIRE .035X4 (WIRE) IMPLANT
LOOP VESSEL MAXI BLUE (MISCELLANEOUS) IMPLANT
NDL SUT 6 .5 CRC .975X.05 MAYO (NEEDLE) IMPLANT
NEEDLE 27GAX1X1/2 (NEEDLE) IMPLANT
NEEDLE HYPO 22GX1.5 SAFETY (NEEDLE) ×3 IMPLANT
NEEDLE KEITH (NEEDLE) IMPLANT
NEEDLE MAYO TAPER (NEEDLE)
NS IRRIG 1000ML POUR BTL (IV SOLUTION) ×3 IMPLANT
PACK BASIN DAY SURGERY FS (CUSTOM PROCEDURE TRAY) ×3 IMPLANT
PAD CAST 3X4 CTTN HI CHSV (CAST SUPPLIES) ×1 IMPLANT
PADDING CAST ABS 3INX4YD NS (CAST SUPPLIES)
PADDING CAST ABS 4INX4YD NS (CAST SUPPLIES)
PADDING CAST ABS COTTON 3X4 (CAST SUPPLIES) IMPLANT
PADDING CAST ABS COTTON 4X4 ST (CAST SUPPLIES) IMPLANT
PADDING CAST COTTON 3X4 STRL (CAST SUPPLIES) ×2
RUBBERBAND STERILE (MISCELLANEOUS) IMPLANT
SLEEVE SCD COMPRESS KNEE MED (MISCELLANEOUS) ×3 IMPLANT
SPLINT PLASTER CAST XFAST 3X15 (CAST SUPPLIES) IMPLANT
SPLINT PLASTER XTRA FASTSET 3X (CAST SUPPLIES)
SPONGE GAUZE 4X4 12PLY (GAUZE/BANDAGES/DRESSINGS) ×3 IMPLANT
STOCKINETTE 4X48 STRL (DRAPES) ×3 IMPLANT
STRIP CLOSURE SKIN 1/4X4 (GAUZE/BANDAGES/DRESSINGS) IMPLANT
SUT CHROMIC 5 0 P 3 (SUTURE) IMPLANT
SUT ETHIBOND 2 OS 4 DA (SUTURE) IMPLANT
SUT ETHIBOND 3-0 V-5 (SUTURE) ×3 IMPLANT
SUT FIBERWIRE #2 38 T-5 BLUE (SUTURE)
SUT FIBERWIRE 2-0 18 17.9 3/8 (SUTURE)
SUT FIBERWIRE 4-0 18 DIAM BLUE (SUTURE) ×3
SUT FIBERWIRE 4-0 18 TAPR NDL (SUTURE)
SUT MERSILENE 2.0 SH NDLE (SUTURE) IMPLANT
SUT MERSILENE 3 0 FS 1 (SUTURE) IMPLANT
SUT MERSILENE 4 0 P 3 (SUTURE) IMPLANT
SUT POLY BUTTON 15MM (SUTURE) IMPLANT
SUT PROLENE 2 0 SH DA (SUTURE) IMPLANT
SUT SILK 2 0 FS (SUTURE) IMPLANT
SUT SILK 4 0 PS 2 (SUTURE) IMPLANT
SUT STEEL 3 0 (SUTURE) ×3 IMPLANT
SUT STEEL 4 0 (SUTURE) IMPLANT
SUT STEEL 4 0 V 26 (SUTURE) IMPLANT
SUT VIC AB 3-0 PS1 18 (SUTURE)
SUT VIC AB 3-0 PS1 18XBRD (SUTURE) IMPLANT
SUT VIC AB 4-0 P-3 18XBRD (SUTURE) IMPLANT
SUT VIC AB 4-0 P2 18 (SUTURE) IMPLANT
SUT VIC AB 4-0 P3 18 (SUTURE)
SUT VICRYL 4-0 PS2 18IN ABS (SUTURE) ×3 IMPLANT
SUT VICRYL RAPID 5 0 P 3 (SUTURE) IMPLANT
SUT VICRYL RAPIDE 4/0 PS 2 (SUTURE) ×3 IMPLANT
SUTURE FIBERWR #2 38 T-5 BLUE (SUTURE) IMPLANT
SUTURE FIBERWR 2-0 18 17.9 3/8 (SUTURE) IMPLANT
SUTURE FIBERWR 4-0 18 DIA BLUE (SUTURE) ×1 IMPLANT
SUTURE FIBERWR 4-0 18 TAPR NDL (SUTURE) IMPLANT
SYR BULB 3OZ (MISCELLANEOUS) ×3 IMPLANT
SYR CONTROL 10ML LL (SYRINGE) ×3 IMPLANT
TOWEL OR 17X24 6PK STRL BLUE (TOWEL DISPOSABLE) ×6 IMPLANT
TUBE FEEDING 5FR 15 INCH (TUBING) IMPLANT
UNDERPAD 30X30 INCONTINENT (UNDERPADS AND DIAPERS) ×3 IMPLANT

## 2013-11-12 NOTE — Anesthesia Preprocedure Evaluation (Signed)
Anesthesia Evaluation  Patient identified by MRN, date of birth, ID band Patient awake    Reviewed: Allergy & Precautions, H&P , NPO status , Patient's Chart, lab work & pertinent test results, reviewed documented beta blocker date and time   Airway Mallampati: II TM Distance: >3 FB Neck ROM: Full    Dental no notable dental hx. (+) Upper Dentures, Lower Dentures, Dental Advisory Given   Pulmonary COPD breath sounds clear to auscultation  Pulmonary exam normal       Cardiovascular On Medications and On Home Beta Blockers + Peripheral Vascular Disease + Cardiac Defibrillator Rhythm:Regular Rate:Normal     Neuro/Psych negative neurological ROS  negative psych ROS   GI/Hepatic Neg liver ROS, GERD-  Medicated and Controlled,  Endo/Other  Hyperthyroidism   Renal/GU negative Renal ROS  negative genitourinary   Musculoskeletal   Abdominal   Peds  Hematology negative hematology ROS (+)   Anesthesia Other Findings   Reproductive/Obstetrics negative OB ROS                           Anesthesia Physical Anesthesia Plan  ASA: III  Anesthesia Plan: MAC and Regional   Post-op Pain Management:    Induction: Intravenous  Airway Management Planned: Simple Face Mask  Additional Equipment:   Intra-op Plan:   Post-operative Plan:   Informed Consent: I have reviewed the patients History and Physical, chart, labs and discussed the procedure including the risks, benefits and alternatives for the proposed anesthesia with the patient or authorized representative who has indicated his/her understanding and acceptance.   Dental advisory given  Plan Discussed with: CRNA  Anesthesia Plan Comments:         Anesthesia Quick Evaluation

## 2013-11-12 NOTE — Transfer of Care (Signed)
Immediate Anesthesia Transfer of Care Note  Patient: [name]  Procedure(s) Performed: Procedure(s): SUSPENSION PLASTY RIGHT THUMB, TRAPEZIUM EXCISION (Right) RIGHT ABDUCTOR POLLICUS LONGUS TENDON TRANSFER (Right)  Patient Location: PACU  Anesthesia Type:Regional  Level of Consciousness: awake  Airway & Oxygen Therapy: Patient Spontanous Breathing and Patient connected to face mask oxygen  Post-op Assessment: Report given to PACU RN and Post -op Vital signs reviewed and stable  Post vital signs: Reviewed and stable  Complications: No apparent anesthesia complications

## 2013-11-12 NOTE — Brief Op Note (Signed)
11/12/2013  11:27 AM  PATIENT:  [name]  80 y.o. female  PRE-OPERATIVE DIAGNOSIS:  CARPOMETACARPAL ARTHRITIS RIGHT THUMB  POST-OPERATIVE DIAGNOSIS:  CARPOMETACARPAL ARTHRITIS RIGHT THUMB  PROCEDURE:  Procedure(s): SUSPENSION PLASTY RIGHT THUMB, TRAPEZIUM EXCISION (Right) RIGHT ABDUCTOR POLLICUS LONGUS TENDON TRANSFER (Right)  SURGEON:  Surgeon(s) and Role:    * Nicki Reaper, MD - Primary  PHYSICIAN ASSISTANT:   ASSISTANTS: none   ANESTHESIA:   regional  EBL:  Total I/O In: 600 [I.V.:600] Out: -   BLOOD ADMINISTERED:none  DRAINS: none   LOCAL MEDICATIONS USED:  NONE  SPECIMEN:  No Specimen  DISPOSITION OF SPECIMEN:  N/A  COUNTS:  YES  TOURNIQUET:   Total Tourniquet Time Documented: Upper Arm (Right) - 73 minutes Total: Upper Arm (Right) - 73 minutes   DICTATION: .Other Dictation: Dictation Number [id number]  PLAN OF CARE: Discharge to home after PACU  PATIENT DISPOSITION:  PACU - hemodynamically stable.

## 2013-11-12 NOTE — Progress Notes (Signed)
Pt did not want any pre med for block.  Tolerated well.  Family at bedside.

## 2013-11-12 NOTE — Op Note (Signed)
Dictation Number [id number]

## 2013-11-12 NOTE — Anesthesia Postprocedure Evaluation (Signed)
  Anesthesia Post-op Note  Patient: [name]  Procedure(s) Performed: Procedure(s): SUSPENSION PLASTY RIGHT THUMB, TRAPEZIUM EXCISION (Right) RIGHT ABDUCTOR POLLICUS LONGUS TENDON TRANSFER (Right)  Patient Location: PACU  Anesthesia Type:MAC and Regional   Level of Consciousness: awake, alert  and oriented  Airway and Oxygen Therapy: Patient Spontanous Breathing and Patient connected to face mask  Post-op Pain: none  Post-op Assessment: Post-op Vital signs reviewed, Patient's Cardiovascular Status Stable, Respiratory Function Stable and Patent Airway  Post-op Vital Signs: Reviewed and stable  Complications: No apparent anesthesia complications

## 2013-11-12 NOTE — Progress Notes (Signed)
Rep from [facility] here to check icd pacemaker. Dr Sampson Goon consulted with rep. CRNA consulted with rep.

## 2013-11-12 NOTE — Progress Notes (Signed)
Assisted Dr. Sampson Goon with right, ultrasound guided, supraclavicular, Side rails up, monitors on throughout procedure. See vital signs in flow sheet. Tolerated Procedure well.

## 2013-11-12 NOTE — Anesthesia Procedure Notes (Signed)
Anesthesia Regional Block:  Supraclavicular block  Pre-Anesthetic Checklist: ,, timeout performed, Correct Patient, Correct Site, Correct Laterality, Correct Procedure, Correct Position, site marked, Risks and benefits discussed, pre-op evaluation, post-op pain management  Laterality: Right  Prep: Maximum Sterile Barrier Precautions used and chloraprep       Needles:  Injection technique: Single-shot  Needle Type: Echogenic Stimulator Needle     Needle Length: 5cm 5 cm Needle Gauge: 22 and 22 G    Additional Needles:  Procedures: ultrasound guided (picture in chart) Supraclavicular block Narrative:  Start time: 11/12/2013 9:22 AM End time: 11/12/2013 9:33 AM Injection made incrementally with aspirations every 5 mL. Anesthesiologist: Chrysten Woulfe,MD  Additional Notes: 2% Lidocaine skin wheel. Intercostobrachial block with 8cc of 0.5% Bupivicaine plain.

## 2013-11-12 NOTE — Discharge Instructions (Addendum)
°  Post Anesthesia Home Care Instructions ° °Activity: °Get plenty of rest for the remainder of the day. A responsible adult should stay with you for 24 hours following the procedure.  °For the next 24 hours, DO NOT: °-Drive a car °-Operate machinery °-Drink alcoholic beverages °-Take any medication unless instructed by your physician °-Make any legal decisions or sign important papers. ° °Meals: °Start with liquid foods such as gelatin or soup. Progress to regular foods as tolerated. Avoid greasy, spicy, heavy foods. If nausea and/or vomiting occur, drink only clear liquids until the nausea and/or vomiting subsides. Call your physician if vomiting continues. ° °Special Instructions/Symptoms: °Your throat may feel dry or sore from the anesthesia or the breathing tube placed in your throat during surgery. If this causes discomfort, gargle with warm salt water. The discomfort should disappear within 24 hours. ° ° ° ° °Regional Anesthesia Blocks ° °1. Numbness or the inability to move the "blocked" extremity may last from 3-48 hours after placement. The length of time depends on the medication injected and your individual response to the medication. If the numbness is not going away after 48 hours, call your surgeon. ° °2. The extremity that is blocked will need to be protected until the numbness is gone and the  Strength has returned. Because you cannot feel it, you will need to take extra care to avoid injury. Because it may be weak, you may have difficulty moving it or using it. You may not know what position it is in without looking at it while the block is in effect. ° °3. For blocks in the legs and feet, returning to weight bearing and walking needs to be done carefully. You will need to wait until the numbness is entirely gone and the strength has returned. You should be able to move your leg and foot normally before you try and bear weight or walk. You will need someone to be with you when you first try to  ensure you do not fall and possibly risk injury. ° °4. Bruising and tenderness at the needle site are common side effects and will resolve in a few days. ° °5. Persistent numbness or new problems with movement should be communicated to the surgeon or the  Surgery Center ([phone])/  Surgery Center ([phone]). ° ° ° ° °Hand Center Instructions °Hand Surgery ° °Wound Care: °Keep your hand elevated above the level of your heart.  Do not allow it to dangle by your side.  Keep the dressing dry and do not remove it unless your doctor advises you to do so.  He will usually change it at the time of your post-op visit.  Moving your fingers is advised to stimulate circulation but will depend on the site of your surgery.  If you have a splint applied, your doctor will advise you regarding movement. ° °Activity: °Do not drive or operate machinery today.  Rest today and then you may return to your normal activity and work as indicated by your physician. ° °Diet:  °Drink liquids today or eat a light diet.  You may resume a regular diet tomorrow.   ° °General expectations: °Pain for two to three days. °Fingers may become slightly swollen. ° °Call your doctor if any of the following occur: °Severe pain not relieved by pain medication. °Elevated temperature. °Dressing soaked with blood. °Inability to move fingers. °White or bluish color to fingers. °

## 2013-11-12 NOTE — H&P (Signed)
[name] is a 80 year old right hand dominant female who has been treated for [facility] arthritis, pain at both MP and CMC joints of her right thumb. She has undergone a suspensionplasty on her left side along with replacement to the MCP joints on her right. She has had injections to both [facility] and MP joints for the past 6 months and has had continued pain. She desires proceeding to have surgical intervention to the [facility] joint of her right side.  PAST MEDICAL HISTORY: She is allergic to multiple antibiotics but can take Vancomycin, Gentamicin and Levaquin. She has had MCP joint replacements, suspensionplasty of her left side, MCP joint replacements on the right side to all fingers. She is on the following medications: Lasix, Synthroid, Fluconazole, Nortriptyline, omeprazole, Crestor, Singulair, Fosamax, Clonidine, magnesium, potassium, Viactiv, and vitamin D. She has the following allergies: sulfa drugs, aspirin, PCN, E-mycin, Amitriptyline, Bactrim, Ceftin, Cipro, Doxycycline, Fulvicin, Hydrocodone, Ketac sol, Klonopin, Lorcet, Nizoral, Percodan, Serevent, Tapazole, Trovan, Ventolin, Xanax, Keflex, Miacalcin Nasal spray, and Hydromorphone  FAMILY H ISTORY: Positive heart disease, high BP and arthritis.  SOCIAL HISTORY: She does not smoke or drink.   REVIEW OF SYSTEMS: Positive for glasses, high BP, asthma, TB, and arthritis, otherwise negative. [name] is an 80 y.o. female.   Chief Complaint: CMC arthritis rt thumb HPI: see above  Past Medical History  Diagnosis Date  . Ischemic cardiomyopathy     severe. Left ventricular ejection fraction 20%.   . Chronic systolic heart failure     NYHA class II.  [name] Chronic pulmonary disease   . BBB (bundle branch block)     s/p BiV ICD implant  . Raynaud's syndrome   . Neuromuscular scoliosis of thoracolumbar region     type of scoliosis was not specified.   [name] DJD (degenerative joint disease), cervical   . DJD (degenerative joint disease), lumbar   .  HTN (hypertension)   . Hyperthyroidism     following Graves disease  . Renal artery stenosis     Treated with angioplast in 1980 and 1987.   . S/P CABG (coronary artery bypass graft) April 2012  . FH: mitral valve repair     with 26 mm Edwards ring angioplasty,   . COPD (chronic obstructive pulmonary disease)   . Full dentures     Past Surgical History  Procedure Laterality Date  . Total abdominal hysterectomy    . Sympathectomy    . Tonsillectomy    . Renal artery ballon dilation    . Rotator cuff repair      right and left  . Laminotomy/foraminotomy      with decompression of the L4 nerve root   . Cervical fusion      C5-6 and C6-7, C4-5 with titanium plates  . Umbilical hernia repair    . Wedge resection  2001    for the right upper lobe for Aspergillus treatement.  Dr. Edwyna Shell apprix 2001.  [name] Ptca      of bilateral renal arteries  . Cataract extraction    . Carpal tunnel release    . Appendectomy    . Breast lumpectomy      left breast  . Renal artery ballon dilation      x2  . Precancerous growth      tops of ear removed. bilateral.   . Coronary artery bypass graft  2010  . Implantation of icd  2010    BiV ICD implant (SJM) by Dr Amil Amen 03/2009  .  Repair extensor tendon  07/10/2012    Procedure: REPAIR EXTENSOR TENDON;  Surgeon: Nicki ReaperGary R Vicie Cech, MD;  Location: [facility];  Service: Orthopedics;  Laterality: Right;  METACARPAL PHALANGEAL REPLACEMENT ARTHROPLASTIES RIGHT INDEX, MIDDLE, AND RING FINGERS  CENTRALIZATION EXTENSOR TENDONS INDEX, MIDDLE,  AND RING FINGER   . Finger arthroplasty  07/10/2012    Procedure: FINGER ARTHROPLASTY;  Surgeon: Nicki ReaperGary R Fiza Nation, MD;  Location: [facility];  Service: Orthopedics;  Laterality: Right;  METACARPAL PHALANGEAL ARTHROPLASTIES RIGHT INDEX, MIDDLE, AND RING FINGERS *TWO BLUE LOOPS LEFT IN AS DRAINS*  . Esophageal manometry N/A 04/07/2013    Procedure: ESOPHAGEAL MANOMETRY (EM);  Surgeon: Charolett BumpersMartin K  Johnson, MD;  Location: [facility];  Service: Endoscopy;  Laterality: N/A;    Family History  Problem Relation Age of Onset  . Heart disease Father   . Hypertension Father   . Colon cancer      grandmother   Social History:  reports that she has never smoked. She does not have any smokeless tobacco history on file. She reports that she does not drink alcohol. Her drug history is not on file.  Allergies:  Allergies  Allergen Reactions  . Alprazolam     REACTION: ulcer's in mouth and extreme constipation  . Aspirin     REACTION: upsets stomach  . Calcitonin ([place])     REACTION: rash over entire body  . Cefuroxime Axetil     REACTION: either rash and diarrhea  . Cephalexin     REACTION: rash  . Ciprofloxacin     REACTION: rash  . Clonazepam     REACTION: 1/2 pill makes grogginess next day  . Doxycycline     REACTION: severe rash over entire body  . Erythromycin     REACTION: rash  . Hydrocodone     REACTION: nausea and totally out of it  . Hydrocodone-Acetaminophen     REACTION: reaction forgeotten  . Ketoconazole     REACTION: terribly weak, voice shook  . Oxycodone-Aspirin     REACTION: nausea  . Penicillins     REACTION: rash  . Sulfamethoxazole-Trimethoprim     REACTION: either rash or diarrhea  . Sulfonamide Derivatives     REACTION: reaction forgotten    No prescriptions prior to admission    No results found for this or any previous visit (from the past 48 hour(s)).  No results found.   Pertinent items are noted in HPI.  Height 5' (1.524 m), weight 112 lb (50.803 kg).  General appearance: alert, cooperative and appears stated age Head: Normocephalic, without obvious abnormality Neck: no JVD Resp: clear to auscultation bilaterally Cardio: regular rate and rhythm, S1, S2 normal, no murmur, click, rub or gallop GI: soft, non-tender; bowel sounds normal; no masses,  no organomegaly Extremities: extremities normal, atraumatic, no cyanosis or  edema Pulses: 2+ and symmetric Skin: Skin color, texture, turgor normal. No rashes or lesions Neurologic: Grossly normal Incision/Wound: na  Assessment/Plan X-rays reveal Eaton stage III CMC arthritis.  Diagnosis: CMC arthritis right thumb.  We would recommend suspensionplasty due to the sub[facility] which [facility]. The pre, peri and post op course are discussed along with risks and complications.  There is no guarantee with surgery, possibility of infection, recurrence, injury to arteries, nerves and tendons, incomplete relief of symptoms and dystrophy.  This will be scheduled as an outpatient under regional anesthesia.  Shakiya Mcneary R 11/12/2013, 7:36 AM

## 2013-11-13 ENCOUNTER — Encounter (HOSPITAL_BASED_OUTPATIENT_CLINIC_OR_DEPARTMENT_OTHER): Payer: Self-pay | Admitting: Orthopedic Surgery

## 2013-11-13 NOTE — Op Note (Signed)
[name], [name]                 ACCOUNT NO.:  [id number]  MEDICAL RECORD NO.:  [id number]  LOCATION:                                 FACILITY:  PHYSICIAN:  Cindee Salt, M.D.            DATE OF BIRTH:  DATE OF PROCEDURE:  11/12/2013 DATE OF DISCHARGE:                              OPERATIVE REPORT   PREOPERATIVE DIAGNOSIS:  Carpometacarpal arthritis, right thumb.  POSTOPERATIVE DIAGNOSIS:  Carpometacarpal arthritis, right thumb.  OPERATION:  Suspensionplasty with excision of trapezium, abductor pollicis longus tendon transfer, right thumb.  SURGEON:  Cindee Salt, MD  ANESTHESIA:  Supraclavicular block.  ANESTHESIOLOGIST:  Zenon Mayo, MD.  HISTORY:  The patient is a 80 year old female with a history of bilateral CMC arthritis, arthritis of metacarpophalangeal joint.  She has undergone a suspensionplasty on her left thumb.  She has undergone CMC arthroplasties to the index, middle, ring, and small fingers of her right hand.  She is admitted now for suspensionplasty of her right thumb.  Pre, peri, and postoperative course are discussed along with risks and complications.  She is aware that there is no guarantee with the surgery; possibility of infection; recurrence of injury to arteries, nerves, tendons; incomplete relief of symptoms; dystrophy.  In the preoperative area, the patient was seen, the extremity marked by both the patient and surgeon.  Antibiotic given.  PROCEDURE IN DETAIL:  The patient was brought to the operating room where a supraclavicular block had been carried out in the preoperative area.  She was prepped using ChloraPrep in supine position, right arm free.  A 3-minute dry time was allowed.  Time-out taken, confirming the patient and procedure.  The limb was exsanguinated with an Esmarch bandage.  Tourniquet placed on the upper inner arm was inflated to 300 mmHg.  A curvilinear incision was made over the base of the thumb, carried out along the  abductor pollicis longus tendon, carried down through subcutaneous tissue.  Bleeders were electrocauterized with bipolar.  Dorsal sensory radial nerve branches were identified and protected.  The extensor pollicis brevis tendon was found to have ruptured.  It was tenodesed at the rupture site at the level of the [facility] joint.  An incision was made just dorsal to the abductor pollicis longus insertion at the base of the thumb metacarpal.  On opening the carpometacarpal joint, the articular surface was entirely denuded of cartilage both on the distal aspect of the trapezium and proximal aspect of the first metacarpal.  Dissection was carried proximally.  The radial artery was identified at the base of the STT joint trapezium.  An incision made after protecting the artery into the STT joint.  The periosteal elevator, sharp dissection were then used to elevate the periosteum off from the trapezium.  This was isolated from the trapezoid.  This was released both volarly and dorsally of the trapezium, was removed in toto.  The wound was copiously irrigated with saline.  At this point in time, a drill hole was then made at the base of the metacarpal from the dorsal radial to the volar ulnar aspect of the base of the first metacarpal.  This  was enlarged to 9/16th inch.  A dorsal half of the abductor pollicis longus tendon was then harvested. An incision was made at the musculotendinous junction.  This was separated distally to the level of its insertion at the base of the thumb metacarpal.  I transected at the musculotendinous junction, delivered to the base of the thumb metacarpal.  A drill hole was then placed through the base of the index metacarpal in a volar to dorsal, radial to ulnar direction.  This was enlarged.  The abductor pollicis longus tendon was then passed through each from a distal to proximal direction at the base of the thumb metacarpal and from a volar to dorsal direction in the  index metacarpal.  Small incision was made dorsally on the index metacarpal.  The transfer was done with monofilament wire.  A tunnel was then created around the base of the metacarpal in a subperiosteal manner proximal to the extensor of the wrist of the abductor pollicis longus tendon.  The graft was then transferred into the carpometacarpal area and sutured to itself at its entrance into the base of the index metacarpal.  This was done with 3-0 Ethibond suture in figure-of-eight manner.  The remainder of the abductor pollicis longus tendon was then passed again through the drill hole in the base of the thumb metacarpal in a proximal to distal direction, then brought around the remainder of the abductor pollicis longus and sutured to the abductor pollicis longus at the base of the metacarpal where the other suture was placed.  This was again done with 3-0 Ethibond suture.  The wound was copiously irrigated with saline.  The capsule was closed with figure-of-eight 4-0 Vicryl sutures, subcutaneous tissue with interrupted 4-0 Vicryl, and skin with interrupted 4-0 Vicryl Rapide at each of the incisions, one at the musculotendinous junction of the abductor pollicis longus, one over the index metacarpal and one where the major portion of the surgery was performed.  Fluoroscopy stress views AP Laterl and Oblique reveal no proximal migration of the thumb metacarpal.A sterile compressive dressing, dorsal palmar thumb spica splint applied.  On deflation of the tourniquet, all fingers immediately pinked.  She was taken to the recovery room for observation in satisfactory condition.  [facility] will be [facility] home [facility] [place].          ______________________________ Cindee SaltGary Alanmichael Barmore, M.D.     GK/MEDQ  D:  11/12/2013  T:  11/13/2013  Job:  [id number]

## 2013-11-17 DIAGNOSIS — L259 Unspecified contact dermatitis, unspecified cause: Secondary | ICD-10-CM | POA: Diagnosis not present

## 2013-11-17 DIAGNOSIS — R21 Rash and other nonspecific skin eruption: Secondary | ICD-10-CM | POA: Diagnosis not present

## 2013-11-17 DIAGNOSIS — M1710 Unilateral primary osteoarthritis, unspecified knee: Secondary | ICD-10-CM | POA: Diagnosis not present

## 2013-11-20 DIAGNOSIS — M19049 Primary osteoarthritis, unspecified hand: Secondary | ICD-10-CM | POA: Diagnosis not present

## 2013-11-21 DIAGNOSIS — J329 Chronic sinusitis, unspecified: Secondary | ICD-10-CM | POA: Diagnosis not present

## 2013-11-28 DIAGNOSIS — J329 Chronic sinusitis, unspecified: Secondary | ICD-10-CM | POA: Diagnosis not present

## 2013-12-05 DIAGNOSIS — J329 Chronic sinusitis, unspecified: Secondary | ICD-10-CM | POA: Diagnosis not present

## 2013-12-10 ENCOUNTER — Ambulatory Visit (INDEPENDENT_AMBULATORY_CARE_PROVIDER_SITE_OTHER): Payer: Medicare Other | Admitting: *Deleted

## 2013-12-10 ENCOUNTER — Encounter: Payer: Self-pay | Admitting: Internal Medicine

## 2013-12-10 DIAGNOSIS — I2589 Other forms of chronic ischemic heart disease: Secondary | ICD-10-CM

## 2013-12-10 DIAGNOSIS — I255 Ischemic cardiomyopathy: Secondary | ICD-10-CM

## 2013-12-10 LAB — MDC_IDC_ENUM_SESS_TYPE_REMOTE
Battery Voltage: 2.59 V
Brady Statistic AP VS Percent: 2.6 %
Brady Statistic RA Percent Paced: 41 %
HIGH POWER IMPEDANCE MEASURED VALUE: 50 Ohm
Implantable Pulse Generator Serial Number: 702836
Lead Channel Impedance Value: 630 Ohm
Lead Channel Pacing Threshold Amplitude: 0.75 V
Lead Channel Pacing Threshold Amplitude: 1.25 V
Lead Channel Pacing Threshold Amplitude: 1.5 V
Lead Channel Pacing Threshold Pulse Width: 0.5 ms
Lead Channel Sensing Intrinsic Amplitude: 10.8 mV
Lead Channel Setting Pacing Amplitude: 2.25 V
Lead Channel Setting Pacing Amplitude: 3 V
Lead Channel Setting Pacing Pulse Width: 0.5 ms
Lead Channel Setting Sensing Sensitivity: 0.3 mV
MDC IDC MSMT BATTERY REMAINING LONGEVITY: 32 mo
MDC IDC MSMT LEADCHNL LV PACING THRESHOLD PULSEWIDTH: 0.5 ms
MDC IDC MSMT LEADCHNL RA IMPEDANCE VALUE: 390 Ohm
MDC IDC MSMT LEADCHNL RA SENSING INTR AMPL: 3.1 mV
MDC IDC MSMT LEADCHNL RV IMPEDANCE VALUE: 410 Ohm
MDC IDC MSMT LEADCHNL RV PACING THRESHOLD PULSEWIDTH: 0.5 ms
MDC IDC SESS DTM: 20150318064341
MDC IDC SET LEADCHNL LV PACING PULSEWIDTH: 0.5 ms
MDC IDC SET LEADCHNL RA PACING AMPLITUDE: 2 V
MDC IDC STAT BRADY AP VP PERCENT: 40 %
MDC IDC STAT BRADY AS VP PERCENT: 55 %
MDC IDC STAT BRADY AS VS PERCENT: 2 %
Zone Setting Detection Interval: 300 ms
Zone Setting Detection Interval: 400 ms

## 2013-12-12 DIAGNOSIS — J329 Chronic sinusitis, unspecified: Secondary | ICD-10-CM | POA: Diagnosis not present

## 2013-12-19 DIAGNOSIS — M19049 Primary osteoarthritis, unspecified hand: Secondary | ICD-10-CM | POA: Diagnosis not present

## 2013-12-22 DIAGNOSIS — J329 Chronic sinusitis, unspecified: Secondary | ICD-10-CM | POA: Diagnosis not present

## 2013-12-22 NOTE — Progress Notes (Signed)
ICD remote 

## 2013-12-24 ENCOUNTER — Encounter: Payer: Self-pay | Admitting: *Deleted

## 2014-01-01 DIAGNOSIS — J329 Chronic sinusitis, unspecified: Secondary | ICD-10-CM | POA: Diagnosis not present

## 2014-01-01 DIAGNOSIS — G9601 Cranial cerebrospinal fluid leak, spontaneous: Secondary | ICD-10-CM | POA: Diagnosis not present

## 2014-01-01 DIAGNOSIS — J310 Chronic rhinitis: Secondary | ICD-10-CM | POA: Diagnosis not present

## 2014-01-02 ENCOUNTER — Other Ambulatory Visit: Payer: Self-pay | Admitting: Otolaryngology

## 2014-01-02 DIAGNOSIS — G9601 Cranial cerebrospinal fluid leak, spontaneous: Secondary | ICD-10-CM

## 2014-01-02 DIAGNOSIS — G960 Cerebrospinal fluid leak: Principal | ICD-10-CM

## 2014-01-07 ENCOUNTER — Ambulatory Visit
Admission: RE | Admit: 2014-01-07 | Discharge: 2014-01-07 | Disposition: A | Discharge status: Home / Self Care | Payer: Medicare Other | Source: Ambulatory Visit | Attending: Otolaryngology | Admitting: Otolaryngology

## 2014-01-07 DIAGNOSIS — G960 Cerebrospinal fluid leak: Principal | ICD-10-CM

## 2014-01-07 DIAGNOSIS — J3489 Other specified disorders of nose and nasal sinuses: Secondary | ICD-10-CM | POA: Diagnosis not present

## 2014-01-07 DIAGNOSIS — G9601 Cranial cerebrospinal fluid leak, spontaneous: Secondary | ICD-10-CM

## 2014-01-13 NOTE — Telephone Encounter (Signed)
error 

## 2014-01-15 DIAGNOSIS — J441 Chronic obstructive pulmonary disease with (acute) exacerbation: Secondary | ICD-10-CM | POA: Diagnosis not present

## 2014-01-21 DIAGNOSIS — J209 Acute bronchitis, unspecified: Secondary | ICD-10-CM | POA: Diagnosis not present

## 2014-02-05 DIAGNOSIS — J329 Chronic sinusitis, unspecified: Secondary | ICD-10-CM | POA: Diagnosis not present

## 2014-02-18 DIAGNOSIS — L821 Other seborrheic keratosis: Secondary | ICD-10-CM | POA: Diagnosis not present

## 2014-02-18 DIAGNOSIS — L820 Inflamed seborrheic keratosis: Secondary | ICD-10-CM | POA: Diagnosis not present

## 2014-02-18 DIAGNOSIS — L570 Actinic keratosis: Secondary | ICD-10-CM | POA: Diagnosis not present

## 2014-02-23 DIAGNOSIS — M19049 Primary osteoarthritis, unspecified hand: Secondary | ICD-10-CM | POA: Diagnosis not present

## 2014-02-27 ENCOUNTER — Other Ambulatory Visit: Payer: Self-pay | Admitting: Internal Medicine

## 2014-02-27 DIAGNOSIS — R413 Other amnesia: Secondary | ICD-10-CM

## 2014-02-27 DIAGNOSIS — I499 Cardiac arrhythmia, unspecified: Secondary | ICD-10-CM | POA: Diagnosis not present

## 2014-02-27 DIAGNOSIS — R5383 Other fatigue: Secondary | ICD-10-CM | POA: Diagnosis not present

## 2014-02-27 DIAGNOSIS — R5381 Other malaise: Secondary | ICD-10-CM | POA: Diagnosis not present

## 2014-02-27 DIAGNOSIS — F341 Dysthymic disorder: Secondary | ICD-10-CM | POA: Diagnosis not present

## 2014-03-04 ENCOUNTER — Ambulatory Visit
Admission: RE | Admit: 2014-03-04 | Discharge: 2014-03-04 | Disposition: A | Discharge status: Home / Self Care | Payer: Medicare Other | Source: Ambulatory Visit | Attending: Internal Medicine | Admitting: Internal Medicine

## 2014-03-04 DIAGNOSIS — R413 Other amnesia: Secondary | ICD-10-CM

## 2014-03-04 DIAGNOSIS — H538 Other visual disturbances: Secondary | ICD-10-CM | POA: Diagnosis not present

## 2014-03-04 DIAGNOSIS — J310 Chronic rhinitis: Secondary | ICD-10-CM | POA: Diagnosis not present

## 2014-03-04 DIAGNOSIS — J329 Chronic sinusitis, unspecified: Secondary | ICD-10-CM | POA: Diagnosis not present

## 2014-03-06 DIAGNOSIS — R413 Other amnesia: Secondary | ICD-10-CM | POA: Diagnosis not present

## 2014-03-06 DIAGNOSIS — E039 Hypothyroidism, unspecified: Secondary | ICD-10-CM | POA: Diagnosis not present

## 2014-03-06 DIAGNOSIS — I499 Cardiac arrhythmia, unspecified: Secondary | ICD-10-CM | POA: Diagnosis not present

## 2014-03-06 DIAGNOSIS — R5381 Other malaise: Secondary | ICD-10-CM | POA: Diagnosis not present

## 2014-03-06 DIAGNOSIS — R5383 Other fatigue: Secondary | ICD-10-CM | POA: Diagnosis not present

## 2014-03-06 DIAGNOSIS — F341 Dysthymic disorder: Secondary | ICD-10-CM | POA: Diagnosis not present

## 2014-03-09 DIAGNOSIS — M1710 Unilateral primary osteoarthritis, unspecified knee: Secondary | ICD-10-CM | POA: Diagnosis not present

## 2014-03-16 ENCOUNTER — Ambulatory Visit (INDEPENDENT_AMBULATORY_CARE_PROVIDER_SITE_OTHER): Payer: Medicare Other | Admitting: *Deleted

## 2014-03-16 ENCOUNTER — Encounter: Payer: Self-pay | Admitting: Internal Medicine

## 2014-03-16 DIAGNOSIS — I255 Ischemic cardiomyopathy: Secondary | ICD-10-CM

## 2014-03-16 DIAGNOSIS — I5022 Chronic systolic (congestive) heart failure: Secondary | ICD-10-CM | POA: Diagnosis not present

## 2014-03-16 DIAGNOSIS — I2589 Other forms of chronic ischemic heart disease: Secondary | ICD-10-CM | POA: Diagnosis not present

## 2014-03-16 NOTE — Progress Notes (Signed)
Remote ICD transmission.   

## 2014-03-23 LAB — MDC_IDC_ENUM_SESS_TYPE_REMOTE
Battery Remaining Longevity: 17 mo
Battery Voltage: 2.56 V
Brady Statistic AP VP Percent: 38 %
Brady Statistic AS VP Percent: 57 %
Date Time Interrogation Session: 20150622071809
HIGH POWER IMPEDANCE MEASURED VALUE: 44 Ohm
Implantable Pulse Generator Serial Number: 702836
Lead Channel Impedance Value: 390 Ohm
Lead Channel Pacing Threshold Amplitude: 0.75 V
Lead Channel Pacing Threshold Pulse Width: 0.5 ms
Lead Channel Pacing Threshold Pulse Width: 0.5 ms
Lead Channel Sensing Intrinsic Amplitude: 2.8 mV
Lead Channel Setting Pacing Amplitude: 3 V
Lead Channel Setting Sensing Sensitivity: 0.3 mV
MDC IDC MSMT LEADCHNL LV IMPEDANCE VALUE: 610 Ohm
MDC IDC MSMT LEADCHNL LV PACING THRESHOLD AMPLITUDE: 1.25 V
MDC IDC MSMT LEADCHNL LV PACING THRESHOLD PULSEWIDTH: 0.5 ms
MDC IDC MSMT LEADCHNL RA IMPEDANCE VALUE: 400 Ohm
MDC IDC MSMT LEADCHNL RV PACING THRESHOLD AMPLITUDE: 1.5 V
MDC IDC MSMT LEADCHNL RV SENSING INTR AMPL: 11.8 mV
MDC IDC SET LEADCHNL LV PACING AMPLITUDE: 2.25 V
MDC IDC SET LEADCHNL LV PACING PULSEWIDTH: 0.5 ms
MDC IDC SET LEADCHNL RA PACING AMPLITUDE: 2 V
MDC IDC SET LEADCHNL RV PACING PULSEWIDTH: 0.5 ms
MDC IDC STAT BRADY AP VS PERCENT: 2.8 %
MDC IDC STAT BRADY AS VS PERCENT: 2.1 %
MDC IDC STAT BRADY RA PERCENT PACED: 39 %
Zone Setting Detection Interval: 300 ms
Zone Setting Detection Interval: 400 ms

## 2014-03-25 ENCOUNTER — Encounter: Payer: Self-pay | Admitting: Cardiology

## 2014-03-26 ENCOUNTER — Other Ambulatory Visit: Payer: Self-pay

## 2014-03-26 MED ORDER — CARVEDILOL 12.5 MG PO TABS: 12.5000 mg | ORAL_TABLET | Freq: Two times a day (BID) | ORAL | Status: DC

## 2014-03-30 ENCOUNTER — Encounter: Payer: Self-pay | Admitting: Cardiology

## 2014-04-01 ENCOUNTER — Ambulatory Visit (INDEPENDENT_AMBULATORY_CARE_PROVIDER_SITE_OTHER): Payer: Medicare Other | Admitting: Cardiology

## 2014-04-01 ENCOUNTER — Encounter: Payer: Self-pay | Admitting: Cardiology

## 2014-04-01 VITALS — BP 120/80 | HR 72 | Ht 60.0 in | Wt 114.8 lb

## 2014-04-01 DIAGNOSIS — I2589 Other forms of chronic ischemic heart disease: Secondary | ICD-10-CM

## 2014-04-01 DIAGNOSIS — Z8249 Family history of ischemic heart disease and other diseases of the circulatory system: Secondary | ICD-10-CM

## 2014-04-01 DIAGNOSIS — E785 Hyperlipidemia, unspecified: Secondary | ICD-10-CM | POA: Diagnosis not present

## 2014-04-01 DIAGNOSIS — I10 Essential (primary) hypertension: Secondary | ICD-10-CM

## 2014-04-01 DIAGNOSIS — I255 Ischemic cardiomyopathy: Secondary | ICD-10-CM

## 2014-04-01 DIAGNOSIS — Z951 Presence of aortocoronary bypass graft: Secondary | ICD-10-CM

## 2014-04-01 MED ORDER — CARVEDILOL 12.5 MG PO TABS: 12.5000 mg | ORAL_TABLET | Freq: Two times a day (BID) | ORAL | Status: AC

## 2014-04-01 NOTE — Patient Instructions (Signed)
The current medical regimen is effective;  continue present plan and medications.  Follow up in 6 months with Dr. Skains.  You will receive a letter in the mail 2 months before you are due.  Please call us when you receive this letter to schedule your follow up appointment.  

## 2014-04-01 NOTE — Progress Notes (Signed)
[address], [address] [place], [place]  [address] Phone: [phone] Fax:  [phone]  Date:  04/01/2014   ID:  [name], DOB 04-06-1933, MRN [id number]  PCP:  Pearla Dubonnet, MD   History of Present Illness: [name] is a 80 y.o. female ischemic/nonischemic cardiomyopathy status post bypass surgery as well as mitral valve repair with biventricular defibrillator, St. Jude with partial resolution of her ejection fraction from 10% up to 45-50% in 2013 here for followup.  Recent visit with Dr. Donette Larry reviewed. She was experiencing some dizziness which has thus resolve.  Not like she was going to pass out, but not dizzy. Had to sit in a chair. Like a wavy feeling. Really tired. After a few seconds passed today. Prior episode end of September and lasted longer. Challenging for her to completely describe. No frank syncope however. Earlier, she thought she may have just been dehydrated however this morning she is fairly certain that she was not. ICD interrogation was reassuring. No evidence of dangerous arrhythmias.  Dr. Danielle Dess saw her previously because of her cervical spine issues. She states that she's had slight shifting of C3 and C4. This can be quite painful for her.  08/28/13-today feels better. Less episodes of dizzy-like feeling. No chest pain.  04/01/14-doing well, mild dizziness at times when getting up but this is transient. Mild shortness of breath with exertion which is long-standing. She was worried about remote ICD transmissions. She was also wondering about her refill on her prescription for carvedilol.  Wt Readings from Last 3 Encounters:  04/01/14 114 lb 12.8 oz (52.073 kg)  11/12/13 116 lb (52.617 kg)  11/12/13 116 lb (52.617 kg)     Past Medical History  Diagnosis Date  . Ischemic cardiomyopathy     severe. Left ventricular ejection fraction 20%.   . Chronic systolic heart failure     NYHA class II.  [name] Chronic pulmonary disease   . BBB  (bundle branch block)     s/p BiV ICD implant  . Raynaud's syndrome   . Neuromuscular scoliosis of thoracolumbar region     type of scoliosis was not specified.   [name] DJD (degenerative joint disease), cervical   . DJD (degenerative joint disease), lumbar   . HTN (hypertension)   . Hyperthyroidism     following Graves disease  . Renal artery stenosis     Treated with angioplast in 1980 and 1987.   . S/P CABG (coronary artery bypass graft) April 2012  . FH: mitral valve repair     with 26 mm Edwards ring angioplasty,   . COPD (chronic obstructive pulmonary disease)   . Full dentures     Past Surgical History  Procedure Laterality Date  . Total abdominal hysterectomy    . Sympathectomy    . Tonsillectomy    . Renal artery ballon dilation    . Rotator cuff repair      right and left  . Laminotomy/foraminotomy      with decompression of the L4 nerve root   . Cervical fusion      C5-6 and C6-7, C4-5 with titanium plates  . Umbilical hernia repair    . Wedge resection  2001    for the right upper lobe for Aspergillus treatement.  Dr. Edwyna Shell apprix 2001.  [name] Ptca      of bilateral renal arteries  . Cataract extraction    . Carpal tunnel release    .  Appendectomy    . Breast lumpectomy      left breast  . Renal artery ballon dilation      x2  . Precancerous growth      tops of ear removed. bilateral.   . Coronary artery bypass graft  2010  . Implantation of icd  2010    BiV ICD implant (SJM) by Dr Amil AmenEdmunds 03/2009  . Repair extensor tendon  07/10/2012    Procedure: REPAIR EXTENSOR TENDON;  Surgeon: Nicki ReaperGary R Kuzma, MD;  Location: [facility];  Service: Orthopedics;  Laterality: Right;  METACARPAL PHALANGEAL REPLACEMENT ARTHROPLASTIES RIGHT INDEX, MIDDLE, AND RING FINGERS  CENTRALIZATION EXTENSOR TENDONS INDEX, MIDDLE,  AND RING FINGER   . Finger arthroplasty  07/10/2012    Procedure: FINGER ARTHROPLASTY;  Surgeon: Nicki ReaperGary R Kuzma, MD;  Location: [facility];  Service: Orthopedics;  Laterality: Right;  METACARPAL PHALANGEAL ARTHROPLASTIES RIGHT INDEX, MIDDLE, AND RING FINGERS *TWO BLUE LOOPS LEFT IN AS DRAINS*  . Esophageal manometry N/A 04/07/2013    Procedure: ESOPHAGEAL MANOMETRY (EM);  Surgeon: Charolett BumpersMartin K Johnson, MD;  Location: [facility];  Service: Endoscopy;  Laterality: N/A;  . Carpometacarpel suspension plasty Right 11/12/2013    Procedure: SUSPENSION PLASTY RIGHT THUMB, TRAPEZIUM EXCISION;  Surgeon: Nicki ReaperGary R Kuzma, MD;  Location: [facility];  Service: Orthopedics;  Laterality: Right;  . Tendon transfer Right 11/12/2013    Procedure: RIGHT ABDUCTOR POLLICUS LONGUS TENDON TRANSFER;  Surgeon: Nicki ReaperGary R Kuzma, MD;  Location: [facility];  Service: Orthopedics;  Laterality: Right;    Current Outpatient Prescriptions  Medication Sig Dispense Refill  . carvedilol (COREG) 12.5 MG tablet Take 1 tablet (12.5 mg total) by mouth 2 (two) times daily.  180 tablet  1  . Cholecalciferol (VITAMIN D3) 2000 UNITS capsule Take 2,000 Units by mouth daily.        [name]. DIOVAN 80 MG tablet Take 40 tablets by mouth daily.       . ferrous sulfate 325 (65 FE) MG tablet Take 325 mg by mouth daily with breakfast.      . fluconazole (DIFLUCAN) 150 MG tablet       . HYDROmorphone (DILAUDID) 2 MG tablet Take 0.5 tablets (1 mg total) by mouth every 4 (four) hours as needed for severe pain.  30 tablet  0  . levothyroxine (SYNTHROID, LEVOTHROID) 100 MCG tablet Take 75 mcg by mouth daily.       . montelukast (SINGULAIR) 10 MG tablet Take 10 mg by mouth daily.       . nortriptyline (PAMELOR) 25 MG capsule Take 25 mg by mouth at bedtime.       . pantoprazole (PROTONIX) 40 MG tablet Take 40 mg by mouth daily.        No current facility-administered medications for this visit.    Allergies:    Allergies  Allergen Reactions  . Alprazolam     REACTION: ulcer's in mouth and extreme constipation  . Aspirin     REACTION: upsets stomach  .  Calcitonin ([place])     REACTION: rash over entire body  . Cefuroxime Axetil     REACTION: either rash and diarrhea  . Cephalexin     REACTION: rash  . Ciprofloxacin     REACTION: rash  . Clonazepam     REACTION: 1/2 pill makes grogginess next day  . Doxycycline     REACTION: severe rash over entire body  . Erythromycin     REACTION: rash  .  Hydrocodone     REACTION: nausea and totally out of it  . Hydrocodone-Acetaminophen     REACTION: reaction forgeotten  . Ketoconazole     REACTION: terribly weak, voice shook  . Oxycodone-Aspirin     REACTION: nausea  . Penicillins     REACTION: rash  . Sulfamethoxazole-Trimethoprim     REACTION: either rash or diarrhea  . Sulfonamide Derivatives     REACTION: reaction forgotten    Social History:  The patient  reports that she has never smoked. She does not have any smokeless tobacco history on file. She reports that she does not drink alcohol.   ROS:  Please see the history of present illness.   Denies any fevers, chills, chest pain, orthopnea. She is not complaining currently of shortness of breath. Positive neck pain.   All other systems reviewed and negative.   PHYSICAL EXAM: VS:  BP 120/80  Pulse 72  Ht 5' (1.524 m)  Wt 114 lb 12.8 oz (52.073 kg)  BMI 22.42 kg/m2 Thin, elderly, in no acute distress HEENT: normalCervical neck scar is noted Neck: no JVD Cardiac:  normal S1, S2; RRR; occasional ectopy, what sounds like brief PAT, no murmur Lungs:  clear to auscultation bilaterally, no wheezing, rhonchi or rales Abd: soft, nontender, no hepatomegaly Ext: no edema Skin: warm and dry Neuro: no focal abnormalities noted  EKG:  Defibrillator interrogated 07/16/13 . Previous interrogation in September2014 demonstrated 6 mode switches approximately 10 seconds duration. There did not seem to be any correlation with symptoms this morning/today and any mode switch or arrhythmia.  Prior EKG demonstrated biventricular pacing, occasional  PAC/PVC  ASSESSMENT AND PLAN:  1. Cardiomyopathy- Most recent ejection fraction reassuring at 45-50%. Blood pressure currently on the upper/mildly elevated side. I do not wish to decrease her antihypertensives at this time. I would like to continue with carvedilol at current dosing also to help suppress PAT/PVCs. 2. Ischemic cardiomyopathy/nonischemic-reassuring improvement of ejection fraction from 10% up to 45-50%. 3. Prior mitral valve repair 4. Prior bypass, CABG 5. Bundle branch block-underlying, chronic. Currently biventricular paced 6. Biventricular pacemaker/ICD-functioning well. Dr. Johney Frame. 7. 6 month follow   Signed, Donato Schultz, MD [facility]  04/01/2014 10:35 AM

## 2014-04-06 DIAGNOSIS — M1710 Unilateral primary osteoarthritis, unspecified knee: Secondary | ICD-10-CM | POA: Diagnosis not present

## 2014-04-07 ENCOUNTER — Other Ambulatory Visit (HOSPITAL_COMMUNITY): Payer: Self-pay | Admitting: Orthopedic Surgery

## 2014-04-10 DIAGNOSIS — M1710 Unilateral primary osteoarthritis, unspecified knee: Secondary | ICD-10-CM | POA: Diagnosis not present

## 2014-04-17 ENCOUNTER — Encounter (HOSPITAL_COMMUNITY): Payer: Self-pay | Admitting: Pharmacy Technician

## 2014-04-17 DIAGNOSIS — I499 Cardiac arrhythmia, unspecified: Secondary | ICD-10-CM | POA: Diagnosis not present

## 2014-04-17 DIAGNOSIS — R5381 Other malaise: Secondary | ICD-10-CM | POA: Diagnosis not present

## 2014-04-17 DIAGNOSIS — R5383 Other fatigue: Secondary | ICD-10-CM | POA: Diagnosis not present

## 2014-04-17 DIAGNOSIS — R413 Other amnesia: Secondary | ICD-10-CM | POA: Diagnosis not present

## 2014-04-17 DIAGNOSIS — E039 Hypothyroidism, unspecified: Secondary | ICD-10-CM | POA: Diagnosis not present

## 2014-04-17 DIAGNOSIS — F341 Dysthymic disorder: Secondary | ICD-10-CM | POA: Diagnosis not present

## 2014-04-21 ENCOUNTER — Other Ambulatory Visit (HOSPITAL_COMMUNITY): Payer: Medicare Other

## 2014-04-22 ENCOUNTER — Ambulatory Visit (HOSPITAL_COMMUNITY)
Admission: RE | Admit: 2014-04-22 | Discharge: 2014-04-22 | Disposition: A | Discharge status: Home / Self Care | Payer: Medicare Other | Source: Ambulatory Visit | Attending: Anesthesiology | Admitting: Anesthesiology

## 2014-04-22 ENCOUNTER — Encounter (HOSPITAL_COMMUNITY)
Admission: RE | Admit: 2014-04-22 | Discharge: 2014-04-22 | Disposition: A | Discharge status: Home / Self Care | Payer: Medicare Other | Source: Ambulatory Visit | Attending: Orthopedic Surgery | Admitting: Orthopedic Surgery

## 2014-04-22 ENCOUNTER — Encounter (HOSPITAL_COMMUNITY): Payer: Self-pay

## 2014-04-22 DIAGNOSIS — Z01818 Encounter for other preprocedural examination: Secondary | ICD-10-CM | POA: Diagnosis not present

## 2014-04-22 HISTORY — DX: Anemia, unspecified: D64.9

## 2014-04-22 HISTORY — DX: Unspecified asthma, uncomplicated: J45.909

## 2014-04-22 HISTORY — DX: Gastro-esophageal reflux disease without esophagitis: K21.9

## 2014-04-22 LAB — URINE MICROSCOPIC-ADD ON

## 2014-04-22 LAB — COMPREHENSIVE METABOLIC PANEL
ALBUMIN: 4 g/dL (ref 3.5–5.2)
ALT: 13 U/L (ref 0–35)
AST: 20 U/L (ref 0–37)
Alkaline Phosphatase: 82 U/L (ref 39–117)
Anion gap: 12 (ref 5–15)
BUN: 22 mg/dL (ref 6–23)
CALCIUM: 9.8 mg/dL (ref 8.4–10.5)
CO2: 28 mEq/L (ref 19–32)
Chloride: 95 mEq/L — ABNORMAL LOW (ref 96–112)
Creatinine, Ser: 1.05 mg/dL (ref 0.50–1.10)
GFR calc non Af Amer: 50 mL/min — ABNORMAL LOW (ref >90)
GFR, EST AFRICAN AMERICAN: 57 mL/min — AB (ref >90)
Glucose, Bld: 91 mg/dL (ref 70–99)
Potassium: 4.6 mEq/L (ref 3.7–5.3)
Sodium: 135 mEq/L — ABNORMAL LOW (ref 137–147)
TOTAL PROTEIN: 7.7 g/dL (ref 6.0–8.3)
Total Bilirubin: 0.7 mg/dL (ref 0.3–1.2)

## 2014-04-22 LAB — TYPE AND SCREEN
ABO/RH(D): A POS
Antibody Screen: NEGATIVE

## 2014-04-22 LAB — SURGICAL PCR SCREEN
MRSA, PCR: POSITIVE — AB
STAPHYLOCOCCUS AUREUS: POSITIVE — AB

## 2014-04-22 LAB — URINALYSIS, ROUTINE W REFLEX MICROSCOPIC
Bilirubin Urine: NEGATIVE
Glucose, UA: NEGATIVE mg/dL
Hgb urine dipstick: NEGATIVE
KETONES UR: NEGATIVE mg/dL
Nitrite: NEGATIVE
PH: 7 (ref 5.0–8.0)
Protein, ur: NEGATIVE mg/dL
SPECIFIC GRAVITY, URINE: 1.012 (ref 1.005–1.030)
Urobilinogen, UA: 0.2 mg/dL (ref 0.0–1.0)

## 2014-04-22 LAB — CBC
HCT: 44.2 % (ref 36.0–46.0)
Hemoglobin: 14.4 g/dL (ref 12.0–15.0)
MCH: 28.9 pg (ref 26.0–34.0)
MCHC: 32.6 g/dL (ref 30.0–36.0)
MCV: 88.6 fL (ref 78.0–100.0)
PLATELETS: 200 10*3/uL (ref 150–400)
RBC: 4.99 MIL/uL (ref 3.87–5.11)
RDW: 14.5 % (ref 11.5–15.5)
WBC: 4.9 10*3/uL (ref 4.0–10.5)

## 2014-04-22 LAB — PROTIME-INR
INR: 1 (ref 0.00–1.49)
Prothrombin Time: 13.2 seconds (ref 11.6–15.2)

## 2014-04-22 LAB — APTT: aPTT: 30 seconds (ref 24–37)

## 2014-04-22 NOTE — Progress Notes (Signed)
Primary - dr. Kevan Ny Cardiologist - dr. Anne Fu - saw him before surgery was schedule. He does not know shes having surgery/  Last ekg 05-2013; last echo 2010.

## 2014-04-22 NOTE — Pre-Procedure Instructions (Signed)
[name]  04/22/2014   Your procedure is scheduled on:  04-29-2014  Wednesday   Report to [facility] Admitting at 6:30 AM.   Call this number if you have problems the morning of surgery: [phone]   Remember:   Do not eat food or drink liquids after midnight   Take these medicines the morning of surgery with A SIP OF WATER: Carvedilol(coreg),levothyroxine(Synthroid),Singular,protonix   Do not wear jewelry, make-up or nail polish.  Do not wear lotions, powders, or perfumes. You may not wear deodorant.  Do not shave 48 hours prior to surgery. Men may shave face and neck.  Do not bring valuables to the hospital.  [facility] is not responsible  for any belongings or valuables.               Contacts, dentures or bridgework may not be worn into surgery.   Leave suitcase in the car. After surgery it may be brought to your room.   For patients admitted to the hospital, discharge time is determined by you treatment team.                   Special Instructions: See attached Sheet for instructions on CHG shower/bath     Please read over the following fact sheets that you were given: Pain Booklet, Coughing and Deep Breathing, Blood Transfusion Information and Surgical Site Infection Prevention

## 2014-04-22 NOTE — Progress Notes (Signed)
Peri-Operative Implanted Device orders faxed to [facility].

## 2014-04-22 NOTE — Progress Notes (Signed)
Prescription called in for mupirocin ointment to [facility] per patient request. Patient did not answer phone call and no voicemail available - will try again later

## 2014-04-23 ENCOUNTER — Telehealth: Payer: Self-pay | Admitting: Cardiology

## 2014-04-23 NOTE — Telephone Encounter (Signed)
Pt is scheduled to have knee replacement this coming Wednesday August 5 th. Pt would like to be clear for surgery per Dr. Anne Fu. Pt was seen in MD's clinic on 04/01/14. Pt is aware that this message will be send to Md for recommendations.

## 2014-04-23 NOTE — Telephone Encounter (Signed)
New message      Having knee replacement on Wednesday----will she need to be cleared by Dr Anne Fu?

## 2014-04-23 NOTE — Progress Notes (Addendum)
Anesthesia Chart Review:  Patient is a 80 year old female scheduled for left TKA on 04/29/14 by Dr. Lajoyce Corners.  History includes mixed ischemic/non-ischemic cardiomyopathy, CAD with severe MR s/p CABG (LIMA to distal LAD, SVG to DIAG) with MV repair (annuloplasty ring) 01/04/2009, chronic systolic CHF, s/p biventricular St. Jude ICD 03/2009, left BBB, HTN, Raynaud's syndrome, COPD, non-smoker, asthma, hyperthyroidism with Graves disease s/p radioactive iodine (now on levothyroxine for hypothyroidism), renal artery stenosis s/p angioplasty X 2, GERD, anemia, hysterectomy, sympathectomy, cervical fusion, back surgery, RUL lung wedge resection for Aspergillus treatment '01, facial cosmetic surgery, post-operative N/V. PCP is Dr. Marden Noble.  Cardiologist is Dr. Donato Schultz, last visit 04/01/14 with six month follow-up recommended. EP cardiologist is Dr. Johney Frame.   EKG on 06/09/13 showed: SR with BiV pacing, LAA.  Echo on 02/23/12 (Scanned under Media tab, 07/10/12) showed: Normal LV chamber size and wall thickness was mildly overall decrease contractile performance. Asynchronous overall contraction. Ejection fraction estimated at 45-50% (improved from prior EF of 30%). Prior mitral valve repair/annuloplasty ring intact. Mean gradient across mitral valve inflow to 6 mmHg consistent with prior repair. Mild tricuspid regurgitation. Mildly elevated estimated right ventricular systolic pressure. Analysis of mitral valve inflow, pulmonary vein Doppler and tissue Doppler suggests grade 2 diastolic dysfunction with elevated left atrial pressure.  Her last cardiac cath from 12/17/08 was prior to her CABG/MVR and showed severe proximal LAD/D1 stenosis, otherwise no significant CAD. Severely decreased LVEF of 20%. Elevated LVEDP of 30 mmHg, corresponding to wedge pressure of 27 mmHg indicative of severe diastolic dysfunction. Severe MR. Elevated right sided pressures including PA pressure with a mean of 38 mmHg consistent with mild  to moderate pulmonary hypertension.  CXR on 04/22/14 showed: No active cardiopulmonary disease.  PFT 06/20/12: fev1 1.3L/88%, Ratop 66. No BD resoinse. TLC 3/7L/93%, DLCO 11.6/82% - NORMAL (Dr. Marchelle Gearing)  Preoperative labs noted.   I spoke with Elnita Maxwell at Dr. Audrie Lia office and confirmed that they did not have cardiac clearance. Her CABG was > 4 years ago and echo just over two years ago.  She was seen by Dr. Judd Gaudier within the past month which is reassuring.  I did contact Dr. Judd Gaudier office and was told he was out of the office this week, but should be back by 04/28/14.  I then reviewed above with anesthesiologist Dr. Maple Hudson, who agreed that with her significant cardiac history it would be best to notify Dr. Anne Fu of plans for surgery to ensure no additional preoperative recommendations.  Message left for [place].    Velna Ochs [facility] Short Stay Center/Anesthesiology Phone [phone] 04/23/2014 4:49 PM  Addendum: 04/24/2014 4:54 PM Dr. Judd Gaudier did write a note of clearance.  See telephone encounter for 04/24/2014.

## 2014-04-24 NOTE — Telephone Encounter (Signed)
Pt aware she has been cleared for surgery.  Will forward information to device clinic for there knowledge.

## 2014-04-24 NOTE — Telephone Encounter (Signed)
She may proceed with knee replacement. Recent visit 04/01/14 reviewed. No evidence of high risk symptoms. Note ICD in place. Please forward to device clinic so that they are aware. Thanks.   Donato Schultz, MD

## 2014-04-28 MED ORDER — CLINDAMYCIN PHOSPHATE 900 MG/50ML IV SOLN
900.0000 mg | INTRAVENOUS | Status: AC
  Administered 2014-04-29: 900 mg via INTRAVENOUS
  Filled 2014-04-28: qty 50

## 2014-04-29 ENCOUNTER — Inpatient Hospital Stay (HOSPITAL_COMMUNITY): Payer: Medicare Other | Admitting: Certified Registered Nurse Anesthetist

## 2014-04-29 ENCOUNTER — Inpatient Hospital Stay (HOSPITAL_COMMUNITY)
Admission: RE | Admit: 2014-04-29 | Discharge: 2014-05-01 | DRG: 470 | Disposition: A | Discharge status: Home Health | Payer: Medicare Other | Source: Ambulatory Visit | Attending: Orthopedic Surgery | Admitting: Orthopedic Surgery

## 2014-04-29 ENCOUNTER — Encounter (HOSPITAL_COMMUNITY): Payer: Medicare Other | Admitting: Vascular Surgery

## 2014-04-29 ENCOUNTER — Encounter (HOSPITAL_COMMUNITY): Payer: Self-pay | Admitting: *Deleted

## 2014-04-29 ENCOUNTER — Encounter (HOSPITAL_COMMUNITY)
Admission: RE | Disposition: A | Discharge status: Home Health | Payer: Self-pay | Source: Ambulatory Visit | Attending: Orthopedic Surgery

## 2014-04-29 DIAGNOSIS — R269 Unspecified abnormalities of gait and mobility: Secondary | ICD-10-CM | POA: Diagnosis not present

## 2014-04-29 DIAGNOSIS — I739 Peripheral vascular disease, unspecified: Secondary | ICD-10-CM | POA: Diagnosis present

## 2014-04-29 DIAGNOSIS — I7300 Raynaud's syndrome without gangrene: Secondary | ICD-10-CM | POA: Diagnosis present

## 2014-04-29 DIAGNOSIS — G8918 Other acute postprocedural pain: Secondary | ICD-10-CM | POA: Diagnosis not present

## 2014-04-29 DIAGNOSIS — J4489 Other specified chronic obstructive pulmonary disease: Secondary | ICD-10-CM | POA: Diagnosis present

## 2014-04-29 DIAGNOSIS — J449 Chronic obstructive pulmonary disease, unspecified: Secondary | ICD-10-CM | POA: Diagnosis not present

## 2014-04-29 DIAGNOSIS — I5022 Chronic systolic (congestive) heart failure: Secondary | ICD-10-CM | POA: Diagnosis not present

## 2014-04-29 DIAGNOSIS — IMO0002 Reserved for concepts with insufficient information to code with codable children: Secondary | ICD-10-CM

## 2014-04-29 DIAGNOSIS — I2510 Atherosclerotic heart disease of native coronary artery without angina pectoris: Secondary | ICD-10-CM | POA: Diagnosis present

## 2014-04-29 DIAGNOSIS — Z882 Allergy status to sulfonamides status: Secondary | ICD-10-CM | POA: Diagnosis not present

## 2014-04-29 DIAGNOSIS — Z981 Arthrodesis status: Secondary | ICD-10-CM | POA: Diagnosis not present

## 2014-04-29 DIAGNOSIS — D649 Anemia, unspecified: Secondary | ICD-10-CM | POA: Diagnosis present

## 2014-04-29 DIAGNOSIS — I2589 Other forms of chronic ischemic heart disease: Secondary | ICD-10-CM | POA: Diagnosis present

## 2014-04-29 DIAGNOSIS — Z96652 Presence of left artificial knee joint: Secondary | ICD-10-CM

## 2014-04-29 DIAGNOSIS — Z96659 Presence of unspecified artificial knee joint: Secondary | ICD-10-CM

## 2014-04-29 DIAGNOSIS — Z886 Allergy status to analgesic agent status: Secondary | ICD-10-CM | POA: Diagnosis not present

## 2014-04-29 DIAGNOSIS — I454 Nonspecific intraventricular block: Secondary | ICD-10-CM | POA: Diagnosis present

## 2014-04-29 DIAGNOSIS — E785 Hyperlipidemia, unspecified: Secondary | ICD-10-CM | POA: Diagnosis present

## 2014-04-29 DIAGNOSIS — Z885 Allergy status to narcotic agent status: Secondary | ICD-10-CM | POA: Diagnosis not present

## 2014-04-29 DIAGNOSIS — Z954 Presence of other heart-valve replacement: Secondary | ICD-10-CM | POA: Diagnosis not present

## 2014-04-29 DIAGNOSIS — Z95810 Presence of automatic (implantable) cardiac defibrillator: Secondary | ICD-10-CM

## 2014-04-29 DIAGNOSIS — K219 Gastro-esophageal reflux disease without esophagitis: Secondary | ICD-10-CM | POA: Diagnosis present

## 2014-04-29 DIAGNOSIS — Z471 Aftercare following joint replacement surgery: Secondary | ICD-10-CM | POA: Diagnosis not present

## 2014-04-29 DIAGNOSIS — M6281 Muscle weakness (generalized): Secondary | ICD-10-CM | POA: Diagnosis not present

## 2014-04-29 DIAGNOSIS — Z888 Allergy status to other drugs, medicaments and biological substances status: Secondary | ICD-10-CM | POA: Diagnosis not present

## 2014-04-29 DIAGNOSIS — R1310 Dysphagia, unspecified: Secondary | ICD-10-CM | POA: Diagnosis not present

## 2014-04-29 DIAGNOSIS — Z881 Allergy status to other antibiotic agents status: Secondary | ICD-10-CM | POA: Diagnosis not present

## 2014-04-29 DIAGNOSIS — M25569 Pain in unspecified knee: Secondary | ICD-10-CM | POA: Diagnosis not present

## 2014-04-29 DIAGNOSIS — M1710 Unilateral primary osteoarthritis, unspecified knee: Secondary | ICD-10-CM | POA: Diagnosis not present

## 2014-04-29 DIAGNOSIS — Z8249 Family history of ischemic heart disease and other diseases of the circulatory system: Secondary | ICD-10-CM | POA: Diagnosis not present

## 2014-04-29 DIAGNOSIS — I509 Heart failure, unspecified: Secondary | ICD-10-CM | POA: Diagnosis present

## 2014-04-29 DIAGNOSIS — I10 Essential (primary) hypertension: Secondary | ICD-10-CM | POA: Diagnosis present

## 2014-04-29 DIAGNOSIS — Z951 Presence of aortocoronary bypass graft: Secondary | ICD-10-CM

## 2014-04-29 HISTORY — PX: TOTAL KNEE ARTHROPLASTY: SHX125

## 2014-04-29 SURGERY — ARTHROPLASTY, KNEE, TOTAL
Anesthesia: Regional | Site: Knee | Laterality: Left

## 2014-04-29 MED ORDER — METOCLOPRAMIDE HCL 10 MG PO TABS
5.0000 mg | ORAL_TABLET | Freq: Three times a day (TID) | ORAL | Status: DC | PRN
  Administered 2014-04-29: 10 mg via ORAL
  Filled 2014-04-29: qty 1

## 2014-04-29 MED ORDER — SODIUM CHLORIDE 0.9 % IV SOLN
INTRAVENOUS | Status: DC
  Administered 2014-04-29: 15:00:00 via INTRAVENOUS

## 2014-04-29 MED ORDER — ACETAMINOPHEN-CODEINE #3 300-30 MG PO TABS
1.0000 | ORAL_TABLET | ORAL | Status: DC | PRN
  Administered 2014-04-29 (×2): 2 via ORAL
  Administered 2014-04-30: 1 via ORAL
  Filled 2014-04-29 (×2): qty 2
  Filled 2014-04-29: qty 1

## 2014-04-29 MED ORDER — FENTANYL CITRATE 0.05 MG/ML IJ SOLN
INTRAMUSCULAR | Status: AC
  Administered 2014-04-29: 25 ug via INTRAVENOUS
  Filled 2014-04-29: qty 2

## 2014-04-29 MED ORDER — ONDANSETRON HCL 4 MG/2ML IJ SOLN
4.0000 mg | Freq: Four times a day (QID) | INTRAMUSCULAR | Status: DC | PRN
  Administered 2014-04-29: 4 mg via INTRAVENOUS
  Filled 2014-04-29: qty 2

## 2014-04-29 MED ORDER — SUCCINYLCHOLINE CHLORIDE 20 MG/ML IJ SOLN
INTRAMUSCULAR | Status: AC
  Filled 2014-04-29: qty 1

## 2014-04-29 MED ORDER — CARVEDILOL 12.5 MG PO TABS
12.5000 mg | ORAL_TABLET | Freq: Two times a day (BID) | ORAL | Status: DC
  Administered 2014-04-29 – 2014-05-01 (×4): 12.5 mg via ORAL
  Filled 2014-04-29 (×6): qty 1

## 2014-04-29 MED ORDER — BUPIVACAINE LIPOSOME 1.3 % IJ SUSP
INTRAMUSCULAR | Status: DC | PRN
  Administered 2014-04-29: 20 mL

## 2014-04-29 MED ORDER — NORTRIPTYLINE HCL 25 MG PO CAPS
25.0000 mg | ORAL_CAPSULE | Freq: Every day | ORAL | Status: DC
  Administered 2014-04-29 – 2014-04-30 (×2): 25 mg via ORAL
  Filled 2014-04-29 (×3): qty 1

## 2014-04-29 MED ORDER — PHENYLEPHRINE 40 MCG/ML (10ML) SYRINGE FOR IV PUSH (FOR BLOOD PRESSURE SUPPORT)
PREFILLED_SYRINGE | INTRAVENOUS | Status: AC
Start: 2014-04-29 — End: 2014-04-29
  Filled 2014-04-29: qty 10

## 2014-04-29 MED ORDER — METHOCARBAMOL 1000 MG/10ML IJ SOLN
500.0000 mg | Freq: Four times a day (QID) | INTRAVENOUS | Status: DC | PRN
  Filled 2014-04-29: qty 5

## 2014-04-29 MED ORDER — PROPOFOL 10 MG/ML IV BOLUS
INTRAVENOUS | Status: AC
  Filled 2014-04-29: qty 20

## 2014-04-29 MED ORDER — GLYCOPYRROLATE 0.2 MG/ML IJ SOLN
INTRAMUSCULAR | Status: DC | PRN
  Administered 2014-04-29: 0.6 mg via INTRAVENOUS

## 2014-04-29 MED ORDER — LACTATED RINGERS IV SOLN
INTRAVENOUS | Status: DC | PRN
  Administered 2014-04-29 (×2): via INTRAVENOUS

## 2014-04-29 MED ORDER — FERROUS SULFATE 325 (65 FE) MG PO TABS
325.0000 mg | ORAL_TABLET | ORAL | Status: DC
  Administered 2014-04-29 – 2014-05-01 (×2): 325 mg via ORAL
  Filled 2014-04-29 (×2): qty 1

## 2014-04-29 MED ORDER — METHOCARBAMOL 500 MG PO TABS
500.0000 mg | ORAL_TABLET | Freq: Four times a day (QID) | ORAL | Status: DC | PRN
  Administered 2014-04-29 – 2014-04-30 (×2): 500 mg via ORAL
  Filled 2014-04-29 (×2): qty 1

## 2014-04-29 MED ORDER — MENTHOL 3 MG MT LOZG: 1.0000 | LOZENGE | OROMUCOSAL | Status: DC | PRN

## 2014-04-29 MED ORDER — METHOCARBAMOL 1000 MG/10ML IJ SOLN
500.0000 mg | INTRAMUSCULAR | Status: AC
  Administered 2014-04-29: 500 mg via INTRAVENOUS
  Filled 2014-04-29: qty 5

## 2014-04-29 MED ORDER — ONDANSETRON HCL 4 MG/2ML IJ SOLN
INTRAMUSCULAR | Status: AC
  Filled 2014-04-29: qty 2

## 2014-04-29 MED ORDER — SODIUM CHLORIDE 0.9 % IR SOLN
Status: DC | PRN
  Administered 2014-04-29: 1000 mL

## 2014-04-29 MED ORDER — SENNOSIDES-DOCUSATE SODIUM 8.6-50 MG PO TABS: 1.0000 | ORAL_TABLET | Freq: Every evening | ORAL | Status: DC | PRN

## 2014-04-29 MED ORDER — MONTELUKAST SODIUM 10 MG PO TABS
10.0000 mg | ORAL_TABLET | Freq: Every day | ORAL | Status: DC
  Administered 2014-04-30 – 2014-05-01 (×2): 10 mg via ORAL
  Filled 2014-04-29 (×3): qty 1

## 2014-04-29 MED ORDER — ALBUMIN HUMAN 5 % IV SOLN
12.5000 g | Freq: Once | INTRAVENOUS | Status: AC
  Administered 2014-04-29: 12.5 g via INTRAVENOUS

## 2014-04-29 MED ORDER — METOCLOPRAMIDE HCL 5 MG/ML IJ SOLN: 5.0000 mg | Freq: Three times a day (TID) | INTRAMUSCULAR | Status: DC | PRN

## 2014-04-29 MED ORDER — DIPHENHYDRAMINE HCL 12.5 MG/5ML PO ELIX: 12.5000 mg | ORAL_SOLUTION | ORAL | Status: DC | PRN

## 2014-04-29 MED ORDER — PROPOFOL 10 MG/ML IV BOLUS
INTRAVENOUS | Status: DC | PRN
  Administered 2014-04-29 (×2): 50 mg via INTRAVENOUS

## 2014-04-29 MED ORDER — KETOROLAC TROMETHAMINE 15 MG/ML IJ SOLN
7.5000 mg | Freq: Four times a day (QID) | INTRAMUSCULAR | Status: AC
  Administered 2014-04-29 – 2014-04-30 (×2): 7.5 mg via INTRAVENOUS

## 2014-04-29 MED ORDER — FENTANYL CITRATE 0.05 MG/ML IJ SOLN
25.0000 ug | INTRAMUSCULAR | Status: DC | PRN
  Administered 2014-04-29 (×4): 25 ug via INTRAVENOUS

## 2014-04-29 MED ORDER — FENTANYL CITRATE 0.05 MG/ML IJ SOLN
INTRAMUSCULAR | Status: DC | PRN
  Administered 2014-04-29: 50 ug via INTRAVENOUS
  Administered 2014-04-29: 100 ug via INTRAVENOUS

## 2014-04-29 MED ORDER — COUMADIN BOOK
Freq: Once | Status: AC
  Administered 2014-04-29: 15:00:00
  Filled 2014-04-29: qty 1

## 2014-04-29 MED ORDER — MIDAZOLAM HCL 2 MG/2ML IJ SOLN
INTRAMUSCULAR | Status: AC
  Filled 2014-04-29: qty 2

## 2014-04-29 MED ORDER — ROCURONIUM BROMIDE 100 MG/10ML IV SOLN
INTRAVENOUS | Status: DC | PRN
  Administered 2014-04-29: 30 mg via INTRAVENOUS

## 2014-04-29 MED ORDER — WARFARIN SODIUM 5 MG PO TABS
5.0000 mg | ORAL_TABLET | Freq: Once | ORAL | Status: AC
  Administered 2014-04-29: 5 mg via ORAL
  Filled 2014-04-29: qty 1

## 2014-04-29 MED ORDER — LEVOTHYROXINE SODIUM 75 MCG PO TABS
75.0000 ug | ORAL_TABLET | Freq: Every day | ORAL | Status: DC
  Administered 2014-04-30 – 2014-05-01 (×2): 75 ug via ORAL
  Filled 2014-04-29 (×3): qty 1

## 2014-04-29 MED ORDER — DOCUSATE SODIUM 100 MG PO CAPS
100.0000 mg | ORAL_CAPSULE | Freq: Two times a day (BID) | ORAL | Status: DC
  Administered 2014-04-29 – 2014-05-01 (×4): 100 mg via ORAL
  Filled 2014-04-29 (×5): qty 1

## 2014-04-29 MED ORDER — EPHEDRINE SULFATE 50 MG/ML IJ SOLN
INTRAMUSCULAR | Status: DC | PRN
  Administered 2014-04-29: 20 mg via INTRAVENOUS

## 2014-04-29 MED ORDER — ROCURONIUM BROMIDE 50 MG/5ML IV SOLN
INTRAVENOUS | Status: AC
  Filled 2014-04-29: qty 1

## 2014-04-29 MED ORDER — MAGNESIUM CITRATE PO SOLN: 1.0000 | Freq: Once | ORAL | Status: AC | PRN

## 2014-04-29 MED ORDER — ACETAMINOPHEN 325 MG PO TABS: 650.0000 mg | ORAL_TABLET | Freq: Four times a day (QID) | ORAL | Status: DC | PRN

## 2014-04-29 MED ORDER — FENTANYL CITRATE 0.05 MG/ML IJ SOLN
INTRAMUSCULAR | Status: AC
  Filled 2014-04-29: qty 5

## 2014-04-29 MED ORDER — SODIUM CHLORIDE 0.9 % IJ SOLN
INTRAMUSCULAR | Status: DC | PRN
  Administered 2014-04-29: 40 mL via INTRAVENOUS

## 2014-04-29 MED ORDER — EPHEDRINE SULFATE 50 MG/ML IJ SOLN
INTRAMUSCULAR | Status: AC
  Filled 2014-04-29: qty 1

## 2014-04-29 MED ORDER — ONDANSETRON HCL 4 MG PO TABS: 4.0000 mg | ORAL_TABLET | Freq: Four times a day (QID) | ORAL | Status: DC | PRN

## 2014-04-29 MED ORDER — LIDOCAINE HCL (CARDIAC) 20 MG/ML IV SOLN
INTRAVENOUS | Status: AC
  Filled 2014-04-29: qty 5

## 2014-04-29 MED ORDER — HYDRALAZINE HCL 20 MG/ML IJ SOLN
INTRAMUSCULAR | Status: DC | PRN
  Administered 2014-04-29: 2.5 mg via INTRAVENOUS

## 2014-04-29 MED ORDER — NEOSTIGMINE METHYLSULFATE 10 MG/10ML IV SOLN
INTRAVENOUS | Status: DC | PRN
  Administered 2014-04-29: 3 mg via INTRAVENOUS

## 2014-04-29 MED ORDER — BISACODYL 5 MG PO TBEC: 5.0000 mg | DELAYED_RELEASE_TABLET | Freq: Every day | ORAL | Status: DC | PRN

## 2014-04-29 MED ORDER — CLINDAMYCIN PHOSPHATE 600 MG/50ML IV SOLN
600.0000 mg | Freq: Four times a day (QID) | INTRAVENOUS | Status: AC
  Administered 2014-04-29 (×2): 600 mg via INTRAVENOUS
  Filled 2014-04-29 (×2): qty 50

## 2014-04-29 MED ORDER — ESMOLOL HCL 10 MG/ML IV SOLN
INTRAVENOUS | Status: DC | PRN
  Administered 2014-04-29: 20 mg via INTRAVENOUS

## 2014-04-29 MED ORDER — ALBUMIN HUMAN 5 % IV SOLN
INTRAVENOUS | Status: AC
  Filled 2014-04-29: qty 250

## 2014-04-29 MED ORDER — PHENOL 1.4 % MT LIQD
1.0000 | OROMUCOSAL | Status: DC | PRN
Start: 2014-04-29 — End: 2014-05-01

## 2014-04-29 MED ORDER — KETOROLAC TROMETHAMINE 15 MG/ML IJ SOLN
INTRAMUSCULAR | Status: AC
  Filled 2014-04-29: qty 1

## 2014-04-29 MED ORDER — ETOMIDATE 2 MG/ML IV SOLN
INTRAVENOUS | Status: DC | PRN
  Administered 2014-04-29: 10 mg via INTRAVENOUS

## 2014-04-29 MED ORDER — BUPIVACAINE LIPOSOME 1.3 % IJ SUSP
20.0000 mL | INTRAMUSCULAR | Status: DC
  Filled 2014-04-29: qty 20

## 2014-04-29 MED ORDER — PANTOPRAZOLE SODIUM 40 MG PO TBEC
40.0000 mg | DELAYED_RELEASE_TABLET | Freq: Every day | ORAL | Status: DC
  Administered 2014-05-01: 40 mg via ORAL
  Filled 2014-04-29 (×2): qty 1

## 2014-04-29 MED ORDER — IRBESARTAN 75 MG PO TABS
75.0000 mg | ORAL_TABLET | Freq: Every day | ORAL | Status: DC
  Administered 2014-04-30 – 2014-05-01 (×2): 75 mg via ORAL
  Filled 2014-04-29 (×3): qty 1

## 2014-04-29 MED ORDER — PHENYLEPHRINE HCL 10 MG/ML IJ SOLN
INTRAMUSCULAR | Status: DC | PRN
  Administered 2014-04-29: 160 ug via INTRAVENOUS

## 2014-04-29 MED ORDER — SODIUM CHLORIDE 0.9 % IJ SOLN
INTRAMUSCULAR | Status: AC
  Filled 2014-04-29: qty 10

## 2014-04-29 MED ORDER — FLUCONAZOLE 150 MG PO TABS
150.0000 mg | ORAL_TABLET | ORAL | Status: DC
  Filled 2014-04-29: qty 1

## 2014-04-29 MED ORDER — WARFARIN - PHARMACIST DOSING INPATIENT: Freq: Every day | Status: DC

## 2014-04-29 MED ORDER — ACETAMINOPHEN 650 MG RE SUPP: 650.0000 mg | Freq: Four times a day (QID) | RECTAL | Status: DC | PRN

## 2014-04-29 SURGICAL SUPPLY — 52 items
BLADE SAGITTAL 25.0X1.27X90 (BLADE) ×2 IMPLANT
BLADE SAGITTAL 25.0X1.27X90MM (BLADE) ×1
BLADE SAW SGTL 13.0X1.19X90.0M (BLADE) ×3 IMPLANT
BLADE SURG 21 STRL SS (BLADE) ×6 IMPLANT
BNDG COHESIVE 6X5 TAN STRL LF (GAUZE/BANDAGES/DRESSINGS) ×6 IMPLANT
BONE CEMENT PALACOSE (Orthopedic Implant) ×6 IMPLANT
BOWL SMART MIX CTS (DISPOSABLE) ×3 IMPLANT
CAP POR TM CP VIT E LN CER HD ×3 IMPLANT
CEMENT BONE PALACOSE (Orthopedic Implant) ×2 IMPLANT
COVER SURGICAL LIGHT HANDLE (MISCELLANEOUS) ×3 IMPLANT
CUFF TOURNIQUET SINGLE 34IN LL (TOURNIQUET CUFF) ×3 IMPLANT
CUFF TOURNIQUET SINGLE 44IN (TOURNIQUET CUFF) IMPLANT
DRAPE EXTREMITY T 121X128X90 (DRAPE) ×3 IMPLANT
DRAPE PROXIMA HALF (DRAPES) ×3 IMPLANT
DRAPE U-SHAPE 47X51 STRL (DRAPES) ×3 IMPLANT
DRSG ADAPTIC 3X8 NADH LF (GAUZE/BANDAGES/DRESSINGS) ×3 IMPLANT
DRSG PAD ABDOMINAL 8X10 ST (GAUZE/BANDAGES/DRESSINGS) ×3 IMPLANT
DURAPREP 26ML APPLICATOR (WOUND CARE) ×3 IMPLANT
ELECT REM PT RETURN 9FT ADLT (ELECTROSURGICAL) ×3
ELECTRODE REM PT RTRN 9FT ADLT (ELECTROSURGICAL) ×1 IMPLANT
FACESHIELD WRAPAROUND (MASK) ×3 IMPLANT
GAUZE SPONGE 4X4 12PLY STRL (GAUZE/BANDAGES/DRESSINGS) ×3 IMPLANT
GLOVE BIOGEL M 6.5 STRL (GLOVE) ×3 IMPLANT
GLOVE BIOGEL M 7.0 STRL (GLOVE) ×6 IMPLANT
GLOVE BIOGEL PI IND STRL 7.0 (GLOVE) ×2 IMPLANT
GLOVE BIOGEL PI IND STRL 9 (GLOVE) ×3 IMPLANT
GLOVE BIOGEL PI INDICATOR 7.0 (GLOVE) ×4
GLOVE BIOGEL PI INDICATOR 9 (GLOVE) ×6
GLOVE SURG ORTHO 9.0 STRL STRW (GLOVE) ×3 IMPLANT
GOWN STRL REUS W/ TWL XL LVL3 (GOWN DISPOSABLE) ×3 IMPLANT
GOWN STRL REUS W/TWL XL LVL3 (GOWN DISPOSABLE) ×6
HANDPIECE INTERPULSE COAX TIP (DISPOSABLE) ×2
KIT BASIN OR (CUSTOM PROCEDURE TRAY) ×3 IMPLANT
KIT ROOM TURNOVER OR (KITS) ×3 IMPLANT
MANIFOLD NEPTUNE II (INSTRUMENTS) ×3 IMPLANT
NEEDLE SPNL 18GX3.5 QUINCKE PK (NEEDLE) ×3 IMPLANT
NS IRRIG 1000ML POUR BTL (IV SOLUTION) ×3 IMPLANT
PACK TOTAL JOINT (CUSTOM PROCEDURE TRAY) ×3 IMPLANT
PAD ARMBOARD 7.5X6 YLW CONV (MISCELLANEOUS) ×6 IMPLANT
PADDING CAST COTTON 6X4 STRL (CAST SUPPLIES) ×3 IMPLANT
SET HNDPC FAN SPRY TIP SCT (DISPOSABLE) ×1 IMPLANT
STAPLER VISISTAT 35W (STAPLE) ×3 IMPLANT
SUCTION FRAZIER TIP 10 FR DISP (SUCTIONS) IMPLANT
SUT VIC AB 0 CTB1 27 (SUTURE) IMPLANT
SUT VIC AB 1 CTX 36 (SUTURE)
SUT VIC AB 1 CTX36XBRD ANBCTR (SUTURE) IMPLANT
SYR 50ML LL SCALE MARK (SYRINGE) ×3 IMPLANT
TOWEL OR 17X24 6PK STRL BLUE (TOWEL DISPOSABLE) ×3 IMPLANT
TOWEL OR 17X26 10 PK STRL BLUE (TOWEL DISPOSABLE) ×3 IMPLANT
TRAY FOLEY CATH 16FRSI W/METER (SET/KITS/TRAYS/PACK) IMPLANT
WATER STERILE IRR 1000ML POUR (IV SOLUTION) ×6 IMPLANT
WRAP KNEE MAXI GEL POST OP (GAUZE/BANDAGES/DRESSINGS) ×3 IMPLANT

## 2014-04-29 NOTE — Evaluation (Signed)
Physical Therapy Evaluation Patient Details Name: [name] MRN: [id number] DOB: 1933-02-28 Today's Date: 04/29/2014   History of Present Illness  Pt is a 80 y.o. female s/p Lt TKA 04/29/14.  Clinical Impression  Pt is s/p Lt TKA POD#0 resulting in the deficits listed below (see PT Problem List).  Pt will benefit from skilled PT to increase their independence and safety with mobility to allow discharge to the venue listed below. Pt greatly limited due to pain during evaluation. Husband not present to determine amount of (A) he can provide at home. Will cont to assess pt mobility to determine if HHPT vs SNF is best option.      Follow Up Recommendations Home health PT;Supervision/Assistance - 24 hour    Equipment Recommendations  Rolling walker with 5" wheels    Recommendations for Other Services OT consult     Precautions / Restrictions Precautions Precautions: Fall;Knee Precaution Booklet Issued: No Precaution Comments: pt educated to not have pillow under knee Restrictions Weight Bearing Restrictions: Yes LLE Weight Bearing: Weight bearing as tolerated      Mobility  Bed Mobility Overal bed mobility: Needs Assistance Bed Mobility: Supine to Sit     Supine to sit: Min assist;HOB elevated     General bed mobility comments: (A) to advance Lt LE to/off EOB; cues for sequencing and relied heavily on handrails   Transfers Overall transfer level: Needs assistance Equipment used: Rolling walker (2 wheeled) Transfers: Sit to/from [employer] Sit to Stand: Min assist Stand pivot transfers: Min assist       General transfer comment: min (A) to elevate trunk to standing position; cues for hand placement and sequencing with RW; able to perform 3 pivotal steps to chair with cues for sequencing  Ambulation/Gait             General Gait Details: pivotal steps to chair  Stairs            Wheelchair Mobility    Modified Rankin (Stroke Patients  Only)       Balance Overall balance assessment: Needs assistance Sitting-balance support: Feet supported;No upper extremity supported Sitting balance-Leahy Scale: Good Sitting balance - Comments: sat EOB without c/o dizziness for 5 min   Standing balance support: During functional activity;Bilateral upper extremity supported Standing balance-Leahy Scale: Poor Standing balance comment: relies on RW for bil UE support and min (A)                             Pertinent Vitals/Pain "10/10, from the top of my leg to below my knee" patient repositioned for comfort with ice pack    Home Living Family/patient expects to be discharged to:: Private residence Living Arrangements: Spouse/significant other Available Help at Discharge: Family;Available 24 hours/day Type of Home: House Home Access: Level entry     Home Layout: One level Home Equipment: Crutches Additional Comments: pt has walk in shower    Prior Function Level of Independence: Independent               Hand Dominance        Extremity/Trunk Assessment   Upper Extremity Assessment: Defer to OT evaluation           Lower Extremity Assessment: LLE deficits/detail      Cervical / Trunk Assessment: Normal  Communication   Communication: No difficulties  Cognition Arousal/Alertness: Awake/alert Behavior During Therapy: Anxious Overall Cognitive Status: Within Functional Limits for tasks assessed  General Comments      Exercises Total Joint Exercises Ankle Circles/Pumps: AROM;Both;10 reps;Supine      Assessment/Plan    PT Assessment Patient needs continued PT services  PT Diagnosis Difficulty walking;Generalized weakness;Acute pain   PT Problem List Decreased strength;Decreased range of motion;Decreased activity tolerance;Decreased balance;Decreased mobility;Decreased knowledge of use of DME;Pain  PT Treatment Interventions DME instruction;Gait  training;Functional mobility training;Therapeutic activities;Therapeutic exercise;Balance training;Neuromuscular re-education;Patient/family education   PT Goals (Current goals can be found in the Care Plan section) Acute Rehab PT Goals Patient Stated Goal: to go home with my husband when the pain is better PT Goal Formulation: With patient Time For Goal Achievement: 05/06/14 Potential to Achieve Goals: Good    Frequency 7X/week   Barriers to discharge        Co-evaluation               End of Session Equipment Utilized During Treatment: Gait belt Activity Tolerance: Patient limited by pain Patient left: in chair;with call bell/phone within reach Nurse Communication: Mobility status;Precautions;Weight bearing status         Time: 1431-1449 PT Time Calculation (min): 18 min   Charges:   PT Evaluation $Initial PT Evaluation Tier I: 1 Procedure PT Treatments $Gait Training: 8-22 mins   PT G CodesDonell Sievert:          Keigan Girten N, [place]PT  [phone] 04/29/2014, 4:30 PM

## 2014-04-29 NOTE — Progress Notes (Signed)
Utilization review completed.  

## 2014-04-29 NOTE — Transfer of Care (Signed)
Immediate Anesthesia Transfer of Care Note  Patient: [name]  Procedure(s) Performed: Procedure(s): LEFT TOTAL KNEE ARTHROPLASTY (Left)  Patient Location: PACU  Anesthesia Type:GA combined with regional for post-op pain  Level of Consciousness: awake, alert , oriented and patient cooperative  Airway & Oxygen Therapy: Patient Spontanous Breathing and Patient connected to nasal cannula oxygen  Post-op Assessment: Report given to PACU RN, Post -op Vital signs reviewed and stable and Patient moving all extremities X 4  Post vital signs: Reviewed and stable  Complications: No apparent anesthesia complications

## 2014-04-29 NOTE — Anesthesia Procedure Notes (Addendum)
Procedure Name: Intubation Date/Time: 04/29/2014 8:46 AM Performed by: Rogelia Boga Pre-anesthesia Checklist: Patient identified, Emergency Drugs available, Suction available, Patient being monitored and Timeout performed Patient Re-evaluated:Patient Re-evaluated prior to inductionOxygen Delivery Method: Circle system utilized Preoxygenation: Pre-oxygenation with 100% oxygen Intubation Type: IV induction Ventilation: Mask ventilation without difficulty and Oral airway inserted - appropriate to patient size Laryngoscope Size: Mac and 4 Grade View: Grade I Tube type: Oral Tube size: 7.0 mm Number of attempts: 1 Airway Equipment and Method: Stylet Placement Confirmation: ETT inserted through vocal cords under direct vision,  positive ETCO2 and breath sounds checked- equal and bilateral Secured at: 20 cm Tube secured with: Tape Dental Injury: Teeth and Oropharynx as per pre-operative assessment    Anesthesia Regional Block:  Femoral nerve block  Pre-Anesthetic Checklist: ,, timeout performed, Correct Patient, Correct Site, Correct Laterality, Correct Procedure, Correct Position, site marked, Risks and benefits discussed,  Surgical consent,  Pre-op evaluation,  At surgeon's request and post-op pain management  Laterality: Left  Prep: chloraprep       Needles:  Injection technique: Single-shot      Needle Gauge: 22 and 22 G    Additional Needles:  Procedures: Doppler guided and nerve stimulator Femoral nerve block Narrative:  Start time: 04/29/2014 7:55 AM End time: 04/29/2014 8:10 AM Injection made incrementally with aspirations every 5 mL.  Performed by: Personally  Anesthesiologist: Dr. Randa Evens  Additional Notes: A functioning IV was confirmed and monitors were applied.  Sterile prep and drape, hand hygiene and sterile gloves were used.  Negative aspiration prior to incremental administration of local anesthetic using the 22 ga needle. The patient tolerated the  procedure well.

## 2014-04-29 NOTE — Progress Notes (Signed)
ANTICOAGULATION CONSULT NOTE - Initial Consult  Pharmacy Consult for coumadin Indication: VTE prophylaxis  Allergies  Allergen Reactions  . Alprazolam     REACTION: ulcer's in mouth and extreme constipation  . Amitriptyline   . Aspirin     REACTION: upsets stomach  . Calcitonin ([place])     REACTION: rash over entire body  . Cefuroxime Axetil     REACTION: either rash and diarrhea  . Cephalexin     REACTION: rash  . Ciprofloxacin     REACTION: rash  . Clonazepam     REACTION: 1/2 pill makes grogginess next day  . Doxycycline     REACTION: severe rash over entire body  . Erythromycin     REACTION: rash  . Fulvicin P-G [Griseofulvin] Other (See Comments)    Severe headaches  . Hydrocodone     REACTION: nausea and totally out of it  . Hydrocodone-Acetaminophen     REACTION: reaction forgeotten  . Ketoconazole     REACTION: terribly weak, voice shook  . Morphine And Related Nausea And Vomiting  . Oxycodone-Aspirin     REACTION: nausea  . Penicillins     REACTION: rash  . Sulfamethoxazole-Trimethoprim     REACTION: either rash or diarrhea  . Sulfonamide Derivatives     REACTION: reaction forgotten  . Hydromorphone Rash    Patient Measurements: Height: 5' (152.4 cm) Weight: 114 lb (51.71 kg) IBW/kg (Calculated) : 45.5   Vital Signs: Temp: 97.5 F (36.4 C) (08/05 1255) Temp src: Oral (08/05 1255) BP: 151/58 mmHg (08/05 1255) Pulse Rate: 60 (08/05 1255)  Labs: No results found for this basename: HGB, HCT, PLT, APTT, LABPROT, INR, HEPARINUNFRC, CREATININE, CKTOTAL, CKMB, TROPONINI,  in the last 72 hours  Estimated Creatinine Clearance: 31.7 ml/min (by C-G formula based on Cr of 1.05).   Medical History: Past Medical History  Diagnosis Date  . Ischemic cardiomyopathy     severe. Left ventricular ejection fraction 20%.   . Chronic systolic heart failure     NYHA class II.  [name]. Chronic pulmonary disease   . BBB (bundle branch block)     s/p BiV ICD implant   . Raynaud's syndrome   . Neuromuscular scoliosis of thoracolumbar region     type of scoliosis was not specified.   [name]. DJD (degenerative joint disease), cervical   . DJD (degenerative joint disease), lumbar   . HTN (hypertension)   . Hyperthyroidism     following Graves disease  . Renal artery stenosis     Treated with angioplast in 1980 and 1987.   . S/P CABG (coronary artery bypass graft) April 2012  . FH: mitral valve repair     with 26 mm Edwards ring angioplasty,   . COPD (chronic obstructive pulmonary disease)   . Full dentures   . PONV (postoperative nausea and vomiting)   . CHF (congestive heart failure)   . Dizziness   . Automatic implantable cardioverter-defibrillator in situ   . Pacemaker   . Asthma   . Pneumonia     hx  . GERD (gastroesophageal reflux disease)   . Anemia     takes iron 3 days per week    Medications:  See med rec  Assessment: Patient is a 80 y.o F s/p left TKA to start coumadin for VTE prophylaxis.  Pre-op INR on 7/29 was 1.   Goal of Therapy:  INR 2-3    Plan:  1) coumadin 5mg  PO x1 today 2) will monitor INR  closely for drug-drug intxns as patient's also on fluconazole  Jacobie Stamey P 04/29/2014,1:06 PM

## 2014-04-29 NOTE — Anesthesia Postprocedure Evaluation (Signed)
  Anesthesia Post-op Note  Patient: [name]  Procedure(s) Performed: Procedure(s): LEFT TOTAL KNEE ARTHROPLASTY (Left)  Patient Location: PACU  Anesthesia Type:General  Level of Consciousness: awake  Airway and Oxygen Therapy: Patient Spontanous Breathing  Post-op Pain: mild  Post-op Assessment: Post-op Vital signs reviewed  Post-op Vital Signs: Reviewed  Last Vitals:  Filed Vitals:   04/29/14 1145  BP: 112/42  Pulse: 59  Temp:   Resp: 11    Complications: No apparent anesthesia complications

## 2014-04-29 NOTE — H&P (Signed)
TOTAL KNEE ADMISSION H&P  Patient is being admitted for left total knee arthroplasty.  Subjective:  Chief Complaint:left knee pain.  HPI: [name], 80 y.o. female, has a history of pain and functional disability in the left knee due to arthritis and has failed non-surgical conservative treatments for greater than 12 weeks to includeNSAID's and/or analgesics, use of assistive devices, weight reduction as appropriate and activity modification.  Onset of symptoms was gradual, starting 8 years ago with gradually worsening course since that time. The patient noted no past surgery on the left knee(s).  Patient currently rates pain in the left knee(s) at 8 out of 10 with activity. Patient has night pain, worsening of pain with activity and weight bearing, pain that interferes with activities of daily living, pain with passive range of motion, crepitus and joint swelling.  Patient has evidence of subchondral cysts, subchondral sclerosis, periarticular osteophytes and joint space narrowing by imaging studies. This patient has had avascular necrosis of the knee. There is no active infection.  Patient Active Problem List   Diagnosis Date Noted  . Dizziness 08/28/2013  . FH: mitral valve repair   . COPD (chronic obstructive pulmonary disease)   . BBB (bundle branch block)   . Ischemic cardiomyopathy   . Automatic implantable cardioverter-defibrillator in situ 06/24/2012  . Preop respiratory exam 05/17/2012  . Chronic systolic heart failure 03/01/2011  . Other specified forms of chronic ischemic heart disease 03/01/2011  . S/P CABG (coronary artery bypass graft) 12/25/2010  . SHORTNESS OF BREATH (SOB) 05/07/2009  . HYPERLIPIDEMIA 05/06/2009  . HYPERTENSION 05/06/2009   Past Medical History  Diagnosis Date  . Ischemic cardiomyopathy     severe. Left ventricular ejection fraction 20%.   . Chronic systolic heart failure     NYHA class II.  [name]. Chronic pulmonary disease   . BBB (bundle branch  block)     s/p BiV ICD implant  . Raynaud's syndrome   . Neuromuscular scoliosis of thoracolumbar region     type of scoliosis was not specified.   [name]. DJD (degenerative joint disease), cervical   . DJD (degenerative joint disease), lumbar   . HTN (hypertension)   . Hyperthyroidism     following Graves disease  . Renal artery stenosis     Treated with angioplast in 1980 and 1987.   . S/P CABG (coronary artery bypass graft) April 2012  . FH: mitral valve repair     with 26 mm Edwards ring angioplasty,   . COPD (chronic obstructive pulmonary disease)   . Full dentures   . PONV (postoperative nausea and vomiting)   . CHF (congestive heart failure)   . Dizziness   . Automatic implantable cardioverter-defibrillator in situ   . Pacemaker   . Asthma   . Pneumonia     hx  . GERD (gastroesophageal reflux disease)   . Anemia     takes iron 3 days per week    Past Surgical History  Procedure Laterality Date  . Total abdominal hysterectomy    . Sympathectomy    . Tonsillectomy    . Renal artery ballon dilation    . Rotator cuff repair      right and left  . Laminotomy/foraminotomy      with decompression of the L4 nerve root   . Cervical fusion      C5-6 and C6-7, C4-5 with titanium plates  . Umbilical hernia repair    . Wedge resection  2001    for  the right upper lobe for Aspergillus treatement.  Dr. Edwyna Shell apprix 2001.  [name] Ptca      of bilateral renal arteries  . Cataract extraction    . Carpal tunnel release    . Appendectomy    . Breast lumpectomy      left breast  . Renal artery ballon dilation      x2  . Precancerous growth      tops of ear removed. bilateral.   . Implantation of icd  2010    BiV ICD implant (SJM) by Dr Amil Amen 03/2009  . Repair extensor tendon  07/10/2012    Procedure: REPAIR EXTENSOR TENDON;  Surgeon: Nicki Reaper, MD;  Location: [facility];  Service: Orthopedics;  Laterality: Right;  METACARPAL PHALANGEAL REPLACEMENT ARTHROPLASTIES  RIGHT INDEX, MIDDLE, AND RING FINGERS  CENTRALIZATION EXTENSOR TENDONS INDEX, MIDDLE,  AND RING FINGER   . Finger arthroplasty  07/10/2012    Procedure: FINGER ARTHROPLASTY;  Surgeon: Nicki Reaper, MD;  Location: [facility];  Service: Orthopedics;  Laterality: Right;  METACARPAL PHALANGEAL ARTHROPLASTIES RIGHT INDEX, MIDDLE, AND RING FINGERS *TWO BLUE LOOPS LEFT IN AS DRAINS*  . Esophageal manometry N/A 04/07/2013    Procedure: ESOPHAGEAL MANOMETRY (EM);  Surgeon: Charolett Bumpers, MD;  Location: [facility];  Service: Endoscopy;  Laterality: N/A;  . Carpometacarpel suspension plasty Right 11/12/2013    Procedure: SUSPENSION PLASTY RIGHT THUMB, TRAPEZIUM EXCISION;  Surgeon: Nicki Reaper, MD;  Location: [facility];  Service: Orthopedics;  Laterality: Right;  . Tendon transfer Right 11/12/2013    Procedure: RIGHT ABDUCTOR POLLICUS LONGUS TENDON TRANSFER;  Surgeon: Nicki Reaper, MD;  Location: [facility];  Service: Orthopedics;  Laterality: Right;  . Tonsillectomy    . Hemorrhoidectomy with hemorrhoid banding    . Facial cosmetic surgery    . Back surgery    . Eye surgery Bilateral     cataracts  . Hernia repair      umbilical  . Insert / replace / remove pacemaker      icd/pacer  . Lung mass removal Right   . Left knee arthroscopic    . Coronary artery bypass graft  2010    mvr/cabg    No prescriptions prior to admission   Allergies  Allergen Reactions  . Alprazolam     REACTION: ulcer's in mouth and extreme constipation  . Amitriptyline   . Aspirin     REACTION: upsets stomach  . Calcitonin ([place])     REACTION: rash over entire body  . Cefuroxime Axetil     REACTION: either rash and diarrhea  . Cephalexin     REACTION: rash  . Ciprofloxacin     REACTION: rash  . Clonazepam     REACTION: 1/2 pill makes grogginess next day  . Doxycycline     REACTION: severe rash over entire body  . Erythromycin     REACTION: rash  . Fulvicin  P-G [Griseofulvin] Other (See Comments)    Severe headaches  . Hydrocodone     REACTION: nausea and totally out of it  . Hydrocodone-Acetaminophen     REACTION: reaction forgeotten  . Ketoconazole     REACTION: terribly weak, voice shook  . Morphine And Related Nausea And Vomiting  . Oxycodone-Aspirin     REACTION: nausea  . Penicillins     REACTION: rash  . Sulfamethoxazole-Trimethoprim     REACTION: either rash or diarrhea  . Sulfonamide Derivatives  REACTION: reaction forgotten  . Hydromorphone Rash    History  Substance Use Topics  . Smoking status: Never Smoker   . Smokeless tobacco: Not on file     Comment: passive smoker from birth to age 43 (mom and husband)  . Alcohol Use: No    Family History  Problem Relation Age of Onset  . Heart disease Father   . Hypertension Father   . Colon cancer      grandmother     Review of Systems  All other systems reviewed and are negative.   Objective:  Physical Exam  Vital signs in last 24 hours:    Labs:   Estimated body mass index is 22.42 kg/(m^2) as calculated from the following:   Height as of 04/01/14: 5' (1.524 m).   Weight as of 04/01/14: 52.073 kg (114 lb 12.8 oz).   Imaging Review Plain radiographs demonstrate moderate degenerative joint disease of the left knee(s). The overall alignment ismild varus. The bone quality appears to be adequate for age and reported activity level.  Assessment/Plan:  End stage arthritis, left knee   The patient history, physical examination, clinical judgment of the provider and imaging studies are consistent with end stage degenerative joint disease of the left knee(s) and total knee arthroplasty is deemed medically necessary. The treatment options including medical management, injection therapy arthroscopy and arthroplasty were discussed at length. The risks and benefits of total knee arthroplasty were presented and reviewed. The risks due to aseptic loosening, infection,  stiffness, patella tracking problems, thromboembolic complications and other imponderables were discussed. The patient acknowledged the explanation, agreed to proceed with the plan and consent was signed. Patient is being admitted for inpatient treatment for surgery, pain control, PT, OT, prophylactic antibiotics, VTE prophylaxis, progressive ambulation and ADL's and discharge planning. The patient is planning to be discharged home with home health services

## 2014-04-29 NOTE — Progress Notes (Signed)
Orthopedic Tech Progress Note Patient Details:  Denise I Quayle 11/02/1932 [id number]  Ortho Devices Ortho Device/Splint Location: foot roll Ortho Device/Splint Interventions: Application   Cammer, Mickie Bail 04/29/2014, 11:10 AM

## 2014-04-29 NOTE — Op Note (Signed)
04/29/2014  9:46 AM  PATIENT:  [name]    PRE-OPERATIVE DIAGNOSIS:  Osteoarthritis left knee  POST-OPERATIVE DIAGNOSIS:  Same  PROCEDURE:  LEFT TOTAL KNEE ARTHROPLASTY  SURGEON:  Nadara Mustard, MD  PHYSICIAN ASSISTANT:None ANESTHESIA:   General  PREOPERATIVE INDICATIONS:  [name] is a  80 y.o. female with a diagnosis of Osteoarthritis left knee who failed conservative measures and elected for surgical management.    The risks benefits and alternatives were discussed with the patient preoperatively including but not limited to the risks of infection, bleeding, nerve injury, cardiopulmonary complications, the need for revision surgery, among others, and the patient was willing to proceed.  OPERATIVE IMPLANTS: Zimmer  Size C. tibia. Size 4 femur. 11 mm polyethylene tray. 29 mm patella  OPERATIVE FINDINGS: Sclerotic bone  OPERATIVE PROCEDURE: The patient is a 80 year old woman who presents for total knee arthroplasty after failure conservative care risks and benefits were discussed patient states she understands and wished to proceed at this time. Description of procedure patient was brought to the operating room and underwent a general anesthetic after femoral block. After adequate levels of anesthesia were obtained patient's left lower extremity was prepped using DuraPrep draped into a sterile field an [place] was used to cover all exposed skin. A midline incision was made carried down to the medial parapatellar retinacular incision. Intramedullary guide was used the femur take 10 mm off the distal femur. Extramedullary guide was then used to take 10 mm off the proximal tibia with neutral varus and valgus and a 3 posterior slope. The tibia was sized for a size C. and the keel punch was made for the size C. Attention was then focused on the femur and this sized for a size 4 and the box cut and chamfer cuts were made for the size 4 with 3 of external rotation. 6 of valgus. The  knee was trialed and was stable at 11 mm polyethylene tray. 10 mm was taken off the patella and a 29 mm patella was sized for the patella. The knee was irrigated with pulsatile lavage meniscus and loose material was removed. The popliteal fossa was injected with Exparel 60 cc. 20 cc diluted to 60 cc. The knee was irrigated with pulsatile lavage the components were cemented the polyethylene tray was placed and the knee was left in extension until the cement hardened. The patella was left clamped until the cement hardened. The knee was placed through full range of motion the knee patella tracked midline. The retinaculum was closed using #1 Vicryl subcutaneous is closed using 0 Vicryl the skin was closing staples. A sterile compressive dressing was applied. Patient was extubated taken to the PACU in stable condition.

## 2014-04-29 NOTE — Anesthesia Preprocedure Evaluation (Addendum)
Anesthesia Evaluation  Patient identified by MRN, date of birth, ID band Patient awake    Reviewed: Allergy & Precautions, H&P , NPO status , Patient's Chart, lab work & pertinent test results  History of Anesthesia Complications (+) PONV  Airway Mallampati: II TM Distance: >3 FB Neck ROM: Full    Dental  (+) Edentulous Upper, Edentulous Lower, Dental Advisory Given   Pulmonary shortness of breath, with exertion and at rest, asthma , pneumonia -, COPD breath sounds clear to auscultation        Cardiovascular hypertension, Pt. on medications and Pt. on home beta blockers + Peripheral Vascular Disease and +CHF + dysrhythmias + pacemaker + Cardiac Defibrillator Rhythm:Regular Rate:Normal     Neuro/Psych    GI/Hepatic GERD-  Medicated and Controlled,  Endo/Other  Hyperthyroidism   Renal/GU Renal disease     Musculoskeletal   Abdominal   Peds  Hematology  (+) anemia ,   Anesthesia Other Findings   Reproductive/Obstetrics                        Anesthesia Physical Anesthesia Plan  ASA: III  Anesthesia Plan: General   Post-op Pain Management:    Induction: Intravenous  Airway Management Planned: Oral ETT  Additional Equipment:   Intra-op Plan:   Post-operative Plan: Possible Post-op intubation/ventilation  Informed Consent: I have reviewed the patients History and Physical, chart, labs and discussed the procedure including the risks, benefits and alternatives for the proposed anesthesia with the patient or authorized representative who has indicated his/her understanding and acceptance.   Dental advisory given  Plan Discussed with: CRNA, Anesthesiologist and Surgeon  Anesthesia Plan Comments:        Anesthesia Quick Evaluation

## 2014-04-30 ENCOUNTER — Encounter (HOSPITAL_COMMUNITY): Payer: Self-pay | Admitting: Orthopedic Surgery

## 2014-04-30 LAB — BASIC METABOLIC PANEL
Anion gap: 10 (ref 5–15)
BUN: 20 mg/dL (ref 6–23)
CHLORIDE: 98 meq/L (ref 96–112)
CO2: 26 mEq/L (ref 19–32)
Calcium: 8.5 mg/dL (ref 8.4–10.5)
Creatinine, Ser: 1.14 mg/dL — ABNORMAL HIGH (ref 0.50–1.10)
GFR calc Af Amer: 52 mL/min — ABNORMAL LOW (ref >90)
GFR, EST NON AFRICAN AMERICAN: 45 mL/min — AB (ref >90)
Glucose, Bld: 98 mg/dL (ref 70–99)
POTASSIUM: 4.5 meq/L (ref 3.7–5.3)
SODIUM: 134 meq/L — AB (ref 137–147)

## 2014-04-30 LAB — CBC
HEMATOCRIT: 28.5 % — AB (ref 36.0–46.0)
Hemoglobin: 9.4 g/dL — ABNORMAL LOW (ref 12.0–15.0)
MCH: 29.1 pg (ref 26.0–34.0)
MCHC: 33 g/dL (ref 30.0–36.0)
MCV: 88.2 fL (ref 78.0–100.0)
Platelets: 145 10*3/uL — ABNORMAL LOW (ref 150–400)
RBC: 3.23 MIL/uL — ABNORMAL LOW (ref 3.87–5.11)
RDW: 14.5 % (ref 11.5–15.5)
WBC: 5.8 10*3/uL (ref 4.0–10.5)

## 2014-04-30 LAB — PROTIME-INR
INR: 1.24 (ref 0.00–1.49)
PROTHROMBIN TIME: 15.6 s — AB (ref 11.6–15.2)

## 2014-04-30 MED ORDER — KETOROLAC TROMETHAMINE 15 MG/ML IJ SOLN
INTRAMUSCULAR | Status: AC
  Administered 2014-04-30
  Filled 2014-04-30: qty 1

## 2014-04-30 MED ORDER — WARFARIN SODIUM 4 MG PO TABS
4.0000 mg | ORAL_TABLET | Freq: Once | ORAL | Status: DC
  Filled 2014-04-30: qty 1

## 2014-04-30 NOTE — Progress Notes (Signed)
Patient is having acute onset difficulty swallowing.  MD notified.  Swallow eval by speech therapy ordered.  Will keep patient NPO until otherwise cleared by speech.  Husband at bedside admits that he has seen this happen before with his wife.  Patient seems to be displaying episodes of memory impairment and questionable judgement intermittently.  No other neuro deficits noted.  Speech, motor, sensation are all intact.  Will continue to monitor.

## 2014-04-30 NOTE — Progress Notes (Signed)
Physical Therapy Treatment Patient Details Name: [name] MRN: [id number] DOB: 1933-01-05 Today's Date: 04/30/2014    History of Present Illness Pt is a 80 y.o. female s/p Lt TKA 04/29/14.    PT Comments    Patient making small progress this morning. Limited by dizziness but subsided with sitting. Will attempt long hall ambulation later this afternoon. Patient stating that she wants to return to ILF at [facility] and not the SNF part, will see how patient progressing this afternoon and make that call based on her progress  Follow Up Recommendations  Home health PT;Supervision/Assistance - 24 hour     Equipment Recommendations  Rolling walker with 5" wheels    Recommendations for Other Services       Precautions / Restrictions Precautions Precautions: Fall;Knee Restrictions LLE Weight Bearing: Weight bearing as tolerated    Mobility  Bed Mobility Overal bed mobility: Needs Assistance Bed Mobility: Supine to Sit     Supine to sit: Min assist     General bed mobility comments: cues for sequencing and relied heavily on handrails   Transfers Overall transfer level: Needs assistance Equipment used: Rolling walker (2 wheeled)   Sit to Stand: Min assist         General transfer comment: min (A) to elevate trunk to standing position; cues for hand placement and sequencing with RW  Ambulation/Gait Ambulation/Gait assistance: Min assist Ambulation Distance (Feet): 4 Feet Assistive device: Rolling walker (2 wheeled)   Gait velocity: decreased   General Gait Details: Patient able to take a couple small steps to recliner. Cues for step sequence. Limited by dizziness;subsided with sitting   Stairs            Wheelchair Mobility    Modified Rankin (Stroke Patients Only)       Balance                                    Cognition Arousal/Alertness: Awake/alert Behavior During Therapy: WFL for tasks assessed/performed Overall Cognitive  Status: Within Functional Limits for tasks assessed                      Exercises Total Joint Exercises Quad Sets: AROM;Left;10 reps Heel Slides: AAROM;Left;10 reps Hip ABduction/ADduction: AAROM;Left;10 reps Straight Leg Raises: AAROM;Left;10 reps    General Comments        Pertinent Vitals/Pain no apparent distress     Home Living                      Prior Function            PT Goals (current goals can now be found in the care plan section) Progress towards PT goals: Progressing toward goals    Frequency  7X/week    PT Plan Current plan remains appropriate    Co-evaluation             End of Session Equipment Utilized During Treatment: Gait belt Activity Tolerance: Patient limited by pain Patient left: in chair;with call bell/phone within reach     Time: 0755-0821 PT Time Calculation (min): 26 min  Charges:  $Gait Training: 8-22 mins $Therapeutic Exercise: 8-22 mins                    G Codes:      Fredrich Birks 04/30/2014, 9:21 AM 04/30/2014 Fredrich Birks PTA [phone] pager  [phone] office     

## 2014-04-30 NOTE — Progress Notes (Signed)
Patient ID: [name], female   DOB: Sep 08, 1933, 80 y.o.   MRN: [id number] Postoperative day 1 left total knee arthroplasty. Patient states she has no pain this morning. Plan for physical therapy progressive ambulation. Patient states she wants to go to the [facility] skilled nursing facility.

## 2014-04-30 NOTE — Progress Notes (Signed)
ANTICOAGULATION CONSULT NOTE - Follow Up Consult  Pharmacy Consult for Coumadin Indication: VTE prophylaxis  Allergies  Allergen Reactions  . Alprazolam     REACTION: ulcer's in mouth and extreme constipation  . Amitriptyline   . Aspirin     REACTION: upsets stomach  . Calcitonin ([place])     REACTION: rash over entire body  . Cefuroxime Axetil     REACTION: either rash and diarrhea  . Cephalexin     REACTION: rash  . Ciprofloxacin     REACTION: rash  . Clonazepam     REACTION: 1/2 pill makes grogginess next day  . Doxycycline     REACTION: severe rash over entire body  . Erythromycin     REACTION: rash  . Fulvicin P-G [Griseofulvin] Other (See Comments)    Severe headaches  . Hydrocodone     REACTION: nausea and totally out of it  . Hydrocodone-Acetaminophen     REACTION: reaction forgeotten  . Ketoconazole     REACTION: terribly weak, voice shook  . Morphine And Related Nausea And Vomiting  . Oxycodone-Aspirin     REACTION: nausea  . Penicillins     REACTION: rash  . Sulfamethoxazole-Trimethoprim     REACTION: either rash or diarrhea  . Sulfonamide Derivatives     REACTION: reaction forgotten  . Hydromorphone Rash    Patient Measurements: Height: 5' (152.4 cm) Weight: 114 lb (51.71 kg) IBW/kg (Calculated) : 45.5 Heparin Dosing Weight:   Vital Signs: Temp: 97.8 F (36.6 C) (08/06 0505) BP: 128/52 mmHg (08/06 0505) Pulse Rate: 57 (08/06 0505)  Labs:  Recent Labs  04/30/14 0629  HGB 9.4*  HCT 28.5*  PLT 145*  LABPROT 15.6*  INR 1.24  CREATININE 1.14*    Estimated Creatinine Clearance: 29.2 ml/min (by C-G formula based on Cr of 1.14).   Medications:  Scheduled:  . carvedilol  12.5 mg Oral BID  . docusate sodium  100 mg Oral BID  . ferrous sulfate  325 mg Oral QODAY  . fluconazole  150 mg Oral Weekly  . irbesartan  75 mg Oral Daily  . levothyroxine  75 mcg Oral QAC breakfast  . montelukast  10 mg Oral Daily  . nortriptyline  25 mg Oral  QHS  . pantoprazole  40 mg Oral Daily  . warfarin  4 mg Oral ONCE-1800  . Warfarin - Pharmacist Dosing Inpatient   Does not apply q1800    Assessment: 80yo female s/p L-TKA, now on COumadin for VTE px.  INR 1.24 this AM, a significant jump with the first dose.  Pt is on Fluconazole weekly which may inc Coumadin effect.  Hg 9.4 and pltc 145; no bleeding problems noted.  Goal of Therapy:  INR 2-3 Monitor platelets by anticoagulation protocol: Yes   Plan:  1-  Coumadin 4mg  2-  Daily INR  Marisue Humble, PharmD Clinical Pharmacist [facility]

## 2014-04-30 NOTE — Progress Notes (Signed)
Physical Therapy Treatment Patient Details Name: [name] MRN: [id number] DOB: [birth year] Today's Date: 04/30/2014    History of Present Illness [name] is a 80 y.o. Female s/p Lt TKA on 04/29/14. PMH of HTN, dizziness, COPD, Ischemic cardiomyopathy, Chronic systolic heart failure , S/P CABG, with Automatic implantable cardioverter-defibrillator in situ.     PT Comments    Patient able to progress with ambulation this session. Having concerns about her swallowing. Husband present and able to see patients progress. Continue with current POC  Follow Up Recommendations  Home health PT;Supervision/Assistance - 24 hour     Equipment Recommendations  Rolling walker with 5" wheels    Recommendations for Other Services       Precautions / Restrictions Precautions Precautions: Fall;Knee Restrictions LLE Weight Bearing: Weight bearing as tolerated    Mobility  Bed Mobility               General bed mobility comments: Pt sitting upright in recliner when PT arrived.   Transfers Overall transfer level: Needs assistance Equipment used: Rolling walker (2 wheeled)   Sit to Stand: Min assist         General transfer comment: Min (A) to stabilize RW and close guard for trunk elevation. VC's for hand placement and sequencing with RW.   Ambulation/Gait Ambulation/Gait assistance: Min assist Ambulation Distance (Feet): 20 Feet Assistive device: Rolling walker (2 wheeled) Gait Pattern/deviations: Step-through pattern;Decreased stride length Gait velocity: decreased   General Gait Details: Cues for gait sequence. A for RW management and turning   Stairs            Wheelchair Mobility    Modified Rankin (Stroke Patients Only)       Balance                                    Cognition Arousal/Alertness: Awake/alert Behavior During Therapy: WFL for tasks assessed/performed Overall Cognitive Status: Within Functional Limits for tasks  assessed                      Exercises Total Joint Exercises Quad Sets: AROM;Left;10 reps Heel Slides: AAROM;Left;10 reps Hip ABduction/ADduction: AAROM;Left;10 reps Straight Leg Raises: AAROM;Left;10 reps    General Comments        Pertinent Vitals/Pain Pain Assessment: 0-10 Pain Score: 4  Pain Descriptors / Indicators: Aching;Discomfort Pain Intervention(s): Repositioned;Limited activity within patient's tolerance    Home Living                      Prior Function            PT Goals (current goals can now be found in the care plan section) Progress towards PT goals: Progressing toward goals    Frequency  7X/week    PT Plan Current plan remains appropriate    Co-evaluation             End of Session Equipment Utilized During Treatment: Gait belt Activity Tolerance: Patient limited by pain;Patient tolerated treatment well Patient left: in chair;with call bell/phone within reach     Time: 1430-1454 PT Time Calculation (min): 24 min  Charges:  $Gait Training: 8-22 mins $Therapeutic Exercise: 8-22 mins                    G Codes:      Fredrich Birks 04/30/2014, 3:25 PM  04/30/2014  Jacqualyn Posey PTA [phone] pager [phone] office

## 2014-04-30 NOTE — Progress Notes (Signed)
Occupational Therapy Evaluation Patient Details Name: [name] MRN: [id number] DOB: 1933-07-17 Today's Date: 04/30/2014    History of Present Illness Chelsea I. Maurine MinisterDennis is a 80 y.o. Female s/p Lt TKA on 04/29/14. PMH of HTN, dizziness, COPD, Ischemic cardiomyopathy, Chronic systolic heart failure , S/P CABG, with Automatic implantable cardioverter-defibrillator in situ.    Clinical Impression   PTA pt lived at home with her husband and was independent with ADLs and functional mobility. Pt is limited today by light headedness and dizziness and was able to progress 2 steps from chair before requiring to be seated. Discussed at length with pt and husband OT's safety concerns and need for 24/7 assistance. At this time, strongly feel that pt would benefit from SNF for ST rehab to increase strength and endurance prior to return home. [facility] Pt would benefit from skilled OT to increase strength and functional mobility and for pt education regarding safety with ADLs.     Follow Up Recommendations  SNF vs Home health OT;Supervision/Assistance - 24 hour : TBD by pt's progress   Equipment Recommendations  3 in 1 bedside comode       Precautions / Restrictions Precautions Precautions: Fall;Knee Precaution Comments: Educated pt on incorporating precautions into ADLs.  Restrictions Weight Bearing Restrictions: Yes LLE Weight Bearing: Weight bearing as tolerated      Mobility Bed Mobility Overal bed mobility: Needs Assistance Bed Mobility: Supine to Sit     Supine to sit: Min assist     General bed mobility comments: Pt sitting upright in recliner when OT arrived.   Transfers Overall transfer level: Needs assistance Equipment used: Rolling walker (2 wheeled) Transfers: Sit to/from [employer] CorporationStand;Stand Pivot Transfers Sit to Stand: Min assist Stand pivot transfers: Min assist       General transfer comment: Min (A) to stabilize RW and close guard for  trunk elevation. VC's for hand placement and sequencing with RW.     Balance Overall balance assessment: Needs assistance Sitting-balance support: No upper extremity supported;Feet supported Sitting balance-Leahy Scale: Good     Standing balance support: Bilateral upper extremity supported;During functional activity Standing balance-Leahy Scale: Poor                              ADL Overall ADL's : Needs assistance/impaired Eating/Feeding: Independent;Sitting   Grooming: Set up;Sitting   Upper Body Bathing: Set up;Sitting   Lower Body Bathing: Moderate assistance;Sit to/from stand   Upper Body Dressing : Set up;Sitting   Lower Body Dressing: Maximal assistance;Sit to/from stand   Toilet Transfer: Minimal assistance;Stand-pivot;RW Toilet Transfer Details (indicate cue type and reason): pt able to take 2 steps and can stand-pivot, however becomes light headed very quickly when upright. Toileting- Clothing Manipulation and Hygiene: Minimal assistance;Sit to/from stand       Functional mobility during ADLs:  (pt unable to perform functional mobility due to light headed) General ADL Comments: Pt is limited by light headedness and dizziness this morning. Feel that pt will progress quickly with functional mobility when this is managed. Pt took 2 steps toward the bathroom then felt light headed and required to sit down. OT reclined pt and provided cool wash cloth on her neck/chest. Discussed safety concerns extensively with family as they feel adamantly about going home (to [facility]) with home health therapy, however pt's husband is unable to provide much physical assist.      Vision  Pt  reports no change in baseline. No apparent visual deficits.                    Perception Perception Perception Tested?: No   Praxis Praxis Praxis tested?: Within functional limits    Pertinent Vitals/Pain No c/o pain. Pt reports light headedness and dizziness upon standing, which  did not resolve. Pt had to sit down following 2 steps forward. No dynamap available to take BP reading in standing.      Hand Dominance Right   Extremity/Trunk Assessment Upper Extremity Assessment Upper Extremity Assessment: Generalized weakness   Lower Extremity Assessment Lower Extremity Assessment: Defer to PT evaluation   Cervical / Trunk Assessment Cervical / Trunk Assessment: Normal   Communication Communication Communication: No difficulties   Cognition Arousal/Alertness: Awake/alert Behavior During Therapy: WFL for tasks assessed/performed;Anxious Overall Cognitive Status: Within Functional Limits for tasks assessed       Memory: Decreased short-term memory                        Home Living Family/patient expects to be discharged to:: Private residence ([facility] [place] (for 3 years)) Living Arrangements: Spouse/significant other Available Help at Discharge: Family;Available 24 hours/day Type of Home: House Home Access: Level entry     Home Layout: One level     Bathroom Shower/Tub: Walk-in shower         Home Equipment: Crutches          Prior Functioning/Environment Level of Independence: Independent             OT Diagnosis: Generalized weakness;Acute pain   OT Problem List: Decreased strength;Decreased range of motion;Decreased activity tolerance;Impaired balance (sitting and/or standing);Decreased safety awareness;Decreased knowledge of use of DME or AE;Pain   OT Treatment/Interventions: Self-care/ADL training;Therapeutic exercise;Energy conservation;DME and/or AE instruction;Therapeutic activities;Patient/family education;Balance training    OT Goals(Current goals can be found in the care plan section) Acute Rehab OT Goals Patient Stated Goal: to go home OT Goal Formulation: With patient Time For Goal Achievement: 05/07/14 Potential to Achieve Goals: Good ADL Goals Pt Will Perform Grooming: with  supervision;standing Pt Will Transfer to Toilet: with supervision;ambulating;bedside commode Pt Will Perform Toileting - Clothing Manipulation and hygiene: with supervision;sit to/from stand Pt Will Perform Tub/Shower Transfer: Shower transfer;with supervision;ambulating;shower seat;rolling walker  OT Frequency: Min 2X/week   Barriers to D/C: Other (comment)  Pt's husband will be home to assist, however cannot provide level of physical assist required by pt. Additionally, pt's dizziness is limiting functional mobility and raises concerns for safety at home.           End of Session Equipment Utilized During Treatment: Gait belt;Rolling walker Nurse Communication: Other (comment) (pt light headed with standing limiting therapy)  Activity Tolerance: Treatment limited secondary to medical complications (Comment) (pt light headed and dizzy with standing upright) Patient left: in chair;with call bell/phone within reach;with family/visitor present   Time: 7711-6579 OT Time Calculation (min): 35 min Charges:  OT General Charges $OT Visit: 1 Procedure OT Evaluation $Initial OT Evaluation Tier I: 1 Procedure OT Treatments $Self Care/Home Management : 23-37 mins  Rae Lips [phone] 04/30/2014, 10:39 AM

## 2014-05-01 DIAGNOSIS — R269 Unspecified abnormalities of gait and mobility: Secondary | ICD-10-CM | POA: Diagnosis not present

## 2014-05-01 DIAGNOSIS — M25569 Pain in unspecified knee: Secondary | ICD-10-CM | POA: Diagnosis not present

## 2014-05-01 DIAGNOSIS — Z471 Aftercare following joint replacement surgery: Secondary | ICD-10-CM | POA: Diagnosis not present

## 2014-05-01 DIAGNOSIS — M6281 Muscle weakness (generalized): Secondary | ICD-10-CM | POA: Diagnosis not present

## 2014-05-01 LAB — CBC
HEMATOCRIT: 25.2 % — AB (ref 36.0–46.0)
Hemoglobin: 8.6 g/dL — ABNORMAL LOW (ref 12.0–15.0)
MCH: 29 pg (ref 26.0–34.0)
MCHC: 34.1 g/dL (ref 30.0–36.0)
MCV: 84.8 fL (ref 78.0–100.0)
Platelets: 168 10*3/uL (ref 150–400)
RBC: 2.97 MIL/uL — ABNORMAL LOW (ref 3.87–5.11)
RDW: 14.5 % (ref 11.5–15.5)
WBC: 8.8 10*3/uL (ref 4.0–10.5)

## 2014-05-01 LAB — PROTIME-INR
INR: 1.38 (ref 0.00–1.49)
PROTHROMBIN TIME: 17 s — AB (ref 11.6–15.2)

## 2014-05-01 MED ORDER — ACETAMINOPHEN-CODEINE #3 300-30 MG PO TABS: 1.0000 | ORAL_TABLET | ORAL | Status: DC | PRN

## 2014-05-01 NOTE — Progress Notes (Signed)
Physical Therapy Treatment Patient Details Name: [name] MRN: [id number] DOB: [birth year] Today's Date: 05/01/2014    History of Present Illness [name] is a 80 y.o. Female s/p Lt TKA on 04/29/14. PMH of HTN, dizziness, COPD, Ischemic cardiomyopathy, Chronic systolic heart failure , S/P CABG, with Automatic implantable cardioverter-defibrillator in situ.     PT Comments    Patient limited by fatigue. Did not press long ambulation as patient is planning to DC home later today  Follow Up Recommendations  Home health PT;Supervision/Assistance - 24 hour     Equipment Recommendations  Rolling walker with 5" wheels    Recommendations for Other Services       Precautions / Restrictions Precautions Precautions: Fall;Knee Restrictions LLE Weight Bearing: Weight bearing as tolerated    Mobility  Bed Mobility Overal bed mobility: Needs Assistance Bed Mobility: Sit to Supine     Supine to sit: Min assist Sit to supine: Min assist   General bed mobility comments: A for LLE back into bed and positioning  Transfers Overall transfer level: Needs assistance Equipment used: Rolling walker (2 wheeled) Transfers: Sit to/from Stand Sit to Stand: Supervision         General transfer comment: Cues for hand placement and to turn fully before sitting  Ambulation/Gait Ambulation/Gait assistance: Min guard Ambulation Distance (Feet): 40 Feet Assistive device: Rolling walker (2 wheeled) Gait Pattern/deviations: Step-through pattern;Decreased stride length Gait velocity: decreased   General Gait Details: Cues for gait sequence. Patient continues to require several standing rest breaks.    Stairs            Wheelchair Mobility    Modified Rankin (Stroke Patients Only)       Balance Overall balance assessment: Needs assistance   Sitting balance-Leahy Scale: Good     Standing balance support: No upper extremity supported;During functional activity Standing  balance-Leahy Scale: Fair                      Cognition Arousal/Alertness: Awake/alert Behavior During Therapy: WFL for tasks assessed/performed Overall Cognitive Status: Within Functional Limits for tasks assessed                      Exercises Total Joint Exercises Quad Sets: AROM;Left;10 reps Heel Slides: AAROM;Left;10 reps Hip ABduction/ADduction: AAROM;Left;10 reps Straight Leg Raises: AAROM;Left;10 reps Long Arc Quad: AAROM;Left;10 reps    General Comments        Pertinent Vitals/Pain Pain Assessment: No/denies pain Pain Score: 4  Pain Location: L knee Pain Descriptors / Indicators: Aching;Discomfort Pain Intervention(s): Limited activity within patient's tolerance    Home Living                      Prior Function            PT Goals (current goals can now be found in the care plan section) Acute Rehab PT Goals Patient Stated Goal: to go home Progress towards PT goals: Progressing toward goals    Frequency  7X/week    PT Plan Current plan remains appropriate    Co-evaluation             End of Session Equipment Utilized During Treatment: Gait belt Activity Tolerance: Patient limited by fatigue Patient left: with call bell/phone within reach;in bed     Time: 1250-1309 PT Time Calculation (min): 19 min  Charges:  $Gait Training: 8-22 mins $Therapeutic Exercise: 8-22 mins  G Codes:      Fredrich BirksRobinette, Siddhartha Hoback Elizabeth 05/01/2014, 1:13 PM 05/01/2014 Fredrich Birksobinette, Fendi Meinhardt Elizabeth PTA [phone] pager [phone] office

## 2014-05-01 NOTE — Progress Notes (Signed)
I have read and agree with this note.   Time in/out:1121-1149 Total time:27 minutes (2SC)  Ignacia Palma, OTR/L [phone]

## 2014-05-01 NOTE — Discharge Summary (Signed)
Physician Discharge Summary  Patient ID: [name] MRN: [id number] DOB/AGE: 1933/06/03 80 y.o.  Admit date: 04/29/2014 Discharge date: 05/01/2014  Admission Diagnoses: Osteoarthritis left knee  Discharge Diagnoses:  Active Problems:   Total knee replacement status   Discharged Condition: stable  Hospital Course: Patient's hospital course was essentially unremarkable. Patient complains of some difficulty with swallowing and this has been an ongoing problem at home. We will have a swallowing evaluation prior to discharge. Patient progressed well with therapy and was discharged to home in stable condition.  Consults: None  Significant Diagnostic Studies: labs: Routine labs  Treatments: surgery: See operative note  Discharge Exam: Blood pressure 119/39, pulse 68, temperature 97.4 F (36.3 C), temperature source Oral, resp. rate 16, height 5' (1.524 m), weight 51.71 kg (114 lb), SpO2 93.00%. Incision/Wound: clean and dry  Disposition: 01-Home or Self Care     Medication List    ASK your doctor about these medications       carvedilol 12.5 MG tablet  Commonly known as:  COREG  Take 1 tablet (12.5 mg total) by mouth 2 (two) times daily.     CoQ10 100 MG Caps  Take 100 mg by mouth daily.     ferrous sulfate 325 (65 FE) MG tablet  Take 325 mg by mouth every other day. Monday, Wednesday, and friday     fluconazole 150 MG tablet  Commonly known as:  DIFLUCAN  Take 150 mg by mouth once a week. Sunday     levothyroxine 75 MCG tablet  Commonly known as:  SYNTHROID, LEVOTHROID  Take 75 mcg by mouth daily before breakfast.     montelukast 10 MG tablet  Commonly known as:  SINGULAIR  Take 10 mg by mouth daily.     nortriptyline 25 MG capsule  Commonly known as:  PAMELOR  Take 25 mg by mouth at bedtime.     pantoprazole 40 MG tablet  Commonly known as:  PROTONIX  Take 40 mg by mouth daily.     valsartan 80 MG tablet  Commonly known as:  DIOVAN  Take 40 mg by mouth  daily.     Vitamin D3 2000 UNITS capsule  Take 2,000 Units by mouth daily.           Follow-up Information   Follow up with Tranell Wojtkiewicz V, MD In 2 weeks.   Specialty:  Orthopedic Surgery   Contact information:   [address] [place] [address] [phone]       Signed: Nadara Mustard 05/01/2014, 6:36 AM

## 2014-05-01 NOTE — Progress Notes (Signed)
Physical Therapy Treatment Patient Details Name: [name] MRN: [id number] DOB: [birth year] Today's Date: 05/01/2014    History of Present Illness [name] is a 80 y.o. Female s/p Lt TKA on 04/29/14. PMH of HTN, dizziness, COPD, Ischemic cardiomyopathy, Chronic systolic heart failure , S/P CABG, with Automatic implantable cardioverter-defibrillator in situ.     PT Comments    Patient appearing very sluggish this morning. Required a lot of encouragement to participate and progress with therapy. Patient has orders to DC today. Will need to see again to ensure safety and patient is moving well enough to DC home with husband  Follow Up Recommendations  Home health PT;Supervision/Assistance - 24 hour     Equipment Recommendations  Rolling walker with 5" wheels    Recommendations for Other Services       Precautions / Restrictions Precautions Precautions: Fall;Knee Restrictions Weight Bearing Restrictions: Yes LLE Weight Bearing: Weight bearing as tolerated    Mobility  Bed Mobility                  Transfers Overall transfer level: Needs assistance Equipment used: Rolling walker (2 wheeled)   Sit to Stand: Min guard         General transfer comment: Cues for hand placement and to turn fully before sitting  Ambulation/Gait Ambulation/Gait assistance: Min guard Ambulation Distance (Feet): 60 Feet Assistive device: Rolling walker (2 wheeled) Gait Pattern/deviations: Step-through pattern;Decreased stride length Gait velocity: decreased   General Gait Details: Cues for gait sequence. Patient required several standing rest breaks.    Stairs            Wheelchair Mobility    Modified Rankin (Stroke Patients Only)       Balance                                    Cognition Arousal/Alertness: Awake/alert Behavior During Therapy: WFL for tasks assessed/performed Overall Cognitive Status: Within Functional Limits for tasks  assessed                      Exercises Total Joint Exercises Quad Sets: AROM;Left;10 reps Heel Slides: AAROM;Left;10 reps Hip ABduction/ADduction: AAROM;Left;10 reps Straight Leg Raises: AAROM;Left;10 reps Long Arc Quad: AAROM;Left;10 reps    General Comments        Pertinent Vitals/Pain Pain Score: 4  Pain Location: L knee Pain Descriptors / Indicators: Aching;Discomfort Pain Intervention(s): Limited activity within patient's tolerance    Home Living                      Prior Function            PT Goals (current goals can now be found in the care plan section) Progress towards PT goals: Progressing toward goals    Frequency  7X/week    PT Plan Current plan remains appropriate    Co-evaluation             End of Session Equipment Utilized During Treatment: Gait belt Activity Tolerance: Patient limited by fatigue Patient left: in chair;with call bell/phone within reach     Time: 7681-1572 PT Time Calculation (min): 30 min  Charges:  $Gait Training: 8-22 mins $Therapeutic Exercise: 8-22 mins                    G Codes:      Fredrich Birks 05/01/2014,  9:58 AM 05/01/2014 Fredrich Birksobinette, Boubacar Lerette Elizabeth PTA [phone] pager [phone] office

## 2014-05-01 NOTE — Care Management Note (Signed)
CARE MANAGEMENT NOTE 05/01/2014  Patient:  [name], [name]   Account Number:  [id number]  Date Initiated:  05/01/2014  Documentation initiated by:  Vance Peper  Subjective/Objective Assessment:   80 yr old female s/p left total knee arthroplasty.     Action/Plan:   Case manager spoke with patient and her husband concerning therapy needs at discharge. They are returning to [facility] and she will receive therapy there. Has RW and 3in1.   Anticipated DC Date:  05/01/2014   Anticipated DC Plan:  HOME W HOME HEALTH SERVICES      DC Planning Services  CM consult      [facility] Choice  HOME HEALTH   Choice offered to / List presented to:  C-1 Patient   DME arranged  NA        HH arranged  HH-2 PT      HH agency  OTHER - SEE NOTE   Status of service:  Completed, signed off Medicare Important Message given?  NA - LOS <3 / Initial given by admissions (If response is "NO", the following Medicare IM given date fields will be blank) Date Medicare IM given:   Medicare IM given by:   Date Additional Medicare IM given:   Additional Medicare IM given by:    Discharge Disposition:  HOME W HOME HEALTH SERVICES  Per UR Regulation:  Reviewed for med. necessity/level of care/duration of stay  If discussed at Long Length of Stay Meetings, dates discussed:    Comments:  05/01/14 11:52am Vance Peper, RN BSN Case Manager Case manager contacted the therapy department at [facility] concerning Mrs. Peeler need for physical therapy. CM spoke with Darryl, instructed to fax order,OP note, D/C summary to them @ [phone]. Case manager informed patient and her husband that order has been sent to the therapy department.

## 2014-05-01 NOTE — Discharge Instructions (Signed)
May shower and get incision wet. Weightbearing as tolerated.

## 2014-05-01 NOTE — Progress Notes (Signed)
MSW received referral for SNF. Chart reviewed and have spoken with Rn Cm who indicates plans for d/c to home with home health. MSW will sign off at this time please re- consult if social work needs arise.  Derrell Lolling, MSW Clinical Social Worker [phone]

## 2014-05-01 NOTE — Progress Notes (Addendum)
Patient requested to have regular diet in AM. Explained to patient and husband that it would be safer to wait until swallow eval is done for her safety. Patient stated that she has difficulty with swallowing at times but it usually resolves within an hour or two. This time the patient stated that it took longer during this particular event. Will keep patient NPO until swallow eval is done.

## 2014-05-01 NOTE — Evaluation (Signed)
Clinical/Bedside Swallow Evaluation Patient Details  Name: [name] MRN: [id number] Date of Birth: 10/14/1932  Today's Date: 05/01/2014 Time: 0488-8916 SLP Time Calculation (min): 27 min  Past Medical History:  Past Medical History  Diagnosis Date  . Ischemic cardiomyopathy     severe. Left ventricular ejection fraction 20%.   . Chronic systolic heart failure     NYHA class II.  [name] Chronic pulmonary disease   . BBB (bundle branch block)     s/p BiV ICD implant  . Raynaud's syndrome   . Neuromuscular scoliosis of thoracolumbar region     type of scoliosis was not specified.   [name] DJD (degenerative joint disease), cervical   . DJD (degenerative joint disease), lumbar   . HTN (hypertension)   . Hyperthyroidism     following Graves disease  . Renal artery stenosis     Treated with angioplast in 1980 and 1987.   . S/P CABG (coronary artery bypass graft) April 2012  . FH: mitral valve repair     with 26 mm Edwards ring angioplasty,   . COPD (chronic obstructive pulmonary disease)   . Full dentures   . PONV (postoperative nausea and vomiting)   . CHF (congestive heart failure)   . Dizziness   . Automatic implantable cardioverter-defibrillator in situ   . Pacemaker   . Asthma   . Pneumonia     hx  . GERD (gastroesophageal reflux disease)   . Anemia     takes iron 3 days per week   Past Surgical History:  Past Surgical History  Procedure Laterality Date  . Total abdominal hysterectomy    . Sympathectomy    . Tonsillectomy    . Renal artery ballon dilation    . Rotator cuff repair      right and left  . Laminotomy/foraminotomy      with decompression of the L4 nerve root   . Cervical fusion      C5-6 and C6-7, C4-5 with titanium plates  . Umbilical hernia repair    . Wedge resection  2001    for the right upper lobe for Aspergillus treatement.  Dr. Edwyna Shell apprix 2001.  [name] Ptca      of bilateral renal arteries  . Cataract extraction    . Carpal tunnel release    .  Appendectomy    . Breast lumpectomy      left breast  . Renal artery ballon dilation      x2  . Precancerous growth      tops of ear removed. bilateral.   . Implantation of icd  2010    BiV ICD implant (SJM) by Dr Amil Amen 03/2009  . Repair extensor tendon  07/10/2012    Procedure: REPAIR EXTENSOR TENDON;  Surgeon: Nicki Reaper, MD;  Location: [facility];  Service: Orthopedics;  Laterality: Right;  METACARPAL PHALANGEAL REPLACEMENT ARTHROPLASTIES RIGHT INDEX, MIDDLE, AND RING FINGERS  CENTRALIZATION EXTENSOR TENDONS INDEX, MIDDLE,  AND RING FINGER   . Finger arthroplasty  07/10/2012    Procedure: FINGER ARTHROPLASTY;  Surgeon: Nicki Reaper, MD;  Location: [facility];  Service: Orthopedics;  Laterality: Right;  METACARPAL PHALANGEAL ARTHROPLASTIES RIGHT INDEX, MIDDLE, AND RING FINGERS *TWO BLUE LOOPS LEFT IN AS DRAINS*  . Esophageal manometry N/A 04/07/2013    Procedure: ESOPHAGEAL MANOMETRY (EM);  Surgeon: Charolett Bumpers, MD;  Location: [facility];  Service: Endoscopy;  Laterality: N/A;  . Carpometacarpel suspension plasty Right 11/12/2013  Procedure: SUSPENSION PLASTY RIGHT THUMB, TRAPEZIUM EXCISION;  Surgeon: Nicki Reaper, MD;  Location: [facility];  Service: Orthopedics;  Laterality: Right;  . Tendon transfer Right 11/12/2013    Procedure: RIGHT ABDUCTOR POLLICUS LONGUS TENDON TRANSFER;  Surgeon: Nicki Reaper, MD;  Location: [facility];  Service: Orthopedics;  Laterality: Right;  . Tonsillectomy    . Hemorrhoidectomy with hemorrhoid banding    . Facial cosmetic surgery    . Back surgery    . Eye surgery Bilateral     cataracts  . Hernia repair      umbilical  . Insert / replace / remove pacemaker      icd/pacer  . Lung mass removal Right   . Left knee arthroscopic    . Coronary artery bypass graft  2010    mvr/cabg  . Total knee arthroplasty Left 04/29/2014    Procedure: LEFT TOTAL KNEE ARTHROPLASTY;  Surgeon: Nadara Mustard, MD;  Location: [facility];  Service: Orthopedics;  Laterality: Left;   HPI:  [name] is a 80 y.o. Female s/p Lt TKA on 04/29/14. PMH of HTN, dizziness, COPD, Ischemic cardiomyopathy, Chronic systolic heart failure , S/P CABG, with Automatic implantable cardioverter-defibrillator in situ.    Assessment / Plan / Recommendation Clinical Impression  Pt presents with a suspected primary esophageal dysphagia, given subjective report of globus sensation and h/o esophageal issues per barium swallow report 2012. Pt did not demonstrate overt signs of aspiration throughout trials with SLP. Recommend to continue soft solids with general aspiration and esophageal precaution. Recommend to consider esophageal assessment, possibly on an outpatient basis as pt is scheduled to d/c today.    Aspiration Risk  Mild    Diet Recommendation Dysphagia 3 (Mechanical Soft);Thin liquid   Liquid Administration via: Cup;Straw Medication Administration: Whole meds with liquid Supervision: Patient able to self feed;Intermittent supervision to cue for compensatory strategies Compensations: Slow rate;Small sips/bites;Follow solids with liquid Postural Changes and/or Swallow Maneuvers: Seated upright 90 degrees;Upright 30-60 min after meal    Other  Recommendations Oral Care Recommendations: Oral care BID   Follow Up Recommendations  None    Frequency and Duration        Pertinent Vitals/Pain n/a    SLP Swallow Goals     Swallow Study Prior Functional Status       General Date of Onset:  (chronic in nature) HPI: [name] is a 80 y.o. Female s/p Lt TKA on 04/29/14. PMH of HTN, dizziness, COPD, Ischemic cardiomyopathy, Chronic systolic heart failure , S/P CABG, with Automatic implantable cardioverter-defibrillator in situ.  Type of Study: Bedside swallow evaluation Previous Swallow Assessment: Pt had a barium swallow in 2012 wtih evidence of moderate reflux, mild tertiary contractions, and possible  hiatal hernia Diet Prior to this Study: Thin liquids;Dysphagia 3 (soft) Temperature Spikes Noted: No Respiratory Status: Room air History of Recent Intubation: No Behavior/Cognition: Alert;Pleasant mood;Cooperative Oral Cavity - Dentition: Adequate natural dentition Self-Feeding Abilities: Able to feed self Patient Positioning: Other (comment) (sitting upright EOB) Baseline Vocal Quality: Clear    Oral/Motor/Sensory Function     Ice Chips Ice chips: Not tested   Thin Liquid Thin Liquid: Impaired Presentation: Self Fed;Straw Pharyngeal  Phase Impairments: Other (comments) (audible swallow)    Nectar Thick Nectar Thick Liquid: Not tested   Honey Thick Honey Thick Liquid: Not tested   Puree Puree: Within functional limits Presentation: Self Fed;Spoon   Solid   GO    Solid: Within functional  limits Presentation: Self Fed        [name], [name] [phone]  [name], [name] 05/01/2014,11:22 AM

## 2014-05-01 NOTE — Progress Notes (Signed)
Occupational Therapy Treatment and Discharge Patient Details Name: [name] MRN: [id number] DOB: 1932-11-29 Today's Date: 05/01/2014    History of present illness [name] is a 80 y.o. Female s/p Lt TKA on 04/29/14. PMH of HTN, dizziness, COPD, Ischemic cardiomyopathy, Chronic systolic heart failure , S/P CABG, with Automatic implantable cardioverter-defibrillator in situ.    OT comments  Focus of session was on LB ADLs and practicing a shower transfer. Pt demonstrated understanding of shower transfer technique and husband will be with her 24/7 for supervision and assistance as needed for ADLs at home. All goals were met and education completed, therefore no further OT needed. We will sign off.  Follow Up Recommendations  Supervision/Assistance - 24 hour   Equipment Recommendations  3 in 1 bedside comode       Precautions / Restrictions Precautions Precautions: Fall;Knee Restrictions LLE Weight Bearing: Weight bearing as tolerated       Mobility Bed Mobility Overal bed mobility: Needs Assistance Bed Mobility: Supine to Sit     Supine to sit: Min assist     General bed mobility comments: for LLE only, pt able to bring trunk to upright position without physical assistance.  Transfers Overall transfer level: Needs assistance Equipment used: Rolling walker (2 wheeled) Transfers: Sit to/from Stand Sit to Stand: Supervision         General transfer comment: Cues for hand placement and to turn fully before sitting    Balance Overall balance assessment: Needs assistance   Sitting balance-Leahy Scale: Good     Standing balance support: No upper extremity supported;During functional activity Standing balance-Leahy Scale: Fair                     ADL Overall ADL's : Needs assistance/impaired     Grooming:  (simulated through standing for extended length of time throughout tx)           Upper Body Dressing : Set up;Sitting   Lower Body Dressing:  Sit to/from stand;Supervision/safety;Set up   Toilet Transfer: Stand-pivot;RW;Supervision/safety (simulated through sit>stand transfer from bed to chair)   [place] and Hygiene: Supervision/safety;Sit to/from stand (able to stand and pull pants up )   Tub/ Shower Transfer: Walk-in shower;Supervision/safety;Shower seat;Rolling walker   Functional mobility during ADLs: Supervision/safety;Rolling walker General ADL Comments: Pt demonstrated understanding of LB ADL technique/sequence, shower transfer to built-in shower seat and simulated a safe toilet transfer through functional mobility throughout session. Pt and husband feel confident with self care tasks at home returning to their independent living home.                Cognition   Behavior During Therapy: WFL for tasks assessed/performed Overall Cognitive Status: Within Functional Limits for tasks assessed                                    Pertinent Vitals/ Pain       Pain Assessment: No/denies pain          Frequency Min 2X/week     Progress Toward Goals  OT Goals(current goals can now be found in the care plan section)  Progress towards OT goals: Goals met/education completed, patient discharged from OT  Acute Rehab OT Goals Patient Stated Goal: to go home OT Goal Formulation: With patient Time For Goal Achievement: 05/07/14 Potential to Achieve Goals: Good  Plan Discharge plan remains appropriate  End of Session Equipment Utilized During Treatment: Rolling walker   Activity Tolerance Patient tolerated treatment well   Patient Left in chair;with call bell/phone within reach;with family/visitor present   Nurse Communication          Time:  -     Charges:    Lyda Perone 05/01/2014, 12:16 PM

## 2014-05-01 NOTE — Progress Notes (Signed)
Patient ID: [name], female   DOB: [birth year], 80 y.o.   MRN: [id number] Patient is comfortable this morning. Will plan to discharge to home after swallow evaluation. Patient is written for a soft diet.

## 2014-05-03 DIAGNOSIS — R269 Unspecified abnormalities of gait and mobility: Secondary | ICD-10-CM | POA: Diagnosis not present

## 2014-05-03 DIAGNOSIS — Z471 Aftercare following joint replacement surgery: Secondary | ICD-10-CM | POA: Diagnosis not present

## 2014-05-03 DIAGNOSIS — M25569 Pain in unspecified knee: Secondary | ICD-10-CM | POA: Diagnosis not present

## 2014-05-03 DIAGNOSIS — M6281 Muscle weakness (generalized): Secondary | ICD-10-CM | POA: Diagnosis not present

## 2014-05-04 DIAGNOSIS — R269 Unspecified abnormalities of gait and mobility: Secondary | ICD-10-CM | POA: Diagnosis not present

## 2014-05-04 DIAGNOSIS — M6281 Muscle weakness (generalized): Secondary | ICD-10-CM | POA: Diagnosis not present

## 2014-05-04 DIAGNOSIS — Z471 Aftercare following joint replacement surgery: Secondary | ICD-10-CM | POA: Diagnosis not present

## 2014-05-04 DIAGNOSIS — M25569 Pain in unspecified knee: Secondary | ICD-10-CM | POA: Diagnosis not present

## 2014-05-06 DIAGNOSIS — M25569 Pain in unspecified knee: Secondary | ICD-10-CM | POA: Diagnosis not present

## 2014-05-06 DIAGNOSIS — R269 Unspecified abnormalities of gait and mobility: Secondary | ICD-10-CM | POA: Diagnosis not present

## 2014-05-06 DIAGNOSIS — M6281 Muscle weakness (generalized): Secondary | ICD-10-CM | POA: Diagnosis not present

## 2014-05-06 DIAGNOSIS — Z471 Aftercare following joint replacement surgery: Secondary | ICD-10-CM | POA: Diagnosis not present

## 2014-05-07 DIAGNOSIS — Z471 Aftercare following joint replacement surgery: Secondary | ICD-10-CM | POA: Diagnosis not present

## 2014-05-07 DIAGNOSIS — M6281 Muscle weakness (generalized): Secondary | ICD-10-CM | POA: Diagnosis not present

## 2014-05-07 DIAGNOSIS — R269 Unspecified abnormalities of gait and mobility: Secondary | ICD-10-CM | POA: Diagnosis not present

## 2014-05-07 DIAGNOSIS — M25569 Pain in unspecified knee: Secondary | ICD-10-CM | POA: Diagnosis not present

## 2014-05-08 DIAGNOSIS — Z471 Aftercare following joint replacement surgery: Secondary | ICD-10-CM | POA: Diagnosis not present

## 2014-05-08 DIAGNOSIS — R269 Unspecified abnormalities of gait and mobility: Secondary | ICD-10-CM | POA: Diagnosis not present

## 2014-05-08 DIAGNOSIS — M25569 Pain in unspecified knee: Secondary | ICD-10-CM | POA: Diagnosis not present

## 2014-05-08 DIAGNOSIS — M6281 Muscle weakness (generalized): Secondary | ICD-10-CM | POA: Diagnosis not present

## 2014-05-10 DIAGNOSIS — M25569 Pain in unspecified knee: Secondary | ICD-10-CM | POA: Diagnosis not present

## 2014-05-10 DIAGNOSIS — M6281 Muscle weakness (generalized): Secondary | ICD-10-CM | POA: Diagnosis not present

## 2014-05-10 DIAGNOSIS — R269 Unspecified abnormalities of gait and mobility: Secondary | ICD-10-CM | POA: Diagnosis not present

## 2014-05-10 DIAGNOSIS — Z471 Aftercare following joint replacement surgery: Secondary | ICD-10-CM | POA: Diagnosis not present

## 2014-05-12 DIAGNOSIS — M25569 Pain in unspecified knee: Secondary | ICD-10-CM | POA: Diagnosis not present

## 2014-05-12 DIAGNOSIS — R269 Unspecified abnormalities of gait and mobility: Secondary | ICD-10-CM | POA: Diagnosis not present

## 2014-05-12 DIAGNOSIS — M6281 Muscle weakness (generalized): Secondary | ICD-10-CM | POA: Diagnosis not present

## 2014-05-12 DIAGNOSIS — Z471 Aftercare following joint replacement surgery: Secondary | ICD-10-CM | POA: Diagnosis not present

## 2014-05-13 DIAGNOSIS — S8000XA Contusion of unspecified knee, initial encounter: Secondary | ICD-10-CM | POA: Diagnosis not present

## 2014-05-14 ENCOUNTER — Emergency Department (HOSPITAL_COMMUNITY): Payer: Medicare Other

## 2014-05-14 ENCOUNTER — Emergency Department (HOSPITAL_COMMUNITY)
Admission: EM | Admit: 2014-05-14 | Discharge: 2014-05-14 | Disposition: A | Discharge status: Home / Self Care | Payer: Medicare Other | Attending: Emergency Medicine | Admitting: Emergency Medicine

## 2014-05-14 ENCOUNTER — Encounter (HOSPITAL_COMMUNITY): Payer: Self-pay | Admitting: Emergency Medicine

## 2014-05-14 DIAGNOSIS — R269 Unspecified abnormalities of gait and mobility: Secondary | ICD-10-CM | POA: Diagnosis not present

## 2014-05-14 DIAGNOSIS — Z9889 Other specified postprocedural states: Secondary | ICD-10-CM | POA: Diagnosis not present

## 2014-05-14 DIAGNOSIS — Z951 Presence of aortocoronary bypass graft: Secondary | ICD-10-CM | POA: Insufficient documentation

## 2014-05-14 DIAGNOSIS — I5022 Chronic systolic (congestive) heart failure: Secondary | ICD-10-CM | POA: Insufficient documentation

## 2014-05-14 DIAGNOSIS — E0590 Thyrotoxicosis, unspecified without thyrotoxic crisis or storm: Secondary | ICD-10-CM | POA: Diagnosis not present

## 2014-05-14 DIAGNOSIS — K219 Gastro-esophageal reflux disease without esophagitis: Secondary | ICD-10-CM | POA: Diagnosis not present

## 2014-05-14 DIAGNOSIS — Z8701 Personal history of pneumonia (recurrent): Secondary | ICD-10-CM | POA: Diagnosis not present

## 2014-05-14 DIAGNOSIS — D649 Anemia, unspecified: Secondary | ICD-10-CM | POA: Diagnosis not present

## 2014-05-14 DIAGNOSIS — I7300 Raynaud's syndrome without gangrene: Secondary | ICD-10-CM | POA: Diagnosis not present

## 2014-05-14 DIAGNOSIS — M6281 Muscle weakness (generalized): Secondary | ICD-10-CM | POA: Diagnosis not present

## 2014-05-14 DIAGNOSIS — R0600 Dyspnea, unspecified: Secondary | ICD-10-CM

## 2014-05-14 DIAGNOSIS — Z880 Allergy status to penicillin: Secondary | ICD-10-CM | POA: Diagnosis not present

## 2014-05-14 DIAGNOSIS — R0602 Shortness of breath: Secondary | ICD-10-CM | POA: Diagnosis not present

## 2014-05-14 DIAGNOSIS — Z79899 Other long term (current) drug therapy: Secondary | ICD-10-CM | POA: Diagnosis not present

## 2014-05-14 DIAGNOSIS — I10 Essential (primary) hypertension: Secondary | ICD-10-CM | POA: Insufficient documentation

## 2014-05-14 DIAGNOSIS — M25569 Pain in unspecified knee: Secondary | ICD-10-CM | POA: Diagnosis not present

## 2014-05-14 DIAGNOSIS — J45901 Unspecified asthma with (acute) exacerbation: Secondary | ICD-10-CM | POA: Diagnosis not present

## 2014-05-14 DIAGNOSIS — J984 Other disorders of lung: Secondary | ICD-10-CM | POA: Diagnosis not present

## 2014-05-14 DIAGNOSIS — Z95810 Presence of automatic (implantable) cardiac defibrillator: Secondary | ICD-10-CM | POA: Diagnosis not present

## 2014-05-14 DIAGNOSIS — Z471 Aftercare following joint replacement surgery: Secondary | ICD-10-CM | POA: Diagnosis not present

## 2014-05-14 LAB — TROPONIN I: Troponin I: <0.3 ng/mL (ref <0.30)

## 2014-05-14 LAB — BASIC METABOLIC PANEL
ANION GAP: 12 (ref 5–15)
BUN: 16 mg/dL (ref 6–23)
CHLORIDE: 97 meq/L (ref 96–112)
CO2: 23 mEq/L (ref 19–32)
CREATININE: 1.07 mg/dL (ref 0.50–1.10)
Calcium: 9.5 mg/dL (ref 8.4–10.5)
GFR calc non Af Amer: 48 mL/min — ABNORMAL LOW (ref >90)
GFR, EST AFRICAN AMERICAN: 56 mL/min — AB (ref >90)
Glucose, Bld: 160 mg/dL — ABNORMAL HIGH (ref 70–99)
POTASSIUM: 4.4 meq/L (ref 3.7–5.3)
Sodium: 132 mEq/L — ABNORMAL LOW (ref 137–147)

## 2014-05-14 LAB — CBC
HCT: 33.3 % — ABNORMAL LOW (ref 36.0–46.0)
Hemoglobin: 10.8 g/dL — ABNORMAL LOW (ref 12.0–15.0)
MCH: 29 pg (ref 26.0–34.0)
MCHC: 32.4 g/dL (ref 30.0–36.0)
MCV: 89.5 fL (ref 78.0–100.0)
PLATELETS: 338 10*3/uL (ref 150–400)
RBC: 3.72 MIL/uL — AB (ref 3.87–5.11)
RDW: 16.7 % — ABNORMAL HIGH (ref 11.5–15.5)
WBC: 6.4 10*3/uL (ref 4.0–10.5)

## 2014-05-14 LAB — URINALYSIS, ROUTINE W REFLEX MICROSCOPIC
Glucose, UA: NEGATIVE mg/dL
HGB URINE DIPSTICK: NEGATIVE
Ketones, ur: NEGATIVE mg/dL
Nitrite: NEGATIVE
Protein, ur: NEGATIVE mg/dL
Specific Gravity, Urine: 1.019 (ref 1.005–1.030)
UROBILINOGEN UA: 1 mg/dL (ref 0.0–1.0)
pH: 6 (ref 5.0–8.0)

## 2014-05-14 LAB — URINE MICROSCOPIC-ADD ON

## 2014-05-14 LAB — PRO B NATRIURETIC PEPTIDE: Pro B Natriuretic peptide (BNP): 1346 pg/mL — ABNORMAL HIGH (ref 0–450)

## 2014-05-14 MED ORDER — IOHEXOL 350 MG/ML SOLN
100.0000 mL | Freq: Once | INTRAVENOUS | Status: AC | PRN
  Administered 2014-05-14: 100 mL via INTRAVENOUS

## 2014-05-14 MED ORDER — FUROSEMIDE 10 MG/ML IJ SOLN
40.0000 mg | Freq: Once | INTRAMUSCULAR | Status: AC
  Administered 2014-05-14: 40 mg via INTRAVENOUS
  Filled 2014-05-14: qty 4

## 2014-05-14 NOTE — ED Notes (Signed)
Pt back from CT

## 2014-05-14 NOTE — Discharge Instructions (Signed)
Followup with your Dr. next week return here for any trouble breathing Shortness of Breath Shortness of breath means you have trouble breathing. It could also mean that you have a medical problem. You should get immediate medical care for shortness of breath. CAUSES   Not enough oxygen in the air such as with high altitudes or a smoke-filled room.  Certain lung diseases, infections, or problems.  Heart disease or conditions, such as angina or heart failure.  Low red blood cells (anemia).  Poor physical fitness, which can cause shortness of breath when you exercise.  Chest or back injuries or stiffness.  Being overweight.  Smoking.  Anxiety, which can make you feel like you are not getting enough air. DIAGNOSIS  Serious medical problems can often be found during your physical exam. Tests may also be done to determine why you are having shortness of breath. Tests may include:  Chest X-rays.  Lung function tests.  Blood tests.  An electrocardiogram (ECG).  An ambulatory electrocardiogram. An ambulatory ECG records your heartbeat patterns over a 24-hour period.  Exercise testing.  A transthoracic echocardiogram (TTE). During echocardiography, sound waves are used to evaluate how blood flows through your heart.  A transesophageal echocardiogram (TEE).  Imaging scans. Your health care provider may not be able to find a cause for your shortness of breath after your exam. In this case, it is important to have a follow-up exam with your health care provider as directed.  TREATMENT  Treatment for shortness of breath depends on the cause of your symptoms and can vary greatly. HOME CARE INSTRUCTIONS   Do not smoke. Smoking is a common cause of shortness of breath. If you smoke, ask for help to quit.  Avoid being around chemicals or things that may bother your breathing, such as paint fumes and dust.  Rest as needed. Slowly resume your usual activities.  If medicines were  prescribed, take them as directed for the full length of time directed. This includes oxygen and any inhaled medicines.  Keep all follow-up appointments as directed by your health care provider. SEEK MEDICAL CARE IF:   Your condition does not improve in the time expected.  You have a hard time doing your normal activities even with rest.  You have any new symptoms. SEEK IMMEDIATE MEDICAL CARE IF:   Your shortness of breath gets worse.  You feel light-headed, faint, or develop a cough not controlled with medicines.  You start coughing up blood.  You have pain with breathing.  You have chest pain or pain in your arms, shoulders, or abdomen.  You have a fever.  You are unable to walk up stairs or exercise the way you normally do. MAKE SURE YOU:  Understand these instructions.  Will watch your condition.  Will get help right away if you are not doing well or get worse. Document Released: 06/06/2001 Document Revised: 09/16/2013 Document Reviewed: 11/27/2011 [facility] Patient Information 2015 [place], [place]. This information is not intended to replace advice given to you by your health care provider. Make sure you discuss any questions you have with your health care provider.

## 2014-05-14 NOTE — ED Provider Notes (Signed)
CSN: [id number]     Arrival date & time 05/14/14  1414 History   First MD Initiated Contact with Patient 05/14/14 1458     Chief Complaint  Patient presents with  . Shortness of Breath  . postop knee 8/5      (Consider location/radiation/quality/duration/timing/severity/associated sxs/prior Treatment) HPI Comments: Pt had left knee replacement 2 weeks ago, denies new swelling  H/o chf and is unsure if this is similar--  Patient is a 80 y.o. female presenting with shortness of breath. The history is provided by the patient and the spouse.  Shortness of Breath Severity:  Moderate Onset quality:  Gradual Duration:  7 days Timing:  Constant Progression:  Worsening Chronicity:  New Context: activity   Relieved by:  Rest Worsened by:  Activity Ineffective treatments:  None tried Associated symptoms: no chest pain, no fever, no hemoptysis and no wheezing   Risk factors: recent surgery     Past Medical History  Diagnosis Date  . Ischemic cardiomyopathy     severe. Left ventricular ejection fraction 20%.   . Chronic systolic heart failure     NYHA class II.  [name] Chronic pulmonary disease   . BBB (bundle branch block)     s/p BiV ICD implant  . Raynaud's syndrome   . Neuromuscular scoliosis of thoracolumbar region     type of scoliosis was not specified.   [name] DJD (degenerative joint disease), cervical   . DJD (degenerative joint disease), lumbar   . HTN (hypertension)   . Hyperthyroidism     following Graves disease  . Renal artery stenosis     Treated with angioplast in 1980 and 1987.   . S/P CABG (coronary artery bypass graft) April 2012  . FH: mitral valve repair     with 26 mm Edwards ring angioplasty,   . COPD (chronic obstructive pulmonary disease)   . Full dentures   . PONV (postoperative nausea and vomiting)   . CHF (congestive heart failure)   . Dizziness   . Automatic implantable cardioverter-defibrillator in situ   . Pacemaker   . Asthma   . Pneumonia     hx   . GERD (gastroesophageal reflux disease)   . Anemia     takes iron 3 days per week   Past Surgical History  Procedure Laterality Date  . Total abdominal hysterectomy    . Sympathectomy    . Tonsillectomy    . Renal artery ballon dilation    . Rotator cuff repair      right and left  . Laminotomy/foraminotomy      with decompression of the L4 nerve root   . Cervical fusion      C5-6 and C6-7, C4-5 with titanium plates  . Umbilical hernia repair    . Wedge resection  2001    for the right upper lobe for Aspergillus treatement.  Dr. Edwyna Shell apprix 2001.  [name] Ptca      of bilateral renal arteries  . Cataract extraction    . Carpal tunnel release    . Appendectomy    . Breast lumpectomy      left breast  . Renal artery ballon dilation      x2  . Precancerous growth      tops of ear removed. bilateral.   . Implantation of icd  2010    BiV ICD implant (SJM) by Dr Amil Amen 03/2009, model number [id number], serial number [id number], st jude rep [place], call [phone]  . Repair  extensor tendon  07/10/2012    Procedure: REPAIR EXTENSOR TENDON;  Surgeon: Nicki ReaperGary R Kuzma, MD;  Location: [facility];  Service: Orthopedics;  Laterality: Right;  METACARPAL PHALANGEAL REPLACEMENT ARTHROPLASTIES RIGHT INDEX, MIDDLE, AND RING FINGERS  CENTRALIZATION EXTENSOR TENDONS INDEX, MIDDLE,  AND RING FINGER   . Finger arthroplasty  07/10/2012    Procedure: FINGER ARTHROPLASTY;  Surgeon: Nicki ReaperGary R Kuzma, MD;  Location: [facility];  Service: Orthopedics;  Laterality: Right;  METACARPAL PHALANGEAL ARTHROPLASTIES RIGHT INDEX, MIDDLE, AND RING FINGERS *TWO BLUE LOOPS LEFT IN AS DRAINS*  . Esophageal manometry N/A 04/07/2013    Procedure: ESOPHAGEAL MANOMETRY (EM);  Surgeon: Charolett BumpersMartin K Johnson, MD;  Location: [facility];  Service: Endoscopy;  Laterality: N/A;  . Carpometacarpel suspension plasty Right 11/12/2013    Procedure: SUSPENSION PLASTY RIGHT THUMB, TRAPEZIUM EXCISION;  Surgeon:  Nicki ReaperGary R Kuzma, MD;  Location: [facility];  Service: Orthopedics;  Laterality: Right;  . Tendon transfer Right 11/12/2013    Procedure: RIGHT ABDUCTOR POLLICUS LONGUS TENDON TRANSFER;  Surgeon: Nicki ReaperGary R Kuzma, MD;  Location: [facility];  Service: Orthopedics;  Laterality: Right;  . Tonsillectomy    . Hemorrhoidectomy with hemorrhoid banding    . Facial cosmetic surgery    . Back surgery    . Eye surgery Bilateral     cataracts  . Hernia repair      umbilical  . Insert / replace / remove pacemaker      icd/pacer  . Lung mass removal Right   . Left knee arthroscopic    . Coronary artery bypass graft  2010    mvr/cabg  . Total knee arthroplasty Left 04/29/2014    Procedure: LEFT TOTAL KNEE ARTHROPLASTY;  Surgeon: Nadara MustardMarcus Duda V, MD;  Location: [facility];  Service: Orthopedics;  Laterality: Left;   Family History  Problem Relation Age of Onset  . Heart disease Father   . Hypertension Father   . Colon cancer      grandmother   History  Substance Use Topics  . Smoking status: Never Smoker   . Smokeless tobacco: Not on file     Comment: passive smoker from birth to age 80 (mom and husband)  . Alcohol Use: No   OB History   Grav Para Term Preterm Abortions TAB SAB Ect Mult Living                 Review of Systems  Constitutional: Negative for fever.  Respiratory: Positive for shortness of breath. Negative for hemoptysis and wheezing.   Cardiovascular: Negative for chest pain.  All other systems reviewed and are negative.     Allergies  Alprazolam; Amitriptyline; Aspirin; Calcitonin (salmon); Cefuroxime axetil; Cephalexin; Ciprofloxacin; Clonazepam; Doxycycline; Erythromycin; Fulvicin p-g; Hydrocodone; Hydrocodone-acetaminophen; Ketoconazole; Morphine and related; Oxycodone-aspirin; Penicillins; Sulfamethoxazole-trimethoprim; Sulfonamide derivatives; and Hydromorphone  Home Medications   Prior to Admission medications   Medication Sig Start Date End Date  Taking? Authorizing Provider  acetaminophen-codeine (TYLENOL #3) 300-30 MG per tablet Take 1-2 tablets by mouth every 4 (four) hours as needed for moderate pain. 05/01/14   Nadara MustardMarcus Duda V, MD  carvedilol (COREG) 12.5 MG tablet Take 1 tablet (12.5 mg total) by mouth 2 (two) times daily. 04/01/14   Donato SchultzMark Skains, MD  Cholecalciferol (VITAMIN D3) 2000 UNITS capsule Take 2,000 Units by mouth daily.     Historical Provider, MD  Coenzyme Q10 (COQ10) 100 MG CAPS Take 100 mg by mouth daily.     Historical Provider, MD  ferrous sulfate 325 (65 FE) MG tablet Take 325 mg by mouth every other day. Monday, Wednesday, and friday    Historical Provider, MD  fluconazole (DIFLUCAN) 150 MG tablet Take 150 mg by mouth once a week. Sunday 03/04/14   Historical Provider, MD  levothyroxine (SYNTHROID, LEVOTHROID) 75 MCG tablet Take 75 mcg by mouth daily before breakfast.    Historical Provider, MD  montelukast (SINGULAIR) 10 MG tablet Take 10 mg by mouth daily.     Historical Provider, MD  nortriptyline (PAMELOR) 25 MG capsule Take 25 mg by mouth at bedtime.     Historical Provider, MD  pantoprazole (PROTONIX) 40 MG tablet Take 40 mg by mouth daily.  02/26/12   Historical Provider, MD  valsartan (DIOVAN) 80 MG tablet Take 40 mg by mouth daily.    Historical Provider, MD   BP 122/62  Pulse 71  Temp(Src) 98.3 F (36.8 C) (Oral)  Resp 20  SpO2 95% Physical Exam  Nursing note and vitals reviewed. Constitutional: She is oriented to person, place, and time. She appears well-developed and well-nourished.  Non-toxic appearance. No distress.  HENT:  Head: Normocephalic and atraumatic.  Eyes: Conjunctivae, EOM and lids are normal. Pupils are equal, round, and reactive to light.  Neck: Normal range of motion. Neck supple. No tracheal deviation present. No mass present.  Cardiovascular: Normal rate, regular rhythm and normal heart sounds.  Exam reveals no gallop.   No murmur heard. Pulmonary/Chest: Effort normal and breath sounds  normal. No stridor. No respiratory distress. She has no decreased breath sounds. She has no wheezes. She has no rhonchi. She has no rales.  Abdominal: Soft. Normal appearance and bowel sounds are normal. She exhibits no distension. There is no tenderness. There is no rebound and no CVA tenderness.  Musculoskeletal: Normal range of motion. She exhibits no edema and no tenderness.  Neurological: She is alert and oriented to person, place, and time. She has normal strength. No cranial nerve deficit or sensory deficit. GCS eye subscore is 4. GCS verbal subscore is 5. GCS motor subscore is 6.  Skin: Skin is warm and dry. No abrasion and no rash noted.  Psychiatric: She has a normal mood and affect. Her speech is normal and behavior is normal.    ED Course  Procedures (including critical care time) Labs Review Labs Reviewed  CBC - Abnormal; Notable for the following:    RBC 3.72 (*)    Hemoglobin 10.8 (*)    HCT 33.3 (*)    RDW 16.7 (*)    All other components within normal limits  PRO B NATRIURETIC PEPTIDE - Abnormal; Notable for the following:    Pro B Natriuretic peptide (BNP) 1346.0 (*)    All other components within normal limits  BASIC METABOLIC PANEL - Abnormal; Notable for the following:    Sodium 132 (*)    Glucose, Bld 160 (*)    GFR calc non Af Amer 48 (*)    GFR calc Af Amer 56 (*)    All other components within normal limits  URINE CULTURE  TROPONIN I  URINALYSIS, ROUTINE W REFLEX MICROSCOPIC    Imaging Review Dg Chest 2 View (if Patient Has Fever And/or Copd)  05/14/2014   CLINICAL DATA:  Shortness of breath for 1 week, worsened over the past 2 days.  EXAM: CHEST  2 VIEW  COMPARISON:  PA and lateral chest 04/22/2014 05/25/2011.  FINDINGS: AICD is in place, unchanged. The patient is status post CABG and mitral valve  annuloplasty. The chest is hyperexpanded but the lungs are clear. Heart size is normal. No pneumothorax or pleural effusion. No focal bony abnormality. The  patient is status post cervical fusion. Postoperative change about the shoulders also noted.  IMPRESSION: No acute finding.  Stable compared to prior exam.   Electronically Signed   By: Drusilla Kanner M.D.   On: 05/14/2014 15:43   Ct Angio Chest Pe W/cm &/or Wo Cm  05/14/2014   CLINICAL DATA:  Chest pain, shortness of breath, weakness, post TKR 2 weeks ago  EXAM: CT ANGIOGRAPHY CHEST WITH CONTRAST  TECHNIQUE: Multidetector CT imaging of the chest was performed using the standard protocol during bolus administration of intravenous contrast. Multiplanar CT image reconstructions and MIPs were obtained to evaluate the vascular anatomy.  CONTRAST:  OMNIPAQUE IOHEXOL 350 MG/ML SOLN IV  COMPARISON:  Noncontrast CT chest 05/28/2007  FINDINGS: Scattered atherosclerotic calcifications aorta and coronary arteries.  Pacemaker leads within RIGHT atrium and RIGHT ventricle.  Post median sternotomy, CABG and MVR.  Pulmonary arteries patent.  No evidence of pulmonary embolism.  Minimally prominent precarinal lymph node 11 mm short axis image 34.  Mildly enlarged azygoesophageal recess node 15 mm short axis image 43.  Visualized upper abdomen unremarkable.  BILATERAL upper lobe scarring.  No acute infiltrate, pleural effusion, pneumothorax or mass/nodule.  Prior cervical spine fusion.  No acute osseous findings.  Review of the MIP images confirms the above findings.  IMPRESSION: No evidence of pulmonary embolism.  Extensive atherosclerotic disease changes.  Nonspecific mildly enlarged mediastinal lymph nodes.  Minimal BILATERAL upper lobe scarring.   Electronically Signed   By: Ulyses Southward M.D.   On: 05/14/2014 17:58     EKG Interpretation   Date/Time:  Thursday May 14 2014 14:20:59 EDT Ventricular Rate:  77 PR Interval:  64 QRS Duration: 131 QT Interval:  441 QTC Calculation: 499 R Axis:   -30 Text Interpretation:  A-V dual-paced rhythm with some inhibition No  further analysis attempted due to paced  rhythm Baseline wander in lead(s)  I II aVR V2 Confirmed by Freida Busman  MD, Rashelle Ireland ([id number]) on 05/14/2014 3:20:54  PM      MDM   Final diagnoses:  None   Patient with mildly elevated BNP here and was given 40 mg of Lasix. Chest CT was negative for pulmonary embolus. Patient stable for discharge.     Toy Baker, MD 05/14/14 Izell Wallace

## 2014-05-14 NOTE — ED Notes (Signed)
Pt back from x-ray.

## 2014-05-14 NOTE — ED Notes (Signed)
Pt to xray

## 2014-05-14 NOTE — ED Notes (Signed)
md at bedside

## 2014-05-14 NOTE — ED Notes (Signed)
Pt to CT

## 2014-05-14 NOTE — ED Notes (Signed)
Pt reports left knee replacement on 8/5, pt has had no complications other than bruising and SOB x1 week. Reports SOB has gotten worse the last two days. Pt able to talk in full sentences, but feels like she can't catch her breath. Reports hx of CHF. Denies cough, n/v. Reports knee pain from surgery 5/10.

## 2014-05-14 NOTE — ED Notes (Signed)
Pt alert and oriented x4. Respirations even and unlabored, bilateral symmetrical rise and fall of chest. Skin warm and dry. In no acute distress. Denies needs.   

## 2014-05-15 DIAGNOSIS — Z471 Aftercare following joint replacement surgery: Secondary | ICD-10-CM | POA: Diagnosis not present

## 2014-05-15 DIAGNOSIS — M6281 Muscle weakness (generalized): Secondary | ICD-10-CM | POA: Diagnosis not present

## 2014-05-15 DIAGNOSIS — M25569 Pain in unspecified knee: Secondary | ICD-10-CM | POA: Diagnosis not present

## 2014-05-15 DIAGNOSIS — R269 Unspecified abnormalities of gait and mobility: Secondary | ICD-10-CM | POA: Diagnosis not present

## 2014-05-16 LAB — URINE CULTURE: Colony Count: 10000

## 2014-05-18 DIAGNOSIS — R0609 Other forms of dyspnea: Secondary | ICD-10-CM | POA: Diagnosis not present

## 2014-05-18 DIAGNOSIS — Z471 Aftercare following joint replacement surgery: Secondary | ICD-10-CM | POA: Diagnosis not present

## 2014-05-18 DIAGNOSIS — M6281 Muscle weakness (generalized): Secondary | ICD-10-CM | POA: Diagnosis not present

## 2014-05-18 DIAGNOSIS — M25569 Pain in unspecified knee: Secondary | ICD-10-CM | POA: Diagnosis not present

## 2014-05-18 DIAGNOSIS — R0989 Other specified symptoms and signs involving the circulatory and respiratory systems: Secondary | ICD-10-CM | POA: Diagnosis not present

## 2014-05-18 DIAGNOSIS — R269 Unspecified abnormalities of gait and mobility: Secondary | ICD-10-CM | POA: Diagnosis not present

## 2014-05-20 DIAGNOSIS — M25569 Pain in unspecified knee: Secondary | ICD-10-CM | POA: Diagnosis not present

## 2014-05-20 DIAGNOSIS — R269 Unspecified abnormalities of gait and mobility: Secondary | ICD-10-CM | POA: Diagnosis not present

## 2014-05-20 DIAGNOSIS — M6281 Muscle weakness (generalized): Secondary | ICD-10-CM | POA: Diagnosis not present

## 2014-05-20 DIAGNOSIS — Z471 Aftercare following joint replacement surgery: Secondary | ICD-10-CM | POA: Diagnosis not present

## 2014-05-22 DIAGNOSIS — R269 Unspecified abnormalities of gait and mobility: Secondary | ICD-10-CM | POA: Diagnosis not present

## 2014-05-22 DIAGNOSIS — M25569 Pain in unspecified knee: Secondary | ICD-10-CM | POA: Diagnosis not present

## 2014-05-22 DIAGNOSIS — M6281 Muscle weakness (generalized): Secondary | ICD-10-CM | POA: Diagnosis not present

## 2014-05-22 DIAGNOSIS — Z471 Aftercare following joint replacement surgery: Secondary | ICD-10-CM | POA: Diagnosis not present

## 2014-06-03 ENCOUNTER — Ambulatory Visit (INDEPENDENT_AMBULATORY_CARE_PROVIDER_SITE_OTHER): Payer: Medicare Other | Admitting: Physician Assistant

## 2014-06-03 ENCOUNTER — Encounter: Payer: Self-pay | Admitting: Physician Assistant

## 2014-06-03 ENCOUNTER — Telehealth: Payer: Self-pay | Admitting: Physician Assistant

## 2014-06-03 VITALS — BP 130/72 | HR 82 | Ht 60.0 in | Wt 112.0 lb

## 2014-06-03 DIAGNOSIS — I2589 Other forms of chronic ischemic heart disease: Secondary | ICD-10-CM

## 2014-06-03 DIAGNOSIS — E785 Hyperlipidemia, unspecified: Secondary | ICD-10-CM

## 2014-06-03 DIAGNOSIS — R0602 Shortness of breath: Secondary | ICD-10-CM

## 2014-06-03 DIAGNOSIS — Z9889 Other specified postprocedural states: Secondary | ICD-10-CM

## 2014-06-03 DIAGNOSIS — I2510 Atherosclerotic heart disease of native coronary artery without angina pectoris: Secondary | ICD-10-CM | POA: Diagnosis not present

## 2014-06-03 DIAGNOSIS — I5022 Chronic systolic (congestive) heart failure: Secondary | ICD-10-CM

## 2014-06-03 DIAGNOSIS — I255 Ischemic cardiomyopathy: Secondary | ICD-10-CM

## 2014-06-03 DIAGNOSIS — I10 Essential (primary) hypertension: Secondary | ICD-10-CM

## 2014-06-03 NOTE — Telephone Encounter (Signed)
Left message to call back  

## 2014-06-03 NOTE — Progress Notes (Signed)
Cardiology Office Note    Date:  06/03/2014   ID:  Tandrea I Ceci, DOB 1933-08-10, MRN [id number]  PCP:  Pearla Dubonnet, MD  Cardiologist:  Dr. Donato Schultz   Electrophysiologist:  Dr. Hillis Range    History of Present Illness: [name] is a 80 y.o. female with a hx of CAD and mitral regurgitation, s/p CABG + MV repair in 2010 (L-LAD, S-Dx) with Dr. Cornelius Moras, mixed ischemic/non-ischemic CM with prior EF 15-20%, systolic CHF, s/p BiV-ICD, LBBB, HTN, post RAI hypothyroidism, RA stenosis s/p angioplasty x 2.  EF has improved over time.  Last seen by Dr. Donato Schultz 03/2014.  She had L TKR 04/2014 without apparent complication.  She has noted increased dyspnea since DC from the hospital.  She went to the ED 8/20.  CT was neg for pulmonary embolism.  She did have an elevated BNP and was given IV Lasix x 1.  Her husband is here with her and has noted that she is more breathless with activities.  She describes NYHA 2b symptoms.  She denies chest pain.  She denies syncope.  She denies orthopnea, PND, edema.     Studies:  - LHC (3/10):  prox LAD 90%, severe MR, EF 20%  - Echo (5/13):  EF 45-50%, MV repair ok (mean 6 mmHg), mild TR, mild elevated RVSP, Gr 2 DD  - Nuclear (10/08):  No ischemia, EF 40%, low risk     Recent Labs/Images: 04/22/2014: ALT 13  05/14/2014: Creatinine 1.07; Hemoglobin 10.8*; Potassium 4.4; Pro B Natriuretic peptide (BNP) 1346.0* 02/27/14:  Hgb 13.1,  04/17/2014:  TSH 0.33  Dg Chest 2 View (if Patient Has Fever And/or Copd)   05/14/2014     IMPRESSION: No acute finding.  Stable compared to prior exam.   Electronically Signed   By: Drusilla Kanner M.D.   On: 05/14/2014 15:43   Ct Angio Chest Pe W/cm &/or Wo Cm  05/14/2014      IMPRESSION: No evidence of pulmonary embolism.  Extensive atherosclerotic disease changes.  Nonspecific mildly enlarged mediastinal lymph nodes.  Minimal BILATERAL upper lobe scarring.   Electronically Signed   By: Ulyses Southward M.D.   On: 05/14/2014  17:58     Wt Readings from Last 3 Encounters:  06/03/14 112 lb (50.803 kg)  04/29/14 114 lb (51.71 kg)  04/29/14 114 lb (51.71 kg)     Past Medical History  Diagnosis Date  . Ischemic cardiomyopathy     severe. Left ventricular ejection fraction 20%.   . Chronic systolic heart failure     NYHA class II.  [name] Chronic pulmonary disease   . BBB (bundle branch block)     s/p BiV ICD implant  . Raynaud's syndrome   . Neuromuscular scoliosis of thoracolumbar region     type of scoliosis was not specified.   [name] DJD (degenerative joint disease), cervical   . DJD (degenerative joint disease), lumbar   . HTN (hypertension)   . Hyperthyroidism     following Graves disease  . Renal artery stenosis     Treated with angioplast in 1980 and 1987.   . S/P CABG (coronary artery bypass graft) April 2012  . FH: mitral valve repair     with 26 mm Edwards ring angioplasty,   . COPD (chronic obstructive pulmonary disease)   . Full dentures   . PONV (postoperative nausea and vomiting)   . CHF (congestive heart failure)   . Dizziness   . Automatic implantable  cardioverter-defibrillator in situ   . Pacemaker   . Asthma   . Pneumonia     hx  . GERD (gastroesophageal reflux disease)   . Anemia     takes iron 3 days per week    Current Outpatient Prescriptions  Medication Sig Dispense Refill  . acetaminophen-codeine (TYLENOL #3) 300-30 MG per tablet Take 1-2 tablets by mouth every 4 (four) hours as needed for moderate pain.  30 tablet  0  . carvedilol (COREG) 12.5 MG tablet Take 1 tablet (12.5 mg total) by mouth 2 (two) times daily.  180 tablet  3  . Cholecalciferol (VITAMIN D3) 2000 UNITS capsule Take 2,000 Units by mouth daily.       . Coenzyme Q10 (COQ10) 100 MG CAPS Take 100 mg by mouth daily.       . ferrous sulfate 325 (65 FE) MG tablet Take 325 mg by mouth every other day. Monday, Wednesday, and friday      . fluconazole (DIFLUCAN) 150 MG tablet Take 150 mg by mouth once a week. Sunday       . levothyroxine (SYNTHROID, LEVOTHROID) 75 MCG tablet Take 75 mcg by mouth daily before breakfast.      . montelukast (SINGULAIR) 10 MG tablet Take 10 mg by mouth daily.       . nortriptyline (PAMELOR) 25 MG capsule Take 25 mg by mouth at bedtime.       . valsartan (DIOVAN) 80 MG tablet Take 40 mg by mouth daily.       No current facility-administered medications for this visit.     Allergies:   Alprazolam; Amitriptyline; Aspirin; Calcitonin (salmon); Cefuroxime axetil; Cephalexin; Ciprofloxacin; Clonazepam; Doxycycline; Erythromycin; Fulvicin p-g; Hydrocodone; Hydrocodone-acetaminophen; Ketoconazole; Morphine and related; Oxycodone-aspirin; Penicillins; Sulfamethoxazole-trimethoprim; Sulfonamide derivatives; and Hydromorphone   Social History:  The patient  reports that she has never smoked. She does not have any smokeless tobacco history on file. She reports that she does not drink alcohol.   Family History:  The patient's family history includes Colon cancer in an other family member; Heart disease in her father; Hypertension in her father.   ROS:  Please see the history of present illness.   She has a mild NP cough.   All other systems reviewed and negative.   PHYSICAL EXAM: VS:  BP 130/72  Pulse 82  Ht 5' (1.524 m)  Wt 112 lb (50.803 kg)  BMI 21.87 kg/m2 Well nourished, well developed, in no acute distress HEENT: normal Neck: no JVD Cardiac:  normal S1, S2; RRR; no murmur Lungs:  clear to auscultation bilaterally, no wheezing, rhonchi or rales Abd: soft, nontender, no hepatomegaly Ext: no edema Skin: warm and dry Neuro:  CNs 2-12 intact, no focal abnormalities noted  EKG:  NSR, HR 82, BiV pacing, no change from prior tracing  Pacer Interrogation - Normal function, no significant sustained arrhythmias, no ICD therapies      ASSESSMENT AND PLAN:  1.  Shortness of breath:  Etiology not entirely clear.  She was anemic post op and I suspect that her symptoms are likely  explained by ABL anemia and deconditioning.  She did have an elevated BNP in the ED.  Last echo demonstrated EF 45-50% and stable MV repair.  She had a recent CT that was neg for PE.  Interrogation of her device does not identify any other reason that would explain her symptoms.     -  Check BMET, BNP, CBC   -  If BNP elevated, start on  daily Lasix.   -  If BNP remains very high, I will go ahead and get a repeat Echo.   -  Arrange Lexiscan Myoview to rule out ischemia and recheck EF. 2.  Coronary Artery Disease:  Continue beta blocker.  Proceed with Myoview as noted.  I am not certain why she is not on an ASA or statin.  She was previously on Simvastatin.  Will need to review this at FU.   3.  Ischemic cardiomyopathy:  Continue beta blocker, ARB. 4.  Chronic systolic heart failure:  She does not appear volume overloaded on exam.  Check BNP as noted.  5.  HYPERLIPIDEMIA:  Will FU with her at next visit why she is not on a statin.  6.  HYPERTENSION:  Controlled.  7.  S/P mitral valve repair:  Stable repair at most recent echo.    Disposition:  FU with me or Dr. Donato Schultz in 2-3 weeks.    Signed, Brynda Rim, MHS 06/03/2014 3:53 PM    [facility] [address] [place], [place], [place]  [address] Phone: [phone]; Fax: [phone]

## 2014-06-03 NOTE — Patient Instructions (Signed)
Your physician recommends that you return for lab work today for CBC, BMET and BNP.  Your physician has requested that you have a lexiscan myoview. For further information please visit [email or web address] Please follow instruction sheet, as given.  Your physician recommends that you schedule a follow-up appointment in: 2-3 Weeks with Dr. Anne Fu (make sure Celine Ahr is completed before appt)

## 2014-06-03 NOTE — Telephone Encounter (Signed)
Please ask Chau I Drury if there is any reason why she does not take ASA.  If there is no allergy or hx of GI bleeding, have her start ASA 81 mg QD. Tereso Newcomer, PA-C   06/03/2014 5:01 PM

## 2014-06-04 ENCOUNTER — Telehealth: Payer: Self-pay | Admitting: *Deleted

## 2014-06-04 LAB — BASIC METABOLIC PANEL
BUN: 22 mg/dL (ref 6–23)
CO2: 26 mEq/L (ref 19–32)
Calcium: 9.6 mg/dL (ref 8.4–10.5)
Chloride: 102 mEq/L (ref 96–112)
Creatinine, Ser: 1.1 mg/dL (ref 0.4–1.2)
GFR: 49.87 mL/min — AB (ref >60.00)
Glucose, Bld: 106 mg/dL — ABNORMAL HIGH (ref 70–99)
Potassium: 4.3 mEq/L (ref 3.5–5.1)
SODIUM: 134 meq/L — AB (ref 135–145)

## 2014-06-04 LAB — CBC
HCT: 38.4 % (ref 36.0–46.0)
Hemoglobin: 12.5 g/dL (ref 12.0–15.0)
MCHC: 32.6 g/dL (ref 30.0–36.0)
MCV: 90.2 fl (ref 78.0–100.0)
Platelets: 226 10*3/uL (ref 150.0–400.0)
RBC: 4.26 Mil/uL (ref 3.87–5.11)
RDW: 15.5 % (ref 11.5–15.5)
WBC: 6.6 10*3/uL (ref 4.0–10.5)

## 2014-06-04 LAB — BRAIN NATRIURETIC PEPTIDE: PRO B NATRI PEPTIDE: 70 pg/mL (ref 0.0–100.0)

## 2014-06-04 NOTE — Telephone Encounter (Signed)
lmptcb per Bing Neighbors. PA to ask pt why she is not taking ASA. Is there a hx of GI bleed or does she have an allergy that we do not know about. Per Bing Neighbors. PA if not taking ASA he would like her to start ASA 81 mg daily.

## 2014-06-04 NOTE — Telephone Encounter (Signed)
lmptcb per Scott W. PA to ask pt why she is not taking ASA. Is there a hx of GI bleed or does she have an allergy that we do not know about. Per Scott W. PA if not taking ASA he would like her to start ASA 81 mg daily.  

## 2014-06-05 MED ORDER — ASPIRIN EC 81 MG PO TBEC: 81.0000 mg | DELAYED_RELEASE_TABLET | Freq: Every day | ORAL | Status: DC

## 2014-06-05 NOTE — Telephone Encounter (Signed)
Spoke with pt. She reports no allergy to aspirin or history of GI bleeding. She will start enteric coated ASA 81 mg by mouth daily.

## 2014-06-09 NOTE — Telephone Encounter (Signed)
Pt s/w Pat A. RN and is now taking ASA 81 mg see phone note

## 2014-06-17 ENCOUNTER — Ambulatory Visit (HOSPITAL_COMMUNITY): Payer: Medicare Other | Attending: Physician Assistant | Admitting: Radiology

## 2014-06-17 VITALS — BP 138/67 | Ht 60.0 in | Wt 108.0 lb

## 2014-06-17 DIAGNOSIS — I10 Essential (primary) hypertension: Secondary | ICD-10-CM | POA: Insufficient documentation

## 2014-06-17 DIAGNOSIS — R079 Chest pain, unspecified: Secondary | ICD-10-CM

## 2014-06-17 DIAGNOSIS — R5383 Other fatigue: Secondary | ICD-10-CM | POA: Diagnosis not present

## 2014-06-17 DIAGNOSIS — I447 Left bundle-branch block, unspecified: Secondary | ICD-10-CM | POA: Diagnosis not present

## 2014-06-17 DIAGNOSIS — R0602 Shortness of breath: Secondary | ICD-10-CM | POA: Diagnosis not present

## 2014-06-17 DIAGNOSIS — I2510 Atherosclerotic heart disease of native coronary artery without angina pectoris: Secondary | ICD-10-CM

## 2014-06-17 DIAGNOSIS — R5381 Other malaise: Secondary | ICD-10-CM | POA: Diagnosis not present

## 2014-06-17 MED ORDER — AMINOPHYLLINE 25 MG/ML IV SOLN
75.0000 mg | Freq: Two times a day (BID) | INTRAVENOUS | Status: DC | PRN
  Administered 2014-06-17: 75 mg via INTRAVENOUS

## 2014-06-17 MED ORDER — TECHNETIUM TC 99M SESTAMIBI GENERIC - CARDIOLITE
33.0000 | Freq: Once | INTRAVENOUS | Status: AC | PRN
  Administered 2014-06-17: 33 via INTRAVENOUS

## 2014-06-17 MED ORDER — TECHNETIUM TC 99M SESTAMIBI GENERIC - CARDIOLITE
11.0000 | Freq: Once | INTRAVENOUS | Status: AC | PRN
Start: 2014-06-17 — End: 2014-06-17
  Administered 2014-06-17: 11 via INTRAVENOUS

## 2014-06-17 MED ORDER — REGADENOSON 0.4 MG/5ML IV SOLN
0.4000 mg | Freq: Once | INTRAVENOUS | Status: AC
  Administered 2014-06-17: 0.4 mg via INTRAVENOUS

## 2014-06-17 NOTE — Progress Notes (Signed)
[facility] SITE 3 NUCLEAR MED [address] [place], [place] [address] [phone]    Cardiology Nuclear Med Study  Silas I Hofer is a 80 y.o. female     MRN : [id number]     DOB: 09-09-1933  Procedure Date: 06/17/2014  Nuclear Med Background Indication for Stress Test:  Evaluation for Ischemia, Graft Patency, and 05-14-2014 ED Dyspnea> Chest CT with Atherosclerotic of Coronary Arteries History:  Asthma, COPD and '08 MPI: EF: 40% NL, AICD Cardiac Risk Factors: Hypertension and LBBB  Symptoms:  Chest Pain, Fatigue and SOB   Nuclear Pre-Procedure Caffeine/Decaff Intake:  None> 12 hrs NPO After: 7:00am   Lungs:  clear O2 Sat: 98% on room air. IV 0.9% NS with Angio Cath:  24g  IV Site: R Hand, tolerated well  IV Started by:  Irean Hong, RN  Chest Size (in):  34 Cup Size: B  Height: 5' (1.524 m)  Weight:  108 lb (48.988 kg)  BMI:  Body mass index is 21.09 kg/(m^2). Tech Comments:  Patient took Coreg this am. Irean Hong, RN. Aminophylline given for symptoms. All were resolved before leaving. S.Williams EMTP    Nuclear Med Study 1 or 2 day study: 1 day  Stress Test Type:  Lexiscan  Reading MD: N/A  Order Authorizing Provider:  Donato Schultz, MD, and Tereso Newcomer, PAC  Resting Radionuclide: Technetium 80m Sestamibi  Resting Radionuclide Dose: 11.0 mCi   Stress Radionuclide:  Technetium 6m Sestamibi  Stress Radionuclide Dose: 33.0 mCi           Stress Protocol Rest HR: 65 Stress HR: 85  Rest BP: 138/67 Stress BP: 152/84  Exercise Time (min): n/a METS: n/a   Predicted Max HR: 141 bpm % Max HR: 60.28 bpm Rate Pressure Product: 72620   Dose of Adenosine (mg):  n/a Dose of Lexiscan: 0.4 mg  Dose of Atropine (mg): n/a Dose of Dobutamine: n/a mcg/kg/min (at max HR)  Stress Test Technologist: Milana Na, EMT-P  Nuclear Technologist:  Kerby Nora, CNMT     Rest Procedure:  Myocardial perfusion imaging was performed at rest 45 minutes following the  intravenous administration of Technetium 10m Sestamibi. Rest ECG: NSR IVCD limb lead reveral  Stress Procedure:  The patient received IV Lexiscan 0.4 mg over 15-seconds.  Technetium 16m Sestamibi injected at 30-seconds. This patient was very sob with the Lexiscan injection. Quantitative spect images were obtained after a 45 minute delay. Stress ECG: No significant change from baseline ECG  QPS Raw Data Images:  Normal; no motion artifact; normal heart/lung ratio. Stress Images:  Normal homogeneous uptake in all areas of the myocardium. Rest Images:  Normal homogeneous uptake in all areas of the myocardium. Subtraction (SDS):  No evidence of ischemia. Transient Ischemic Dilatation (Normal <1.22):  1.01 Lung/Heart Ratio (Normal <0.45):  0.32  Quantitative Gated Spect Images QGS EDV:  49 ml QGS ESV:  12 ml  Impression Exercise Capacity:  Lexiscan with low level exercise. BP Response:  Normal blood pressure response. Clinical Symptoms:  There is dyspnea. ECG Impression:  No significant ST segment change suggestive of ischemia. Comparison with Prior Nuclear Study: No images to compare  Overall Impression:  Normal stress nuclear study.  LV Ejection Fraction: 76%.  LV Wall Motion:  NL LV Function; NL Wall Motion      [name]

## 2014-06-18 ENCOUNTER — Telehealth: Payer: Self-pay | Admitting: *Deleted

## 2014-06-18 ENCOUNTER — Encounter: Payer: Self-pay | Admitting: Physician Assistant

## 2014-06-18 NOTE — Telephone Encounter (Signed)
pt notified about normal myoview with verbal understanding 

## 2014-06-22 ENCOUNTER — Ambulatory Visit (INDEPENDENT_AMBULATORY_CARE_PROVIDER_SITE_OTHER): Payer: Medicare Other | Admitting: Cardiology

## 2014-06-22 ENCOUNTER — Encounter: Payer: Self-pay | Admitting: Cardiology

## 2014-06-22 ENCOUNTER — Encounter: Payer: Self-pay | Admitting: Internal Medicine

## 2014-06-22 ENCOUNTER — Ambulatory Visit (INDEPENDENT_AMBULATORY_CARE_PROVIDER_SITE_OTHER): Payer: Medicare Other | Admitting: Internal Medicine

## 2014-06-22 VITALS — BP 148/62 | HR 63 | Ht 60.0 in | Wt 113.0 lb

## 2014-06-22 VITALS — BP 148/62 | HR 63 | Ht 60.0 in | Wt 113.8 lb

## 2014-06-22 DIAGNOSIS — I255 Ischemic cardiomyopathy: Secondary | ICD-10-CM

## 2014-06-22 DIAGNOSIS — Z9889 Other specified postprocedural states: Secondary | ICD-10-CM | POA: Diagnosis not present

## 2014-06-22 DIAGNOSIS — R0602 Shortness of breath: Secondary | ICD-10-CM

## 2014-06-22 DIAGNOSIS — I10 Essential (primary) hypertension: Secondary | ICD-10-CM | POA: Diagnosis not present

## 2014-06-22 DIAGNOSIS — I428 Other cardiomyopathies: Secondary | ICD-10-CM | POA: Diagnosis not present

## 2014-06-22 DIAGNOSIS — Z951 Presence of aortocoronary bypass graft: Secondary | ICD-10-CM | POA: Diagnosis not present

## 2014-06-22 DIAGNOSIS — I2589 Other forms of chronic ischemic heart disease: Secondary | ICD-10-CM | POA: Diagnosis not present

## 2014-06-22 DIAGNOSIS — Z95810 Presence of automatic (implantable) cardiac defibrillator: Secondary | ICD-10-CM

## 2014-06-22 DIAGNOSIS — I2510 Atherosclerotic heart disease of native coronary artery without angina pectoris: Secondary | ICD-10-CM

## 2014-06-22 LAB — MDC_IDC_ENUM_SESS_TYPE_INCLINIC
Battery Voltage: 2.51 V
Brady Statistic RA Percent Paced: 39 %
HIGH POWER IMPEDANCE MEASURED VALUE: 45 Ohm
Implantable Pulse Generator Serial Number: 702836
Lead Channel Impedance Value: 375 Ohm
Lead Channel Impedance Value: 400 Ohm
Lead Channel Impedance Value: 600 Ohm
Lead Channel Pacing Threshold Amplitude: 0.75 V
Lead Channel Pacing Threshold Amplitude: 0.75 V
Lead Channel Pacing Threshold Amplitude: 1 V
Lead Channel Pacing Threshold Amplitude: 1.25 V
Lead Channel Pacing Threshold Pulse Width: 0.5 ms
Lead Channel Pacing Threshold Pulse Width: 0.5 ms
Lead Channel Pacing Threshold Pulse Width: 0.5 ms
Lead Channel Sensing Intrinsic Amplitude: 3.1 mV
Lead Channel Setting Pacing Amplitude: 2 V
Lead Channel Setting Pacing Amplitude: 2 V
Lead Channel Setting Pacing Pulse Width: 0.5 ms
Lead Channel Setting Sensing Sensitivity: 0.3 mV
MDC IDC MSMT BATTERY REMAINING LONGEVITY: 5.1 mo
MDC IDC MSMT LEADCHNL LV PACING THRESHOLD AMPLITUDE: 1 V
MDC IDC MSMT LEADCHNL LV PACING THRESHOLD PULSEWIDTH: 0.5 ms
MDC IDC MSMT LEADCHNL RA PACING THRESHOLD PULSEWIDTH: 0.5 ms
MDC IDC MSMT LEADCHNL RA PACING THRESHOLD PULSEWIDTH: 0.5 ms
MDC IDC MSMT LEADCHNL RV PACING THRESHOLD AMPLITUDE: 1.25 V
MDC IDC MSMT LEADCHNL RV SENSING INTR AMPL: 11.1 mV
MDC IDC SESS DTM: 20150928152330
MDC IDC SET LEADCHNL LV PACING PULSEWIDTH: 0.5 ms
MDC IDC SET LEADCHNL RV PACING AMPLITUDE: 2.5 V
MDC IDC SET ZONE DETECTION INTERVAL: 300 ms
MDC IDC STAT BRADY RV PERCENT PACED: 95 %
Zone Setting Detection Interval: 400 ms

## 2014-06-22 NOTE — Progress Notes (Signed)
[address] [place], [place]  [address] Phone: [phone] Fax:  [phone]  Date:  06/22/2014   ID:  [name], DOB 14-May-1933, MRN [id number]  PCP:  Pearla Dubonnet, MD   History of Present Illness: [name] is a 80 y.o. female ischemic/nonischemic cardiomyopathy status post bypass surgery as well as mitral valve repair with biventricular defibrillator, [employer] with partial resolution of her ejection fraction from 10% up to 45-50% in 2013 here for followup (EF 70% on 2015 NUC stress with no ischemia).  She had her knee replacement and has been feeling fatigue, shortness of breath. In fact went to the emergency department after she was extremely fatigued, CT angiogram was normal. Her BNP was slightly elevated and she received one dose of Lasix which may or may not have helped. Her husband is very concerned about this. He states that she is not a sharp as  she once was. She will open newspaper and not read not as much as she use to. Her dyspnea on exertion seems to be improved. She underwent a nuclear stress which showed no ischemia, ejection fraction calculated in the 70% range (possible overestimation).  No blood loss.   Wt Readings from Last 3 Encounters:  06/22/14 113 lb (51.256 kg)  06/22/14 113 lb 12.8 oz (51.619 kg)  06/17/14 108 lb (48.988 kg)     Past Medical History  Diagnosis Date  . Ischemic cardiomyopathy     severe. Left ventricular ejection fraction 20%.   . Chronic systolic heart failure     NYHA class II.  [name] Chronic pulmonary disease   . BBB (bundle branch block)     s/p BiV ICD implant  . Raynaud's syndrome   . Neuromuscular scoliosis of thoracolumbar region     type of scoliosis was not specified.   [name] DJD (degenerative joint disease), cervical   . DJD (degenerative joint disease), lumbar   . HTN (hypertension)   . Hyperthyroidism     following Graves disease  . Renal artery stenosis     Treated with angioplast in  1980 and 1987.   . S/P CABG (coronary artery bypass graft) April 2012  . FH: mitral valve repair     with 26 mm Edwards ring angioplasty,   . COPD (chronic obstructive pulmonary disease)   . Full dentures   . PONV (postoperative nausea and vomiting)   . CHF (congestive heart failure)   . Dizziness   . Automatic implantable cardioverter-defibrillator in situ   . Pacemaker   . Asthma   . Pneumonia     hx  . GERD (gastroesophageal reflux disease)   . Anemia     takes iron 3 days per week  . Hx of cardiovascular stress test     Lexiscan Myoview (9/15):  Normal stress nuclear study.  LV Ejection Fraction: 76%    Past Surgical History  Procedure Laterality Date  . Total abdominal hysterectomy    . Sympathectomy    . Tonsillectomy    . Renal artery ballon dilation    . Rotator cuff repair      right and left  . Laminotomy/foraminotomy      with decompression of the L4 nerve root   . Cervical fusion      C5-6 and C6-7, C4-5 with titanium plates  . Umbilical hernia repair    . Wedge resection  2001    for the right upper lobe for  Aspergillus treatement.  Dr. Edwyna Shell apprix 2001.  [name] Ptca      of bilateral renal arteries  . Cataract extraction    . Carpal tunnel release    . Appendectomy    . Breast lumpectomy      left breast  . Renal artery ballon dilation      x2  . Precancerous growth      tops of ear removed. bilateral.   . Implantation of icd  2010    BiV ICD implant (SJM) by Dr Amil Amen 03/2009  . Repair extensor tendon  07/10/2012    Procedure: REPAIR EXTENSOR TENDON;  Surgeon: Nicki Reaper, MD;  Location: [facility];  Service: Orthopedics;  Laterality: Right;  METACARPAL PHALANGEAL REPLACEMENT ARTHROPLASTIES RIGHT INDEX, MIDDLE, AND RING FINGERS    . Finger arthroplasty  07/10/2012    Procedure: FINGER ARTHROPLASTY;  Surgeon: Nicki Reaper, MD;  Location: [facility];  Service: Orthopedics;  Laterality: Right;  METACARPAL PHALANGEAL  ARTHROPLASTIES RIGHT INDEX, MIDDLE, AND RING FINGERS   . Esophageal manometry N/A 04/07/2013    Procedure: ESOPHAGEAL MANOMETRY (EM);  Surgeon: Charolett Bumpers, MD;  Location: [facility];  Service: Endoscopy;  Laterality: N/A;  . Carpometacarpel suspension plasty Right 11/12/2013    Procedure: SUSPENSION PLASTY RIGHT THUMB, TRAPEZIUM EXCISION;  Surgeon: Nicki Reaper, MD;  Location: [facility];  Service: Orthopedics;  Laterality: Right;  . Tendon transfer Right 11/12/2013    Procedure: RIGHT ABDUCTOR POLLICUS LONGUS TENDON TRANSFER;  Surgeon: Nicki Reaper, MD;  Location: [facility];  Service: Orthopedics;  Laterality: Right;  . Tonsillectomy    . Hemorrhoidectomy with hemorrhoid banding    . Facial cosmetic surgery    . Back surgery    . Eye surgery Bilateral     cataracts  . Hernia repair      umbilical  . Lung mass removal Right   . Left knee arthroscopic    . Coronary artery bypass graft  2010    mvr/cabg  . Total knee arthroplasty Left 04/29/2014    Procedure: LEFT TOTAL KNEE ARTHROPLASTY;  Surgeon: Nadara Mustard, MD;  Location: [facility];  Service: Orthopedics;  Laterality: Left;    Current Outpatient Prescriptions  Medication Sig Dispense Refill  . acetaminophen-codeine (TYLENOL #3) 300-30 MG per tablet Take 1-2 tablets by mouth every 4 (four) hours as needed for moderate pain.  30 tablet  0  . aspirin EC 81 MG tablet Take 1 tablet (81 mg total) by mouth daily.  90 tablet  3  . carvedilol (COREG) 12.5 MG tablet Take 1 tablet (12.5 mg total) by mouth 2 (two) times daily.  180 tablet  3  . Cholecalciferol (VITAMIN D3) 2000 UNITS capsule Take 2,000 Units by mouth daily.       . Coenzyme Q10 (COQ10) 100 MG CAPS Take 100 mg by mouth daily.       . ferrous sulfate 325 (65 FE) MG tablet Take 1 tablet ONLY on Monday, Wednesday, and Friday      . fluconazole (DIFLUCAN) 150 MG tablet Take 150 mg by mouth once a week. Sunday      . levothyroxine (SYNTHROID,  LEVOTHROID) 75 MCG tablet Take 75 mcg by mouth daily before breakfast.      . montelukast (SINGULAIR) 10 MG tablet Take 10 mg by mouth daily.       . valsartan (DIOVAN) 80 MG tablet Take 40 mg by mouth daily.  No current facility-administered medications for this visit.    Allergies:    Allergies  Allergen Reactions  . Alprazolam Other (See Comments)    REACTION: ulcer's in mouth and extreme constipation  . Doxycycline Other (See Comments)    REACTION: severe rash over entire body  . Fulvicin P-G [Griseofulvin] Other (See Comments)    Severe headaches  . Hydrocodone Other (See Comments)    REACTION: nausea and totally out of it  . Ketoconazole Other (See Comments)    REACTION: terribly weak, voice shook  . Calcitonin (Salmon) Other (See Comments)    REACTION: rash over entire body  . Morphine And Related Nausea And Vomiting  . Amitriptyline     unknown  . Cefuroxime Axetil Other (See Comments)    REACTION: either rash or diarrhea  . Hydrocodone-Acetaminophen Other (See Comments)    REACTION: reaction forgotten  . Sulfamethoxazole-Trimethoprim Other (See Comments)    REACTION: either rash or diarrhea  . Sulfonamide Derivatives Other (See Comments)    REACTION: reaction forgotten  . Aspirin Other (See Comments)    REACTION: upsets stomach  . Cephalexin Rash  . Ciprofloxacin Rash  . Clonazepam Other (See Comments)    REACTION: 1/2 pill makes grogginess next day  . Erythromycin Rash  . Hydromorphone Rash  . Oxycodone-Aspirin Other (See Comments)    REACTION: nausea  . Penicillins Rash    Social History:  The patient  reports that she has never smoked. She does not have any smokeless tobacco history on file. She reports that she does not drink alcohol.   ROS:  Please see the history of present illness.   Denies any fevers, chills, chest pain, orthopnea. She is not complaining currently of shortness of breath. Positive neck pain.   All other systems reviewed and  negative.   PHYSICAL EXAM: VS:  BP 148/62  Pulse 63  Ht 5' (1.524 m)  Wt 113 lb (51.256 kg)  BMI 22.07 kg/m2 Thin, elderly, in no acute distress HEENT: normalCervical neck scar is noted Neck: no JVD Cardiac:  normal S1, S2; RRR; occasional ectopy, what sounds like brief PAT, no murmur Lungs:  clear to auscultation bilaterally, no wheezing, rhonchi or rales Abd: soft, nontender, no hepatomegaly Ext: no edema Skin: warm and dry Neuro: no focal abnormalities noted  EKG:  Defibrillator interrogated 06/22/14-normal . Previous interrogation in September2014 demonstrated 6 mode switches approximately 10 seconds duration. There did not seem to be any correlation with symptoms this morning/today and any mode switch or arrhythmia.  Prior EKG demonstrated biventricular pacing, occasional PAC/PVC  ASSESSMENT AND PLAN:  1. Cardiomyopathy- Most recent ejection fraction reassuring at 45-50% on echocardiogram, 70% on nuclear stress test which may be overestimation. Blood pressure currently on the upper/mildly elevated side. I do not wish to decrease her antihypertensives at this time. I would like to continue with carvedilol at current dosing also to help suppress PAT/PVCs. I'm very reassured by her current nuclear stress test shows no ischemia, improved ejection fraction. Thankfully, her symptoms do not seem to be cardiac rhythm. 2. Ischemic cardiomyopathy/nonischemic-reassuring improvement of ejection fraction from 10% up to 45-50%. 3. Prior mitral valve repair 4. Prior knee replacement-slowly improving. 5. Prior bypass, CABG 6. Bundle branch block-underlying, chronic. Currently biventricular paced 7. Biventricular pacemaker/ICD-functioning well. Dr. Johney Frame. 8. 6 month followup   Signed, Donato Schultz, MD [facility]  06/22/2014 3:31 PM

## 2014-06-22 NOTE — Patient Instructions (Signed)
Your physician wants you to follow-up in: 6 months with Dr. Johney Frame.  You will receive a reminder letter in the mail two months in advance. If you don't receive a letter, please call our office to schedule the follow-up appointment. Remote monitoring is used to monitor your Pacemaker of ICD from home. This monitoring reduces the number of office visits required to check your device to one time per year. It allows [place] to keep an eye on the functioning of your device to ensure it is working properly. You are scheduled for a device check from home on 09/23/14.. You may send your transmission at any time that day. If you have a wireless device, the transmission will be sent automatically. After your physician reviews your transmission, you will receive a postcard with your next transmission date.

## 2014-06-22 NOTE — Progress Notes (Deleted)
Cardiology Office Note    Date:  06/22/2014   ID:  [name], DOB [birth year], MRN [id number]  PCP:  Pearla Dubonnet, MD  Cardiologist:  Dr. Donato Schultz   Electrophysiologist:  Dr. Hillis Range    History of Present Illness: [name] is a 80 y.o. female with a hx of CAD and mitral regurgitation, s/p CABG + MV repair in 2010 (L-LAD, S-Dx) with Dr. Cornelius Moras, mixed ischemic/non-ischemic CM with prior EF 15-20%, systolic CHF, s/p BiV-ICD, LBBB, HTN, post RAI hypothyroidism, RA stenosis s/p angioplasty x 2.  EF has improved over time.    She had L TKR 04/2014 without apparent complication.  She has noted increased dyspnea since DC from the hospital.  She went to the ED 8/20.  CT was neg for pulmonary embolism.  She did have an elevated BNP and was given IV Lasix x 1.  Her husband is here with her and has noted that she is more breathless with activities.  She describes NYHA 2b symptoms.  She denies chest pain.  She denies syncope.  She denies orthopnea, PND, edema.     Studies:  - LHC (3/10):  prox LAD 90%, severe MR, EF 20%  - Echo (5/13):  EF 45-50%, MV repair ok (mean 6 mmHg), mild TR, mild elevated RVSP, Gr 2 DD  - Nuclear (10/08):  No ischemia, EF 40%, low risk   - Nuclear (9/15): No ischemia, ejection fraction 76%    Recent Labs/Images: 04/22/2014: ALT 13  06/03/2014: Creatinine 1.1; Hemoglobin 12.5; Potassium 4.3; Pro B Natriuretic peptide (BNP) 70.0 02/27/14:  Hgb 13.1,  04/17/2014:  TSH 0.33  Dg Chest 2 View (if Patient Has Fever And/or Copd)   05/14/2014     IMPRESSION: No acute finding.  Stable compared to prior exam.   Electronically Signed   By: Drusilla Kanner M.D.   On: 05/14/2014 15:43   Ct Angio Chest Pe W/cm &/or Wo Cm  05/14/2014      IMPRESSION: No evidence of pulmonary embolism.  Extensive atherosclerotic disease changes.  Nonspecific mildly enlarged mediastinal lymph nodes.  Minimal BILATERAL upper lobe scarring.   Electronically Signed   By: Ulyses Southward M.D.   On:  05/14/2014 17:58     Wt Readings from Last 3 Encounters:  06/22/14 113 lb (51.256 kg)  06/22/14 113 lb 12.8 oz (51.619 kg)  06/17/14 108 lb (48.988 kg)     Past Medical History  Diagnosis Date  . Ischemic cardiomyopathy     severe. Left ventricular ejection fraction 20%.   . Chronic systolic heart failure     NYHA class II.  [name] Chronic pulmonary disease   . BBB (bundle branch block)     s/p BiV ICD implant  . Raynaud's syndrome   . Neuromuscular scoliosis of thoracolumbar region     type of scoliosis was not specified.   [name] DJD (degenerative joint disease), cervical   . DJD (degenerative joint disease), lumbar   . HTN (hypertension)   . Hyperthyroidism     following Graves disease  . Renal artery stenosis     Treated with angioplast in 1980 and 1987.   . S/P CABG (coronary artery bypass graft) April 2012  . FH: mitral valve repair     with 26 mm Edwards ring angioplasty,   . COPD (chronic obstructive pulmonary disease)   . Full dentures   . PONV (postoperative nausea and vomiting)   . CHF (congestive heart failure)   . Dizziness   .  Automatic implantable cardioverter-defibrillator in situ   . Pacemaker   . Asthma   . Pneumonia     hx  . GERD (gastroesophageal reflux disease)   . Anemia     takes iron 3 days per week  . Hx of cardiovascular stress test     Lexiscan Myoview (9/15):  Normal stress nuclear study.  LV Ejection Fraction: 76%    Current Outpatient Prescriptions  Medication Sig Dispense Refill  . acetaminophen-codeine (TYLENOL #3) 300-30 MG per tablet Take 1-2 tablets by mouth every 4 (four) hours as needed for moderate pain.  30 tablet  0  . aspirin EC 81 MG tablet Take 1 tablet (81 mg total) by mouth daily.  90 tablet  3  . carvedilol (COREG) 12.5 MG tablet Take 1 tablet (12.5 mg total) by mouth 2 (two) times daily.  180 tablet  3  . Cholecalciferol (VITAMIN D3) 2000 UNITS capsule Take 2,000 Units by mouth daily.       . Coenzyme Q10 (COQ10) 100 MG CAPS  Take 100 mg by mouth daily.       . ferrous sulfate 325 (65 FE) MG tablet Take 1 tablet ONLY on Monday, Wednesday, and Friday      . fluconazole (DIFLUCAN) 150 MG tablet Take 150 mg by mouth once a week. Sunday      . levothyroxine (SYNTHROID, LEVOTHROID) 75 MCG tablet Take 75 mcg by mouth daily before breakfast.      . montelukast (SINGULAIR) 10 MG tablet Take 10 mg by mouth daily.       . valsartan (DIOVAN) 80 MG tablet Take 40 mg by mouth daily.       No current facility-administered medications for this visit.     Allergies:   Alprazolam; Doxycycline; Fulvicin p-g; Hydrocodone; Ketoconazole; Calcitonin (salmon); Morphine and related; Amitriptyline; Cefuroxime axetil; Hydrocodone-acetaminophen; Sulfamethoxazole-trimethoprim; Sulfonamide derivatives; Aspirin; Cephalexin; Ciprofloxacin; Clonazepam; Erythromycin; Hydromorphone; Oxycodone-aspirin; and Penicillins   Social History:  The patient  reports that she has never smoked. She does not have any smokeless tobacco history on file. She reports that she does not drink alcohol.   Family History:  The patient's family history includes Colon cancer in an other family member; Heart disease in her father; Hypertension in her father.   ROS:  Please see the history of present illness.   She has a mild NP cough.   All other systems reviewed and negative.   PHYSICAL EXAM: VS:  BP 148/62  Pulse 63  Ht 5' (1.524 m)  Wt 113 lb (51.256 kg)  BMI 22.07 kg/m2 Well nourished, well developed, in no acute distress HEENT: normal Neck: no JVD Cardiac:  normal S1, S2; RRR; no murmur Lungs:  clear to auscultation bilaterally, no wheezing, rhonchi or rales Abd: soft, nontender, no hepatomegaly Ext: no edema Skin: warm and dry Neuro:  CNs 2-12 intact, no focal abnormalities noted  EKG:  NSR, HR 82, BiV pacing, no change from prior tracing  Pacer Interrogation - Normal function, no significant sustained arrhythmias, no ICD therapies      ASSESSMENT  AND PLAN:  1.  Shortness of breath:  Etiology not entirely clear.  She was anemic post op and I suspect that her symptoms are likely explained by ABL anemia and deconditioning.  She did have an elevated BNP in the ED.  Last echo demonstrated EF 45-50% and stable MV repair.  She had a recent CT that was neg for PE.  Interrogation of her device does not identify any other  reason that would explain her symptoms.     -  Check BMET, BNP, CBC   -  If BNP elevated, start on daily Lasix.   -  If BNP remains very high, I will go ahead and get a repeat Echo.   -  Arrange Lexiscan Myoview to rule out ischemia and recheck EF. 2.  Coronary Artery Disease:  Continue beta blocker.  Proceed with Myoview as noted.  I am not certain why she is not on an ASA or statin.  She was previously on Simvastatin.  Will need to review this at FU.   3.  Ischemic cardiomyopathy:  Continue beta blocker, ARB. 4.  Chronic systolic heart failure:  She does not appear volume overloaded on exam.  Check BNP as noted.  5.  HYPERLIPIDEMIA:  Will FU with her at next visit why she is not on a statin.  6.  HYPERTENSION:  Controlled.  7.  S/P mitral valve repair:  Stable repair at most recent echo.    Disposition:  FU with me or Dr. Donato Schultz in 2-3 weeks.    Signed, Brynda Rim, MHS 06/22/2014 3:16 PM    [facility] [address] [place], [place], [place]  [address] Phone: [phone]; Fax: [phone]

## 2014-06-22 NOTE — Progress Notes (Signed)
. PCP: Denise Dubonnet, MD Primary Cardiologist:  Dr Anne Fu  [name] is a 80 y.o. female who presents today for routine electrophysiology followup. She reports stable but significant fatigue and SOB.  She is recovering from recent knee surgery.  She was recently evaluated by [name] and had a myoview which revealed EF 76% with no ischemic changes.  Today, she denies symptoms of palpitations, chest pain,    lower extremity edema, dizziness, presyncope, syncope, or ICD shocks.  The patient is otherwise without complaint today.   Past Medical History  Diagnosis Date  . Ischemic cardiomyopathy     severe. Left ventricular ejection fraction 20%.   . Chronic systolic heart failure     NYHA class II.  [name] Chronic pulmonary disease   . BBB (bundle branch block)     s/p BiV ICD implant  . Raynaud's syndrome   . Neuromuscular scoliosis of thoracolumbar region     type of scoliosis was not specified.   [name] DJD (degenerative joint disease), cervical   . DJD (degenerative joint disease), lumbar   . HTN (hypertension)   . Hyperthyroidism     following Graves disease  . Renal artery stenosis     Treated with angioplast in 1980 and 1987.   . S/P CABG (coronary artery bypass graft) April 2012  . FH: mitral valve repair     with 26 mm Edwards ring angioplasty,   . COPD (chronic obstructive pulmonary disease)   . Full dentures   . PONV (postoperative nausea and vomiting)   . CHF (congestive heart failure)   . Dizziness   . Automatic implantable cardioverter-defibrillator in situ   . Pacemaker   . Asthma   . Pneumonia     hx  . GERD (gastroesophageal reflux disease)   . Anemia     takes iron 3 days per week  . Hx of cardiovascular stress test     Lexiscan Myoview (9/15):  Normal stress nuclear study.  LV Ejection Fraction: 76%   Past Surgical History  Procedure Laterality Date  . Total abdominal hysterectomy    . Sympathectomy    . Tonsillectomy    . Renal artery ballon  dilation    . Rotator cuff repair      right and left  . Laminotomy/foraminotomy      with decompression of the L4 nerve root   . Cervical fusion      C5-6 and C6-7, C4-5 with titanium plates  . Umbilical hernia repair    . Wedge resection  2001    for the right upper lobe for Aspergillus treatement.  Dr. Edwyna [name] apprix 2001.  [name] Ptca      of bilateral renal arteries  . Cataract extraction    . Carpal tunnel release    . Appendectomy    . Breast lumpectomy      left breast  . Renal artery ballon dilation      x2  . Precancerous growth      tops of ear removed. bilateral.   . Implantation of icd  2010    BiV ICD implant (SJM) by Dr Amil Amen 03/2009  . Repair extensor tendon  07/10/2012    Procedure: REPAIR EXTENSOR TENDON;  Surgeon: Denise Reaper, MD;  Location: [facility];  Service: Orthopedics;  Laterality: Right;  METACARPAL PHALANGEAL REPLACEMENT ARTHROPLASTIES RIGHT INDEX, MIDDLE, AND RING FINGERS    . Finger arthroplasty  07/10/2012    Procedure: FINGER ARTHROPLASTY;  Surgeon: [name]  Denise Lynch, MD;  Location: [facility];  Service: Orthopedics;  Laterality: Right;  METACARPAL PHALANGEAL ARTHROPLASTIES RIGHT INDEX, MIDDLE, AND RING FINGERS   . Esophageal manometry N/A 04/07/2013    Procedure: ESOPHAGEAL MANOMETRY (EM);  Surgeon: Denise Bumpers, MD;  Location: [facility];  Service: Endoscopy;  Laterality: N/A;  . Carpometacarpel suspension plasty Right 11/12/2013    Procedure: SUSPENSION PLASTY RIGHT THUMB, TRAPEZIUM EXCISION;  Surgeon: Denise Reaper, MD;  Location: [facility];  Service: Orthopedics;  Laterality: Right;  . Tendon transfer Right 11/12/2013    Procedure: RIGHT ABDUCTOR POLLICUS LONGUS TENDON TRANSFER;  Surgeon: Denise Reaper, MD;  Location: [facility];  Service: Orthopedics;  Laterality: Right;  . Tonsillectomy    . Hemorrhoidectomy with hemorrhoid banding    . Facial cosmetic surgery    . Back surgery    . Eye  surgery Bilateral     cataracts  . Hernia repair      umbilical  . Lung mass removal Right   . Left knee arthroscopic    . Coronary artery bypass graft  2010    mvr/cabg  . Total knee arthroplasty Left 04/29/2014    Procedure: LEFT TOTAL KNEE ARTHROPLASTY;  Surgeon: Denise Mustard, MD;  Location: [facility];  Service: Orthopedics;  Laterality: Left;    Current Outpatient Prescriptions  Medication Sig Dispense Refill  . acetaminophen-codeine (TYLENOL #3) 300-30 MG per tablet Take 1-2 tablets by mouth every 4 (four) hours as needed for moderate pain.  30 tablet  0  . aspirin EC 81 MG tablet Take 1 tablet (81 mg total) by mouth daily.  90 tablet  3  . carvedilol (COREG) 12.5 MG tablet Take 1 tablet (12.5 mg total) by mouth 2 (two) times daily.  180 tablet  3  . Cholecalciferol (VITAMIN D3) 2000 UNITS capsule Take 2,000 Units by mouth daily.       . Coenzyme Q10 (COQ10) 100 MG CAPS Take 100 mg by mouth daily.       . ferrous sulfate 325 (65 FE) MG tablet Take 1 tablet ONLY on Monday, Wednesday, and Friday      . fluconazole (DIFLUCAN) 150 MG tablet Take 150 mg by mouth once a week. Sunday      . levothyroxine (SYNTHROID, LEVOTHROID) 75 MCG tablet Take 75 mcg by mouth daily before breakfast.      . montelukast (SINGULAIR) 10 MG tablet Take 10 mg by mouth daily.       . valsartan (DIOVAN) 80 MG tablet Take 40 mg by mouth daily.       No current facility-administered medications for this visit.    Physical Exam: Filed Vitals:   06/22/14 1437  BP: 148/62  Pulse: 63  Height: 5' (1.524 m)  Weight: 113 lb 12.8 oz (51.619 kg)    GEN- The patient is well appearing, alert and oriented x 3 today.   Head- normocephalic, atraumatic Eyes-  Sclera clear, conjunctiva pink Ears- hearing intact Oropharynx- clear Lungs- Clear to ausculation bilaterally, normal work of breathing Chest- ICD pocket is well healed Heart- Regular rate and rhythm, no murmurs, rubs or gallops, PMI not laterally displaced GI-  soft, NT, ND, + BS Extremities- no clubbing, cyanosis, or edema  ICD interrogation- reviewed in detail today,  See PACEART report   Dr [name]' office notes are reviewed I have also discussed with Dr Anne Fu who will see the patient today  Assessment and Plan:  1. Chronic systolic dysfunction  euvolemic today.  EF appears to have responded to CRT. Normal BiV ICD function See Pace Art report No changes today She is approaching ERI battery status (5 months) Continue merlin device follow-up Proceed with generator change once ERI I will see in 6 months if she has not reached ERI by then  2. HTN Stable No change required today  Merlin follow-up as above She is going to be seeing Dr Anne Fu later today as well.

## 2014-06-22 NOTE — Patient Instructions (Signed)
The current medical regimen is effective;  continue present plan and medications.  Follow up in 6 months with Dr. Skains.  You will receive a letter in the mail 2 months before you are due.  Please call us when you receive this letter to schedule your follow up appointment.  

## 2014-06-24 ENCOUNTER — Ambulatory Visit: Payer: Medicare Other | Admitting: Cardiology

## 2014-07-20 DIAGNOSIS — I5022 Chronic systolic (congestive) heart failure: Secondary | ICD-10-CM | POA: Diagnosis not present

## 2014-07-20 DIAGNOSIS — J449 Chronic obstructive pulmonary disease, unspecified: Secondary | ICD-10-CM | POA: Diagnosis not present

## 2014-07-20 DIAGNOSIS — Z952 Presence of prosthetic heart valve: Secondary | ICD-10-CM | POA: Diagnosis not present

## 2014-07-20 DIAGNOSIS — Z0000 Encounter for general adult medical examination without abnormal findings: Secondary | ICD-10-CM | POA: Diagnosis not present

## 2014-07-20 DIAGNOSIS — E039 Hypothyroidism, unspecified: Secondary | ICD-10-CM | POA: Diagnosis not present

## 2014-07-20 DIAGNOSIS — Z95810 Presence of automatic (implantable) cardiac defibrillator: Secondary | ICD-10-CM | POA: Diagnosis not present

## 2014-07-20 DIAGNOSIS — G4700 Insomnia, unspecified: Secondary | ICD-10-CM | POA: Diagnosis not present

## 2014-07-20 DIAGNOSIS — D509 Iron deficiency anemia, unspecified: Secondary | ICD-10-CM | POA: Diagnosis not present

## 2014-07-20 DIAGNOSIS — E559 Vitamin D deficiency, unspecified: Secondary | ICD-10-CM | POA: Diagnosis not present

## 2014-07-20 DIAGNOSIS — Z1389 Encounter for screening for other disorder: Secondary | ICD-10-CM | POA: Diagnosis not present

## 2014-07-20 DIAGNOSIS — I10 Essential (primary) hypertension: Secondary | ICD-10-CM | POA: Diagnosis not present

## 2014-08-11 DIAGNOSIS — J309 Allergic rhinitis, unspecified: Secondary | ICD-10-CM | POA: Diagnosis not present

## 2014-08-19 DIAGNOSIS — M1712 Unilateral primary osteoarthritis, left knee: Secondary | ICD-10-CM | POA: Diagnosis not present

## 2014-08-26 ENCOUNTER — Other Ambulatory Visit: Payer: Self-pay | Admitting: Internal Medicine

## 2014-08-26 DIAGNOSIS — R1011 Right upper quadrant pain: Secondary | ICD-10-CM

## 2014-08-28 ENCOUNTER — Ambulatory Visit (HOSPITAL_COMMUNITY)
Admission: RE | Admit: 2014-08-28 | Discharge: 2014-08-28 | Disposition: A | Discharge status: Home / Self Care | Payer: Medicare Other | Source: Ambulatory Visit | Attending: Internal Medicine | Admitting: Internal Medicine

## 2014-08-28 DIAGNOSIS — R1011 Right upper quadrant pain: Secondary | ICD-10-CM | POA: Insufficient documentation

## 2014-08-31 DIAGNOSIS — R109 Unspecified abdominal pain: Secondary | ICD-10-CM | POA: Diagnosis not present

## 2014-09-02 DIAGNOSIS — R1011 Right upper quadrant pain: Secondary | ICD-10-CM | POA: Diagnosis not present

## 2014-09-03 ENCOUNTER — Other Ambulatory Visit: Payer: Self-pay | Admitting: Internal Medicine

## 2014-09-03 DIAGNOSIS — R109 Unspecified abdominal pain: Secondary | ICD-10-CM

## 2014-09-04 ENCOUNTER — Ambulatory Visit (HOSPITAL_COMMUNITY)
Admission: RE | Admit: 2014-09-04 | Discharge: 2014-09-04 | Disposition: A | Discharge status: Home / Self Care | Payer: Medicare Other | Source: Ambulatory Visit | Attending: Internal Medicine | Admitting: Internal Medicine

## 2014-09-04 DIAGNOSIS — R109 Unspecified abdominal pain: Secondary | ICD-10-CM

## 2014-09-04 MED ORDER — IOHEXOL 300 MG/ML  SOLN
80.0000 mL | Freq: Once | INTRAMUSCULAR | Status: AC | PRN
  Administered 2014-09-04: 80 mL via INTRAVENOUS

## 2014-09-08 ENCOUNTER — Other Ambulatory Visit: Payer: Medicare Other

## 2014-09-10 ENCOUNTER — Encounter: Payer: Self-pay | Admitting: Internal Medicine

## 2014-09-29 ENCOUNTER — Emergency Department (HOSPITAL_COMMUNITY): Payer: Medicare Other

## 2014-09-29 ENCOUNTER — Inpatient Hospital Stay (HOSPITAL_COMMUNITY): Payer: Medicare Other

## 2014-09-29 ENCOUNTER — Other Ambulatory Visit (HOSPITAL_COMMUNITY): Payer: Self-pay | Admitting: Orthopaedic Surgery

## 2014-09-29 ENCOUNTER — Inpatient Hospital Stay (HOSPITAL_COMMUNITY): Payer: Medicare Other | Admitting: Anesthesiology

## 2014-09-29 ENCOUNTER — Encounter (HOSPITAL_COMMUNITY): Payer: Self-pay | Admitting: Emergency Medicine

## 2014-09-29 ENCOUNTER — Encounter (HOSPITAL_COMMUNITY)
Admission: EM | Disposition: A | Discharge status: Skilled Nursing Facility | Payer: Self-pay | Source: Home / Self Care | Attending: Internal Medicine

## 2014-09-29 ENCOUNTER — Inpatient Hospital Stay (HOSPITAL_COMMUNITY)
Admission: EM | Admit: 2014-09-29 | Discharge: 2014-10-03 | DRG: 481 | Disposition: A | Discharge status: Skilled Nursing Facility | Payer: Medicare Other | Attending: Internal Medicine | Admitting: Internal Medicine

## 2014-09-29 DIAGNOSIS — Z79891 Long term (current) use of opiate analgesic: Secondary | ICD-10-CM | POA: Diagnosis not present

## 2014-09-29 DIAGNOSIS — Z881 Allergy status to other antibiotic agents status: Secondary | ICD-10-CM

## 2014-09-29 DIAGNOSIS — R4182 Altered mental status, unspecified: Secondary | ICD-10-CM | POA: Diagnosis not present

## 2014-09-29 DIAGNOSIS — Z8249 Family history of ischemic heart disease and other diseases of the circulatory system: Secondary | ICD-10-CM | POA: Diagnosis not present

## 2014-09-29 DIAGNOSIS — K5900 Constipation, unspecified: Secondary | ICD-10-CM | POA: Diagnosis present

## 2014-09-29 DIAGNOSIS — E039 Hypothyroidism, unspecified: Secondary | ICD-10-CM | POA: Diagnosis present

## 2014-09-29 DIAGNOSIS — E876 Hypokalemia: Secondary | ICD-10-CM | POA: Diagnosis not present

## 2014-09-29 DIAGNOSIS — I7300 Raynaud's syndrome without gangrene: Secondary | ICD-10-CM | POA: Diagnosis present

## 2014-09-29 DIAGNOSIS — Z90710 Acquired absence of both cervix and uterus: Secondary | ICD-10-CM

## 2014-09-29 DIAGNOSIS — Z79899 Other long term (current) drug therapy: Secondary | ICD-10-CM

## 2014-09-29 DIAGNOSIS — S82432A Displaced oblique fracture of shaft of left fibula, initial encounter for closed fracture: Secondary | ICD-10-CM | POA: Diagnosis not present

## 2014-09-29 DIAGNOSIS — S7222XD Displaced subtrochanteric fracture of left femur, subsequent encounter for closed fracture with routine healing: Secondary | ICD-10-CM | POA: Diagnosis not present

## 2014-09-29 DIAGNOSIS — I10 Essential (primary) hypertension: Secondary | ICD-10-CM | POA: Diagnosis present

## 2014-09-29 DIAGNOSIS — M4185 Other forms of scoliosis, thoracolumbar region: Secondary | ICD-10-CM | POA: Diagnosis present

## 2014-09-29 DIAGNOSIS — S7222XA Displaced subtrochanteric fracture of left femur, initial encounter for closed fracture: Secondary | ICD-10-CM | POA: Diagnosis not present

## 2014-09-29 DIAGNOSIS — I255 Ischemic cardiomyopathy: Secondary | ICD-10-CM | POA: Diagnosis present

## 2014-09-29 DIAGNOSIS — M79605 Pain in left leg: Secondary | ICD-10-CM | POA: Diagnosis not present

## 2014-09-29 DIAGNOSIS — Z951 Presence of aortocoronary bypass graft: Secondary | ICD-10-CM | POA: Diagnosis not present

## 2014-09-29 DIAGNOSIS — Y92018 Other place in single-family (private) house as the place of occurrence of the external cause: Secondary | ICD-10-CM

## 2014-09-29 DIAGNOSIS — S72302D Unspecified fracture of shaft of left femur, subsequent encounter for closed fracture with routine healing: Secondary | ICD-10-CM | POA: Diagnosis not present

## 2014-09-29 DIAGNOSIS — Z95810 Presence of automatic (implantable) cardiac defibrillator: Secondary | ICD-10-CM | POA: Diagnosis present

## 2014-09-29 DIAGNOSIS — Z888 Allergy status to other drugs, medicaments and biological substances status: Secondary | ICD-10-CM | POA: Diagnosis not present

## 2014-09-29 DIAGNOSIS — D62 Acute posthemorrhagic anemia: Secondary | ICD-10-CM | POA: Diagnosis not present

## 2014-09-29 DIAGNOSIS — I25708 Atherosclerosis of coronary artery bypass graft(s), unspecified, with other forms of angina pectoris: Secondary | ICD-10-CM | POA: Diagnosis not present

## 2014-09-29 DIAGNOSIS — I5022 Chronic systolic (congestive) heart failure: Secondary | ICD-10-CM | POA: Diagnosis present

## 2014-09-29 DIAGNOSIS — J302 Other seasonal allergic rhinitis: Secondary | ICD-10-CM | POA: Diagnosis not present

## 2014-09-29 DIAGNOSIS — E0590 Thyrotoxicosis, unspecified without thyrotoxic crisis or storm: Secondary | ICD-10-CM | POA: Diagnosis present

## 2014-09-29 DIAGNOSIS — K219 Gastro-esophageal reflux disease without esophagitis: Secondary | ICD-10-CM | POA: Diagnosis present

## 2014-09-29 DIAGNOSIS — Z825 Family history of asthma and other chronic lower respiratory diseases: Secondary | ICD-10-CM | POA: Diagnosis not present

## 2014-09-29 DIAGNOSIS — Z9849 Cataract extraction status, unspecified eye: Secondary | ICD-10-CM

## 2014-09-29 DIAGNOSIS — Z952 Presence of prosthetic heart valve: Secondary | ICD-10-CM | POA: Diagnosis not present

## 2014-09-29 DIAGNOSIS — R488 Other symbolic dysfunctions: Secondary | ICD-10-CM | POA: Diagnosis not present

## 2014-09-29 DIAGNOSIS — Z882 Allergy status to sulfonamides status: Secondary | ICD-10-CM | POA: Diagnosis not present

## 2014-09-29 DIAGNOSIS — T148 Other injury of unspecified body region: Secondary | ICD-10-CM | POA: Diagnosis not present

## 2014-09-29 DIAGNOSIS — M479 Spondylosis, unspecified: Secondary | ICD-10-CM | POA: Diagnosis present

## 2014-09-29 DIAGNOSIS — D500 Iron deficiency anemia secondary to blood loss (chronic): Secondary | ICD-10-CM | POA: Diagnosis present

## 2014-09-29 DIAGNOSIS — Z886 Allergy status to analgesic agent status: Secondary | ICD-10-CM | POA: Diagnosis not present

## 2014-09-29 DIAGNOSIS — Z880 Allergy status to penicillin: Secondary | ICD-10-CM | POA: Diagnosis not present

## 2014-09-29 DIAGNOSIS — G8911 Acute pain due to trauma: Secondary | ICD-10-CM | POA: Diagnosis not present

## 2014-09-29 DIAGNOSIS — S7292XA Unspecified fracture of left femur, initial encounter for closed fracture: Secondary | ICD-10-CM | POA: Diagnosis present

## 2014-09-29 DIAGNOSIS — M25552 Pain in left hip: Secondary | ICD-10-CM | POA: Diagnosis not present

## 2014-09-29 DIAGNOSIS — J449 Chronic obstructive pulmonary disease, unspecified: Secondary | ICD-10-CM | POA: Diagnosis not present

## 2014-09-29 DIAGNOSIS — Z885 Allergy status to narcotic agent status: Secondary | ICD-10-CM

## 2014-09-29 DIAGNOSIS — I2510 Atherosclerotic heart disease of native coronary artery without angina pectoris: Secondary | ICD-10-CM | POA: Diagnosis present

## 2014-09-29 DIAGNOSIS — S72102A Unspecified trochanteric fracture of left femur, initial encounter for closed fracture: Secondary | ICD-10-CM | POA: Diagnosis not present

## 2014-09-29 DIAGNOSIS — Z800 Family history of malignant neoplasm of digestive organs: Secondary | ICD-10-CM

## 2014-09-29 DIAGNOSIS — S7292XD Unspecified fracture of left femur, subsequent encounter for closed fracture with routine healing: Secondary | ICD-10-CM | POA: Diagnosis not present

## 2014-09-29 DIAGNOSIS — W19XXXA Unspecified fall, initial encounter: Secondary | ICD-10-CM

## 2014-09-29 DIAGNOSIS — R0902 Hypoxemia: Secondary | ICD-10-CM | POA: Diagnosis not present

## 2014-09-29 DIAGNOSIS — Z9889 Other specified postprocedural states: Secondary | ICD-10-CM

## 2014-09-29 DIAGNOSIS — S299XXA Unspecified injury of thorax, initial encounter: Secondary | ICD-10-CM | POA: Diagnosis not present

## 2014-09-29 DIAGNOSIS — W11XXXA Fall on and from ladder, initial encounter: Secondary | ICD-10-CM | POA: Diagnosis present

## 2014-09-29 DIAGNOSIS — Z96652 Presence of left artificial knee joint: Secondary | ICD-10-CM | POA: Diagnosis present

## 2014-09-29 DIAGNOSIS — R279 Unspecified lack of coordination: Secondary | ICD-10-CM | POA: Diagnosis not present

## 2014-09-29 DIAGNOSIS — Z419 Encounter for procedure for purposes other than remedying health state, unspecified: Secondary | ICD-10-CM

## 2014-09-29 DIAGNOSIS — M6281 Muscle weakness (generalized): Secondary | ICD-10-CM | POA: Diagnosis not present

## 2014-09-29 DIAGNOSIS — S72002A Fracture of unspecified part of neck of left femur, initial encounter for closed fracture: Secondary | ICD-10-CM

## 2014-09-29 DIAGNOSIS — Z7982 Long term (current) use of aspirin: Secondary | ICD-10-CM | POA: Diagnosis not present

## 2014-09-29 DIAGNOSIS — W19XXXD Unspecified fall, subsequent encounter: Secondary | ICD-10-CM | POA: Diagnosis not present

## 2014-09-29 DIAGNOSIS — S72332A Displaced oblique fracture of shaft of left femur, initial encounter for closed fracture: Secondary | ICD-10-CM | POA: Diagnosis present

## 2014-09-29 DIAGNOSIS — I509 Heart failure, unspecified: Secondary | ICD-10-CM | POA: Diagnosis not present

## 2014-09-29 HISTORY — PX: FEMUR IM NAIL: SHX1597

## 2014-09-29 LAB — CBC WITH DIFFERENTIAL/PLATELET
BASOS ABS: 0 10*3/uL (ref 0.0–0.1)
Basophils Relative: 0 % (ref 0–1)
Eosinophils Absolute: 0.2 10*3/uL (ref 0.0–0.7)
Eosinophils Relative: 2 % (ref 0–5)
HEMATOCRIT: 36.5 % (ref 36.0–46.0)
Hemoglobin: 11.7 g/dL — ABNORMAL LOW (ref 12.0–15.0)
LYMPHS PCT: 9 % — AB (ref 12–46)
Lymphs Abs: 1 10*3/uL (ref 0.7–4.0)
MCH: 28.7 pg (ref 26.0–34.0)
MCHC: 32.1 g/dL (ref 30.0–36.0)
MCV: 89.5 fL (ref 78.0–100.0)
MONO ABS: 0.7 10*3/uL (ref 0.1–1.0)
Monocytes Relative: 6 % (ref 3–12)
NEUTROS ABS: 9.6 10*3/uL — AB (ref 1.7–7.7)
Neutrophils Relative %: 83 % — ABNORMAL HIGH (ref 43–77)
PLATELETS: 184 10*3/uL (ref 150–400)
RBC: 4.08 MIL/uL (ref 3.87–5.11)
RDW: 15.7 % — AB (ref 11.5–15.5)
WBC: 11.5 10*3/uL — AB (ref 4.0–10.5)

## 2014-09-29 LAB — BASIC METABOLIC PANEL
ANION GAP: 8 (ref 5–15)
BUN: 28 mg/dL — AB (ref 6–23)
CO2: 24 mmol/L (ref 19–32)
CREATININE: 1.12 mg/dL — AB (ref 0.50–1.10)
Calcium: 9.4 mg/dL (ref 8.4–10.5)
Chloride: 105 mEq/L (ref 96–112)
GFR calc Af Amer: 53 mL/min — ABNORMAL LOW (ref >90)
GFR calc non Af Amer: 45 mL/min — ABNORMAL LOW (ref >90)
Glucose, Bld: 163 mg/dL — ABNORMAL HIGH (ref 70–99)
Potassium: 4.9 mmol/L (ref 3.5–5.1)
Sodium: 137 mmol/L (ref 135–145)

## 2014-09-29 LAB — SURGICAL PCR SCREEN
MRSA, PCR: POSITIVE — AB
Staphylococcus aureus: POSITIVE — AB

## 2014-09-29 LAB — PROTIME-INR
INR: 1.04 (ref 0.00–1.49)
Prothrombin Time: 13.8 seconds (ref 11.6–15.2)

## 2014-09-29 SURGERY — INSERTION, INTRAMEDULLARY ROD, FEMUR
Anesthesia: General | Site: Hip | Laterality: Left

## 2014-09-29 MED ORDER — BISACODYL 10 MG RE SUPP: 10.0000 mg | Freq: Every day | RECTAL | Status: DC | PRN

## 2014-09-29 MED ORDER — MEPERIDINE HCL 25 MG/ML IJ SOLN: 6.2500 mg | INTRAMUSCULAR | Status: DC | PRN

## 2014-09-29 MED ORDER — LIDOCAINE HCL (CARDIAC) 20 MG/ML IV SOLN
INTRAVENOUS | Status: AC
  Filled 2014-09-29: qty 5

## 2014-09-29 MED ORDER — PROPOFOL 10 MG/ML IV BOLUS
INTRAVENOUS | Status: AC
  Filled 2014-09-29: qty 20

## 2014-09-29 MED ORDER — ONDANSETRON HCL 4 MG/2ML IJ SOLN: 4.0000 mg | Freq: Four times a day (QID) | INTRAMUSCULAR | Status: DC | PRN

## 2014-09-29 MED ORDER — FENTANYL CITRATE 0.05 MG/ML IJ SOLN
25.0000 ug | INTRAMUSCULAR | Status: DC | PRN
  Administered 2014-09-29 (×2): 25 ug via INTRAVENOUS

## 2014-09-29 MED ORDER — ONDANSETRON HCL 4 MG/2ML IJ SOLN
INTRAMUSCULAR | Status: AC
  Filled 2014-09-29: qty 2

## 2014-09-29 MED ORDER — FENTANYL CITRATE 0.05 MG/ML IJ SOLN
50.0000 ug | Freq: Once | INTRAMUSCULAR | Status: AC
  Administered 2014-09-29: 50 ug via INTRAVENOUS
  Filled 2014-09-29: qty 2

## 2014-09-29 MED ORDER — 0.9 % SODIUM CHLORIDE (POUR BTL) OPTIME
TOPICAL | Status: DC | PRN
  Administered 2014-09-29: 1000 mL

## 2014-09-29 MED ORDER — ONDANSETRON HCL 4 MG/2ML IJ SOLN
INTRAMUSCULAR | Status: DC | PRN
Start: 2014-09-29 — End: 2014-09-29
  Administered 2014-09-29: 4 mg via INTRAVENOUS

## 2014-09-29 MED ORDER — IRBESARTAN 75 MG PO TABS
75.0000 mg | ORAL_TABLET | Freq: Every day | ORAL | Status: DC
  Administered 2014-09-30 – 2014-10-03 (×4): 75 mg via ORAL
  Filled 2014-09-29 (×4): qty 1

## 2014-09-29 MED ORDER — BUPIVACAINE HCL (PF) 0.5 % IJ SOLN
INTRAMUSCULAR | Status: DC | PRN
  Administered 2014-09-29: 10 mL

## 2014-09-29 MED ORDER — PANTOPRAZOLE SODIUM 40 MG PO TBEC
40.0000 mg | DELAYED_RELEASE_TABLET | Freq: Two times a day (BID) | ORAL | Status: DC
  Administered 2014-09-29 – 2014-10-03 (×8): 40 mg via ORAL
  Filled 2014-09-29 (×10): qty 1

## 2014-09-29 MED ORDER — PHENYLEPHRINE HCL 10 MG/ML IJ SOLN
INTRAMUSCULAR | Status: AC
  Filled 2014-09-29: qty 1

## 2014-09-29 MED ORDER — CEFAZOLIN SODIUM-DEXTROSE 2-3 GM-% IV SOLR
INTRAVENOUS | Status: AC
  Filled 2014-09-29: qty 50

## 2014-09-29 MED ORDER — TRAMADOL HCL 50 MG PO TABS: 50.0000 mg | ORAL_TABLET | Freq: Four times a day (QID) | ORAL | Status: DC | PRN

## 2014-09-29 MED ORDER — METOCLOPRAMIDE HCL 5 MG/ML IJ SOLN: 5.0000 mg | Freq: Three times a day (TID) | INTRAMUSCULAR | Status: DC | PRN

## 2014-09-29 MED ORDER — SODIUM CHLORIDE 0.9 % IV SOLN: INTRAVENOUS | Status: AC

## 2014-09-29 MED ORDER — CARVEDILOL 12.5 MG PO TABS
12.5000 mg | ORAL_TABLET | Freq: Two times a day (BID) | ORAL | Status: DC
  Administered 2014-09-30 – 2014-10-03 (×7): 12.5 mg via ORAL
  Filled 2014-09-29 (×11): qty 1

## 2014-09-29 MED ORDER — FENTANYL CITRATE 0.05 MG/ML IJ SOLN
INTRAMUSCULAR | Status: AC
  Filled 2014-09-29: qty 2

## 2014-09-29 MED ORDER — MENTHOL 3 MG MT LOZG
1.0000 | LOZENGE | OROMUCOSAL | Status: DC | PRN
  Filled 2014-09-29: qty 9

## 2014-09-29 MED ORDER — MUPIROCIN 2 % EX OINT
1.0000 "application " | TOPICAL_OINTMENT | Freq: Two times a day (BID) | CUTANEOUS | Status: DC
  Administered 2014-09-29 – 2014-10-03 (×8): 1 via NASAL
  Filled 2014-09-29: qty 22

## 2014-09-29 MED ORDER — SODIUM CHLORIDE 0.45 % IV SOLN
INTRAVENOUS | Status: DC
  Administered 2014-09-29: 22:00:00 via INTRAVENOUS

## 2014-09-29 MED ORDER — EPHEDRINE SULFATE 50 MG/ML IJ SOLN
INTRAMUSCULAR | Status: DC | PRN
  Administered 2014-09-29: 10 mg via INTRAVENOUS

## 2014-09-29 MED ORDER — FENTANYL CITRATE 0.05 MG/ML IJ SOLN
INTRAMUSCULAR | Status: DC | PRN
  Administered 2014-09-29 (×4): 50 ug via INTRAVENOUS

## 2014-09-29 MED ORDER — CEFAZOLIN SODIUM-DEXTROSE 2-3 GM-% IV SOLR
2.0000 g | INTRAVENOUS | Status: AC
  Administered 2014-09-29: 2 g via INTRAVENOUS

## 2014-09-29 MED ORDER — PROMETHAZINE HCL 25 MG/ML IJ SOLN: 6.2500 mg | INTRAMUSCULAR | Status: DC | PRN

## 2014-09-29 MED ORDER — PHENYLEPHRINE HCL 10 MG/ML IJ SOLN
10.0000 mg | INTRAVENOUS | Status: DC | PRN
  Administered 2014-09-29: 50 ug/min via INTRAVENOUS

## 2014-09-29 MED ORDER — SODIUM CHLORIDE 0.9 % IV SOLN: INTRAVENOUS | Status: DC

## 2014-09-29 MED ORDER — METOCLOPRAMIDE HCL 10 MG PO TABS: 5.0000 mg | ORAL_TABLET | Freq: Three times a day (TID) | ORAL | Status: DC | PRN

## 2014-09-29 MED ORDER — DOCUSATE SODIUM 100 MG PO CAPS
100.0000 mg | ORAL_CAPSULE | Freq: Two times a day (BID) | ORAL | Status: DC
  Filled 2014-09-29: qty 1

## 2014-09-29 MED ORDER — BUPIVACAINE HCL (PF) 0.5 % IJ SOLN
INTRAMUSCULAR | Status: AC
  Filled 2014-09-29: qty 30

## 2014-09-29 MED ORDER — ONDANSETRON HCL 4 MG PO TABS: 4.0000 mg | ORAL_TABLET | Freq: Four times a day (QID) | ORAL | Status: DC | PRN

## 2014-09-29 MED ORDER — PROPOFOL 10 MG/ML IV BOLUS
INTRAVENOUS | Status: DC | PRN
  Administered 2014-09-29: 80 mg via INTRAVENOUS

## 2014-09-29 MED ORDER — CHLORHEXIDINE GLUCONATE CLOTH 2 % EX PADS
6.0000 | MEDICATED_PAD | Freq: Every day | CUTANEOUS | Status: DC
Start: 2014-09-30 — End: 2014-10-03
  Administered 2014-09-30 – 2014-10-03 (×4): 6 via TOPICAL

## 2014-09-29 MED ORDER — FENTANYL CITRATE 0.05 MG/ML IJ SOLN
25.0000 ug | INTRAMUSCULAR | Status: DC | PRN
  Administered 2014-09-29 – 2014-10-01 (×6): 50 ug via INTRAVENOUS
  Administered 2014-10-02: 25 ug via INTRAVENOUS
  Administered 2014-10-02: 50 ug via INTRAVENOUS
  Filled 2014-09-29 (×8): qty 2

## 2014-09-29 MED ORDER — SUCCINYLCHOLINE CHLORIDE 20 MG/ML IJ SOLN
INTRAMUSCULAR | Status: DC | PRN
  Administered 2014-09-29: 60 mg via INTRAVENOUS

## 2014-09-29 MED ORDER — ACETAMINOPHEN 325 MG PO TABS: 650.0000 mg | ORAL_TABLET | Freq: Four times a day (QID) | ORAL | Status: DC | PRN

## 2014-09-29 MED ORDER — LIDOCAINE HCL (PF) 2 % IJ SOLN
INTRAMUSCULAR | Status: DC | PRN
  Administered 2014-09-29: 40 mg via INTRADERMAL

## 2014-09-29 MED ORDER — LEVOTHYROXINE SODIUM 75 MCG PO TABS
75.0000 ug | ORAL_TABLET | Freq: Every day | ORAL | Status: DC
  Administered 2014-09-30 – 2014-10-03 (×4): 75 ug via ORAL
  Filled 2014-09-29 (×6): qty 1

## 2014-09-29 MED ORDER — DOCUSATE SODIUM 100 MG PO CAPS
100.0000 mg | ORAL_CAPSULE | Freq: Two times a day (BID) | ORAL | Status: DC
  Administered 2014-09-30 – 2014-10-03 (×6): 100 mg via ORAL
  Filled 2014-09-29 (×9): qty 1

## 2014-09-29 MED ORDER — PHENOL 1.4 % MT LIQD
1.0000 | OROMUCOSAL | Status: DC | PRN
  Filled 2014-09-29: qty 177

## 2014-09-29 MED ORDER — LACTATED RINGERS IV SOLN
INTRAVENOUS | Status: DC | PRN
  Administered 2014-09-29: 18:00:00 via INTRAVENOUS

## 2014-09-29 SURGICAL SUPPLY — 37 items
BAG ZIPLOCK 12X15 (MISCELLANEOUS) ×3 IMPLANT
BIT DRILL 4.3MMS DISTAL GRDTED (BIT) ×1 IMPLANT
BNDG COHESIVE 4X5 TAN STRL (GAUZE/BANDAGES/DRESSINGS) ×3 IMPLANT
DRAPE STERI IOBAN 125X83 (DRAPES) ×3 IMPLANT
DRAPE TABLE BACK 44X90 PK DISP (DRAPES) ×3 IMPLANT
DRILL 4.3MMS DISTAL GRADUATED (BIT) ×3
DRSG PAD ABDOMINAL 8X10 ST (GAUZE/BANDAGES/DRESSINGS) ×3 IMPLANT
DURAPREP 26ML APPLICATOR (WOUND CARE) ×3 IMPLANT
ELECT REM PT RETURN 9FT ADLT (ELECTROSURGICAL) ×3
ELECTRODE REM PT RTRN 9FT ADLT (ELECTROSURGICAL) ×1 IMPLANT
EVACUATOR 1/8 PVC DRAIN (DRAIN) IMPLANT
GAUZE SPONGE 4X4 12PLY STRL (GAUZE/BANDAGES/DRESSINGS) ×3 IMPLANT
GAUZE XEROFORM 5X9 LF (GAUZE/BANDAGES/DRESSINGS) IMPLANT
GLOVE ORTHO TXT STRL SZ7.5 (GLOVE) ×3 IMPLANT
GOWN STRL REUS W/TWL LRG LVL3 (GOWN DISPOSABLE) ×6 IMPLANT
GUIDEPIN 3.2X17.5 THRD DISP (PIN) ×3 IMPLANT
GUIDEWIRE BALL NOSE 100CM (WIRE) ×3 IMPLANT
HIP FRA NAIL LAG SCREW 10.5X90 (Orthopedic Implant) ×3 IMPLANT
KIT BASIN OR (CUSTOM PROCEDURE TRAY) ×3 IMPLANT
NEEDLE HYPO 22GX1.5 SAFETY (NEEDLE) ×3 IMPLANT
NEEDLE HYPO 25X1 1.5 SAFETY (NEEDLE) ×3 IMPLANT
PACK GENERAL/GYN (CUSTOM PROCEDURE TRAY) ×3 IMPLANT
POSITIONER SURGICAL ARM (MISCELLANEOUS) ×3 IMPLANT
SCREW BONE CORTICAL 5.0X38 (Screw) ×3 IMPLANT
SCREW BONE CORTICAL 5.0X42 (Screw) ×3 IMPLANT
SCREW LAG HIP FRA NAIL 10.5X90 (Orthopedic Implant) ×1 IMPLANT
SCREW LAG HIP NAIL 11X320 (Screw) ×3 IMPLANT
STAPLER VISISTAT (STAPLE) ×3 IMPLANT
STAPLER VISISTAT 35W (STAPLE) ×3 IMPLANT
SUT ETHILON 2 0 PS N (SUTURE) IMPLANT
SUT VIC AB 1 CT1 27 (SUTURE) ×2
SUT VIC AB 1 CT1 27XBRD ANTBC (SUTURE) ×1 IMPLANT
SUT VIC AB 2-0 CT1 27 (SUTURE) ×2
SUT VIC AB 2-0 CT1 TAPERPNT 27 (SUTURE) ×1 IMPLANT
SYR 20CC LL (SYRINGE) ×3 IMPLANT
TAPE CLOTH SURG 4X10 WHT LF (GAUZE/BANDAGES/DRESSINGS) ×3 IMPLANT
TOWEL OR 17X26 10 PK STRL BLUE (TOWEL DISPOSABLE) ×3 IMPLANT

## 2014-09-29 NOTE — Transfer of Care (Signed)
Immediate Anesthesia Transfer of Care Note  Patient: [name]  Procedure(s) Performed: Procedure(s) (LRB): Affixus Trochanteric Femoral Nail (Left)  Patient Location: PACU  Anesthesia Type: General  Level of Consciousness: sedated, patient cooperative and responds to stimulation  Airway & Oxygen Therapy: Patient Spontanous Breathing and Patient connected to face mask oxgen  Post-op Assessment: Report given to PACU RN and Post -op Vital signs reviewed and stable  Post vital signs: Reviewed and stable  Complications: No apparent anesthesia complications

## 2014-09-29 NOTE — Anesthesia Postprocedure Evaluation (Signed)
Anesthesia Post Note  Patient: [name]  Procedure(s) Performed: Procedure(s) (LRB): Affixus Trochanteric Femoral Nail (Left)  Anesthesia type: General  Patient location: PACU  Post pain: Pain level controlled  Post assessment: Post-op Vital signs reviewed  Last Vitals: BP 227/77 mmHg  Pulse 60  Temp(Src) 36.3 C (Oral)  Resp 22  SpO2 98%  Post vital signs: Reviewed  Level of consciousness: sedated  Complications: No apparent anesthesia complications

## 2014-09-29 NOTE — ED Notes (Signed)
MD at bedside. 

## 2014-09-29 NOTE — Anesthesia Preprocedure Evaluation (Addendum)
Anesthesia Evaluation  Patient identified by MRN, date of birth, ID band Patient awake    Reviewed: Allergy & Precautions, H&P , Patient's Chart, lab work & pertinent test results, reviewed documented beta blocker date and time   History of Anesthesia Complications (+) PONV and history of anesthetic complications  Airway Mallampati: II  TM Distance: >3 FB Neck ROM: full    Dental   Pulmonary shortness of breath, asthma , pneumonia -, COPD breath sounds clear to auscultation        Cardiovascular Exercise Tolerance: Good hypertension, + CAD, + Peripheral Vascular Disease and +CHF + dysrhythmias + pacemaker + Cardiac Defibrillator Rhythm:regular Rate:Normal     Neuro/Psych    GI/Hepatic GERD-  ,  Endo/Other  Hypothyroidism Hyperthyroidism   Renal/GU Renal disease     Musculoskeletal  (+) Arthritis -,   Abdominal   Peds  Hematology  (+) anemia ,   Anesthesia Other Findings S/p mitral valve repair S/p cabg in 2012 AICD- biventricular pacer Normal myoview in 9/15  Reproductive/Obstetrics                            Anesthesia Physical Anesthesia Plan  ASA: III and emergent  Anesthesia Plan: General ETT   Post-op Pain Management:    Induction:   Airway Management Planned:   Additional Equipment:   Intra-op Plan:   Post-operative Plan:   Informed Consent: I have reviewed the patients History and Physical, chart, labs and discussed the procedure including the risks, benefits and alternatives for the proposed anesthesia with the patient or authorized representative who has indicated his/her understanding and acceptance.   Dental Advisory Given  Plan Discussed with: CRNA and Surgeon  Anesthesia Plan Comments:        Will have a-line ready in case of hemodynamic instability, IV neo gtt, magnet, and pacer pads.  Will place cautery pad below the pelvis.  Discussed plan of care with  Dr. Ophelia Charter. Anesthesia Quick Evaluation

## 2014-09-29 NOTE — H&P (View-Only) (Signed)
Reason for Consult:left subtroch femur shaft fracture Referring Physician:Krishnan MD  Sacred I [name] is an 81 y.o. female.  HPI:  [place] off step stool in hall cleaning closet with left femur shaft Fx. Has left TKA by Dr. Sharol Given  Past Medical History  Diagnosis Date  . Ischemic cardiomyopathy     severe. Left ventricular ejection fraction 20%.   . Chronic systolic heart failure     NYHA class II.  [name] Chronic pulmonary disease   . BBB (bundle branch block)     s/p BiV ICD implant  . Raynaud's syndrome   . Neuromuscular scoliosis of thoracolumbar region     type of scoliosis was not specified.   [name] DJD (degenerative joint disease), cervical   . DJD (degenerative joint disease), lumbar   . HTN (hypertension)   . Hyperthyroidism     following Graves disease  . Renal artery stenosis     Treated with angioplast in 1980 and 1987.   . S/P CABG (coronary artery bypass graft) April 2012  . FH: mitral valve repair     with 26 mm Edwards ring angioplasty,   . COPD (chronic obstructive pulmonary disease)   . Full dentures   . PONV (postoperative nausea and vomiting)   . CHF (congestive heart failure)   . Dizziness   . Automatic implantable cardioverter-defibrillator in situ   . Pacemaker   . Asthma   . Pneumonia     hx  . GERD (gastroesophageal reflux disease)   . Anemia     takes iron 3 days per week  . Hx of cardiovascular stress test     Lexiscan Myoview (9/15):  Normal stress nuclear study.  LV Ejection Fraction: 76%    Past Surgical History  Procedure Laterality Date  . Total abdominal hysterectomy    . Sympathectomy    . Tonsillectomy    . Renal artery ballon dilation    . Rotator cuff repair      right and left  . Laminotomy/foraminotomy      with decompression of the L4 nerve root   . Cervical fusion      C5-6 and C6-7, C4-5 with titanium plates  . Umbilical hernia repair    . Wedge resection  2001    for the right upper lobe for Aspergillus treatement.  Dr. Arlyce Dice  apprix 2001.  [name] Ptca      of bilateral renal arteries  . Cataract extraction    . Carpal tunnel release    . Appendectomy    . Breast lumpectomy      left breast  . Renal artery ballon dilation      x2  . Precancerous growth      tops of ear removed. bilateral.   . Implantation of icd  2010    BiV ICD implant (SJM) by Dr Leonia Reeves 03/2009  . Repair extensor tendon  07/10/2012    Procedure: REPAIR EXTENSOR TENDON;  Surgeon: Wynonia Sours, MD;  Location: [place];  Service: Orthopedics;  Laterality: Right;  METACARPAL PHALANGEAL REPLACEMENT ARTHROPLASTIES RIGHT INDEX, MIDDLE, AND RING FINGERS    . Finger arthroplasty  07/10/2012    Procedure: FINGER ARTHROPLASTY;  Surgeon: Wynonia Sours, MD;  Location: [place];  Service: Orthopedics;  Laterality: Right;  METACARPAL PHALANGEAL ARTHROPLASTIES RIGHT INDEX, MIDDLE, AND RING FINGERS   . Esophageal manometry N/A 04/07/2013    Procedure: ESOPHAGEAL MANOMETRY (EM);  Surgeon: Garlan Fair, MD;  Location: [facility];  Service: Endoscopy;  Laterality: N/A;  . Carpometacarpel suspension plasty Right 11/12/2013    Procedure: SUSPENSION PLASTY RIGHT THUMB, TRAPEZIUM EXCISION;  Surgeon: Wynonia Sours, MD;  Location: [facility];  Service: Orthopedics;  Laterality: Right;  . Tendon transfer Right 11/12/2013    Procedure: RIGHT ABDUCTOR POLLICUS LONGUS TENDON TRANSFER;  Surgeon: Wynonia Sours, MD;  Location: [facility];  Service: Orthopedics;  Laterality: Right;  . Tonsillectomy    . Hemorrhoidectomy with hemorrhoid banding    . Facial cosmetic surgery    . Back surgery    . Eye surgery Bilateral     cataracts  . Hernia repair      umbilical  . Lung mass removal Right   . Left knee arthroscopic    . Coronary artery bypass graft  2010    mvr/cabg  . Total knee arthroplasty Left 04/29/2014    Procedure: LEFT TOTAL KNEE ARTHROPLASTY;  Surgeon: Newt Minion, MD;  Location: [place];  Service:  Orthopedics;  Laterality: Left;    Family History  Problem Relation Age of Onset  . Heart disease Father   . Hypertension Father   . Colon cancer      grandmother    Social History:  reports that she has never smoked. She does not have any smokeless tobacco history on file. She reports that she does not drink alcohol. Her drug history is not on file.  Allergies:  Allergies  Allergen Reactions  . Alprazolam Other (See Comments)    REACTION: ulcer's in mouth and extreme constipation  . Doxycycline Other (See Comments)    REACTION: severe rash over entire body  . Fulvicin P-G [Griseofulvin] Other (See Comments)    Severe headaches  . Hydrocodone Other (See Comments)    REACTION: nausea and totally out of it  . Ketoconazole Other (See Comments)    REACTION: terribly weak, voice shook  . Serevent [Salmeterol] Other (See Comments)    shaking  . Calcitonin ([place]) Other (See Comments)    REACTION: rash over entire body  . Morphine And Related Nausea And Vomiting  . Amitriptyline     unknown  . Cefuroxime Axetil Other (See Comments)    REACTION: either rash or diarrhea  . Hydrocodone-Acetaminophen Other (See Comments)    REACTION: reaction forgotten. Lorcet, percodan  . Other     Ketacosol= terribly week  . Sulfamethoxazole-Trimethoprim Other (See Comments)    REACTION: either rash or diarrhea  . Sulfonamide Derivatives Other (See Comments)    REACTION: reaction forgotten  . Trovan [Alatrofloxacin] Nausea And Vomiting  . Aspirin Other (See Comments)    REACTION: upsets stomach  . Cephalexin Rash  . Ciprofloxacin Rash  . Clonazepam Other (See Comments)    REACTION: 1/2 pill makes grogginess next day  . Erythromycin Rash  . Hydromorphone Rash  . Oxycodone-Aspirin Other (See Comments)    REACTION: nausea  . Penicillins Rash  . Tapazole [Thiamazole] Rash    Medications: I have reviewed the patient's current medications.  Results for orders placed or performed during  the hospital encounter of 09/29/14 (from the past 48 hour(s))  Basic metabolic panel     Status: Abnormal   Collection Time: 09/29/14  2:54 PM  Result Value Ref Range   Sodium 137 135 - 145 mmol/L    Comment: Please note change in reference range.   Potassium 4.9 3.5 - 5.1 mmol/L    Comment: Please note change in reference range.   Chloride 105  96 - 112 mEq/L   CO2 24 19 - 32 mmol/L   Glucose, Bld 163 (H) 70 - 99 mg/dL   BUN 28 (H) 6 - 23 mg/dL   Creatinine, Ser 1.12 (H) 0.50 - 1.10 mg/dL   Calcium 9.4 8.4 - 10.5 mg/dL   GFR calc non Af Amer 45 (L) >90 mL/min   GFR calc Af Amer 53 (L) >90 mL/min    Comment: (NOTE) The eGFR has been calculated using the CKD EPI equation. This calculation has not been validated in all clinical situations. eGFR's persistently <90 mL/min signify possible Chronic Kidney Disease.    Anion gap 8 5 - 15  CBC with Differential     Status: Abnormal   Collection Time: 09/29/14  2:54 PM  Result Value Ref Range   WBC 11.5 (H) 4.0 - 10.5 K/uL   RBC 4.08 3.87 - 5.11 MIL/uL   Hemoglobin 11.7 (L) 12.0 - 15.0 g/dL   HCT 36.5 36.0 - 46.0 %   MCV 89.5 78.0 - 100.0 fL   MCH 28.7 26.0 - 34.0 pg   MCHC 32.1 30.0 - 36.0 g/dL   RDW 15.7 (H) 11.5 - 15.5 %   Platelets 184 150 - 400 K/uL   Neutrophils Relative % 83 (H) 43 - 77 %   Neutro Abs 9.6 (H) 1.7 - 7.7 K/uL   Lymphocytes Relative 9 (L) 12 - 46 %   Lymphs Abs 1.0 0.7 - 4.0 K/uL   Monocytes Relative 6 3 - 12 %   Monocytes Absolute 0.7 0.1 - 1.0 K/uL   Eosinophils Relative 2 0 - 5 %   Eosinophils Absolute 0.2 0.0 - 0.7 K/uL   Basophils Relative 0 0 - 1 %   Basophils Absolute 0.0 0.0 - 0.1 K/uL  Protime-INR     Status: None   Collection Time: 09/29/14  2:54 PM  Result Value Ref Range   Prothrombin Time 13.8 11.6 - 15.2 seconds   INR 1.04 0.00 - 1.49    Dg Chest 1 View  09/29/2014   CLINICAL DATA:  Recent fall  EXAM: CHEST - 1 VIEW  COMPARISON:  05/14/1949  FINDINGS: Cardiac shadow is stable. A  defibrillator is again noted. Postsurgical changes consistent with valvular repair are again seen. Cervical spine surgical changes are again noted. The lungs are well aerated with mild interstitial changes stable from the prior exam. Hyperexpansion is again identified. No focal infiltrate or sizable effusion is seen.  IMPRESSION: COPD without acute abnormality.   Electronically Signed   By: Inez Catalina M.D.   On: 09/29/2014 15:35   Dg Femur Min 2 Views Left  09/29/2014   CLINICAL DATA:  Recent fall with leg pain  EXAM: DG FEMUR 2+V*L*  COMPARISON:  None.  FINDINGS: There is an oblique fracture through the proximal diaphysis of the left femur. Mild displacement at the fracture site is noted and as well as rotation of the distal fracture fragment. No other fractures are noted. A left knee replacement is seen.  IMPRESSION: Midshaft oblique fracture of the left femur with mild overlap and angulation of the distal fracture fragment with respect to the proximal fracture fragment.   Electronically Signed   By: Inez Catalina M.D.   On: 09/29/2014 15:38    Review of Systems  Constitutional: Negative for fever and chills.  Respiratory: Negative for cough.   Cardiovascular:       MVR, CABG  EF of 20%  Neurological: Negative for dizziness.   Blood  pressure 227/77, pulse 60, temperature 97.4 F (36.3 [name]), temperature source Oral, resp. rate 22, SpO2 98 %. Physical Exam  Constitutional: She is oriented to person, place, and time. She appears well-developed.  HENT:  Head: Normocephalic.  Neck: Normal range of motion.  Cardiovascular: Normal rate.   ? Pacer dependent rythym  Respiratory: Effort normal.  GI: Soft.  Musculoskeletal:  Pain with left LE motion. Pulse intact  Neurological: She is alert and oriented to person, place, and time.  Skin: Skin is warm.  Psychiatric: She has a normal mood and affect. Her behavior is normal. Thought content normal.    Assessment/Plan: Left femoral shaft fracture  for stabilization. May require cerclage wires.   [name] 09/29/2014, 5:49 PM

## 2014-09-29 NOTE — Progress Notes (Addendum)
CSW met with patient at bedside. Husband was present. Patient states that she fell off of a step stool today. Patient informed CSW that she lives at [facility] in their [place] living unit with her husband. Patient appeared to be in pain during the interview. Patient says that she was able to complete her ADL's independently prior to coming into the ED today.  Husband/Dave Hornik [phone]  .Willette Brace [phone] ED CSW 09/29/2014 4:32 PM

## 2014-09-29 NOTE — ED Notes (Signed)
Floor nurse unable to take report at this time. 

## 2014-09-29 NOTE — ED Notes (Signed)
Bed: WA15 Expected date:  Expected time:  Means of arrival:  Comments: ems 

## 2014-09-29 NOTE — Consult Note (Signed)
Reason for Consult:left subtroch femur shaft fracture Referring Physician:Krishnan MD  [name] is an 81 y.o. female.  HPI:  Fell off step stool in hall cleaning closet with left femur shaft Fx. Has left TKA by Dr. Duda  Past Medical History  Diagnosis Date  . Ischemic cardiomyopathy     severe. Left ventricular ejection fraction 20%.   . Chronic systolic heart failure     NYHA class II.  . Chronic pulmonary disease   . BBB (bundle branch block)     s/p BiV ICD implant  . Raynaud's syndrome   . Neuromuscular scoliosis of thoracolumbar region     type of scoliosis was not specified.   . DJD (degenerative joint disease), cervical   . DJD (degenerative joint disease), lumbar   . HTN (hypertension)   . Hyperthyroidism     following Graves disease  . Renal artery stenosis     Treated with angioplast in 1980 and 1987.   . S/P CABG (coronary artery bypass graft) April 2012  . FH: mitral valve repair     with 26 mm Edwards ring angioplasty,   . COPD (chronic obstructive pulmonary disease)   . Full dentures   . PONV (postoperative nausea and vomiting)   . CHF (congestive heart failure)   . Dizziness   . Automatic implantable cardioverter-defibrillator in situ   . Pacemaker   . Asthma   . Pneumonia     hx  . GERD (gastroesophageal reflux disease)   . Anemia     takes iron 3 days per week  . Hx of cardiovascular stress test     Lexiscan Myoview (9/15):  Normal stress nuclear study.  LV Ejection Fraction: 76%    Past Surgical History  Procedure Laterality Date  . Total abdominal hysterectomy    . Sympathectomy    . Tonsillectomy    . Renal artery ballon dilation    . Rotator cuff repair      right and left  . Laminotomy/foraminotomy      with decompression of the L4 nerve root   . Cervical fusion      C5-6 and C6-7, C4-5 with titanium plates  . Umbilical hernia repair    . Wedge resection  2001    for the right upper lobe for Aspergillus treatement.  Dr. Burney  apprix 2001.  . Ptca      of bilateral renal arteries  . Cataract extraction    . Carpal tunnel release    . Appendectomy    . Breast lumpectomy      left breast  . Renal artery ballon dilation      x2  . Precancerous growth      tops of ear removed. bilateral.   . Implantation of icd  2010    BiV ICD implant (SJM) by Dr Edmunds 03/2009  . Repair extensor tendon  07/10/2012    Procedure: REPAIR EXTENSOR TENDON;  Surgeon: Gary R Kuzma, MD;  Location: [facility];  Service: Orthopedics;  Laterality: Right;  METACARPAL PHALANGEAL REPLACEMENT ARTHROPLASTIES RIGHT INDEX, MIDDLE, AND RING FINGERS    . Finger arthroplasty  07/10/2012    Procedure: FINGER ARTHROPLASTY;  Surgeon: Gary R Kuzma, MD;  Location: [facility];  Service: Orthopedics;  Laterality: Right;  METACARPAL PHALANGEAL ARTHROPLASTIES RIGHT INDEX, MIDDLE, AND RING FINGERS   . Esophageal manometry N/A 04/07/2013    Procedure: ESOPHAGEAL MANOMETRY (EM);  Surgeon: Martin K Johnson, MD;  Location: [facility];    Service: Endoscopy;  Laterality: N/A;  . Carpometacarpel suspension plasty Right 11/12/2013    Procedure: SUSPENSION PLASTY RIGHT THUMB, TRAPEZIUM EXCISION;  Surgeon: Gary R Kuzma, MD;  Location: [facility];  Service: Orthopedics;  Laterality: Right;  . Tendon transfer Right 11/12/2013    Procedure: RIGHT ABDUCTOR POLLICUS LONGUS TENDON TRANSFER;  Surgeon: Gary R Kuzma, MD;  Location: [facility];  Service: Orthopedics;  Laterality: Right;  . Tonsillectomy    . Hemorrhoidectomy with hemorrhoid banding    . Facial cosmetic surgery    . Back surgery    . Eye surgery Bilateral     cataracts  . Hernia repair      umbilical  . Lung mass removal Right   . Left knee arthroscopic    . Coronary artery bypass graft  2010    mvr/cabg  . Total knee arthroplasty Left 04/29/2014    Procedure: LEFT TOTAL KNEE ARTHROPLASTY;  Surgeon: Marcus Duda V, MD;  Location: [facility];  Service:  Orthopedics;  Laterality: Left;    Family History  Problem Relation Age of Onset  . Heart disease Father   . Hypertension Father   . Colon cancer      grandmother    Social History:  reports that she has never smoked. She does not have any smokeless tobacco history on file. She reports that she does not drink alcohol. Her drug history is not on file.  Allergies:  Allergies  Allergen Reactions  . Alprazolam Other (See Comments)    REACTION: ulcer's in mouth and extreme constipation  . Doxycycline Other (See Comments)    REACTION: severe rash over entire body  . Fulvicin P-G [Griseofulvin] Other (See Comments)    Severe headaches  . Hydrocodone Other (See Comments)    REACTION: nausea and totally out of it  . Ketoconazole Other (See Comments)    REACTION: terribly weak, voice shook  . Serevent [Salmeterol] Other (See Comments)    shaking  . Calcitonin ([place]mon) Other (See Comments)    REACTION: rash over entire body  . Morphine And Related Nausea And Vomiting  . Amitriptyline     unknown  . Cefuroxime Axetil Other (See Comments)    REACTION: either rash or diarrhea  . Hydrocodone-Acetaminophen Other (See Comments)    REACTION: reaction forgotten. Lorcet, percodan  . Other     Ketacosol= terribly week  . Sulfamethoxazole-Trimethoprim Other (See Comments)    REACTION: either rash or diarrhea  . Sulfonamide Derivatives Other (See Comments)    REACTION: reaction forgotten  . Trovan [Alatrofloxacin] Nausea And Vomiting  . Aspirin Other (See Comments)    REACTION: upsets stomach  . Cephalexin Rash  . Ciprofloxacin Rash  . Clonazepam Other (See Comments)    REACTION: 1/2 pill makes grogginess next day  . Erythromycin Rash  . Hydromorphone Rash  . Oxycodone-Aspirin Other (See Comments)    REACTION: nausea  . Penicillins Rash  . Tapazole [Thiamazole] Rash    Medications: I have reviewed the patient's current medications.  Results for orders placed or performed during  the hospital encounter of 09/29/14 (from the past 48 hour(s))  Basic metabolic panel     Status: Abnormal   Collection Time: 09/29/14  2:54 PM  Result Value Ref Range   Sodium 137 135 - 145 mmol/L    Comment: Please note change in reference range.   Potassium 4.9 3.5 - 5.1 mmol/L    Comment: Please note change in reference range.   Chloride 105   96 - 112 mEq/L   CO2 24 19 - 32 mmol/L   Glucose, Bld 163 (H) 70 - 99 mg/dL   BUN 28 (H) 6 - 23 mg/dL   Creatinine, Ser 1.12 (H) 0.50 - 1.10 mg/dL   Calcium 9.4 8.4 - 10.5 mg/dL   GFR calc non Af Amer 45 (L) >90 mL/min   GFR calc Af Amer 53 (L) >90 mL/min    Comment: (NOTE) The eGFR has been calculated using the CKD EPI equation. This calculation has not been validated in all clinical situations. eGFR's persistently <90 mL/min signify possible Chronic Kidney Disease.    Anion gap 8 5 - 15  CBC with Differential     Status: Abnormal   Collection Time: 09/29/14  2:54 PM  Result Value Ref Range   WBC 11.5 (H) 4.0 - 10.5 K/uL   RBC 4.08 3.87 - 5.11 MIL/uL   Hemoglobin 11.7 (L) 12.0 - 15.0 g/dL   HCT 36.5 36.0 - 46.0 %   MCV 89.5 78.0 - 100.0 fL   MCH 28.7 26.0 - 34.0 pg   MCHC 32.1 30.0 - 36.0 g/dL   RDW 15.7 (H) 11.5 - 15.5 %   Platelets 184 150 - 400 K/uL   Neutrophils Relative % 83 (H) 43 - 77 %   Neutro Abs 9.6 (H) 1.7 - 7.7 K/uL   Lymphocytes Relative 9 (L) 12 - 46 %   Lymphs Abs 1.0 0.7 - 4.0 K/uL   Monocytes Relative 6 3 - 12 %   Monocytes Absolute 0.7 0.1 - 1.0 K/uL   Eosinophils Relative 2 0 - 5 %   Eosinophils Absolute 0.2 0.0 - 0.7 K/uL   Basophils Relative 0 0 - 1 %   Basophils Absolute 0.0 0.0 - 0.1 K/uL  Protime-INR     Status: None   Collection Time: 09/29/14  2:54 PM  Result Value Ref Range   Prothrombin Time 13.8 11.6 - 15.2 seconds   INR 1.04 0.00 - 1.49    Dg Chest 1 View  09/29/2014   CLINICAL DATA:  Recent fall  EXAM: CHEST - 1 VIEW  COMPARISON:  05/14/1949  FINDINGS: Cardiac shadow is stable. A  defibrillator is again noted. Postsurgical changes consistent with valvular repair are again seen. Cervical spine surgical changes are again noted. The lungs are well aerated with mild interstitial changes stable from the prior exam. Hyperexpansion is again identified. No focal infiltrate or sizable effusion is seen.  IMPRESSION: COPD without acute abnormality.   Electronically Signed   By: Inez Catalina M.D.   On: 09/29/2014 15:35   Dg Femur Min 2 Views Left  09/29/2014   CLINICAL DATA:  Recent fall with leg pain  EXAM: DG FEMUR 2+V*L*  COMPARISON:  None.  FINDINGS: There is an oblique fracture through the proximal diaphysis of the left femur. Mild displacement at the fracture site is noted and as well as rotation of the distal fracture fragment. No other fractures are noted. A left knee replacement is seen.  IMPRESSION: Midshaft oblique fracture of the left femur with mild overlap and angulation of the distal fracture fragment with respect to the proximal fracture fragment.   Electronically Signed   By: Inez Catalina M.D.   On: 09/29/2014 15:38    Review of Systems  Constitutional: Negative for fever and chills.  Respiratory: Negative for cough.   Cardiovascular:       MVR, CABG  EF of 20%  Neurological: Negative for dizziness.   Blood  pressure 227/77, pulse 60, temperature 97.4 F (36.3 C), temperature source Oral, resp. rate 22, SpO2 98 %. Physical Exam  Constitutional: She is oriented to person, place, and time. She appears well-developed.  HENT:  Head: Normocephalic.  Neck: Normal range of motion.  Cardiovascular: Normal rate.   ? Pacer dependent rythym  Respiratory: Effort normal.  GI: Soft.  Musculoskeletal:  Pain with left LE motion. Pulse intact  Neurological: She is alert and oriented to person, place, and time.  Skin: Skin is warm.  Psychiatric: She has a normal mood and affect. Her behavior is normal. Thought content normal.    Assessment/Plan: Left femoral shaft fracture  for stabilization. May require cerclage wires.   Jazlene Bares C 09/29/2014, 5:49 PM

## 2014-09-29 NOTE — Progress Notes (Signed)
Choice offered  HOME HEALTH AGENCIES SERVING [place]   Agencies that are Medicare-Certified and are affiliated with [place]  [facility]  Telephone Number Address  [facility]   The [facility] has ownership interest in this company; however, you are under no obligation to use this agency. [phone] or  [phone] [address] [place], [place] [address] [email or web address]   Agencies that are Medicare-Certified and are not affiliated with [place]                                                                                  Home Health Agency Telephone Number Address  [facility] [phone] Fax [phone] [address], [address] [place], [place]  [address] [email or web address]  [facility] [phone] Fax [phone] [address]   [address] [place], [place]  [address] [email or web address]  [facility] [phone] Fax [phone] [address] [place], [place] [address] [email or web address]  [facility] [phone] Fax [phone] [address], [address] [place], [place]  [address] [email or web address]  [facility] [phone] Fax [phone] [address], [address] [place], [place] [address] [email or web address]  [facility] [phone] [address] [place], [place] [address] [email or web address]  [facility] [phone]  [address] [place], [place] [address] [email or web address]  [facility] [phone] or [phone] Fax number [phone] [address], [address] [place], [place]  [address] [email or web address]  [facility] [phone] Fax [phone] [address] [place], [place]  [address]  [facility]   [phone] Fax [phone] [address] [place], [place] [address] [email or web address]   

## 2014-09-29 NOTE — ED Notes (Signed)
Per EMS-lost balance on step ladder and fell on left femur-found by EMS in pos1tion she fell-left leg has not moved since she fell-100 mcg of Fentanyl given in route

## 2014-09-29 NOTE — H&P (Signed)
[facility] History and Physical  [name] ZSM:[id number] DOB: [birth year] DOA: 09/29/2014   PCP: Henrine Screws, MD  Specialists: Dr. Marlou Porch is her cardiologist  Chief Complaint: Pain in the left hip  HPI: [name] is a 81 y.o. female with a past medical history of coronary artery disease status post CABG, history of mitral valve repair in 2010, history of hypertension, hypothyroidism, history of systolic congestive heart failure previously, but last known EF is about 70%. History was provided by the husband. Patient was given pain medicine and is somewhat groggy. She was in her usual state of health until earlier today when she was on a stepstool cleaning the top of her closet when she lost her balance and fell. She experienced excruciating pain in the left hip area. She could not get up. Her husband came after about 15-20 minutes and found her on the floor. He called EMS. She was brought into the hospital. She denied any chest pain or shortness of breath, either prior to or since the fall. Denies any loss of consciousness. No head injuries. Denies any dizziness/lightheadedness prior to or after the fall. No recent illness or sickness. No fever or chills. No nausea, vomiting. Her pain in the left hip is 10 out of 10 in intensity. She underwent uneventful left knee replacement in August 2015. She had a nuclear stress test in September 2015, which was normal.  Home Medications: Prior to Admission medications   Medication Sig Start Date End Date Taking? Authorizing Provider  acetaminophen (TYLENOL) 500 MG tablet Take 500 mg by mouth every 6 (six) hours as needed for mild pain or moderate pain.   Yes Historical Provider, MD  carvedilol (COREG) 12.5 MG tablet Take 1 tablet (12.5 mg total) by mouth 2 (two) times daily. 04/01/14  Yes Candee Furbish, MD  Cholecalciferol (VITAMIN D3) 2000 UNITS capsule Take 2,000 Units by mouth daily.    Yes Historical Provider, MD  Coenzyme Q10 (COQ10) 100  MG CAPS Take 100 mg by mouth daily.    Yes Historical Provider, MD  fluconazole (DIFLUCAN) 100 MG tablet Take 100 mg by mouth once a week. On Sunday   Yes Historical Provider, MD  levothyroxine (SYNTHROID, LEVOTHROID) 75 MCG tablet Take 75 mcg by mouth daily before breakfast.   Yes Historical Provider, MD  montelukast (SINGULAIR) 10 MG tablet Take 10 mg by mouth daily.    Yes Historical Provider, MD  pantoprazole (PROTONIX) 40 MG tablet Take 40 mg by mouth 2 (two) times daily.   Yes Historical Provider, MD  valsartan (DIOVAN) 80 MG tablet Take 40 mg by mouth daily.   Yes Historical Provider, MD  acetaminophen-codeine (TYLENOL #3) 300-30 MG per tablet Take 1-2 tablets by mouth every 4 (four) hours as needed for moderate pain. Patient not taking: Reported on 09/29/2014 05/01/14   Newt Minion, MD  aspirin EC 81 MG tablet Take 1 tablet (81 mg total) by mouth daily. Patient not taking: Reported on 09/29/2014 06/05/14   Liliane Shi, PA-C    Allergies:  Allergies  Allergen Reactions  . Alprazolam Other (See Comments)    REACTION: ulcer's in mouth and extreme constipation  . Doxycycline Other (See Comments)    REACTION: severe rash over entire body  . Fulvicin P-G [Griseofulvin] Other (See Comments)    Severe headaches  . Hydrocodone Other (See Comments)    REACTION: nausea and totally out of it  . Ketoconazole Other (See Comments)    REACTION: terribly weak, voice  shook  . Serevent [Salmeterol] Other (See Comments)    shaking  . Calcitonin (Salmon) Other (See Comments)    REACTION: rash over entire body  . Morphine And Related Nausea And Vomiting  . Amitriptyline     unknown  . Cefuroxime Axetil Other (See Comments)    REACTION: either rash or diarrhea  . Hydrocodone-Acetaminophen Other (See Comments)    REACTION: reaction forgotten. Lorcet, percodan  . Other     Ketacosol= terribly week  . Sulfamethoxazole-Trimethoprim Other (See Comments)    REACTION: either rash or diarrhea  .  Sulfonamide Derivatives Other (See Comments)    REACTION: reaction forgotten  . Trovan [Alatrofloxacin] Nausea And Vomiting  . Aspirin Other (See Comments)    REACTION: upsets stomach  . Cephalexin Rash  . Ciprofloxacin Rash  . Clonazepam Other (See Comments)    REACTION: 1/2 pill makes grogginess next day  . Erythromycin Rash  . Hydromorphone Rash  . Oxycodone-Aspirin Other (See Comments)    REACTION: nausea  . Penicillins Rash  . Tapazole [Thiamazole] Rash    Past Medical History: Past Medical History  Diagnosis Date  . Ischemic cardiomyopathy     severe. Left ventricular ejection fraction 20%.   . Chronic systolic heart failure     NYHA class II.  [name] Chronic pulmonary disease   . BBB (bundle branch block)     s/p BiV ICD implant  . Raynaud's syndrome   . Neuromuscular scoliosis of thoracolumbar region     type of scoliosis was not specified.   [name] DJD (degenerative joint disease), cervical   . DJD (degenerative joint disease), lumbar   . HTN (hypertension)   . Hyperthyroidism     following Graves disease  . Renal artery stenosis     Treated with angioplast in 1980 and 1987.   . S/P CABG (coronary artery bypass graft) April 2012  . FH: mitral valve repair     with 26 mm Edwards ring angioplasty,   . COPD (chronic obstructive pulmonary disease)   . Full dentures   . PONV (postoperative nausea and vomiting)   . CHF (congestive heart failure)   . Dizziness   . Automatic implantable cardioverter-defibrillator in situ   . Pacemaker   . Asthma   . Pneumonia     hx  . GERD (gastroesophageal reflux disease)   . Anemia     takes iron 3 days per week  . Hx of cardiovascular stress test     Lexiscan Myoview (9/15):  Normal stress nuclear study.  LV Ejection Fraction: 76%    Past Surgical History  Procedure Laterality Date  . Total abdominal hysterectomy    . Sympathectomy    . Tonsillectomy    . Renal artery ballon dilation    . Rotator cuff repair      right and  left  . Laminotomy/foraminotomy      with decompression of the L4 nerve root   . Cervical fusion      C5-6 and C6-7, C4-5 with titanium plates  . Umbilical hernia repair    . Wedge resection  2001    for the right upper lobe for Aspergillus treatement.  Dr. Arlyce Dice apprix 2001.  [name] Ptca      of bilateral renal arteries  . Cataract extraction    . Carpal tunnel release    . Appendectomy    . Breast lumpectomy      left breast  . Renal artery ballon dilation  x2  . Precancerous growth      tops of ear removed. bilateral.   . Implantation of icd  2010    BiV ICD implant (SJM) by Dr Leonia Reeves 03/2009  . Repair extensor tendon  07/10/2012    Procedure: REPAIR EXTENSOR TENDON;  Surgeon: Wynonia Sours, MD;  Location: [place];  Service: Orthopedics;  Laterality: Right;  METACARPAL PHALANGEAL REPLACEMENT ARTHROPLASTIES RIGHT INDEX, MIDDLE, AND RING FINGERS    . Finger arthroplasty  07/10/2012    Procedure: FINGER ARTHROPLASTY;  Surgeon: Wynonia Sours, MD;  Location: [place];  Service: Orthopedics;  Laterality: Right;  METACARPAL PHALANGEAL ARTHROPLASTIES RIGHT INDEX, MIDDLE, AND RING FINGERS   . Esophageal manometry N/A 04/07/2013    Procedure: ESOPHAGEAL MANOMETRY (EM);  Surgeon: Garlan Fair, MD;  Location: [facility];  Service: Endoscopy;  Laterality: N/A;  . Carpometacarpel suspension plasty Right 11/12/2013    Procedure: SUSPENSION PLASTY RIGHT THUMB, TRAPEZIUM EXCISION;  Surgeon: Wynonia Sours, MD;  Location: [place];  Service: Orthopedics;  Laterality: Right;  . Tendon transfer Right 11/12/2013    Procedure: RIGHT ABDUCTOR POLLICUS LONGUS TENDON TRANSFER;  Surgeon: Wynonia Sours, MD;  Location: [place];  Service: Orthopedics;  Laterality: Right;  . Tonsillectomy    . Hemorrhoidectomy with hemorrhoid banding    . Facial cosmetic surgery    . Back surgery    . Eye surgery Bilateral     cataracts  . Hernia repair       umbilical  . Lung mass removal Right   . Left knee arthroscopic    . Coronary artery bypass graft  2010    mvr/cabg  . Total knee arthroplasty Left 04/29/2014    Procedure: LEFT TOTAL KNEE ARTHROPLASTY;  Surgeon: Newt Minion, MD;  Location: [place];  Service: Orthopedics;  Laterality: Left;    Social History: She lives with her husband. No history of smoking, alcohol use or illicit drug use. She is quite independent with daily activities. Does not use a cane or walker.   Family History:  Family History  Problem Relation Age of Onset  . Heart disease Father   . Hypertension Father   . Colon cancer      grandmother     Review of Systems - Unable as patient is partially sedated from narcotics.  Physical Examination  Filed Vitals:   09/29/14 1352 09/29/14 1526  BP: 176/75 179/52  Pulse: 61 60  Temp: 97.8 F (36.6 C)   TempSrc: Oral   Resp: 18 22  SpO2: 98% 99%    BP 179/52 mmHg  Pulse 60  Temp(Src) 97.8 F (36.6 C) (Oral)  Resp 22  SpO2 99%  General appearance: Somewhat groggy but easily arousable. Appears to be in the moderate discomfort. In no distress. Head: Normocephalic, without obvious abnormality, atraumatic Eyes: conjunctivae/corneas clear. PERRL, EOM's intact. Fundi benign. Throat: Dry mucous membranes. Neck: no adenopathy, no carotid bruit, no JVD, supple, symmetrical, trachea midline and thyroid not enlarged, symmetric, no tenderness/mass/nodules Resp: clear to auscultation bilaterally Cardio: regular rate and rhythm, S1, S2 normal, no murmur, click, rub or gallop GI: soft, non-tender; bowel sounds normal; no masses,  no organomegaly Extremities: Left leg actually is bent at the knee. Somewhat of an unusual position. No edema Pulses: 2+ and symmetric Skin: Skin color, texture, turgor normal. No rashes or lesions Lymph nodes: Cervical, supraclavicular, and axillary nodes normal. Neurologic: Groggy but arousable. No focal neurological deficits  noted.  Laboratory Data: Results for orders placed or performed during the hospital encounter of 09/29/14 (from the past 48 hour(s))  Basic metabolic panel     Status: Abnormal   Collection Time: 09/29/14  2:54 PM  Result Value Ref Range   Sodium 137 135 - 145 mmol/L    Comment: Please note change in reference range.   Potassium 4.9 3.5 - 5.1 mmol/L    Comment: Please note change in reference range.   Chloride 105 96 - 112 mEq/L   CO2 24 19 - 32 mmol/L   Glucose, Bld 163 (H) 70 - 99 mg/dL   BUN 28 (H) 6 - 23 mg/dL   Creatinine, Ser 1.12 (H) 0.50 - 1.10 mg/dL   Calcium 9.4 8.4 - 10.5 mg/dL   GFR calc non Af Amer 45 (L) >90 mL/min   GFR calc Af Amer 53 (L) >90 mL/min    Comment: (NOTE) The eGFR has been calculated using the CKD EPI equation. This calculation has not been validated in all clinical situations. eGFR's persistently <90 mL/min signify possible Chronic Kidney Disease.    Anion gap 8 5 - 15  CBC with Differential     Status: Abnormal   Collection Time: 09/29/14  2:54 PM  Result Value Ref Range   WBC 11.5 (H) 4.0 - 10.5 K/uL   RBC 4.08 3.87 - 5.11 MIL/uL   Hemoglobin 11.7 (L) 12.0 - 15.0 g/dL   HCT 36.5 36.0 - 46.0 %   MCV 89.5 78.0 - 100.0 fL   MCH 28.7 26.0 - 34.0 pg   MCHC 32.1 30.0 - 36.0 g/dL   RDW 15.7 (H) 11.5 - 15.5 %   Platelets 184 150 - 400 K/uL   Neutrophils Relative % 83 (H) 43 - 77 %   Neutro Abs 9.6 (H) 1.7 - 7.7 K/uL   Lymphocytes Relative 9 (L) 12 - 46 %   Lymphs Abs 1.0 0.7 - 4.0 K/uL   Monocytes Relative 6 3 - 12 %   Monocytes Absolute 0.7 0.1 - 1.0 K/uL   Eosinophils Relative 2 0 - 5 %   Eosinophils Absolute 0.2 0.0 - 0.7 K/uL   Basophils Relative 0 0 - 1 %   Basophils Absolute 0.0 0.0 - 0.1 K/uL  Protime-INR     Status: None   Collection Time: 09/29/14  2:54 PM  Result Value Ref Range   Prothrombin Time 13.8 11.6 - 15.2 seconds   INR 1.04 0.00 - 1.49    Radiology Reports: Dg Chest 1 View  09/29/2014   CLINICAL DATA:  Recent fall   EXAM: CHEST - 1 VIEW  COMPARISON:  05/14/1949  FINDINGS: Cardiac shadow is stable. A defibrillator is again noted. Postsurgical changes consistent with valvular repair are again seen. Cervical spine surgical changes are again noted. The lungs are well aerated with mild interstitial changes stable from the prior exam. Hyperexpansion is again identified. No focal infiltrate or sizable effusion is seen.  IMPRESSION: COPD without acute abnormality.   Electronically Signed   By: Inez Catalina M.D.   On: 09/29/2014 15:35   Dg Femur Min 2 Views Left  09/29/2014   CLINICAL DATA:  Recent fall with leg pain  EXAM: DG FEMUR 2+V*L*  COMPARISON:  None.  FINDINGS: There is an oblique fracture through the proximal diaphysis of the left femur. Mild displacement at the fracture site is noted and as well as rotation of the distal fracture fragment. No other fractures are noted. A left knee replacement is  seen.  IMPRESSION: Midshaft oblique fracture of the left femur with mild overlap and angulation of the distal fracture fragment with respect to the proximal fracture fragment.   Electronically Signed   By: Inez Catalina M.D.   On: 09/29/2014 15:38    Electrocardiogram: Paced rhythm.  Problem List  Principal Problem:   Femur fracture, left Active Problems:   Automatic implantable cardioverter-defibrillator in situ   S/P CABG (coronary artery bypass graft)   Coronary atherosclerosis of native coronary artery   S/P mitral valve repair   Hypothyroidism   Assessment: This is a 81 year old Caucasian female with past medical history as stated earlier, who also has extensive cardiac history, but had a normal stress test in September of last year. She has good functional capacity. She denies any chest pain or shortness of breath. She has a hip fracture which will require orthopedic surgery, which is intermediate risk procedure.  Plan: #1 left hip fracture: Management per orthopedics. Based on the [facility] Perioperative  Cardiac Risk Index, her estimated risk probability for perioperative myocardial infarction is 0.8%. She may proceed to surgery without additional testing. Pain control.  #2 history of coronary artery disease, status post CABG in 2010: Had a low-risk stress test in September 2015. For some reason she is not on aspirin. This was also noted by her cardiologist back in September. She is on a beta blocker, which will be continued. She'll need to be monitored on telemetry.   #3 history of pacemaker placement with ICD: She has a paced rhythm on the EKG. Blood pressure is stable. Continue to monitor on telemetry.  #4 history of hypothyroidism: Continue with her Synthroid.  #5 history of chronic systolic congestive heart failure previously. She's had an EF of 25-30% in the past but based on the latest stress test her EF is about 70%. She's not on any diuretics. Continue with her ARB.  #6 Mild dehydration: Gentle IV hydration will be provided.   DVT Prophylaxis: SCDs for now. Definitive prophylaxis to be determined by orthopedics after surgery. Code Status: Full code Family Communication: Discussed with the patient and her husband  Disposition Plan: Admit to telemetry   Further management decisions will depend on results of further testing and patient's response to treatment.   [facility]  [facility] Pager [phone]  If 7PM-7AM, please contact night-coverage [email or web address] Password [facility]  09/29/2014, 4:34 PM

## 2014-09-29 NOTE — Progress Notes (Signed)
  CARE MANAGEMENT ED NOTE 09/29/2014  Patient:  [name], [name]   Account Number:  [id number]  Date Initiated:  09/29/2014  Documentation initiated by:  Edd Arbour  Subjective/Objective Assessment:   81 yr old [place]are[place] living in independent living section of [place] with her husband in an apartment fell off of a step stool today s/p knee TKA (Dr Lajoyce Corners) dx left femur fx to be seen by Dr Ophelia Charter for surgery     Subjective/Objective Assessment Detail:   pcp [facility]  Pt and husband voiced preference is to remain in their independent living apartment together with home health services at d/c vs going to a lower level of care  Encouraged by CM to discuss with attending MD, surgery and unit therapy (OT/PT)  Provided with a list of home health agency choices for [facility] Pending further assessment after surgery  Husband stated he believes [place] [facility] "I have seen the [facility] for other people in our area" but wants to consult [place] before a choice of Home health agency is made     Action/Plan:   CM consulted by ED SW to speak with family about possible home health after d/c   Action/Plan Detail:   Anticipated DC Date:  10/02/2014     Status Recommendation to Physician:   Result of Recommendation:    Other ED Services  Consult Working Plan   In-house referral  Clinical Social Worker   DC Associate Professor  Other  Outpatient Services - Pt will follow up   [facility] Choice  HOME HEALTH   Choice offered to / List presented to:  C-1 Patient          Status of service:  Completed, signed off  ED Comments:   ED Comments Detail:

## 2014-09-29 NOTE — Progress Notes (Signed)
Clinical Social Work Department BRIEF PSYCHOSOCIAL ASSESSMENT 09/29/2014  Patient:  [name], [name]     Account Number:  [id number]     Admit date:  09/29/2014  Clinical Social Worker:  Tilda Burrow, CLINICAL SOCIAL WORKER  Date/Time:  09/29/2014 10:57 AM  Referred by:  CSW  Date Referred:  09/29/2014 Referred for  SNF Placement   Other Referral:   Interview type:  Patient Other interview type:   Family    PSYCHOSOCIAL DATA Living Status:  FACILITY Admitted from facility:  Other Level of care:  Independent Living Primary support name:  Renn Stille Primary support relationship to patient:  SPOUSE Degree of support available:   Patient currently lives at [place] with her husband in their independent living Unit. Husband is the patients primary support.    CURRENT CONCERNS Current Concerns  None Noted   Other Concerns:    SOCIAL WORK ASSESSMENT / PLAN CSW met with patient at bedside. Husband was present. Patient states that she fell off of a step stool today. Patient informed CSW that she lives at [place] in their [place] living unit with her husband. Patient appeared to be in pain during the interview. Patient says that she was able to complete her ADL's independently prior to coming into the ED today.    Patient has been diagnosed with left femur fracture. According to notes, patient had a knee replacement surgery in August 2015. Nurse CM was also consulted for patient. It has yet to be determined whether the patient will need a SNF at this time for rehab. CSW started [facility] for patient.    Husband/Dave Hasan [phone]    .Willette Brace [phone] ED CSW 09/29/2014 4:32 PM   Assessment/plan status:  Other - See comment Other assessment/ plan:   Information/referral to community resources:    PATIENT'S/FAMILY'S RESPONSE TO PLAN OF CARE: According to nurse, husband is hopeful that patient will be able to return to Independent living upon discharge.  Husband appears to be a great support.

## 2014-09-29 NOTE — Interval H&P Note (Signed)
History and Physical Interval Note:  09/29/2014 5:57 PM  [name]  has presented today for surgery, with the diagnosis of Left Femur Fracture  The various methods of treatment have been discussed with the patient and family. After consideration of risks, benefits and other options for treatment, the patient has consented to  Procedure(s): Affixus Trochanteric Femoral Nail (Left) as a surgical intervention .  The patient's history has been reviewed, patient examined, no change in status, stable for surgery.  I have reviewed the patient's chart and labs.  Questions were answered to the patient's satisfaction.     Macall Mccroskey C

## 2014-09-29 NOTE — ED Notes (Signed)
Left leg in criss cross position. Per pt this is position she landed from step stool ladder. Moderate pedal pulses bilaterally.

## 2014-09-29 NOTE — ED Provider Notes (Signed)
CSN: [id number]     Arrival date & time 09/29/14  1338 History   First MD Initiated Contact with Patient 09/29/14 1419     Chief Complaint  Patient presents with  . Fall     (Consider location/radiation/quality/duration/timing/severity/associated sxs/prior Treatment) HPI Comments: Patient is a 81 year old female with history of congestive heart failure, mitral valve replacement, left total knee replacement. She presents today after a fall. She was standing on a stepladder when she lost her balance and fell, injuring her left leg. She is complaining of severe pain in the left femur. She denies having struck her head or any neck pain. She denies any chest pain or difficulty breathing. She was unable to stand and walk, so EMS was called and the patient was brought here for evaluation.  Patient is a 81 y.o. female presenting with fall. The history is provided by the patient.  Fall This is a new problem. The current episode started less than 1 hour ago. The problem occurs constantly. The problem has not changed since onset.Pertinent negatives include no chest pain, no abdominal pain, no headaches and no shortness of breath. Nothing aggravates the symptoms. Nothing relieves the symptoms. She has tried nothing for the symptoms. The treatment provided no relief.    Past Medical History  Diagnosis Date  . Ischemic cardiomyopathy     severe. Left ventricular ejection fraction 20%.   . Chronic systolic heart failure     NYHA class II.  [name] Chronic pulmonary disease   . BBB (bundle branch block)     s/p BiV ICD implant  . Raynaud's syndrome   . Neuromuscular scoliosis of thoracolumbar region     type of scoliosis was not specified.   [name] DJD (degenerative joint disease), cervical   . DJD (degenerative joint disease), lumbar   . HTN (hypertension)   . Hyperthyroidism     following Graves disease  . Renal artery stenosis     Treated with angioplast in 1980 and 1987.   . S/P CABG (coronary artery  bypass graft) April 2012  . FH: mitral valve repair     with 26 mm Edwards ring angioplasty,   . COPD (chronic obstructive pulmonary disease)   . Full dentures   . PONV (postoperative nausea and vomiting)   . CHF (congestive heart failure)   . Dizziness   . Automatic implantable cardioverter-defibrillator in situ   . Pacemaker   . Asthma   . Pneumonia     hx  . GERD (gastroesophageal reflux disease)   . Anemia     takes iron 3 days per week  . Hx of cardiovascular stress test     Lexiscan Myoview (9/15):  Normal stress nuclear study.  LV Ejection Fraction: 76%   Past Surgical History  Procedure Laterality Date  . Total abdominal hysterectomy    . Sympathectomy    . Tonsillectomy    . Renal artery ballon dilation    . Rotator cuff repair      right and left  . Laminotomy/foraminotomy      with decompression of the L4 nerve root   . Cervical fusion      C5-6 and C6-7, C4-5 with titanium plates  . Umbilical hernia repair    . Wedge resection  2001    for the right upper lobe for Aspergillus treatement.  Dr. Edwyna Shell apprix 2001.  [name] Ptca      of bilateral renal arteries  . Cataract extraction    . Carpal tunnel  release    . Appendectomy    . Breast lumpectomy      left breast  . Renal artery ballon dilation      x2  . Precancerous growth      tops of ear removed. bilateral.   . Implantation of icd  2010    BiV ICD implant (SJM) by Dr Amil Amen 03/2009  . Repair extensor tendon  07/10/2012    Procedure: REPAIR EXTENSOR TENDON;  Surgeon: Nicki Reaper, MD;  Location: [facility];  Service: Orthopedics;  Laterality: Right;  METACARPAL PHALANGEAL REPLACEMENT ARTHROPLASTIES RIGHT INDEX, MIDDLE, AND RING FINGERS    . Finger arthroplasty  07/10/2012    Procedure: FINGER ARTHROPLASTY;  Surgeon: Nicki Reaper, MD;  Location: [facility];  Service: Orthopedics;  Laterality: Right;  METACARPAL PHALANGEAL ARTHROPLASTIES RIGHT INDEX, MIDDLE, AND RING FINGERS    . Esophageal manometry N/A 04/07/2013    Procedure: ESOPHAGEAL MANOMETRY (EM);  Surgeon: Charolett Bumpers, MD;  Location: [facility];  Service: Endoscopy;  Laterality: N/A;  . Carpometacarpel suspension plasty Right 11/12/2013    Procedure: SUSPENSION PLASTY RIGHT THUMB, TRAPEZIUM EXCISION;  Surgeon: Nicki Reaper, MD;  Location: [facility];  Service: Orthopedics;  Laterality: Right;  . Tendon transfer Right 11/12/2013    Procedure: RIGHT ABDUCTOR POLLICUS LONGUS TENDON TRANSFER;  Surgeon: Nicki Reaper, MD;  Location: [facility];  Service: Orthopedics;  Laterality: Right;  . Tonsillectomy    . Hemorrhoidectomy with hemorrhoid banding    . Facial cosmetic surgery    . Back surgery    . Eye surgery Bilateral     cataracts  . Hernia repair      umbilical  . Lung mass removal Right   . Left knee arthroscopic    . Coronary artery bypass graft  2010    mvr/cabg  . Total knee arthroplasty Left 04/29/2014    Procedure: LEFT TOTAL KNEE ARTHROPLASTY;  Surgeon: Nadara Mustard, MD;  Location: [facility];  Service: Orthopedics;  Laterality: Left;   Family History  Problem Relation Age of Onset  . Heart disease Father   . Hypertension Father   . Colon cancer      grandmother   History  Substance Use Topics  . Smoking status: Never Smoker   . Smokeless tobacco: Not on file     Comment: passive smoker from birth to age 49 (mom and husband)  . Alcohol Use: No   OB History    No data available     Review of Systems  Respiratory: Negative for shortness of breath.   Cardiovascular: Negative for chest pain.  Gastrointestinal: Negative for abdominal pain.  Neurological: Negative for headaches.  All other systems reviewed and are negative.     Allergies  Alprazolam; Doxycycline; Fulvicin p-g; Hydrocodone; Ketoconazole; Serevent; Calcitonin (salmon); Morphine and related; Amitriptyline; Cefuroxime axetil; Hydrocodone-acetaminophen; Other; Sulfamethoxazole-trimethoprim;  Sulfonamide derivatives; Trovan; Aspirin; Cephalexin; Ciprofloxacin; Clonazepam; Erythromycin; Hydromorphone; Oxycodone-aspirin; Penicillins; and Tapazole  Home Medications   Prior to Admission medications   Medication Sig Start Date End Date Taking? Authorizing Provider  acetaminophen (TYLENOL) 500 MG tablet Take 500 mg by mouth every 6 (six) hours as needed for mild pain or moderate pain.   Yes Historical Provider, MD  carvedilol (COREG) 12.5 MG tablet Take 1 tablet (12.5 mg total) by mouth 2 (two) times daily. 04/01/14  Yes Donato Schultz, MD  Cholecalciferol (VITAMIN D3) 2000 UNITS capsule Take 2,000 Units by mouth daily.  Yes Historical Provider, MD  Coenzyme Q10 (COQ10) 100 MG CAPS Take 100 mg by mouth daily.    Yes Historical Provider, MD  ferrous sulfate 325 (65 FE) MG tablet Take 1 tablet ONLY on Monday, Wednesday, and Friday   Yes Historical Provider, MD  fluconazole (DIFLUCAN) 150 MG tablet Take 150 mg by mouth once a week. Sunday 03/04/14  Yes Historical Provider, MD  levothyroxine (SYNTHROID, LEVOTHROID) 75 MCG tablet Take 75 mcg by mouth daily before breakfast.   Yes Historical Provider, MD  montelukast (SINGULAIR) 10 MG tablet Take 10 mg by mouth daily.    Yes Historical Provider, MD  pantoprazole (PROTONIX) 40 MG tablet Take 40 mg by mouth 2 (two) times daily.   Yes Historical Provider, MD  valsartan (DIOVAN) 80 MG tablet Take 40 mg by mouth daily.   Yes Historical Provider, MD  acetaminophen-codeine (TYLENOL #3) 300-30 MG per tablet Take 1-2 tablets by mouth every 4 (four) hours as needed for moderate pain. Patient not taking: Reported on 09/29/2014 05/01/14   Nadara Mustard, MD  aspirin EC 81 MG tablet Take 1 tablet (81 mg total) by mouth daily. Patient not taking: Reported on 09/29/2014 06/05/14   Beatrice Lecher, PA-C   BP 176/75 mmHg  Pulse 61  Temp(Src) 97.8 F (36.6 C) (Oral)  Resp 18  SpO2 98% Physical Exam  Constitutional: She is oriented to person, place, and time. She  appears well-developed and well-nourished. No distress.  HENT:  Head: Normocephalic and atraumatic.  Neck: Normal range of motion. Neck supple.  Cardiovascular: Normal rate and regular rhythm.  Exam reveals no gallop and no friction rub.   No murmur heard. Pulmonary/Chest: Effort normal and breath sounds normal. No respiratory distress. She has no wheezes.  Abdominal: Soft. Bowel sounds are normal. She exhibits no distension. There is no tenderness.  Musculoskeletal: Normal range of motion.  There is exquisite tenderness to palpation over the mid femur. Distal pulses are intact as are motor and sensory.  Neurological: She is alert and oriented to person, place, and time.  Skin: Skin is warm and dry. She is not diaphoretic.  Nursing note and vitals reviewed.   ED Course  Procedures (including critical care time) Labs Review Labs Reviewed  BASIC METABOLIC PANEL  CBC WITH DIFFERENTIAL  PROTIME-INR    Imaging Review No results found.   EKG Interpretation   Date/Time:  Tuesday September 29 2014 14:45:01 EST Ventricular Rate:  67 PR Interval:  181 QRS Duration: 118 QT Interval:  487 QTC Calculation: 514 R Axis:   -104 Text Interpretation:  A-V dual-paced rhythm with some inhibition No  further analysis attempted due to paced rhythm Confirmed by DELOS  MD,  Passion Lavin ([id number]) on 09/29/2014 3:24:57 PM      MDM   Final diagnoses:  Fall    Patient presents here after a fall. She is having severe pain in her left femur. X-rays confirm an oblique fracture of the proximal femur. She is neurovascularly intact distally. She was treated with pain medication however is not having much relief. I've spoken with Dr. Ophelia Charter from orthopedic surgery who is requesting the patient be admitted to the hospitalist service for medical clearance. I've spoken with Dr. Rito Ehrlich on who agrees to admit.    Geoffery Lyons, MD 09/29/14 [phone]

## 2014-09-29 NOTE — Brief Op Note (Signed)
09/29/2014  7:19 PM  PATIENT:  [name]  82 y.o. female  PRE-OPERATIVE DIAGNOSIS:  Left Femur Shaft  Fracture  POST-OPERATIVE DIAGNOSIS:  Left Femur Shaft  Fracture  PROCEDURE:  Procedure(s): Affixus Trochanteric Femoral Nail (Left)  SURGEON:  Surgeon(s) and Role:    * Eldred Manges, MD - Primary  PHYSICIAN ASSISTANT:   ASSISTANTS: none   ANESTHESIA:   local and general  EBL:     BLOOD ADMINISTERED:none  DRAINS: none   LOCAL MEDICATIONS USED:  MARCAINE     SPECIMEN:  No Specimen  DISPOSITION OF SPECIMEN:  N/A  COUNTS:  YES  TOURNIQUET:  * No tourniquets in log *  DICTATION: .Other Dictation: Dictation Number [id number]  PLAN OF CARE: already inpt  PATIENT DISPOSITION:  PACU - hemodynamically stable.   Delay start of Pharmacological VTE agent (>24hrs) due to surgical blood loss or risk of bleeding: yes

## 2014-09-30 ENCOUNTER — Inpatient Hospital Stay (HOSPITAL_COMMUNITY): Payer: Medicare Other

## 2014-09-30 ENCOUNTER — Encounter (HOSPITAL_COMMUNITY): Payer: Self-pay | Admitting: Orthopaedic Surgery

## 2014-09-30 DIAGNOSIS — S7292XD Unspecified fracture of left femur, subsequent encounter for closed fracture with routine healing: Secondary | ICD-10-CM

## 2014-09-30 LAB — BASIC METABOLIC PANEL
Anion gap: 7 (ref 5–15)
Anion gap: 8 (ref 5–15)
BUN: 32 mg/dL — ABNORMAL HIGH (ref 6–23)
BUN: 40 mg/dL — ABNORMAL HIGH (ref 6–23)
CALCIUM: 8.6 mg/dL (ref 8.4–10.5)
CHLORIDE: 104 meq/L (ref 96–112)
CO2: 25 mmol/L (ref 19–32)
CO2: 17 mmol/L — AB (ref 19–32)
CREATININE: 1.43 mg/dL — AB (ref 0.50–1.10)
Calcium: 8.2 mg/dL — ABNORMAL LOW (ref 8.4–10.5)
Chloride: 102 mEq/L (ref 96–112)
Creatinine, Ser: 1.1 mg/dL (ref 0.50–1.10)
GFR calc Af Amer: 39 mL/min — ABNORMAL LOW (ref >90)
GFR calc non Af Amer: 46 mL/min — ABNORMAL LOW (ref >90)
GFR calc non Af Amer: 34 mL/min — ABNORMAL LOW (ref >90)
GFR, EST AFRICAN AMERICAN: 54 mL/min — AB (ref >90)
GLUCOSE: 144 mg/dL — AB (ref 70–99)
Glucose, Bld: 153 mg/dL — ABNORMAL HIGH (ref 70–99)
Potassium: 4.5 mmol/L (ref 3.5–5.1)
Potassium: 4.4 mmol/L (ref 3.5–5.1)
SODIUM: 129 mmol/L — AB (ref 135–145)
Sodium: 134 mmol/L — ABNORMAL LOW (ref 135–145)

## 2014-09-30 LAB — CBC
HCT: 28 % — ABNORMAL LOW (ref 36.0–46.0)
HCT: 22.2 % — ABNORMAL LOW (ref 36.0–46.0)
HEMOGLOBIN: 7.2 g/dL — AB (ref 12.0–15.0)
Hemoglobin: 9.1 g/dL — ABNORMAL LOW (ref 12.0–15.0)
MCH: 28.9 pg (ref 26.0–34.0)
MCH: 28.7 pg (ref 26.0–34.0)
MCHC: 32.5 g/dL (ref 30.0–36.0)
MCHC: 32.4 g/dL (ref 30.0–36.0)
MCV: 88.9 fL (ref 78.0–100.0)
MCV: 88.4 fL (ref 78.0–100.0)
Platelets: 123 10*3/uL — ABNORMAL LOW (ref 150–400)
Platelets: 111 10*3/uL — ABNORMAL LOW (ref 150–400)
RBC: 3.15 MIL/uL — AB (ref 3.87–5.11)
RBC: 2.51 MIL/uL — AB (ref 3.87–5.11)
RDW: 15.8 % — ABNORMAL HIGH (ref 11.5–15.5)
RDW: 16 % — ABNORMAL HIGH (ref 11.5–15.5)
WBC: 9.9 10*3/uL (ref 4.0–10.5)
WBC: 9.4 10*3/uL (ref 4.0–10.5)

## 2014-09-30 LAB — BLOOD GAS, ARTERIAL
Acid-base deficit: 3.7 mmol/L — ABNORMAL HIGH (ref 0.0–2.0)
Bicarbonate: 19.1 mEq/L — ABNORMAL LOW (ref 20.0–24.0)
Drawn by: 276051
O2 Content: 3 L/min
PATIENT TEMPERATURE: 37
PCO2 ART: 26.3 mmHg — AB (ref 35.0–45.0)
PO2 ART: 174 mmHg — AB (ref 80.0–100.0)
TCO2: 19.9 mmol/L (ref 0–100)
pH, Arterial: 7.475 — ABNORMAL HIGH (ref 7.350–7.450)

## 2014-09-30 LAB — PREPARE RBC (CROSSMATCH)

## 2014-09-30 LAB — ABO/RH: ABO/RH(D): A POS

## 2014-09-30 LAB — AMMONIA

## 2014-09-30 MED ORDER — SODIUM CHLORIDE 0.9 % IV BOLUS (SEPSIS)
500.0000 mL | Freq: Once | INTRAVENOUS | Status: AC
  Administered 2014-09-30: 500 mL via INTRAVENOUS

## 2014-09-30 MED ORDER — SODIUM CHLORIDE 0.9 % IV SOLN
INTRAVENOUS | Status: DC
  Administered 2014-09-30 – 2014-10-01 (×3): via INTRAVENOUS

## 2014-09-30 MED ORDER — SODIUM CHLORIDE 0.9 % IV SOLN: Freq: Once | INTRAVENOUS | Status: DC

## 2014-09-30 MED ORDER — BOOST / RESOURCE BREEZE PO LIQD
1.0000 | Freq: Three times a day (TID) | ORAL | Status: DC
Start: 2014-09-30 — End: 2014-10-03
  Administered 2014-09-30 – 2014-10-03 (×7): 1 via ORAL

## 2014-09-30 NOTE — Progress Notes (Signed)
Clinical Social Work  CSW met with patient at bedside to discuss DC plans. CSW introduced myself and explained role. Patient confirms that she and husband have been living at [facility] independent living for about 3 years. Patient reports that she has worked with PT and realizes that they are recommending SNF at [facility]. Patient was hopeful that she could return home with husband and have [facility] services like she has done in the past but reports she understands this is a different injury. Patient reports she has not heard good things about SNF at [facility] and is not sure if she wants to go there. CSW explained that patient's insurance would cover placement at another facility if desired and provided SNF list. CSW explained SNF process. Patient reports she wants to discuss plans with husband today and asked CSW to follow up tomorrow.  CSW left contact information for patient if further questions arise and will follow up tomorrow.  CSW completed FL[facility] and [facility] reports they will have available bed at their facility if patient desires rehab at DC.  [place], [place] [phone]

## 2014-09-30 NOTE — Progress Notes (Signed)
Subjective: Doing well.  Pain controlled.    Objective: Vital signs in last 24 hours: Temp:  [97.3 F (36.3 C)-98.4 F (36.9 C)] 98.4 F (36.9 C) (01/06 0622) Pulse Rate:  [60-77] 77 (01/06 0622) Resp:  [16-22] 16 (01/06 0622) BP: (108-227)/(42-84) 118/42 mmHg (01/06 0622) SpO2:  [95 %-100 %] 98 % (01/06 0622)  Intake/Output from previous day: 01/05 0701 - 01/06 0700 In: 1115 [I.V.:1115] Out: 475 [Urine:275; Blood:200] Intake/Output this shift: Total I/O In: 100 [P.O.:100] Out: 150 [Urine:150]   Recent Labs  09/29/14 1454 09/30/14 0440  HGB 11.7* 9.1*    Recent Labs  09/29/14 1454 09/30/14 0440  WBC 11.5* 9.9  RBC 4.08 3.15*  HCT 36.5 28.0*  PLT 184 123*    Recent Labs  09/29/14 1454 09/30/14 0440  NA 137 134*  K 4.9 4.5  CL 105 102  CO2 24 25  BUN 28* 32*  CREATININE 1.12* 1.10  GLUCOSE 163* 144*  CALCIUM 9.4 8.6    Recent Labs  09/29/14 1454  INR 1.04    Exam:  Wounds look good.  Staples intact.  No drainage or signs of infection.  Calf nontender.  NVI.   Assessment/Plan: Start PT.  WBAT.  Question return back to home vs short SNF placement.     Hue Frick M 09/30/2014, 12:23 PM

## 2014-09-30 NOTE — Evaluation (Addendum)
Physical Therapy Evaluation Patient Details Name: [name] MRN: [id number] DOB: 1933-06-20 Today's Date: 09/30/2014   History of Present Illness  81 yo female adm after fall, now s/p IM nail L femur; PMHx: HTN, dizziness, COPD, Ischemic cardiomyopathy, Chronic systolic heart failure , S/P CABG, with Automatic implantable cardioverter-defibrillator in situ.   Clinical Impression  Pt admitted with above diagnosis. Pt currently with functional limitations due to the deficits listed below (see PT Problem List).  Pt will benefit from skilled PT to increase their independence and safety with mobility to allow discharge to the venue listed below.  Pt needs STSNF, discussed with SW     Follow Up Recommendations SNF;Supervision/Assistance - 24 hour    Equipment Recommendations       Recommendations for Other Services       Precautions / Restrictions Precautions Precautions: Fall Restrictions Other Position/Activity Restrictions: WBAT      Mobility  Bed Mobility Overal bed mobility: Needs Assistance Bed Mobility: Supine to Sit;Sit to Supine     Supine to sit: Max assist Sit to supine: Total assist   General bed mobility comments: pt requiring assist with trunk, LEs, bed pad utilized to assist pt; dependent to scoot up in bed; pt c/o dizziness in sitting position and felt she was unable to attempt further mobility  Transfers                    Ambulation/Gait                Stairs            Wheelchair Mobility    Modified Rankin (Stroke Patients Only)       Balance Overall balance assessment: History of Falls;Needs assistance Sitting-balance support: Single extremity supported;Bilateral upper extremity supported Sitting balance-Leahy Scale: Poor Sitting balance - Comments: poor to zero, pt requiring min to max assist to remain in sitting position, repeatedly attempts to lie on her R side       Standing balance comment: NT with +1 assist                              Pertinent Vitals/Pain Pain Assessment: 0-10 Pain Score: 10-Worst pain ever Pain Location: L hip and back Pain Intervention(s): Limited activity within patient's tolerance;Monitored during session;Ice applied;Premedicated before session  Pt dizzy with EOB, BP 128/46 HR 66 sats 100% on RA    Home Living Family/patient expects to be discharged to:: Private residence Living Arrangements: Spouse/significant other Available Help at Discharge: Family;Available 24 hours/day Type of Home: House Home Access: Level entry     Home Layout: One level Home Equipment: Crutches;Cane - single point Additional Comments: walk in shower with seat    Prior Function Level of Independence: Independent         Comments: pt and husband areside at [facility] living     Hand Dominance   Dominant Hand: Right    Extremity/Trunk Assessment   Upper Extremity Assessment: Defer to OT evaluation           Lower Extremity Assessment: Generalized weakness;LLE deficits/detail   LLE Deficits / Details: pt able to df ankle to neutral with encouragement, too painful for further testing     Communication   Communication: No difficulties  Cognition Arousal/Alertness: Awake/alert Behavior During Therapy: WFL for tasks assessed/performed Overall Cognitive Status: Within Functional Limits for tasks assessed  General Comments      Exercises        Assessment/Plan    PT Assessment Patient needs continued PT services  PT Diagnosis Difficulty walking;Generalized weakness;Acute pain   PT Problem List Decreased strength;Decreased range of motion;Decreased activity tolerance;Decreased balance;Decreased mobility;Pain;Decreased knowledge of use of DME  PT Treatment Interventions DME instruction;Gait training;Functional mobility training;Therapeutic activities;Therapeutic exercise;Patient/family education;Balance training   PT Goals  (Current goals can be found in the Care Plan section) Acute Rehab PT Goals Patient Stated Goal: to go home PT Goal Formulation: With patient/family Time For Goal Achievement: 10/07/14 Potential to Achieve Goals: Good    Frequency Min 3X/week   Barriers to discharge        Co-evaluation               End of Session   Activity Tolerance: Patient limited by fatigue;Patient limited by pain Patient left: in bed;with call bell/phone within reach;with bed alarm set Nurse Communication: Mobility status         Time: 1057-1130 PT Time Calculation (min) (ACUTE ONLY): 33 min   Charges:   PT Evaluation $Initial PT Evaluation Tier I: 1 Procedure PT Treatments $Therapeutic Activity: 23-37 mins   PT G Codes:        Jalil Lorusso 10/09/2014, 1:01 PM

## 2014-09-30 NOTE — Progress Notes (Signed)
UR complete 

## 2014-09-30 NOTE — Progress Notes (Signed)
OT Cancellation Note  Patient Details Name: [name] MRN: [id number] DOB: 05/14/1933   Cancelled Treatment:    Reason Eval/Treat Not Completed: Other (comment) Spoke with PT and pt not ready for OT today. She didn't tolerate sitting EOB per PT.  Lennox Laity  [phone] 09/30/2014, 12:03 PM

## 2014-09-30 NOTE — Progress Notes (Signed)
CARE MANAGEMENT NOTE 09/30/2014  Patient:  [name], [name]   Account Number:  [id number]  Date Initiated:  09/30/2014  Documentation initiated by:  Ferdinand Cava  Subjective/Objective Assessment:   81 yo female admitted with Left Femure Fracture from IL at [facility]     Action/Plan:   discharge planning   Anticipated DC Date:  10/03/2014   Anticipated DC Plan:    In-house referral  Clinical Social Worker      DC Planning Services  CM consult      Choice offered to / List presented to:             Status of service:  In process, will continue to follow Medicare Important Message given?   (If response is "NO", the following Medicare IM given date fields will be blank) Date Medicare IM given:   Medicare IM given by:   Date Additional Medicare IM given:   Additional Medicare IM given by:    Discharge Disposition:    Per UR Regulation:  Reviewed for med. necessity/level of care/duration of stay  If discussed at Long Length of Stay Meetings, dates discussed:    Comments:  09/30/14 Ferdinand Cava RN [facility] CM [phone] Patient stated that she lives in an independent apartment at [place] with her spouse. She has a walker and a cane but does not use, her and her husband drive to appointments. Her PCP is Dr. Kevan Ny. She stated that she prefers [facility] services for rehab either with the [facility] rehab group or an outside agency but will consider a rehab facility if recommended. Will continue to follow

## 2014-09-30 NOTE — Progress Notes (Signed)
[facility] PROGRESS NOTE  Emmalia I Fung MBB:[id number] DOB: [birth year] DOA: 09/29/2014 PCP: Pearla Dubonnet, MD  Assessment/Plan: 1. Left hip pain: From left femoral fracture. Underwent surgery on 1/5. Pain control and physical therapy evaluation.   Anemia of blood loss: Probably from surgery. Continue to monitor.   Lethargy: probably from a combination of pain meds and phenergan.  Get vital signs, repeat labs and get ABG. Continue to monitor.    Code Status: full code Family Communication: none at bedside Disposition Plan: pening   Consultants:  Orthopedics.  Procedures: Left AFFIXUS trochanteric nail proximal and distal interlock fixation of left femoral shaft fracture. Antibiotics:  none  HPI/Subjective: Pain better controlled.   Objective: Filed Vitals:   09/30/14 1455  BP: 128/66  Pulse: 78  Temp: 98.6 F (37 C)  Resp: 16    Intake/Output Summary (Last 24 hours) at 09/30/14 1809 Last data filed at 09/30/14 0900  Gross per 24 hour  Intake   1215 ml  Output    625 ml  Net    590 ml   There were no vitals filed for this visit.  Exam:   General:  Alert afebrile comfortable  Cardiovascular: s1s2  Respiratory: ctab  Abdomen: soft non tender non distended bowel sounds heard  Musculoskeletal:  No pedal edema.   Data Reviewed: Basic Metabolic Panel:  Recent Labs Lab 09/29/14 1454 09/30/14 0440  NA 137 134*  K 4.9 4.5  CL 105 102  CO2 24 25  GLUCOSE 163* 144*  BUN 28* 32*  CREATININE 1.12* 1.10  CALCIUM 9.4 8.6   Liver Function Tests: No results for input(s): AST, ALT, ALKPHOS, BILITOT, PROT, ALBUMIN in the last 168 hours. No results for input(s): LIPASE, AMYLASE in the last 168 hours. No results for input(s): AMMONIA in the last 168 hours. CBC:  Recent Labs Lab 09/29/14 1454 09/30/14 0440  WBC 11.5* 9.9  NEUTROABS 9.6*  --   HGB 11.7* 9.1*  HCT 36.5 28.0*  MCV 89.5 88.9  PLT 184 123*   Cardiac Enzymes: No results  for input(s): CKTOTAL, CKMB, CKMBINDEX, TROPONINI in the last 168 hours. BNP (last 3 results)  Recent Labs  05/14/14 1433 06/03/14 1627  PROBNP 1346.0* 70.0   CBG: No results for input(s): GLUCAP in the last 168 hours.  Recent Results (from the past 240 hour(s))  Surgical pcr screen     Status: Abnormal   Collection Time: 09/29/14  5:33 PM  Result Value Ref Range Status   MRSA, PCR POSITIVE (A) NEGATIVE Final    Comment: RESULT CALLED TO, READ BACK BY AND VERIFIED WITH: SPOKE WITH YOUNG,S RN [phone] [id number] COVINGTON,N    Staphylococcus aureus POSITIVE (A) NEGATIVE Final    Comment:        The Xpert SA Assay (FDA approved for NASAL specimens in patients over 79 years of age), is one component of a comprehensive surveillance program.  Test performance has been validated by [employer] for patients greater than or equal to 53 year old. It is not intended to diagnose infection nor to guide or monitor treatment.      Studies: Dg Chest 1 View  09/29/2014   CLINICAL DATA:  Recent fall  EXAM: CHEST - 1 VIEW  COMPARISON:  05/14/1949  FINDINGS: Cardiac shadow is stable. A defibrillator is again noted. Postsurgical changes consistent with valvular repair are again seen. Cervical spine surgical changes are again noted. The lungs are well aerated with mild interstitial changes stable from the prior exam.  Hyperexpansion is again identified. No focal infiltrate or sizable effusion is seen.  IMPRESSION: COPD without acute abnormality.   Electronically Signed   By: Alcide Clever M.D.   On: 09/29/2014 15:35   Dg C-arm 1-60 Min-no Report  09/29/2014   CLINICAL DATA:  Left femur fracture.  EXAM: DG C-ARM 1-60 MIN - NRPT [facility]; DG FEMUR 2+V*L*  COMPARISON:  Radiographs 09/29/2014  FINDINGS: Fluoroscopic spot images demonstrate placement of a intra medullary rod in the left femur transfixing the long subtrochanteric fracture. There is a proximal dynamic hip screw and 2 distal interlocking screws.   IMPRESSION: Internal fixation of left femoral shaft fracture with an intramedullary rod.   Electronically Signed   By: Loralie Champagne M.D.   On: 09/29/2014 20:14   Dg Femur Min 2 Views Left  09/29/2014   CLINICAL DATA:  Left femur fracture.  EXAM: DG C-ARM 1-60 MIN - NRPT [facility]; DG FEMUR 2+V*L*  COMPARISON:  Radiographs 09/29/2014  FINDINGS: Fluoroscopic spot images demonstrate placement of a intra medullary rod in the left femur transfixing the long subtrochanteric fracture. There is a proximal dynamic hip screw and 2 distal interlocking screws.  IMPRESSION: Internal fixation of left femoral shaft fracture with an intramedullary rod.   Electronically Signed   By: Loralie Champagne M.D.   On: 09/29/2014 20:14   Dg Femur Min 2 Views Left  09/29/2014   CLINICAL DATA:  Recent fall with leg pain  EXAM: DG FEMUR 2+V*L*  COMPARISON:  None.  FINDINGS: There is an oblique fracture through the proximal diaphysis of the left femur. Mild displacement at the fracture site is noted and as well as rotation of the distal fracture fragment. No other fractures are noted. A left knee replacement is seen.  IMPRESSION: Midshaft oblique fracture of the left femur with mild overlap and angulation of the distal fracture fragment with respect to the proximal fracture fragment.   Electronically Signed   By: Alcide Clever M.D.   On: 09/29/2014 15:38    Scheduled Meds: . carvedilol  12.5 mg Oral BID WC  . Chlorhexidine Gluconate Cloth  6 each Topical Q0600  . docusate sodium  100 mg Oral BID  . feeding supplement (RESOURCE BREEZE)  1 Container Oral TID BM  . irbesartan  75 mg Oral Daily  . levothyroxine  75 mcg Oral QAC breakfast  . mupirocin ointment  1 application Nasal BID  . pantoprazole  40 mg Oral BID   Continuous Infusions: . sodium chloride 75 mL/hr at 09/29/14 2208    Principal Problem:   Femur fracture, left Active Problems:   Automatic implantable cardioverter-defibrillator in situ   S/P CABG (coronary artery  bypass graft)   Coronary atherosclerosis of native coronary artery   S/P mitral valve repair   Hypothyroidism    Time spent: 15 min    Anastasios Melander  [facility] Pager [phone]. If 7PM-7AM, please contact night-coverage at [email or web address], password [facility] 09/30/2014, 6:09 PM  LOS: 1 day

## 2014-09-30 NOTE — Progress Notes (Addendum)
Clinical Social Work Department CLINICAL SOCIAL WORK PLACEMENT NOTE 09/30/2014  Patient:  SHRINA, WORM  Account Number:  [id number] Admit date:  09/29/2014  Clinical Social Worker:  Unk Lightning, LCSW  Date/time:  09/30/2014 02:30 PM  Clinical Social Work is seeking post-discharge placement for this patient at the following level of care:   SKILLED NURSING   (*CSW will update this form in Epic as items are completed)   09/30/2014  Patient/family provided with [facility] Department of Clinical Social Work's list of facilities offering this level of care within the geographic area requested by the patient (or if unable, by the patient's family).  09/30/2014  Patient/family informed of their freedom to choose among providers that offer the needed level of care, that participate in Medicare, Medicaid or managed care program needed by the patient, have an available bed and are willing to accept the patient.  09/30/2014  Patient/family informed of [facility]' ownership interest in [facility], as well as of the fact that they are under no obligation to receive care at this facility.  PASARR submitted to EDS on existing # PASARR number received on   FL2 transmitted to all facilities in geographic area requested by pt/family on  10/02/14 FL2 transmitted to all facilities within larger geographic area on   Patient informed that his/her managed care company has contracts with or will negotiate with  certain facilities, including the following:     Patient/family informed of bed offers received:  10/02/14 Patient chooses bed at [facility] Physician recommends and patient chooses bed at    Patient to be transferred to  on   Patient to be transferred to facility by  Patient and family notified of transfer on  Name of family member notified:    The following physician request were entered in Epic:   Additional Comments:

## 2014-09-30 NOTE — Progress Notes (Signed)
Patient lethargic, awakes to voice, and follows commands, but falls back to sleep immediately.  Patient has not urinated since foley discontinued, and was bladder scanned to find nothing.  Dr. Blake Divine notified of findings.  Vitals stable but patient currently still very sleepy.  States that she feels "dopey".   Philomena Doheny RN

## 2014-09-30 NOTE — Progress Notes (Signed)
INITIAL NUTRITION ASSESSMENT  DOCUMENTATION CODES Per approved criteria  -Not Applicable   INTERVENTION: - Resource Breeze po TID, each supplement provides 250 kcal and 9 grams of protein - Recommend pt's weight to be updated in chart - RD will continue to monitor for nutrition care plan.  NUTRITION DIAGNOSIS: Inadequate oral intake related to hip fracture as evidenced by poor appetite.   Goal: Pt to meet >/= 90% of their estimated nutrition needs   Monitor:  Weight trend, po intake, acceptance of supplements, labs  Reason for Assessment: Consult for hip fracture  81 y.o. female  Admitting Dx: Femur fracture, left  ASSESSMENT: 81 y.o. Female fell off step stool in hall cleaning closet with left femur shaft Fx. Has left TKA by Dr. Lajoyce Corners.   - Pt has been eating poorly since admission to the hospital. She refused breakfast and lunch today. Encouraged by RD to try to eat something. She says that "everything tastes bad." Pt offered nutritional supplements and refused Ensure and Boost. Agreed reluctantly to try [employer]. Explained to pt the importance of adequate nutrition for healing after surgery.  - Pt reports a good appetite prior to admission and no recent weight loss  Nutrition Focused Physical Exam:  Subcutaneous Fat:  Orbital Region: moderate depletion Upper Arm Region: moderate depletion Thoracic and Lumbar Region: n/a  Muscle:  Temple Region: moderate depletion Clavicle Bone Region: moderate depletion Clavicle and Acromion Bone Region: moderate depletion Scapular Bone Region: n/a Dorsal Hand: moderate depletion Patellar Region: moderate depletion Anterior Thigh Region: n/a Posterior Calf Region: n/a  Edema: none  Height: Ht Readings from Last 1 Encounters:  06/22/14 5' (1.524 m)    Weight: Wt Readings from Last 1 Encounters:  06/22/14 113 lb (51.256 kg)    Ideal Body Weight: 100 lbs  % Ideal Body Weight: 113%  Wt Readings from Last 10  Encounters:  06/22/14 113 lb (51.256 kg)  06/22/14 113 lb 12.8 oz (51.619 kg)  06/17/14 108 lb (48.988 kg)  06/03/14 112 lb (50.803 kg)  04/29/14 114 lb (51.71 kg)  04/22/14 114 lb 9.6 oz (51.982 kg)  04/01/14 114 lb 12.8 oz (52.073 kg)  11/12/13 116 lb (52.617 kg)  08/28/13 116 lb (52.617 kg)  07/16/13 117 lb 3.2 oz (53.162 kg)    Usual Body Weight: 113 lbs  % Usual Body Weight: n/a  BMI:  There is no weight on file to calculate BMI.  Estimated Nutritional Needs: Kcal: 1600-1800 Protein: 90-100 g Fluid: 1.8 L/day  Skin: closed incision on left hip  Diet Order: Diet regular  EDUCATION NEEDS: -Education needs addressed   Intake/Output Summary (Last 24 hours) at 09/30/14 1520 Last data filed at 09/30/14 0900  Gross per 24 hour  Intake   1215 ml  Output    625 ml  Net    590 ml    Last BM: prior to admission   Labs:   Recent Labs Lab 09/29/14 1454 09/30/14 0440  NA 137 134*  K 4.9 4.5  CL 105 102  CO2 24 25  BUN 28* 32*  CREATININE 1.12* 1.10  CALCIUM 9.4 8.6  GLUCOSE 163* 144*    CBG (last 3)  No results for input(s): GLUCAP in the last 72 hours.  Scheduled Meds: . carvedilol  12.5 mg Oral BID WC  . Chlorhexidine Gluconate Cloth  6 each Topical Q0600  . docusate sodium  100 mg Oral BID  . irbesartan  75 mg Oral Daily  . levothyroxine  75 mcg  Oral QAC breakfast  . mupirocin ointment  1 application Nasal BID  . pantoprazole  40 mg Oral BID    Continuous Infusions: . sodium chloride 75 mL/hr at 09/29/14 2208    Past Medical History  Diagnosis Date  . Ischemic cardiomyopathy     severe. Left ventricular ejection fraction 20%.   . Chronic systolic heart failure     NYHA class II.  [name] Chronic pulmonary disease   . BBB (bundle branch block)     s/p BiV ICD implant  . Raynaud's syndrome   . Neuromuscular scoliosis of thoracolumbar region     type of scoliosis was not specified.   [name] DJD (degenerative joint disease), cervical   . DJD  (degenerative joint disease), lumbar   . HTN (hypertension)   . Hyperthyroidism     following Graves disease  . Renal artery stenosis     Treated with angioplast in 1980 and 1987.   . S/P CABG (coronary artery bypass graft) April 2012  . FH: mitral valve repair     with 26 mm Edwards ring angioplasty,   . COPD (chronic obstructive pulmonary disease)   . Full dentures   . PONV (postoperative nausea and vomiting)   . CHF (congestive heart failure)   . Dizziness   . Automatic implantable cardioverter-defibrillator in situ   . Pacemaker   . Asthma   . Pneumonia     hx  . GERD (gastroesophageal reflux disease)   . Anemia     takes iron 3 days per week  . Hx of cardiovascular stress test     Lexiscan Myoview (9/15):  Normal stress nuclear study.  LV Ejection Fraction: 76%    Past Surgical History  Procedure Laterality Date  . Total abdominal hysterectomy    . Sympathectomy    . Tonsillectomy    . Renal artery ballon dilation    . Rotator cuff repair      right and left  . Laminotomy/foraminotomy      with decompression of the L4 nerve root   . Cervical fusion      C5-6 and C6-7, C4-5 with titanium plates  . Umbilical hernia repair    . Wedge resection  2001    for the right upper lobe for Aspergillus treatement.  Dr. Edwyna Shell apprix 2001.  [name] Ptca      of bilateral renal arteries  . Cataract extraction    . Carpal tunnel release    . Appendectomy    . Breast lumpectomy      left breast  . Renal artery ballon dilation      x2  . Precancerous growth      tops of ear removed. bilateral.   . Implantation of icd  2010    BiV ICD implant (SJM) by Dr Amil Amen 03/2009  . Repair extensor tendon  07/10/2012    Procedure: REPAIR EXTENSOR TENDON;  Surgeon: Nicki Reaper, MD;  Location: [facility];  Service: Orthopedics;  Laterality: Right;  METACARPAL PHALANGEAL REPLACEMENT ARTHROPLASTIES RIGHT INDEX, MIDDLE, AND RING FINGERS    . Finger arthroplasty  07/10/2012     Procedure: FINGER ARTHROPLASTY;  Surgeon: Nicki Reaper, MD;  Location: [facility];  Service: Orthopedics;  Laterality: Right;  METACARPAL PHALANGEAL ARTHROPLASTIES RIGHT INDEX, MIDDLE, AND RING FINGERS   . Esophageal manometry N/A 04/07/2013    Procedure: ESOPHAGEAL MANOMETRY (EM);  Surgeon: Charolett Bumpers, MD;  Location: [facility];  Service: Endoscopy;  Laterality: N/A;  .  Carpometacarpel suspension plasty Right 11/12/2013    Procedure: SUSPENSION PLASTY RIGHT THUMB, TRAPEZIUM EXCISION;  Surgeon: Nicki Reaper, MD;  Location: [facility];  Service: Orthopedics;  Laterality: Right;  . Tendon transfer Right 11/12/2013    Procedure: RIGHT ABDUCTOR POLLICUS LONGUS TENDON TRANSFER;  Surgeon: Nicki Reaper, MD;  Location: [facility];  Service: Orthopedics;  Laterality: Right;  . Tonsillectomy    . Hemorrhoidectomy with hemorrhoid banding    . Facial cosmetic surgery    . Back surgery    . Eye surgery Bilateral     cataracts  . Hernia repair      umbilical  . Lung mass removal Right   . Left knee arthroscopic    . Coronary artery bypass graft  2010    mvr/cabg  . Total knee arthroplasty Left 04/29/2014    Procedure: LEFT TOTAL KNEE ARTHROPLASTY;  Surgeon: Nadara Mustard, MD;  Location: [facility];  Service: Orthopedics;  Laterality: Left;    Emmaline Kluver MS, RD, LDN

## 2014-09-30 NOTE — Op Note (Signed)
[name], [name]                 ACCOUNT NO.:  [id number]  MEDICAL RECORD NO.:  [id number]  LOCATION:                                 FACILITY:  PHYSICIAN:  Tulsi Crossett C. Ophelia Charter, M.D.    DATE OF BIRTH:  Jun 19, 1933  DATE OF PROCEDURE:  09/29/2014 DATE OF DISCHARGE:                              OPERATIVE REPORT   PREOPERATIVE DIAGNOSES:  Left femoral shaft fracture.  POSTOPERATIVE DIAGNOSIS:  Left femoral shaft fracture.  PROCEDURE:  Left AFFIXUS trochanteric nail proximal and distal interlock fixation of left femoral shaft fracture.  SURGEON:  Kasheem Toner C. Ophelia Charter, MD.  ANESTHESIA:  General.  ESTIMATED BLOOD LOSS:  Less than 200 mL.  DRAINS:  None.  COMPLICATIONS:  None.  IMPLANTS:  AFFIXUS Biomet 320 nail 90 mm lag screw.  Distal interlock screws 30 and 42 mm.  DESCRIPTION OF PROCEDURE:  After induction of general anesthesia, the patient was placed on the [facility] table with standard fixation and positioning.  Well-leg holder for the right leg, arms across the chest, careful padding, distraction, and internal rotation.  C-arm was brought in and reduction was checked, fracture was out to length, and there was slight sag posteriorly at the fracture site as expected for the femoral shaft fracture.  Standard prepping and draping was performed.  Large shower curtain, Betadine, and Steri-Drape was used.  Time-out procedure completed.  2 g Ancef was given.  Incision was made with proximal trochanter and gluteus medius fascia split.  Pin was placed at the tip of the trochanter and checked under fluoroscopy, drilled, over reamed, beaded tip rod was passed using the reduction tool across the fracture down to the knee stopping 6 cm proximal to the total knee arthroplasty to prevent significant stress riser.  An 11 mm nail was selected due to her small size and this was very tight.  No reaming was performed.  It was impacted down and padded down with a hammer with good reduction out to length,  spiral oblique fracture reduced nicely without angulation. Proximal interlock was placed under fluoroscopic visualization and 90 mm screw was inserted, it was low in the neck on AP and directly center of the head on lateral.  Distal interlocks were done with a freehand technique under fluoroscopy and 2 interlocks placed and sized as above.  Irrigation and closure of fascia #1 gluteus medius fascia, 2-0 on the subcutaneous tissue, skin staple closure, Marcaine infiltration, and postop dressings with Xeroform, 4x4s, and tape. Instrument count and needle count were correct.  The patient tolerated the procedure well and she was transferred to recovery room.     Ladell Lea C. Ophelia Charter, M.D.     MCY/MEDQ  D:  09/29/2014  T:  09/30/2014  Job:  [id number]

## 2014-10-01 LAB — GLUCOSE, CAPILLARY
GLUCOSE-CAPILLARY: 84 mg/dL (ref 70–99)
GLUCOSE-CAPILLARY: 89 mg/dL (ref 70–99)

## 2014-10-01 LAB — HEMOGLOBIN AND HEMATOCRIT, BLOOD
HCT: 26.3 % — ABNORMAL LOW (ref 36.0–46.0)
Hemoglobin: 8.6 g/dL — ABNORMAL LOW (ref 12.0–15.0)

## 2014-10-01 LAB — PREPARE RBC (CROSSMATCH)

## 2014-10-01 LAB — CBC
HCT: 24.7 % — ABNORMAL LOW (ref 36.0–46.0)
Hemoglobin: 8.3 g/dL — ABNORMAL LOW (ref 12.0–15.0)
MCH: 30 pg (ref 26.0–34.0)
MCHC: 33.6 g/dL (ref 30.0–36.0)
MCV: 89.2 fL (ref 78.0–100.0)
Platelets: 92 10*3/uL — ABNORMAL LOW (ref 150–400)
RBC: 2.77 MIL/uL — AB (ref 3.87–5.11)
RDW: 15.2 % (ref 11.5–15.5)
WBC: 8.6 10*3/uL (ref 4.0–10.5)

## 2014-10-01 MED ORDER — FUROSEMIDE 10 MG/ML IJ SOLN
20.0000 mg | Freq: Once | INTRAMUSCULAR | Status: AC
  Administered 2014-10-01: 20 mg via INTRAVENOUS
  Filled 2014-10-01: qty 2

## 2014-10-01 MED ORDER — SODIUM CHLORIDE 0.9 % IV SOLN: Freq: Once | INTRAVENOUS | Status: DC

## 2014-10-01 NOTE — Progress Notes (Signed)
Physical Therapy Treatment Patient Details Name: [name] MRN: [id number] DOB: 03-30-1933 Today's Date: 10/01/2014    History of Present Illness 81 yo female adm after fall, now s/p IM nail L femur; PMHx: HTN, dizziness, COPD, Ischemic cardiomyopathy, Chronic systolic heart failure , S/P CABG, with Automatic implantable cardioverter-defibrillator in situ.     PT Comments    Pt will benefit from SNF level therapies; pt requires encouragement to participate, incr time, she is progressing although slowly  Follow Up Recommendations  SNF;Supervision/Assistance - 24 hour     Equipment Recommendations  Rolling walker with 5" wheels    Recommendations for Other Services       Precautions / Restrictions Precautions Precautions: Fall Restrictions Other Position/Activity Restrictions: WBAT    Mobility  Bed Mobility Overal bed mobility: Needs Assistance Bed Mobility: Supine to Sit     Supine to sit: Mod assist;+2 for safety/equipment     General bed mobility comments: pt requiring assist with trunk, LEs, bed pad utilized to assist pt; multimodal cues for   Transfers Overall transfer level: Needs assistance Equipment used: Rolling walker (2 wheeled) Transfers: Sit to/from Stand Sit to Stand: Mod assist         General transfer comment: cues for hand placement, control of descent  Ambulation/Gait Ambulation/Gait assistance: Min assist;+2 safety/equipment Ambulation Distance (Feet): 11 Feet Assistive device: Rolling walker (2 wheeled) Gait Pattern/deviations: Step-to pattern;Antalgic     General Gait Details: multimodal cues for sequence, RW position,  incr Wt on LLE as tol; chair follow for sfety, pt fatigues quickly   Stairs            Wheelchair Mobility    Modified Rankin (Stroke Patients Only)       Balance Overall balance assessment: History of Falls;Needs assistance   Sitting balance-Leahy Scale: Fair       Standing balance-Leahy Scale:  Poor                      Cognition Arousal/Alertness: Awake/alert Behavior During Therapy: WFL for tasks assessed/performed Overall Cognitive Status: Within Functional Limits for tasks assessed                      Exercises General Exercises - Lower Extremity Ankle Circles/Pumps: AROM;Both;10 reps Quad Sets: AROM;Both;5 reps    General Comments General comments (skin integrity, edema, etc.): pt dizzy on EOB adn equally so in standing position; BP 136/38      Pertinent Vitals/Pain Pain Assessment: 0-10 Pain Score: 5  Pain Location: L hip and back Pain Descriptors / Indicators: Constant Pain Intervention(s): Limited activity within patient's tolerance;Monitored during session;Ice applied;Patient requesting pain meds-RN notified    Home Living                      Prior Function            PT Goals (current goals can now be found in the care plan section) Acute Rehab PT Goals Patient Stated Goal: to go home PT Goal Formulation: With patient/family Time For Goal Achievement: 10/07/14 Potential to Achieve Goals: Good Progress towards PT goals: Progressing toward goals    Frequency  Min 3X/week    PT Plan Current plan remains appropriate    Co-evaluation             End of Session Equipment Utilized During Treatment: Gait belt Activity Tolerance: Patient tolerated treatment well;Patient limited by fatigue Patient left: in chair;with call bell/phone  within reach     Time: 0936-1004 PT Time Calculation (min) (ACUTE ONLY): 28 min  Charges:  $Gait Training: 8-22 mins $Therapeutic Activity: 8-22 mins                    G Codes:      [name] October 28, 2014, 10:53 AM

## 2014-10-01 NOTE — Clinical Documentation Improvement (Signed)
Bump in creatinine from 1.10 on 1/16 4:40am to 1.43 1/6 at 6:40pm.  Please identify any clinical conditions associated with the abnormal creatinine (if any) and document in your progress note and carry over to your discharge summary. Component      BUN Creatinine  Latest Ref Rng      6 - 23 mg/dL 1.24 - 5.80 mg/dL  06/03/8337      28 (H) 2.50 (H)  09/30/2014     4:40 AM 32 (H) 1.10  09/30/2014     6:40 PM 40 (H) 1.43 (H)  10/01/2014     5:00 AM    10/01/2014     8:24 AM    10/01/2014     12:05 PM     Component      GFR calc non Af Amer  Latest Ref Rng      >90 mL/min  09/29/2014      45 (L)  09/30/2014     4:40 AM 46 (L)  09/30/2014     6:40 PM 34 (L)   Possible Clinical Conditions: -Acute Kidney Injury / acute renal failure -Acute on chronic renal failure (if present, please also document stage of CKD) -Other condition (please specify) -Unable to determine at present  Thank you, Doy Mince, RN [phone] Clinical Documentation Specialist

## 2014-10-01 NOTE — Progress Notes (Signed)
Subjective: 2 Days Post-Op Procedure(s) (LRB): Affixus Trochanteric Femoral Nail (Left) Patient reports pain as moderate.    Objective: Vital signs in last 24 hours: Temp:  [97.8 F (36.6 C)-98.8 F (37.1 C)] 98.7 F (37.1 C) (01/07 2053) Pulse Rate:  [64-77] 73 (01/07 2053) Resp:  [14-21] 18 (01/07 2053) BP: (111-185)/(33-71) 169/66 mmHg (01/07 2053) SpO2:  [95 %-100 %] 95 % (01/07 2053)  Intake/Output from previous day: 01/06 0701 - 01/07 0700 In: 1806.3 [P.O.:100; I.V.:1371.3; Blood:335] Out: 550 [Urine:550] Intake/Output this shift: Total I/O In: 0  Out: 500 [Urine:500]   Recent Labs  09/29/14 1454 09/30/14 0440 09/30/14 1840 10/01/14 0500 10/01/14 1205  HGB 11.7* 9.1* 7.2* 8.6* 8.3*    Recent Labs  09/30/14 1840 10/01/14 0500 10/01/14 1205  WBC 9.4  --  8.6  RBC 2.51*  --  2.77*  HCT 22.2* 26.3* 24.7*  PLT 111*  --  92*    Recent Labs  09/30/14 0440 09/30/14 1840  NA 134* 129*  K 4.5 4.4  CL 102 104  CO2 25 17*  BUN 32* 40*  CREATININE 1.10 1.43*  GLUCOSE 144* 153*  CALCIUM 8.6 8.2*    Recent Labs  09/29/14 1454  INR 1.04    Neurologically intact  Assessment/Plan: 2 Days Post-Op Procedure(s) (LRB): Affixus Trochanteric Femoral Nail (Left) Up with therapy likely SNF.   Osman Calzadilla C 10/01/2014, 9:23 PM

## 2014-10-01 NOTE — Progress Notes (Signed)
Subjective: Patient complains of feeling tired/fatigued especially when she is up.  No complaints of cp, sob.  Some hip pain.     Objective: Vital signs in last 24 hours: Temp:  [97.6 F (36.4 C)-98.8 F (37.1 C)] 98.2 F (36.8 C) (01/07 1254) Pulse Rate:  [64-78] 70 (01/07 1254) Resp:  [14-18] 14 (01/07 1254) BP: (110-150)/(33-66) 150/35 mmHg (01/07 1254) SpO2:  [95 %-100 %] 100 % (01/07 1254)  Intake/Output from previous day: 01/06 0701 - 01/07 0700 In: 1806.3 [P.O.:100; I.V.:1371.3; Blood:335] Out: 550 [Urine:550] Intake/Output this shift:     Recent Labs  09/29/14 1454 09/30/14 0440 09/30/14 1840 10/01/14 0500 10/01/14 1205  HGB 11.7* 9.1* 7.2* 8.6* 8.3*    Recent Labs  09/30/14 1840 10/01/14 0500 10/01/14 1205  WBC 9.4  --  8.6  RBC 2.51*  --  2.77*  HCT 22.2* 26.3* 24.7*  PLT 111*  --  92*    Recent Labs  09/30/14 0440 09/30/14 1840  NA 134* 129*  K 4.5 4.4  CL 102 104  CO2 25 17*  BUN 32* 40*  CREATININE 1.10 1.43*  GLUCOSE 144* 153*  CALCIUM 8.6 8.2*    Recent Labs  09/29/14 1454  INR 1.04    Exam:  Alert and oriented.  Left hip/leg wounds look good.  Staples intact.  No drainage or signs of infection.  bilat calves nontender, NVI.   Assessment/Plan: Symptomatic ABLA.  Will transfuse one unit PRBC's.  Patient has not made up her mind about d/c planning.  Advised her that this needs to be done soon.  Continue present care.     Charlott Calvario M 10/01/2014, 1:12 PM

## 2014-10-01 NOTE — Progress Notes (Signed)
[facility] PROGRESS NOTE  [name] VWU:[id number] DOB: [birth year] DOA: 09/29/2014 PCP: Pearla Dubonnet, MD  Assessment/Plan: 1. Left hip pain: From left femoral fracture. Underwent surgery on 1/5. Pain control and physical therapy evaluation.   Anemia of blood loss: Probably from surgery. Received one unit of prbc and repeat H&H is 8.4  Lethargy: probably from a combination of pain meds and phenergan. Improved.   Continue to monitor.    Code Status: full code Family Communication: none at bedside Disposition Plan: pening   Consultants:  Orthopedics.  Procedures: Left AFFIXUS trochanteric nail proximal and distal interlock fixation of left femoral shaft fracture. Antibiotics:  none  HPI/Subjective: Pain better controlled.   Objective: Filed Vitals:   10/01/14 1254  BP: 150/35  Pulse: 70  Temp: 98.2 F (36.8 C)  Resp: 14    Intake/Output Summary (Last 24 hours) at 10/01/14 1314 Last data filed at 10/01/14 0646  Gross per 24 hour  Intake 1706.25 ml  Output    250 ml  Net 1456.25 ml   There were no vitals filed for this visit.  Exam:   General:  Alert afebrile comfortable  Cardiovascular: s1s2  Respiratory: ctab  Abdomen: soft non tender non distended bowel sounds heard  Musculoskeletal:  No pedal edema.   Data Reviewed: Basic Metabolic Panel:  Recent Labs Lab 09/29/14 1454 09/30/14 0440 09/30/14 1840  NA 137 134* 129*  K 4.9 4.5 4.4  CL 105 102 104  CO2 24 25 17*  GLUCOSE 163* 144* 153*  BUN 28* 32* 40*  CREATININE 1.12* 1.10 1.43*  CALCIUM 9.4 8.6 8.2*   Liver Function Tests: No results for input(s): AST, ALT, ALKPHOS, BILITOT, PROT, ALBUMIN in the last 168 hours. No results for input(s): LIPASE, AMYLASE in the last 168 hours.  Recent Labs Lab 09/30/14 1956  AMMONIA <9*   CBC:  Recent Labs Lab 09/29/14 1454 09/30/14 0440 09/30/14 1840 10/01/14 0500 10/01/14 1205  WBC 11.5* 9.9 9.4  --  8.6  NEUTROABS  9.6*  --   --   --   --   HGB 11.7* 9.1* 7.2* 8.6* 8.3*  HCT 36.5 28.0* 22.2* 26.3* 24.7*  MCV 89.5 88.9 88.4  --  89.2  PLT 184 123* 111*  --  92*   Cardiac Enzymes: No results for input(s): CKTOTAL, CKMB, CKMBINDEX, TROPONINI in the last 168 hours. BNP (last 3 results)  Recent Labs  05/14/14 1433 06/03/14 1627  PROBNP 1346.0* 70.0   CBG:  Recent Labs Lab 10/01/14 0824  GLUCAP 84    Recent Results (from the past 240 hour(s))  Surgical pcr screen     Status: Abnormal   Collection Time: 09/29/14  5:33 PM  Result Value Ref Range Status   MRSA, PCR POSITIVE (A) NEGATIVE Final    Comment: RESULT CALLED TO, READ BACK BY AND VERIFIED WITH: SPOKE WITH YOUNG,S RN [phone] [id number] COVINGTON,N    Staphylococcus aureus POSITIVE (A) NEGATIVE Final    Comment:        The Xpert SA Assay (FDA approved for NASAL specimens in patients over 28 years of age), is one component of a comprehensive surveillance program.  Test performance has been validated by [employer] for patients greater than or equal to 71 year old. It is not intended to diagnose infection nor to guide or monitor treatment.      Studies: Dg Chest 1 View  09/29/2014   CLINICAL DATA:  Recent fall  EXAM: CHEST - 1 VIEW  COMPARISON:  05/14/1949  FINDINGS: Cardiac shadow is stable. A defibrillator is again noted. Postsurgical changes consistent with valvular repair are again seen. Cervical spine surgical changes are again noted. The lungs are well aerated with mild interstitial changes stable from the prior exam. Hyperexpansion is again identified. No focal infiltrate or sizable effusion is seen.  IMPRESSION: COPD without acute abnormality.   Electronically Signed   By: Alcide Clever M.D.   On: 09/29/2014 15:35   Dg Chest Port 1 View  09/30/2014   CLINICAL DATA:  Sudden hypoxia. Post left femur fracture repair yesterday.  EXAM: PORTABLE CHEST - 1 VIEW  COMPARISON:  Chest radiograph obtained yesterday.  FINDINGS:  Cardiomediastinal contours are unchanged. Patient is post median sternotomy with prosthetic mitral valve. Multi lead left-sided pacemaker remains in place. Pulmonary vasculature is normal. No developing airspace consolidation. Trace blunting of left costophrenic angle, may reflect diminutive pleural effusion versus atelectasis. No pneumothorax. The lungs remain hyperinflated.  IMPRESSION: Question diminutive left pleural effusion versus atelectasis. Otherwise unchanged appearance of the chest with stable hyperinflation.   Electronically Signed   By: Rubye Oaks M.D.   On: 09/30/2014 19:27   Dg C-arm 1-60 Min-no Report  09/29/2014   CLINICAL DATA:  Left femur fracture.  EXAM: DG C-ARM 1-60 MIN - NRPT [facility]; DG FEMUR 2+V*L*  COMPARISON:  Radiographs 09/29/2014  FINDINGS: Fluoroscopic spot images demonstrate placement of a intra medullary rod in the left femur transfixing the long subtrochanteric fracture. There is a proximal dynamic hip screw and 2 distal interlocking screws.  IMPRESSION: Internal fixation of left femoral shaft fracture with an intramedullary rod.   Electronically Signed   By: Loralie Champagne M.D.   On: 09/29/2014 20:14   Dg Femur Min 2 Views Left  09/29/2014   CLINICAL DATA:  Left femur fracture.  EXAM: DG C-ARM 1-60 MIN - NRPT [facility]; DG FEMUR 2+V*L*  COMPARISON:  Radiographs 09/29/2014  FINDINGS: Fluoroscopic spot images demonstrate placement of a intra medullary rod in the left femur transfixing the long subtrochanteric fracture. There is a proximal dynamic hip screw and 2 distal interlocking screws.  IMPRESSION: Internal fixation of left femoral shaft fracture with an intramedullary rod.   Electronically Signed   By: Loralie Champagne M.D.   On: 09/29/2014 20:14   Dg Femur Min 2 Views Left  09/29/2014   CLINICAL DATA:  Recent fall with leg pain  EXAM: DG FEMUR 2+V*L*  COMPARISON:  None.  FINDINGS: There is an oblique fracture through the proximal diaphysis of the left femur. Mild  displacement at the fracture site is noted and as well as rotation of the distal fracture fragment. No other fractures are noted. A left knee replacement is seen.  IMPRESSION: Midshaft oblique fracture of the left femur with mild overlap and angulation of the distal fracture fragment with respect to the proximal fracture fragment.   Electronically Signed   By: Alcide Clever M.D.   On: 09/29/2014 15:38    Scheduled Meds: . sodium chloride   Intravenous Once  . carvedilol  12.5 mg Oral BID WC  . Chlorhexidine Gluconate Cloth  6 each Topical Q0600  . docusate sodium  100 mg Oral BID  . feeding supplement (RESOURCE BREEZE)  1 Container Oral TID BM  . irbesartan  75 mg Oral Daily  . levothyroxine  75 mcg Oral QAC breakfast  . mupirocin ointment  1 application Nasal BID  . pantoprazole  40 mg Oral BID   Continuous Infusions: . sodium chloride 75 mL/hr  at 10/01/14 1038    Principal Problem:   Femur fracture, left Active Problems:   Automatic implantable cardioverter-defibrillator in situ   S/P CABG (coronary artery bypass graft)   Coronary atherosclerosis of native coronary artery   S/P mitral valve repair   Hypothyroidism    Time spent: 15 min    [name]  [facility] Pager [phone]. If 7PM-7AM, please contact night-coverage at [email or web address], password [email or web address] 10/01/2014, 1:14 PM  LOS: 2 days

## 2014-10-01 NOTE — Progress Notes (Signed)
Clinical Social Work  CSW met with patient at bedside to discuss DC plans again. Patient reports she has worked with PT and OT today and is tired. Patient reports she has not spoken with her husband about DC plans. Patient reports she has not made a decision about [facility] vs going to another SNF or if she wants to DC back to her apartment. CSW suggested that CSW and patient call husband to discuss DC plans but patient reports husband is resting and now is not a good time to call him. CSW will continue to follow in order to create a DC plan for when patient is medically stable.  [place], [place] [phone]

## 2014-10-01 NOTE — Evaluation (Signed)
Occupational Therapy Evaluation Patient Details Name: [name] MRN: [id number] DOB: 28-Sep-1932 Today's Date: 10/01/2014    History of Present Illness 81 yo female adm after fall, now s/p IM nail L femur; PMHx: HTN, dizziness, COPD, Ischemic cardiomyopathy, Chronic systolic heart failure , S/P CABG, with Automatic implantable cardioverter-defibrillator in situ.    Clinical Impression   Pt with very limited activity tolerance and became very fatigued after only sitting edge of chair to perform grooming and part of bathing task and donning R sock. Pt's husband present for session and really wanting pt to return to their apartment at d/c. Explained recommendation for SNF at this time as pt very fatigued and requiring increased assist at this time. Will follow to progress ADL independence.     Follow Up Recommendations  SNF;Supervision/Assistance - 24 hour    Equipment Recommendations  3 in 1 bedside comode    Recommendations for Other Services       Precautions / Restrictions Precautions Precautions: Fall Restrictions Other Position/Activity Restrictions: WBAT      Mobility Bed Mobility Overal bed mobility: Needs Assistance      Sit to supine: Mod assist   General bed mobility comments: to bring LEs up onto bed.   Transfers Overall transfer level: Needs assistance Equipment used: Rolling walker (2 wheeled) Transfers: Sit to/from Stand Sit to Stand: Mod assist         General transfer comment: assist to rise and steady. pt fatigued after bathing, dressing.    Balance Overall balance assessment: History of Falls;Needs assistance   Sitting balance-Leahy Scale: Fair       Standing balance-Leahy Scale: Poor                              ADL Overall ADL's : Needs assistance/impaired Eating/Feeding: Independent;Sitting   Grooming: Wash/dry hands;Set up;Sitting   Upper Body Bathing: Set up;Sitting   Lower Body Bathing: Maximal assistance;Sit  to/from stand   Upper Body Dressing : Minimal assistance;Sitting   Lower Body Dressing: Maximal assistance;Sit to/from stand Lower Body Dressing Details (indicate cue type and reason): able to don and doff R sock with increased effort and fatigued after. unable to don L sock.  Toilet Transfer: Moderate assistance;Stand-pivot;RW;+2 for safety/equipment   Toileting- Clothing Manipulation and Hygiene: Maximal assistance;Sit to/from stand         General ADL Comments: Pt fatigued after sitting edge of chair approximately 10 minutes to wash face and OT assist washing her back. She also attempted to don/doff R sock but this was very effortful and pt fatigued after doing this. She attempted to doff L sock but unable. Pt requesting to return to bed and +2 for safety mod assist to stand and min assist and increased time to pivot back to bed. Educated on AE options and pt has a Librarian, academic. Demonstrated sock aid for pt and spouse. Discussed with both the recommendation for SNF at d/c. Husband reporting he still wants to take pt home but pt appears agreeable to SNF.      Vision                     Perception     Praxis      Pertinent Vitals/Pain Pain Assessment: 0-10 Pain Score: 5  Pain Location: L hip and back Pain Descriptors / Indicators: Aching Pain Intervention(s): Repositioned     Hand Dominance Right   Extremity/Trunk Assessment Upper  Extremity Assessment Upper Extremity Assessment: Generalized weakness           Communication Communication Communication: No difficulties   Cognition Arousal/Alertness: Awake/alert Behavior During Therapy: WFL for tasks assessed/performed Overall Cognitive Status: Within Functional Limits for tasks assessed                     General Comments       Exercises Exercises: General Lower Extremity     Shoulder Instructions      Home Living Family/patient expects to be discharged to:: Private residence Living  Arrangements: Spouse/significant other Available Help at Discharge: Family;Available 24 hours/day Type of Home: House Home Access: Level entry     Home Layout: One level     Bathroom Shower/Tub: Producer, television/film/video: Standard     Home Equipment: Crutches;Cane - single point;Shower seat;Grab bars - tub/shower   Additional Comments: walk in shower with seat      Prior Functioning/Environment Level of Independence: Independent             OT Diagnosis: Generalized weakness   OT Problem List: Decreased strength;Decreased knowledge of use of DME or AE   OT Treatment/Interventions: Self-care/ADL training;Patient/family education;Therapeutic activities;DME and/or AE instruction    OT Goals(Current goals can be found in the care plan section) Acute Rehab OT Goals Patient Stated Goal: to go home OT Goal Formulation: With patient/family Time For Goal Achievement: 10/08/14 Potential to Achieve Goals: Good  OT Frequency: Min 2X/week   Barriers to D/C:            Co-evaluation              End of Session Equipment Utilized During Treatment: Gait belt;Rolling walker  Activity Tolerance: Patient limited by fatigue;Patient limited by pain Patient left: in bed;with call bell/phone within reach;with family/visitor present   Time: 1110-1143 OT Time Calculation (min): 33 min Charges:  OT General Charges $OT Visit: 1 Procedure OT Evaluation $Initial OT Evaluation Tier I: 1 Procedure OT Treatments $Self Care/Home Management : 8-22 mins $Therapeutic Activity: 8-22 mins G-Codes:    Lennox Laity  [phone] 10/01/2014, 12:48 PM

## 2014-10-02 LAB — TYPE AND SCREEN
ABO/RH(D): A POS
Antibody Screen: NEGATIVE
Unit division: 0
Unit division: 0

## 2014-10-02 LAB — BASIC METABOLIC PANEL
Anion gap: 8 (ref 5–15)
BUN: 23 mg/dL (ref 6–23)
CALCIUM: 8.3 mg/dL — AB (ref 8.4–10.5)
CO2: 22 mmol/L (ref 19–32)
Chloride: 106 mEq/L (ref 96–112)
Creatinine, Ser: 0.86 mg/dL (ref 0.50–1.10)
GFR, EST AFRICAN AMERICAN: 73 mL/min — AB (ref >90)
GFR, EST NON AFRICAN AMERICAN: 63 mL/min — AB (ref >90)
Glucose, Bld: 105 mg/dL — ABNORMAL HIGH (ref 70–99)
POTASSIUM: 3.3 mmol/L — AB (ref 3.5–5.1)
Sodium: 136 mmol/L (ref 135–145)

## 2014-10-02 LAB — CBC
HEMATOCRIT: 29.7 % — AB (ref 36.0–46.0)
Hemoglobin: 10 g/dL — ABNORMAL LOW (ref 12.0–15.0)
MCH: 29.7 pg (ref 26.0–34.0)
MCHC: 33.7 g/dL (ref 30.0–36.0)
MCV: 88.1 fL (ref 78.0–100.0)
PLATELETS: 94 10*3/uL — AB (ref 150–400)
RBC: 3.37 MIL/uL — ABNORMAL LOW (ref 3.87–5.11)
RDW: 14.8 % (ref 11.5–15.5)
WBC: 7.4 10*3/uL (ref 4.0–10.5)

## 2014-10-02 MED ORDER — POTASSIUM CHLORIDE CRYS ER 20 MEQ PO TBCR
40.0000 meq | EXTENDED_RELEASE_TABLET | Freq: Two times a day (BID) | ORAL | Status: AC
  Administered 2014-10-02 (×2): 40 meq via ORAL
  Filled 2014-10-02 (×2): qty 2

## 2014-10-02 NOTE — Progress Notes (Signed)
[facility] PROGRESS NOTE  [name] OPF:[id number] DOB: [birth year] DOA: 09/29/2014 PCP: Pearla Dubonnet, MD  Assessment/Plan: 1. Left hip pain: From left femoral fracture. Underwent surgery on 1/5. Pain control and physical therapy evaluation.   Anemia of blood loss: Probably from surgery. Received one unit of prbc and repeat H&H is 10  Lethargy: probably from a combination of pain meds and phenergan. Improved.   Continue to monitor.    Hypokalemia replete as needed.   Code Status: full code Family Communication: none at bedside Disposition Plan: d/c to SNF in am.    Consultants:  Orthopedics.  Procedures: Left AFFIXUS trochanteric nail proximal and distal interlock fixation of left femoral shaft fracture. Antibiotics:  none  HPI/Subjective: Pain better controlled.   Objective: Filed Vitals:   10/02/14 1420  BP: 159/50  Pulse: 64  Temp: 98.3 F (36.8 C)  Resp: 20    Intake/Output Summary (Last 24 hours) at 10/02/14 1549 Last data filed at 10/02/14 0655  Gross per 24 hour  Intake      0 ml  Output   1700 ml  Net  -1700 ml   Filed Weights   10/02/14 0900  Weight: 51.2 kg (112 lb 14 oz)    Exam:   General:  Alert afebrile comfortable  Cardiovascular: s1s2  Respiratory: ctab  Abdomen: soft non tender non distended bowel sounds heard  Musculoskeletal:  No pedal edema.   Data Reviewed: Basic Metabolic Panel:  Recent Labs Lab 09/29/14 1454 09/30/14 0440 09/30/14 1840 10/02/14 0501  NA 137 134* 129* 136  K 4.9 4.5 4.4 3.3*  CL 105 102 104 106  CO2 24 25 17* 22  GLUCOSE 163* 144* 153* 105*  BUN 28* 32* 40* 23  CREATININE 1.12* 1.10 1.43* 0.86  CALCIUM 9.4 8.6 8.2* 8.3*   Liver Function Tests: No results for input(s): AST, ALT, ALKPHOS, BILITOT, PROT, ALBUMIN in the last 168 hours. No results for input(s): LIPASE, AMYLASE in the last 168 hours.  Recent Labs Lab 09/30/14 1956  AMMONIA <9*   CBC:  Recent Labs Lab  09/29/14 1454 09/30/14 0440 09/30/14 1840 10/01/14 0500 10/01/14 1205 10/02/14 0501  WBC 11.5* 9.9 9.4  --  8.6 7.4  NEUTROABS 9.6*  --   --   --   --   --   HGB 11.7* 9.1* 7.2* 8.6* 8.3* 10.0*  HCT 36.5 28.0* 22.2* 26.3* 24.7* 29.7*  MCV 89.5 88.9 88.4  --  89.2 88.1  PLT 184 123* 111*  --  92* 94*   Cardiac Enzymes: No results for input(s): CKTOTAL, CKMB, CKMBINDEX, TROPONINI in the last 168 hours. BNP (last 3 results)  Recent Labs  05/14/14 1433 06/03/14 1627  PROBNP 1346.0* 70.0   CBG:  Recent Labs Lab 10/01/14 0824 10/01/14 1143  GLUCAP 84 89    Recent Results (from the past 240 hour(s))  Surgical pcr screen     Status: Abnormal   Collection Time: 09/29/14  5:33 PM  Result Value Ref Range Status   MRSA, PCR POSITIVE (A) NEGATIVE Final    Comment: RESULT CALLED TO, READ BACK BY AND VERIFIED WITH: SPOKE WITH YOUNG,S RN [phone] [id number] COVINGTON,N    Staphylococcus aureus POSITIVE (A) NEGATIVE Final    Comment:        The Xpert SA Assay (FDA approved for NASAL specimens in patients over 83 years of age), is one component of a comprehensive surveillance program.  Test performance has been validated by [employer] for patients greater  than or equal to 42 year old. It is not intended to diagnose infection nor to guide or monitor treatment.      Studies: Dg Chest Port 1 View  09/30/2014   CLINICAL DATA:  Sudden hypoxia. Post left femur fracture repair yesterday.  EXAM: PORTABLE CHEST - 1 VIEW  COMPARISON:  Chest radiograph obtained yesterday.  FINDINGS: Cardiomediastinal contours are unchanged. Patient is post median sternotomy with prosthetic mitral valve. Multi lead left-sided pacemaker remains in place. Pulmonary vasculature is normal. No developing airspace consolidation. Trace blunting of left costophrenic angle, may reflect diminutive pleural effusion versus atelectasis. No pneumothorax. The lungs remain hyperinflated.  IMPRESSION: Question diminutive left  pleural effusion versus atelectasis. Otherwise unchanged appearance of the chest with stable hyperinflation.   Electronically Signed   By: Rubye Oaks M.D.   On: 09/30/2014 19:27    Scheduled Meds: . sodium chloride   Intravenous Once  . sodium chloride   Intravenous Once  . carvedilol  12.5 mg Oral BID WC  . Chlorhexidine Gluconate Cloth  6 each Topical Q0600  . docusate sodium  100 mg Oral BID  . feeding supplement (RESOURCE BREEZE)  1 Container Oral TID BM  . irbesartan  75 mg Oral Daily  . levothyroxine  75 mcg Oral QAC breakfast  . mupirocin ointment  1 application Nasal BID  . pantoprazole  40 mg Oral BID  . potassium chloride  40 mEq Oral BID   Continuous Infusions:    Principal Problem:   Femur fracture, left Active Problems:   Automatic implantable cardioverter-defibrillator in situ   S/P CABG (coronary artery bypass graft)   Coronary atherosclerosis of native coronary artery   S/P mitral valve repair   Hypothyroidism    Time spent: 15 min    Hughes Wyndham  [facility] Pager [phone]. If 7PM-7AM, please contact night-coverage at [email or web address], password [facility] 10/02/2014, 3:49 PM  LOS: 3 days

## 2014-10-02 NOTE — Progress Notes (Signed)
CARE MANAGEMENT NOTE 10/02/2014  Patient:  [name], [name]   Account Number:  [id number]  Date Initiated:  09/30/2014  Documentation initiated by:  Ferdinand Cava  Subjective/Objective Assessment:   81 yo female admitted with Left Femure Fracture from IL at [facility]     Action/Plan:   discharge planning   Anticipated DC Date:  10/03/2014   Anticipated DC Plan:    In-house referral  Clinical Social Worker      DC Planning Services  CM consult      Choice offered to / List presented to:             Status of service:  In process, will continue to follow Medicare Important Message given?  YES (If response is "NO", the following Medicare IM given date fields will be blank) Date Medicare IM given:  10/02/2014 Medicare IM given by:  Ferdinand Cava Date Additional Medicare IM given:   Additional Medicare IM given by:    Discharge Disposition:  SKILLED NURSING FACILITY  Per UR Regulation:  Reviewed for med. necessity/level of care/duration of stay  If discussed at Long Length of Stay Meetings, dates discussed:    Comments:  09/30/14 Ferdinand Cava RN [facility] CM [phone] Patient stated that she lives in an independent apartment at [place] with her spouse. She has a walker and a cane but does not use, her and her husband drive to appointments. Her PCP is Dr. Kevan Ny. She stated that she prefers [facility] services for rehab either with the [facility] rehab group or an outside agency but will consider a rehab facility if recommended. Will continue to follow, await PT recommendation

## 2014-10-02 NOTE — Progress Notes (Signed)
Clinical Social Work  CSW met with patient and husband at bedside. After discussion about DC plans, patient and husband both agreeable for patient to go to skilled care at [facility]. Patient reports she would prefer to return home but understanding that husband cannot provide care for her at home. Patient and husband want [facility] to provide transportation whenever medically stable.  CSW spoke with attending MD who reports patient might be stable to DC over the weekend. CSW updated [facility] who reported they can accept patient over the weekend. Weekend CSW to contact supervisor at [phone] and fax DC summary to [phone].   [place], [place] [phone]

## 2014-10-02 NOTE — Progress Notes (Signed)
Physical Therapy Treatment Patient Details Name: Cyann I Saldivar MRN: [id number] DOB: 09-26-1932 Today's Date: 10/02/2014    History of Present Illness 81 yo female adm after fall, now s/p IM nail L femur; PMHx: HTN, dizziness, COPD, Ischemic cardiomyopathy, Chronic systolic heart failure , S/P CABG, with Automatic implantable cardioverter-defibrillator in situ, L TKA    PT Comments    Pt progressing slowly, requires encouragement, states she has decided to go to rehab at [facility]; incr gait distance today, have encouraged pt to perform ankle pumps, quad sets and use IS; O2 sats 94-97% on RA, O2 left off and RN aware  Follow Up Recommendations  SNF;Supervision/Assistance - 24 hour     Equipment Recommendations  None recommended by PT    Recommendations for Other Services       Precautions / Restrictions Precautions Precautions: Fall Restrictions Other Position/Activity Restrictions: WBAT    Mobility  Bed Mobility Overal bed mobility: Needs Assistance Bed Mobility: Supine to Sit     Supine to sit: Min assist     General bed mobility comments: incr time, assist with lateral scooting and with LLE; multi-modal cues throughout  Transfers Overall transfer level: Needs assistance Equipment used: Rolling walker (2 wheeled) Transfers: Sit to/from Stand Sit to Stand: Min assist         General transfer comment: heavy min assist for pt size, cues for hand placement and effort/self assist  Ambulation/Gait Ambulation/Gait assistance: Min assist Ambulation Distance (Feet): 20 Feet Assistive device: Rolling walker (2 wheeled) Gait Pattern/deviations: Step-to pattern;Antalgic   Gait velocity interpretation: Below normal speed for age/gender General Gait Details: multimodal cues for sequence, RW position,  incr Wt on LLE as tol; chair follow for sfety, pt fatigues quickly   Stairs            Wheelchair Mobility    Modified Rankin (Stroke Patients Only)        Balance                                    Cognition Arousal/Alertness: Awake/alert Behavior During Therapy: WFL for tasks assessed/performed Overall Cognitive Status: Within Functional Limits for tasks assessed                      Exercises General Exercises - Lower Extremity Ankle Circles/Pumps: AROM;Both;10 reps Quad Sets: AROM;Strengthening;Both;10 reps Heel Slides: AAROM;Left;10 reps    General Comments General comments (skin integrity, edema, etc.): BP 144/46, no c/o dizziness      Pertinent Vitals/Pain Pain Assessment: 0-10 Pain Score: 6  Pain Location: L hip and back Pain Descriptors / Indicators: Constant;Stabbing Pain Intervention(s): Monitored during session;Premedicated before session;Repositioned    Home Living                      Prior Function            PT Goals (current goals can now be found in the care plan section) Acute Rehab PT Goals Patient Stated Goal: to go home PT Goal Formulation: With patient/family Time For Goal Achievement: 10/07/14 Potential to Achieve Goals: Good Progress towards PT goals: Progressing toward goals    Frequency  Min 3X/week    PT Plan Current plan remains appropriate    Co-evaluation             End of Session Equipment Utilized During Treatment: Gait belt Activity Tolerance: Patient tolerated treatment well;Patient  limited by fatigue Patient left: in chair;with call bell/phone within reach;with family/visitor present     Time: 1610-9604 PT Time Calculation (min) (ACUTE ONLY): 43 min  Charges:  $Gait Training: 23-37 mins                    G Codes:      Tynleigh Birt 12-Oct-2014, 11:56 AM

## 2014-10-03 DIAGNOSIS — I255 Ischemic cardiomyopathy: Secondary | ICD-10-CM | POA: Diagnosis not present

## 2014-10-03 DIAGNOSIS — H5213 Myopia, bilateral: Secondary | ICD-10-CM | POA: Diagnosis not present

## 2014-10-03 DIAGNOSIS — J449 Chronic obstructive pulmonary disease, unspecified: Secondary | ICD-10-CM | POA: Diagnosis not present

## 2014-10-03 DIAGNOSIS — I2510 Atherosclerotic heart disease of native coronary artery without angina pectoris: Secondary | ICD-10-CM | POA: Diagnosis not present

## 2014-10-03 DIAGNOSIS — R079 Chest pain, unspecified: Secondary | ICD-10-CM | POA: Diagnosis not present

## 2014-10-03 DIAGNOSIS — K219 Gastro-esophageal reflux disease without esophagitis: Secondary | ICD-10-CM | POA: Diagnosis not present

## 2014-10-03 DIAGNOSIS — S7222XD Displaced subtrochanteric fracture of left femur, subsequent encounter for closed fracture with routine healing: Secondary | ICD-10-CM | POA: Diagnosis not present

## 2014-10-03 DIAGNOSIS — I10 Essential (primary) hypertension: Secondary | ICD-10-CM | POA: Diagnosis not present

## 2014-10-03 DIAGNOSIS — I4891 Unspecified atrial fibrillation: Secondary | ICD-10-CM | POA: Diagnosis not present

## 2014-10-03 DIAGNOSIS — R279 Unspecified lack of coordination: Secondary | ICD-10-CM | POA: Diagnosis not present

## 2014-10-03 DIAGNOSIS — K5900 Constipation, unspecified: Secondary | ICD-10-CM | POA: Diagnosis not present

## 2014-10-03 DIAGNOSIS — G8911 Acute pain due to trauma: Secondary | ICD-10-CM | POA: Diagnosis not present

## 2014-10-03 DIAGNOSIS — S7290XA Unspecified fracture of unspecified femur, initial encounter for closed fracture: Secondary | ICD-10-CM | POA: Diagnosis not present

## 2014-10-03 DIAGNOSIS — R488 Other symbolic dysfunctions: Secondary | ICD-10-CM | POA: Diagnosis not present

## 2014-10-03 DIAGNOSIS — S7292XD Unspecified fracture of left femur, subsequent encounter for closed fracture with routine healing: Secondary | ICD-10-CM | POA: Diagnosis not present

## 2014-10-03 DIAGNOSIS — I5022 Chronic systolic (congestive) heart failure: Secondary | ICD-10-CM | POA: Diagnosis not present

## 2014-10-03 DIAGNOSIS — Z95810 Presence of automatic (implantable) cardiac defibrillator: Secondary | ICD-10-CM | POA: Diagnosis not present

## 2014-10-03 DIAGNOSIS — W19XXXD Unspecified fall, subsequent encounter: Secondary | ICD-10-CM | POA: Diagnosis not present

## 2014-10-03 DIAGNOSIS — J302 Other seasonal allergic rhinitis: Secondary | ICD-10-CM | POA: Diagnosis not present

## 2014-10-03 DIAGNOSIS — S72332D Displaced oblique fracture of shaft of left femur, subsequent encounter for closed fracture with routine healing: Secondary | ICD-10-CM | POA: Diagnosis not present

## 2014-10-03 DIAGNOSIS — M6281 Muscle weakness (generalized): Secondary | ICD-10-CM | POA: Diagnosis not present

## 2014-10-03 DIAGNOSIS — E039 Hypothyroidism, unspecified: Secondary | ICD-10-CM | POA: Diagnosis not present

## 2014-10-03 DIAGNOSIS — I25708 Atherosclerosis of coronary artery bypass graft(s), unspecified, with other forms of angina pectoris: Secondary | ICD-10-CM | POA: Diagnosis not present

## 2014-10-03 DIAGNOSIS — L309 Dermatitis, unspecified: Secondary | ICD-10-CM | POA: Diagnosis not present

## 2014-10-03 DIAGNOSIS — R4182 Altered mental status, unspecified: Secondary | ICD-10-CM | POA: Diagnosis not present

## 2014-10-03 DIAGNOSIS — Z961 Presence of intraocular lens: Secondary | ICD-10-CM | POA: Diagnosis not present

## 2014-10-03 LAB — BASIC METABOLIC PANEL
Anion gap: 2 — ABNORMAL LOW (ref 5–15)
BUN: 19 mg/dL (ref 6–23)
CHLORIDE: 113 meq/L — AB (ref 96–112)
CO2: 24 mmol/L (ref 19–32)
CREATININE: 0.85 mg/dL (ref 0.50–1.10)
Calcium: 8.4 mg/dL (ref 8.4–10.5)
GFR, EST AFRICAN AMERICAN: 74 mL/min — AB (ref >90)
GFR, EST NON AFRICAN AMERICAN: 63 mL/min — AB (ref >90)
Glucose, Bld: 103 mg/dL — ABNORMAL HIGH (ref 70–99)
Potassium: 4.7 mmol/L (ref 3.5–5.1)
Sodium: 139 mmol/L (ref 135–145)

## 2014-10-03 LAB — CBC
HEMATOCRIT: 29.6 % — AB (ref 36.0–46.0)
Hemoglobin: 9.9 g/dL — ABNORMAL LOW (ref 12.0–15.0)
MCH: 30 pg (ref 26.0–34.0)
MCHC: 33.4 g/dL (ref 30.0–36.0)
MCV: 89.7 fL (ref 78.0–100.0)
PLATELETS: 121 10*3/uL — AB (ref 150–400)
RBC: 3.3 MIL/uL — ABNORMAL LOW (ref 3.87–5.11)
RDW: 15.4 % (ref 11.5–15.5)
WBC: 6.3 10*3/uL (ref 4.0–10.5)

## 2014-10-03 LAB — GLUCOSE, CAPILLARY
GLUCOSE-CAPILLARY: 145 mg/dL — AB (ref 70–99)
Glucose-Capillary: 95 mg/dL (ref 70–99)

## 2014-10-03 MED ORDER — BOOST / RESOURCE BREEZE PO LIQD: 1.0000 | Freq: Three times a day (TID) | ORAL | Status: DC

## 2014-10-03 MED ORDER — DSS 100 MG PO CAPS: 100.0000 mg | ORAL_CAPSULE | Freq: Two times a day (BID) | ORAL | Status: DC

## 2014-10-03 MED ORDER — TRAMADOL HCL 50 MG PO TABS: 50.0000 mg | ORAL_TABLET | Freq: Four times a day (QID) | ORAL | Status: DC | PRN

## 2014-10-03 NOTE — Clinical Social Work Placement (Addendum)
Clinical Social Work Department CLINICAL SOCIAL WORK PLACEMENT NOTE 10/03/2014  Patient:  [name], [name]  Account Number:  [id number] Admit date:  09/29/2014  Clinical Social Worker:  Unk Lightning, LCSW  Date/time:  09/30/2014 02:30 PM  Clinical Social Work is seeking post-discharge placement for this patient at the following level of care:   SKILLED NURSING   (*CSW will update this form in Epic as items are completed)   09/30/2014  Patient/family provided with [facility] Department of Clinical Social Work's list of facilities offering this level of care within the geographic area requested by the patient (or if unable, by the patient's family).  09/30/2014  Patient/family informed of their freedom to choose among providers that offer the needed level of care, that participate in Medicare, Medicaid or managed care program needed by the patient, have an available bed and are willing to accept the patient.  09/30/2014  Patient/family informed of [facility]' ownership interest in [facility], as well as of the fact that they are under no obligation to receive care at this facility.  PASARR submitted to EDS on existing # PASARR number received on   FL2 transmitted to all facilities in geographic area requested by pt/family on   FL2 transmitted to all facilities within larger geographic area on   Patient informed that his/her managed care company has contracts with or will negotiate with  certain facilities, including the following:     Patient/family informed of bed offers received:  10/02/2014 Patient chooses bed at [facility] Physician recommends and patient chooses bed at    Patient to be transferred to [facility] on  10/03/2014 Patient to be transferred to facility by ambulance Patient and family notified of transfer on 10/03/2014 Name of family member notified:  spouse david  The following physician request were entered in  Epic:   Additional Comments:  .Elray Buba, LCSW [facility] Clinical Social Worker - Weekend Coverage cell #: [phone]

## 2014-10-03 NOTE — Discharge Summary (Signed)
Physician Discharge Summary  [name] GNF:[id number] DOB: May 22, 1933 DOA: 09/29/2014  PCP: Pearla Dubonnet, MD  Admit date: 09/29/2014 Discharge date: 10/03/2014  Time spent: 30 minutes  Recommendations for Outpatient Follow-up:  1. Follow up with orthopedics and PCP as recommended  Discharge Diagnoses:  Principal Problem:   Femur fracture, left Active Problems:   Automatic implantable cardioverter-defibrillator in situ   S/P CABG (coronary artery bypass graft)   Coronary atherosclerosis of native coronary artery   S/P mitral valve repair   Hypothyroidism   Discharge Condition: improved.   Diet recommendation: regular  Filed Weights   10/02/14 0900  Weight: 51.2 kg (112 lb 14 oz)    History of present illness:  [name] is a 81 y.o. female with a past medical history of coronary artery disease status post CABG, history of mitral valve repair in 2010, history of hypertension, hypothyroidism, history of systolic congestive heart failure previously, but last known EF is about 70%, had a mechanical fall and presented with left hip pain , was found to hae have left femoral fracture  Hospital Course:  1. Left hip pain: From left femoral fracture. Underwent surgery on 1/5. Pain control and physical therapy evaluation recommended rehab and weight bearing as tolerated. .   Anemia of blood loss: Probably from surgery. Received one unit of prbc and repeat H&H is around 10  Lethargy: probably from a combination of pain meds and phenergan. Resolved. She is alert and oriented.  Continue to monitor.    Hypokalemia repleted as needed  Procedures:   Left AFFIXUS trochanteric nail proximal and distal interlock fixation of left femoral shaft fracture. Consultations:  Orthopedics   Discharge Exam: Filed Vitals:   10/03/14 1200  BP:   Pulse:   Temp:   Resp: 20    General: alert afebrile comfortable Cardiovascular: s1s2 Respiratory: ctab  Discharge  Instructions   Discharge Instructions    Discharge instructions    Complete by:  As directed   Follow up with orthopedics as recommended.     Increase activity slowly    Complete by:  As directed      Weight bearing as tolerated    Complete by:  As directed   Laterality:  left  Extremity:  Lower          Current Discharge Medication List    START taking these medications   Details  docusate sodium 100 MG CAPS Take 100 mg by mouth 2 (two) times daily. Qty: 10 capsule, Refills: 0    feeding supplement, RESOURCE BREEZE, (RESOURCE BREEZE) LIQD Take 1 Container by mouth 3 (three) times daily between meals. Refills: 0    traMADol (ULTRAM) 50 MG tablet Take 1 tablet (50 mg total) by mouth every 6 (six) hours as needed. Qty: 40 tablet, Refills: 0      CONTINUE these medications which have NOT CHANGED   Details  acetaminophen (TYLENOL) 500 MG tablet Take 500 mg by mouth every 6 (six) hours as needed for mild pain or moderate pain.    carvedilol (COREG) 12.5 MG tablet Take 1 tablet (12.5 mg total) by mouth 2 (two) times daily. Qty: 180 tablet, Refills: 3    Cholecalciferol (VITAMIN D3) 2000 UNITS capsule Take 2,000 Units by mouth daily.     Coenzyme Q10 (COQ10) 100 MG CAPS Take 100 mg by mouth daily.     levothyroxine (SYNTHROID, LEVOTHROID) 75 MCG tablet Take 75 mcg by mouth daily before breakfast.    montelukast (SINGULAIR) 10 MG  tablet Take 10 mg by mouth daily.     pantoprazole (PROTONIX) 40 MG tablet Take 40 mg by mouth 2 (two) times daily.    valsartan (DIOVAN) 80 MG tablet Take 40 mg by mouth daily.    aspirin EC 81 MG tablet Take 1 tablet (81 mg total) by mouth daily. Qty: 90 tablet, Refills: 3      STOP taking these medications     fluconazole (DIFLUCAN) 100 MG tablet      acetaminophen-codeine (TYLENOL #3) 300-30 MG per tablet        Allergies  Allergen Reactions  . Alprazolam Other (See Comments)    REACTION: ulcer's in mouth and extreme constipation   . Doxycycline Other (See Comments)    REACTION: severe rash over entire body  . Fulvicin P-G [Griseofulvin] Other (See Comments)    Severe headaches  . Hydrocodone Other (See Comments)    REACTION: nausea and totally out of it  . Ketoconazole Other (See Comments)    REACTION: terribly weak, voice shook  . Serevent [Salmeterol] Other (See Comments)    shaking  . Calcitonin (Salmon) Other (See Comments)    REACTION: rash over entire body  . Morphine And Related Nausea And Vomiting  . Amitriptyline     unknown  . Cefuroxime Axetil Other (See Comments)    REACTION: either rash or diarrhea  . Hydrocodone-Acetaminophen Other (See Comments)    REACTION: reaction forgotten. Lorcet, percodan  . Other     Ketacosol= terribly week  . Sulfamethoxazole-Trimethoprim Other (See Comments)    REACTION: either rash or diarrhea  . Sulfonamide Derivatives Other (See Comments)    REACTION: reaction forgotten  . Trovan [Alatrofloxacin] Nausea And Vomiting  . Aspirin Other (See Comments)    REACTION: upsets stomach  . Cephalexin Rash  . Ciprofloxacin Rash  . Clonazepam Other (See Comments)    REACTION: 1/2 pill makes grogginess next day  . Erythromycin Rash  . Hydromorphone Rash  . Oxycodone-Aspirin Other (See Comments)    REACTION: nausea  . Penicillins Rash  . Tapazole [Thiamazole] Rash   Follow-up Information    Follow up with YATES,MARK C, MD In 2 weeks.   Specialty:  Orthopedic Surgery   Contact information:   [address] [place] [address] [phone]       Follow up with GATES,ROBERT NEVILL, MD. Schedule an appointment as soon as possible for a visit in 1 week.   Specialty:  Internal Medicine   Contact information:   [address] [place] [address] [phone]        The results of significant diagnostics from this hospitalization (including imaging, microbiology, ancillary and laboratory) are listed below for reference.    Significant  Diagnostic Studies: Ct Abdomen Pelvis W Wo Contrast  09/04/2014   CLINICAL DATA:  Worsening right-sided abdominal pain for past week.  EXAM: CT ABDOMEN AND PELVIS WITHOUT AND WITH CONTRAST  TECHNIQUE: Multidetector CT imaging of the abdomen and pelvis was performed following the standard protocol before and following the bolus administration of intravenous contrast.  CONTRAST:  80mL OMNIPAQUE IOHEXOL 300 MG/ML  SOLN  COMPARISON:  None.  FINDINGS: Lower chest:  Unremarkable.  Hepatobiliary: No masses or other significant abnormality identified.  Pancreas: No cystic or solid masses identified. No peripancreatic inflammatory changes or fluid collections.  Spleen:  Within normal limits in size and appearance.  Adrenal Glands:  No masses identified.  Kidneys/Urinary Tract: No evidence of urolithiasis or hydronephrosis. No complex cystic  or solid renal masses identified. No masses seen involving lower urinary tract.  Stomach/Bowel/Peritoneum: No evidence of wall thickening, mass, or obstruction.  Vascular/Lymphatic: No pathologically enlarged lymph nodes identified. No other significant abnormality identified.  Reproductive: Prior hysterectomy noted. Adnexal regions are unremarkable.  Other:  None.  Musculoskeletal: No suspicious bone lesions identified. Lumbar spondylosis and scoliosis noted.  IMPRESSION: No acute findings or other significant abnormality identified within the abdomen or pelvis.   Electronically Signed   By: Myles Rosenthal M.D.   On: 09/04/2014 08:54   Dg Chest 1 View  09/29/2014   CLINICAL DATA:  Recent fall  EXAM: CHEST - 1 VIEW  COMPARISON:  05/14/1949  FINDINGS: Cardiac shadow is stable. A defibrillator is again noted. Postsurgical changes consistent with valvular repair are again seen. Cervical spine surgical changes are again noted. The lungs are well aerated with mild interstitial changes stable from the prior exam. Hyperexpansion is again identified. No focal infiltrate or sizable effusion is  seen.  IMPRESSION: COPD without acute abnormality.   Electronically Signed   By: Alcide Clever M.D.   On: 09/29/2014 15:35   Dg Chest Port 1 View  09/30/2014   CLINICAL DATA:  Sudden hypoxia. Post left femur fracture repair yesterday.  EXAM: PORTABLE CHEST - 1 VIEW  COMPARISON:  Chest radiograph obtained yesterday.  FINDINGS: Cardiomediastinal contours are unchanged. Patient is post median sternotomy with prosthetic mitral valve. Multi lead left-sided pacemaker remains in place. Pulmonary vasculature is normal. No developing airspace consolidation. Trace blunting of left costophrenic angle, may reflect diminutive pleural effusion versus atelectasis. No pneumothorax. The lungs remain hyperinflated.  IMPRESSION: Question diminutive left pleural effusion versus atelectasis. Otherwise unchanged appearance of the chest with stable hyperinflation.   Electronically Signed   By: Rubye Oaks M.D.   On: 09/30/2014 19:27   Dg C-arm 1-60 Min-no Report  09/29/2014   CLINICAL DATA:  Left femur fracture.  EXAM: DG C-ARM 1-60 MIN - NRPT [facility]; DG FEMUR 2+V*L*  COMPARISON:  Radiographs 09/29/2014  FINDINGS: Fluoroscopic spot images demonstrate placement of a intra medullary rod in the left femur transfixing the long subtrochanteric fracture. There is a proximal dynamic hip screw and 2 distal interlocking screws.  IMPRESSION: Internal fixation of left femoral shaft fracture with an intramedullary rod.   Electronically Signed   By: Loralie Champagne M.D.   On: 09/29/2014 20:14   Dg Femur Min 2 Views Left  09/29/2014   CLINICAL DATA:  Left femur fracture.  EXAM: DG C-ARM 1-60 MIN - NRPT [facility]; DG FEMUR 2+V*L*  COMPARISON:  Radiographs 09/29/2014  FINDINGS: Fluoroscopic spot images demonstrate placement of a intra medullary rod in the left femur transfixing the long subtrochanteric fracture. There is a proximal dynamic hip screw and 2 distal interlocking screws.  IMPRESSION: Internal fixation of left femoral shaft fracture with an  intramedullary rod.   Electronically Signed   By: Loralie Champagne M.D.   On: 09/29/2014 20:14   Dg Femur Min 2 Views Left  09/29/2014   CLINICAL DATA:  Recent fall with leg pain  EXAM: DG FEMUR 2+V*L*  COMPARISON:  None.  FINDINGS: There is an oblique fracture through the proximal diaphysis of the left femur. Mild displacement at the fracture site is noted and as well as rotation of the distal fracture fragment. No other fractures are noted. A left knee replacement is seen.  IMPRESSION: Midshaft oblique fracture of the left femur with mild overlap and angulation of the distal fracture fragment with respect to  the proximal fracture fragment.   Electronically Signed   By: Alcide Clever M.D.   On: 09/29/2014 15:38    Microbiology: Recent Results (from the past 240 hour(s))  Surgical pcr screen     Status: Abnormal   Collection Time: 09/29/14  5:33 PM  Result Value Ref Range Status   MRSA, PCR POSITIVE (A) NEGATIVE Final    Comment: RESULT CALLED TO, READ BACK BY AND VERIFIED WITH: SPOKE WITH YOUNG,S RN [id number] [id number] COVINGTON,N    Staphylococcus aureus POSITIVE (A) NEGATIVE Final    Comment:        The Xpert SA Assay (FDA approved for NASAL specimens in patients over 80 years of age), is one component of a comprehensive surveillance program.  Test performance has been validated by [employer] for patients greater than or equal to 58 year old. It is not intended to diagnose infection nor to guide or monitor treatment.      Labs: Basic Metabolic Panel:  Recent Labs Lab 09/29/14 1454 09/30/14 0440 09/30/14 1840 10/02/14 0501 10/03/14 0552  NA 137 134* 129* 136 139  K 4.9 4.5 4.4 3.3* 4.7  CL 105 102 104 106 113*  CO2 24 25 17* 22 24  GLUCOSE 163* 144* 153* 105* 103*  BUN 28* 32* 40* 23 19  CREATININE 1.12* 1.10 1.43* 0.86 0.85  CALCIUM 9.4 8.6 8.2* 8.3* 8.4   Liver Function Tests: No results for input(s): AST, ALT, ALKPHOS, BILITOT, PROT, ALBUMIN in the last 168 hours. No  results for input(s): LIPASE, AMYLASE in the last 168 hours.  Recent Labs Lab 09/30/14 1956  AMMONIA <9*   CBC:  Recent Labs Lab 09/29/14 1454 09/30/14 0440 09/30/14 1840 10/01/14 0500 10/01/14 1205 10/02/14 0501 10/03/14 0552  WBC 11.5* 9.9 9.4  --  8.6 7.4 6.3  NEUTROABS 9.6*  --   --   --   --   --   --   HGB 11.7* 9.1* 7.2* 8.6* 8.3* 10.0* 9.9*  HCT 36.5 28.0* 22.2* 26.3* 24.7* 29.7* 29.6*  MCV 89.5 88.9 88.4  --  89.2 88.1 89.7  PLT 184 123* 111*  --  92* 94* 121*   Cardiac Enzymes: No results for input(s): CKTOTAL, CKMB, CKMBINDEX, TROPONINI in the last 168 hours. BNP: BNP (last 3 results)  Recent Labs  05/14/14 1433 06/03/14 1627  PROBNP 1346.0* 70.0   CBG:  Recent Labs Lab 10/01/14 0824 10/01/14 1143 10/03/14 0745 10/03/14 1150  GLUCAP 84 89 95 145*       Signed:  Toniette Devera  [facility] 10/03/2014, 1:50 PM

## 2014-10-03 NOTE — Clinical Social Work Note (Signed)
  CSW reviewed discharge summary and faxed to [facility]  CSW called and spoke with supervisor at [facility] to let them know about pt discharge  CSW prepared discharge packet, provided to RN and called for transport  CSW met with pt and husband at bedside to let them know about discharge  No further CSW needs  CSW signing off  .Dede Query, LCSW [facility] Clinical Social Worker - Weekend Coverage cell #: [phone]

## 2014-10-03 NOTE — Progress Notes (Signed)
Pt stable - reports pain with rom left leg Ankle df pf ok Plan snf monday

## 2014-10-04 DIAGNOSIS — J449 Chronic obstructive pulmonary disease, unspecified: Secondary | ICD-10-CM | POA: Diagnosis not present

## 2014-10-04 DIAGNOSIS — I10 Essential (primary) hypertension: Secondary | ICD-10-CM | POA: Diagnosis not present

## 2014-10-04 DIAGNOSIS — S7290XA Unspecified fracture of unspecified femur, initial encounter for closed fracture: Secondary | ICD-10-CM | POA: Diagnosis not present

## 2014-10-04 DIAGNOSIS — I4891 Unspecified atrial fibrillation: Secondary | ICD-10-CM | POA: Diagnosis not present

## 2014-10-07 DIAGNOSIS — H5213 Myopia, bilateral: Secondary | ICD-10-CM | POA: Diagnosis not present

## 2014-10-07 DIAGNOSIS — Z961 Presence of intraocular lens: Secondary | ICD-10-CM | POA: Diagnosis not present

## 2014-10-12 DIAGNOSIS — L309 Dermatitis, unspecified: Secondary | ICD-10-CM | POA: Diagnosis not present

## 2014-10-20 DIAGNOSIS — S72332D Displaced oblique fracture of shaft of left femur, subsequent encounter for closed fracture with routine healing: Secondary | ICD-10-CM | POA: Diagnosis not present

## 2014-10-22 ENCOUNTER — Encounter: Payer: Self-pay | Admitting: Internal Medicine

## 2014-10-22 ENCOUNTER — Ambulatory Visit (INDEPENDENT_AMBULATORY_CARE_PROVIDER_SITE_OTHER): Payer: Medicare Other | Admitting: Internal Medicine

## 2014-10-22 ENCOUNTER — Encounter: Payer: Self-pay | Admitting: *Deleted

## 2014-10-22 VITALS — BP 172/68 | HR 72 | Ht 60.0 in | Wt 112.8 lb

## 2014-10-22 DIAGNOSIS — Z95810 Presence of automatic (implantable) cardiac defibrillator: Secondary | ICD-10-CM

## 2014-10-22 DIAGNOSIS — I2510 Atherosclerotic heart disease of native coronary artery without angina pectoris: Secondary | ICD-10-CM | POA: Diagnosis not present

## 2014-10-22 DIAGNOSIS — I10 Essential (primary) hypertension: Secondary | ICD-10-CM

## 2014-10-22 DIAGNOSIS — I5022 Chronic systolic (congestive) heart failure: Secondary | ICD-10-CM | POA: Diagnosis not present

## 2014-10-22 DIAGNOSIS — I255 Ischemic cardiomyopathy: Secondary | ICD-10-CM

## 2014-10-22 DIAGNOSIS — I454 Nonspecific intraventricular block: Secondary | ICD-10-CM

## 2014-10-22 NOTE — Patient Instructions (Addendum)
See instruction sheet for procedure  Your physician recommends that you schedule a follow-up appointment in: 7-10 days from 11/11/14 in device clinic for wound check

## 2014-10-22 NOTE — Progress Notes (Signed)
. PCP: Pearla Dubonnet, MD Primary Cardiologist:  Dr Anne Fu  Faline I Schreffler is a 81 y.o. female who presents today for routine electrophysiology followup. She reports doing well until her recent femur fracture 12/15.  She was standing on a stool working in her closet when she fell and fractured her femur.  She is recovering from surgery and has been in rehab. She has not had CHF symptoms recently.  Myoview 9/15 revealed EF 76%.  Recent device remote revealed ERI battery status.  She presents today for further evaluation.   Today, she denies symptoms of palpitations, chest pain,   lower extremity edema, dizziness, presyncope, syncope, or ICD shocks.  The patient is otherwise without complaint today.   Past Medical History  Diagnosis Date  . Ischemic cardiomyopathy     severe. Left ventricular ejection fraction 20%.   . Chronic systolic heart failure     NYHA class II.  [name] Chronic pulmonary disease   . BBB (bundle branch block)     s/p BiV ICD implant  . Raynaud's syndrome   . Neuromuscular scoliosis of thoracolumbar region     type of scoliosis was not specified.   [name] DJD (degenerative joint disease), cervical   . DJD (degenerative joint disease), lumbar   . HTN (hypertension)   . Hyperthyroidism     following Graves disease  . Renal artery stenosis     Treated with angioplast in 1980 and 1987.   . S/P CABG (coronary artery bypass graft) April 2012  . FH: mitral valve repair     with 26 mm Edwards ring angioplasty,   . COPD (chronic obstructive pulmonary disease)   . Full dentures   . PONV (postoperative nausea and vomiting)   . CHF (congestive heart failure)   . Dizziness   . Automatic implantable cardioverter-defibrillator in situ   . Pacemaker   . Asthma   . Pneumonia     hx  . GERD (gastroesophageal reflux disease)   . Anemia     takes iron 3 days per week  . Hx of cardiovascular stress test     Lexiscan Myoview (9/15):  Normal stress nuclear study.  LV Ejection  Fraction: 76%   Past Surgical History  Procedure Laterality Date  . Total abdominal hysterectomy    . Sympathectomy    . Tonsillectomy    . Renal artery ballon dilation    . Rotator cuff repair      right and left  . Laminotomy/foraminotomy      with decompression of the L4 nerve root   . Cervical fusion      C5-6 and C6-7, C4-5 with titanium plates  . Umbilical hernia repair    . Wedge resection  2001    for the right upper lobe for Aspergillus treatement.  Dr. Edwyna Shell apprix 2001.  [name] Ptca      of bilateral renal arteries  . Cataract extraction    . Carpal tunnel release    . Appendectomy    . Breast lumpectomy      left breast  . Renal artery ballon dilation      x2  . Precancerous growth      tops of ear removed. bilateral.   . Implantation of icd  2010    BiV ICD implant (SJM) by Dr Amil Amen 03/2009  . Repair extensor tendon  07/10/2012    Procedure: REPAIR EXTENSOR TENDON;  Surgeon: Nicki Reaper, MD;  Location: [facility];  Service: Orthopedics;  Laterality: Right;  METACARPAL PHALANGEAL REPLACEMENT ARTHROPLASTIES RIGHT INDEX, MIDDLE, AND RING FINGERS    . Finger arthroplasty  07/10/2012    Procedure: FINGER ARTHROPLASTY;  Surgeon: Nicki Reaper, MD;  Location: [facility];  Service: Orthopedics;  Laterality: Right;  METACARPAL PHALANGEAL ARTHROPLASTIES RIGHT INDEX, MIDDLE, AND RING FINGERS   . Esophageal manometry N/A 04/07/2013    Procedure: ESOPHAGEAL MANOMETRY (EM);  Surgeon: Charolett Bumpers, MD;  Location: [facility];  Service: Endoscopy;  Laterality: N/A;  . Carpometacarpel suspension plasty Right 11/12/2013    Procedure: SUSPENSION PLASTY RIGHT THUMB, TRAPEZIUM EXCISION;  Surgeon: Nicki Reaper, MD;  Location: [facility];  Service: Orthopedics;  Laterality: Right;  . Tendon transfer Right 11/12/2013    Procedure: RIGHT ABDUCTOR POLLICUS LONGUS TENDON TRANSFER;  Surgeon: Nicki Reaper, MD;  Location: [facility];   Service: Orthopedics;  Laterality: Right;  . Tonsillectomy    . Hemorrhoidectomy with hemorrhoid banding    . Facial cosmetic surgery    . Back surgery    . Eye surgery Bilateral     cataracts  . Hernia repair      umbilical  . Lung mass removal Right   . Left knee arthroscopic    . Coronary artery bypass graft  2010    mvr/cabg  . Total knee arthroplasty Left 04/29/2014    Procedure: LEFT TOTAL KNEE ARTHROPLASTY;  Surgeon: Nadara Mustard, MD;  Location: [facility];  Service: Orthopedics;  Laterality: Left;  . Femur im nail Left 09/29/2014    Procedure: Affixus Trochanteric Femoral Nail;  Surgeon: Eldred Manges, MD;  Location: [facility];  Service: Orthopedics;  Laterality: Left;    Current Outpatient Prescriptions  Medication Sig Dispense Refill  . acetaminophen (TYLENOL) 500 MG tablet Take 500 mg by mouth every 6 (six) hours as needed for mild pain or moderate pain.    [name] aspirin EC 81 MG tablet Take 1 tablet (81 mg total) by mouth daily. 90 tablet 3  . carvedilol (COREG) 12.5 MG tablet Take 1 tablet (12.5 mg total) by mouth 2 (two) times daily. 180 tablet 3  . Cholecalciferol (VITAMIN D3) 2000 UNITS capsule Take 2,000 Units by mouth daily.     . Coenzyme Q10 (COQ10) 100 MG CAPS Take 100 mg by mouth daily.     . feeding supplement, RESOURCE BREEZE, (RESOURCE BREEZE) LIQD Take 1 Container by mouth 3 (three) times daily between meals.  0  . levothyroxine (SYNTHROID, LEVOTHROID) 75 MCG tablet Take 75 mcg by mouth daily before breakfast.    . montelukast (SINGULAIR) 10 MG tablet Take 10 mg by mouth daily.     . pantoprazole (PROTONIX) 40 MG tablet Take 40 mg by mouth 2 (two) times daily.    . traMADol (ULTRAM) 50 MG tablet Take 1 tablet (50 mg total) by mouth every 6 (six) hours as needed. (Patient taking differently: Take 50 mg by mouth every 6 (six) hours as needed (pain). ) 20 tablet 0  . valsartan (DIOVAN) 80 MG tablet Take 40 mg by mouth daily.     No current facility-administered medications for  this visit.   ROS- all systems are reviewed and negative except as per HPI  History   Social History  . Marital Status: Married    Spouse Name: N/A    Number of Children: N/A  . Years of Education: N/A   Occupational History  . Not on file.   Social History Main Topics  .  Smoking status: Never Smoker   . Smokeless tobacco: Not on file     Comment: passive smoker from birth to age 63 (mom and husband)  . Alcohol Use: No  . Drug Use: Not on file  . Sexual Activity: Not on file   Other Topics Concern  . Not on file   Social History Narrative   Married and lives in [place].  Retired   Family History  Problem Relation Age of Onset  . Heart disease Father   . Hypertension Father   . Colon cancer      grandmother     Physical Exam: Filed Vitals:   10/22/14 1517  BP: 172/68  Pulse: 72  Height: 5' (1.524 m)  Weight: 112 lb 12.8 oz (51.166 kg)    GEN- The patient is well appearing, alert and oriented x 3 today.   Head- normocephalic, atraumatic Eyes-  Sclera clear, conjunctiva pink Ears- hearing intact Oropharynx- clear Lungs- Clear to ausculation bilaterally, normal work of breathing Chest- ICD pocket is well healed Heart- Regular rate and rhythm, no murmurs, rubs or gallops, PMI not laterally displaced GI- soft, NT, ND, + BS Extremities- no clubbing, cyanosis, or edema  ICD remote- reviewed in detail today,  See PACEART report   Epic records including recent hospitalization are reviewed  Assessment and Plan:  1. Chronic systolic dysfunction euvolemic today.  EF appears to have responded to CRT.  Will repeat echo Normal BiV ICD function except that she has reached ERI battery status.  Risks, benefits, and alternatives to ICD pulse generator replacement were discussed in detail today.  The patient understands that risks include but are not limited to bleeding, infection, pneumothorax, perforation, tamponade, vascular damage, renal failure, MI, stroke,  death, inappropriate shocks, damage to his existing leads, and lead dislodgement and wishes to proceed.  We will therefore schedule the procedure at the next available time.  I did discuss CRT-P as an alternative.  At this time she is clear that she would prefer CRT-D at time of the procedure.  2. HTN Stable No change required today  3. CAD No ischemic symptoms Repeat echo

## 2014-10-23 ENCOUNTER — Other Ambulatory Visit: Payer: Self-pay | Admitting: *Deleted

## 2014-10-23 DIAGNOSIS — I5022 Chronic systolic (congestive) heart failure: Secondary | ICD-10-CM

## 2014-10-23 DIAGNOSIS — Z95810 Presence of automatic (implantable) cardiac defibrillator: Secondary | ICD-10-CM

## 2014-10-23 DIAGNOSIS — I255 Ischemic cardiomyopathy: Secondary | ICD-10-CM

## 2014-10-23 DIAGNOSIS — I10 Essential (primary) hypertension: Secondary | ICD-10-CM

## 2014-10-23 DIAGNOSIS — I454 Nonspecific intraventricular block: Secondary | ICD-10-CM

## 2014-10-28 DIAGNOSIS — M6281 Muscle weakness (generalized): Secondary | ICD-10-CM | POA: Diagnosis not present

## 2014-10-28 DIAGNOSIS — S7222XD Displaced subtrochanteric fracture of left femur, subsequent encounter for closed fracture with routine healing: Secondary | ICD-10-CM | POA: Diagnosis not present

## 2014-10-28 DIAGNOSIS — R262 Difficulty in walking, not elsewhere classified: Secondary | ICD-10-CM | POA: Diagnosis not present

## 2014-10-30 ENCOUNTER — Telehealth: Payer: Self-pay | Admitting: Internal Medicine

## 2014-10-30 NOTE — Telephone Encounter (Signed)
New Message        Pt's husband calling stating that he needs more information about pt's procedure scheduled for 11/11/14. Please call back and advise.

## 2014-10-30 NOTE — Telephone Encounter (Signed)
CAlled and spoke with husband, he does not remember calling the office and requesting a call back.  He does have her instruction sheet and is aware of what time to be at the hospital and where to go

## 2014-11-02 ENCOUNTER — Ambulatory Visit (HOSPITAL_COMMUNITY): Payer: Medicare Other | Attending: Cardiovascular Disease

## 2014-11-02 ENCOUNTER — Other Ambulatory Visit (INDEPENDENT_AMBULATORY_CARE_PROVIDER_SITE_OTHER): Payer: Medicare Other | Admitting: *Deleted

## 2014-11-02 ENCOUNTER — Other Ambulatory Visit: Payer: Medicare Other

## 2014-11-02 DIAGNOSIS — M6281 Muscle weakness (generalized): Secondary | ICD-10-CM | POA: Diagnosis not present

## 2014-11-02 DIAGNOSIS — I10 Essential (primary) hypertension: Secondary | ICD-10-CM | POA: Diagnosis not present

## 2014-11-02 DIAGNOSIS — I5022 Chronic systolic (congestive) heart failure: Secondary | ICD-10-CM | POA: Diagnosis not present

## 2014-11-02 DIAGNOSIS — Z95810 Presence of automatic (implantable) cardiac defibrillator: Secondary | ICD-10-CM

## 2014-11-02 DIAGNOSIS — I454 Nonspecific intraventricular block: Secondary | ICD-10-CM

## 2014-11-02 DIAGNOSIS — I255 Ischemic cardiomyopathy: Secondary | ICD-10-CM

## 2014-11-02 DIAGNOSIS — R262 Difficulty in walking, not elsewhere classified: Secondary | ICD-10-CM | POA: Diagnosis not present

## 2014-11-02 DIAGNOSIS — S7222XD Displaced subtrochanteric fracture of left femur, subsequent encounter for closed fracture with routine healing: Secondary | ICD-10-CM | POA: Diagnosis not present

## 2014-11-02 LAB — BASIC METABOLIC PANEL
BUN: 16 mg/dL (ref 6–23)
CALCIUM: 9.1 mg/dL (ref 8.4–10.5)
CHLORIDE: 103 meq/L (ref 96–112)
CO2: 28 mEq/L (ref 19–32)
Creatinine, Ser: 0.99 mg/dL (ref 0.40–1.20)
GFR: 57.44 mL/min — ABNORMAL LOW (ref >60.00)
GLUCOSE: 111 mg/dL — AB (ref 70–99)
Potassium: 3.2 mEq/L — ABNORMAL LOW (ref 3.5–5.1)
Sodium: 137 mEq/L (ref 135–145)

## 2014-11-02 LAB — CBC WITH DIFFERENTIAL/PLATELET
BASOS ABS: 0 10*3/uL (ref 0.0–0.1)
BASOS PCT: 0.3 % (ref 0.0–3.0)
Eosinophils Absolute: 0.2 10*3/uL (ref 0.0–0.7)
Eosinophils Relative: 2.8 % (ref 0.0–5.0)
HEMATOCRIT: 37.9 % (ref 36.0–46.0)
Hemoglobin: 12.5 g/dL (ref 12.0–15.0)
LYMPHS ABS: 1.6 10*3/uL (ref 0.7–4.0)
Lymphocytes Relative: 22.1 % (ref 12.0–46.0)
MCHC: 32.9 g/dL (ref 30.0–36.0)
MCV: 89.4 fl (ref 78.0–100.0)
MONO ABS: 0.6 10*3/uL (ref 0.1–1.0)
Monocytes Relative: 8.3 % (ref 3.0–12.0)
Neutro Abs: 4.7 10*3/uL (ref 1.4–7.7)
Neutrophils Relative %: 66.5 % (ref 43.0–77.0)
PLATELETS: 232 10*3/uL (ref 150.0–400.0)
RBC: 4.24 Mil/uL (ref 3.87–5.11)
RDW: 15.3 % (ref 11.5–15.5)
WBC: 7.1 10*3/uL (ref 4.0–10.5)

## 2014-11-02 NOTE — Progress Notes (Signed)
2D Echo completed. 11/02/2014 

## 2014-11-03 ENCOUNTER — Other Ambulatory Visit: Payer: Self-pay | Admitting: *Deleted

## 2014-11-03 DIAGNOSIS — E876 Hypokalemia: Secondary | ICD-10-CM

## 2014-11-04 ENCOUNTER — Other Ambulatory Visit: Payer: Medicare Other

## 2014-11-04 DIAGNOSIS — M6281 Muscle weakness (generalized): Secondary | ICD-10-CM | POA: Diagnosis not present

## 2014-11-04 DIAGNOSIS — S7222XD Displaced subtrochanteric fracture of left femur, subsequent encounter for closed fracture with routine healing: Secondary | ICD-10-CM | POA: Diagnosis not present

## 2014-11-04 DIAGNOSIS — R262 Difficulty in walking, not elsewhere classified: Secondary | ICD-10-CM | POA: Diagnosis not present

## 2014-11-10 ENCOUNTER — Telehealth: Payer: Self-pay | Admitting: Internal Medicine

## 2014-11-10 ENCOUNTER — Telehealth: Payer: Self-pay | Admitting: *Deleted

## 2014-11-10 DIAGNOSIS — K219 Gastro-esophageal reflux disease without esophagitis: Secondary | ICD-10-CM | POA: Diagnosis not present

## 2014-11-10 DIAGNOSIS — E0590 Thyrotoxicosis, unspecified without thyrotoxic crisis or storm: Secondary | ICD-10-CM | POA: Diagnosis not present

## 2014-11-10 DIAGNOSIS — I5022 Chronic systolic (congestive) heart failure: Secondary | ICD-10-CM | POA: Diagnosis not present

## 2014-11-10 DIAGNOSIS — Z96652 Presence of left artificial knee joint: Secondary | ICD-10-CM | POA: Diagnosis not present

## 2014-11-10 DIAGNOSIS — I252 Old myocardial infarction: Secondary | ICD-10-CM | POA: Diagnosis not present

## 2014-11-10 DIAGNOSIS — I2510 Atherosclerotic heart disease of native coronary artery without angina pectoris: Secondary | ICD-10-CM | POA: Diagnosis not present

## 2014-11-10 DIAGNOSIS — I4891 Unspecified atrial fibrillation: Secondary | ICD-10-CM | POA: Diagnosis not present

## 2014-11-10 DIAGNOSIS — Z951 Presence of aortocoronary bypass graft: Secondary | ICD-10-CM | POA: Diagnosis not present

## 2014-11-10 DIAGNOSIS — I255 Ischemic cardiomyopathy: Secondary | ICD-10-CM | POA: Diagnosis not present

## 2014-11-10 DIAGNOSIS — I447 Left bundle-branch block, unspecified: Secondary | ICD-10-CM | POA: Diagnosis not present

## 2014-11-10 DIAGNOSIS — I10 Essential (primary) hypertension: Secondary | ICD-10-CM | POA: Diagnosis not present

## 2014-11-10 DIAGNOSIS — S7222XD Displaced subtrochanteric fracture of left femur, subsequent encounter for closed fracture with routine healing: Secondary | ICD-10-CM | POA: Diagnosis not present

## 2014-11-10 DIAGNOSIS — R262 Difficulty in walking, not elsewhere classified: Secondary | ICD-10-CM | POA: Diagnosis not present

## 2014-11-10 DIAGNOSIS — J45909 Unspecified asthma, uncomplicated: Secondary | ICD-10-CM | POA: Diagnosis not present

## 2014-11-10 DIAGNOSIS — M479 Spondylosis, unspecified: Secondary | ICD-10-CM | POA: Diagnosis not present

## 2014-11-10 DIAGNOSIS — J449 Chronic obstructive pulmonary disease, unspecified: Secondary | ICD-10-CM | POA: Diagnosis not present

## 2014-11-10 DIAGNOSIS — Z9861 Coronary angioplasty status: Secondary | ICD-10-CM | POA: Diagnosis not present

## 2014-11-10 DIAGNOSIS — Z4502 Encounter for adjustment and management of automatic implantable cardiac defibrillator: Secondary | ICD-10-CM | POA: Diagnosis not present

## 2014-11-10 DIAGNOSIS — Z7982 Long term (current) use of aspirin: Secondary | ICD-10-CM | POA: Diagnosis not present

## 2014-11-10 DIAGNOSIS — M6281 Muscle weakness (generalized): Secondary | ICD-10-CM | POA: Diagnosis not present

## 2014-11-10 MED ORDER — VANCOMYCIN HCL IN DEXTROSE 1-5 GM/200ML-% IV SOLN
1000.0000 mg | INTRAVENOUS | Status: AC
  Filled 2014-11-10: qty 200

## 2014-11-10 MED ORDER — SODIUM CHLORIDE 0.9 % IV SOLN
INTRAVENOUS | Status: DC
Start: 2014-11-10 — End: 2014-11-11
  Administered 2014-11-11: 11:00:00 via INTRAVENOUS

## 2014-11-10 MED ORDER — MUPIROCIN 2 % EX OINT
1.0000 "application " | TOPICAL_OINTMENT | Freq: Once | CUTANEOUS | Status: AC
  Administered 2014-11-11: 1 via TOPICAL
  Filled 2014-11-10: qty 22

## 2014-11-10 MED ORDER — SODIUM CHLORIDE 0.9 % IR SOLN
80.0000 mg | Status: AC
  Filled 2014-11-10: qty 2

## 2014-11-10 NOTE — Telephone Encounter (Signed)
pt notified about normal echo results with verbal understanding

## 2014-11-10 NOTE — Telephone Encounter (Signed)
Spoke with patient and she says her husband is handling and he has taken care of it

## 2014-11-10 NOTE — Telephone Encounter (Signed)
New Msg        Crystal from the Cath Lab calling, pt has lost instructions for procedure for tomorrow.   Please contact pt at [phone] and provide instructions as far as medicines and other precautions.

## 2014-11-11 ENCOUNTER — Encounter (HOSPITAL_COMMUNITY): Payer: Self-pay | Admitting: Internal Medicine

## 2014-11-11 ENCOUNTER — Ambulatory Visit (HOSPITAL_COMMUNITY)
Admission: RE | Admit: 2014-11-11 | Discharge: 2014-11-11 | Disposition: A | Discharge status: Home / Self Care | Payer: Medicare Other | Source: Ambulatory Visit | Attending: Internal Medicine | Admitting: Internal Medicine

## 2014-11-11 ENCOUNTER — Encounter (HOSPITAL_COMMUNITY)
Admission: RE | Disposition: A | Discharge status: Home / Self Care | Payer: Self-pay | Source: Ambulatory Visit | Attending: Internal Medicine

## 2014-11-11 DIAGNOSIS — I5022 Chronic systolic (congestive) heart failure: Secondary | ICD-10-CM | POA: Insufficient documentation

## 2014-11-11 DIAGNOSIS — I2510 Atherosclerotic heart disease of native coronary artery without angina pectoris: Secondary | ICD-10-CM | POA: Diagnosis not present

## 2014-11-11 DIAGNOSIS — J449 Chronic obstructive pulmonary disease, unspecified: Secondary | ICD-10-CM | POA: Insufficient documentation

## 2014-11-11 DIAGNOSIS — M479 Spondylosis, unspecified: Secondary | ICD-10-CM | POA: Diagnosis not present

## 2014-11-11 DIAGNOSIS — Z9861 Coronary angioplasty status: Secondary | ICD-10-CM | POA: Insufficient documentation

## 2014-11-11 DIAGNOSIS — Z96652 Presence of left artificial knee joint: Secondary | ICD-10-CM | POA: Insufficient documentation

## 2014-11-11 DIAGNOSIS — I255 Ischemic cardiomyopathy: Secondary | ICD-10-CM | POA: Diagnosis present

## 2014-11-11 DIAGNOSIS — I10 Essential (primary) hypertension: Secondary | ICD-10-CM | POA: Insufficient documentation

## 2014-11-11 DIAGNOSIS — Z4502 Encounter for adjustment and management of automatic implantable cardiac defibrillator: Secondary | ICD-10-CM | POA: Insufficient documentation

## 2014-11-11 DIAGNOSIS — I447 Left bundle-branch block, unspecified: Secondary | ICD-10-CM | POA: Diagnosis not present

## 2014-11-11 DIAGNOSIS — K219 Gastro-esophageal reflux disease without esophagitis: Secondary | ICD-10-CM | POA: Insufficient documentation

## 2014-11-11 DIAGNOSIS — I4891 Unspecified atrial fibrillation: Secondary | ICD-10-CM | POA: Insufficient documentation

## 2014-11-11 DIAGNOSIS — Z7982 Long term (current) use of aspirin: Secondary | ICD-10-CM | POA: Insufficient documentation

## 2014-11-11 DIAGNOSIS — Z95810 Presence of automatic (implantable) cardiac defibrillator: Secondary | ICD-10-CM

## 2014-11-11 DIAGNOSIS — I252 Old myocardial infarction: Secondary | ICD-10-CM | POA: Insufficient documentation

## 2014-11-11 DIAGNOSIS — E0590 Thyrotoxicosis, unspecified without thyrotoxic crisis or storm: Secondary | ICD-10-CM | POA: Insufficient documentation

## 2014-11-11 DIAGNOSIS — J45909 Unspecified asthma, uncomplicated: Secondary | ICD-10-CM | POA: Insufficient documentation

## 2014-11-11 DIAGNOSIS — I454 Nonspecific intraventricular block: Secondary | ICD-10-CM

## 2014-11-11 DIAGNOSIS — Z951 Presence of aortocoronary bypass graft: Secondary | ICD-10-CM

## 2014-11-11 HISTORY — PX: BIV ICD GENERTAOR CHANGE OUT: SHX5745

## 2014-11-11 LAB — POTASSIUM: POTASSIUM: 3.5 mmol/L (ref 3.5–5.1)

## 2014-11-11 LAB — SURGICAL PCR SCREEN
MRSA, PCR: POSITIVE — AB
STAPHYLOCOCCUS AUREUS: POSITIVE — AB

## 2014-11-11 SURGERY — BIV ICD GENERTAOR CHANGE OUT
Anesthesia: LOCAL

## 2014-11-11 MED ORDER — MUPIROCIN 2 % EX OINT
TOPICAL_OINTMENT | CUTANEOUS | Status: DC
Start: 2014-11-11 — End: 2014-11-11
  Filled 2014-11-11: qty 22

## 2014-11-11 MED ORDER — SODIUM CHLORIDE 0.9 % IJ SOLN: 3.0000 mL | Freq: Two times a day (BID) | INTRAMUSCULAR | Status: DC

## 2014-11-11 MED ORDER — HEPARIN (PORCINE) IN NACL 2-0.9 UNIT/ML-% IJ SOLN
INTRAMUSCULAR | Status: AC
  Filled 2014-11-11: qty 500

## 2014-11-11 MED ORDER — SODIUM CHLORIDE 0.9 % IV SOLN: 250.0000 mL | INTRAVENOUS | Status: DC | PRN

## 2014-11-11 MED ORDER — LIDOCAINE HCL (PF) 1 % IJ SOLN
INTRAMUSCULAR | Status: AC
  Filled 2014-11-11: qty 60

## 2014-11-11 MED ORDER — ACETAMINOPHEN 325 MG PO TABS
325.0000 mg | ORAL_TABLET | ORAL | Status: DC | PRN
  Filled 2014-11-11: qty 2

## 2014-11-11 MED ORDER — ONDANSETRON HCL 4 MG/2ML IJ SOLN: 4.0000 mg | Freq: Four times a day (QID) | INTRAMUSCULAR | Status: DC | PRN

## 2014-11-11 MED ORDER — SODIUM CHLORIDE 0.9 % IJ SOLN: 3.0000 mL | INTRAMUSCULAR | Status: DC | PRN

## 2014-11-11 MED ORDER — CHLORHEXIDINE GLUCONATE 4 % EX LIQD
60.0000 mL | Freq: Once | CUTANEOUS | Status: DC
  Filled 2014-11-11: qty 60

## 2014-11-11 MED ORDER — CARVEDILOL 12.5 MG PO TABS
12.5000 mg | ORAL_TABLET | Freq: Two times a day (BID) | ORAL | Status: DC
  Administered 2014-11-11: 12.5 mg via ORAL
  Filled 2014-11-11 (×2): qty 1

## 2014-11-11 NOTE — H&P (View-Only) (Signed)
. PCP: GATES,ROBERT NEVILL, MD Primary Cardiologist:  Dr Skains  [name] is a 81 y.o. female who presents today for routine electrophysiology followup. She reports doing well until her recent femur fracture 12/15.  She was standing on a stool working in her closet when she fell and fractured her femur.  She is recovering from surgery and has been in rehab. She has not had CHF symptoms recently.  Myoview 9/15 revealed EF 76%.  Recent device remote revealed ERI battery status.  She presents today for further evaluation.   Today, she denies symptoms of palpitations, chest pain,   lower extremity edema, dizziness, presyncope, syncope, or ICD shocks.  The patient is otherwise without complaint today.   Past Medical History  Diagnosis Date  . Ischemic cardiomyopathy     severe. Left ventricular ejection fraction 20%.   . Chronic systolic heart failure     NYHA class II.  . Chronic pulmonary disease   . BBB (bundle branch block)     s/p BiV ICD implant  . Raynaud's syndrome   . Neuromuscular scoliosis of thoracolumbar region     type of scoliosis was not specified.   . DJD (degenerative joint disease), cervical   . DJD (degenerative joint disease), lumbar   . HTN (hypertension)   . Hyperthyroidism     following Graves disease  . Renal artery stenosis     Treated with angioplast in 1980 and 1987.   . S/P CABG (coronary artery bypass graft) April 2012  . FH: mitral valve repair     with 26 mm Edwards ring angioplasty,   . COPD (chronic obstructive pulmonary disease)   . Full dentures   . PONV (postoperative nausea and vomiting)   . CHF (congestive heart failure)   . Dizziness   . Automatic implantable cardioverter-defibrillator in situ   . Pacemaker   . Asthma   . Pneumonia     hx  . GERD (gastroesophageal reflux disease)   . Anemia     takes iron 3 days per week  . Hx of cardiovascular stress test     Lexiscan Myoview (9/15):  Normal stress nuclear study.  LV Ejection  Fraction: 76%   Past Surgical History  Procedure Laterality Date  . Total abdominal hysterectomy    . Sympathectomy    . Tonsillectomy    . Renal artery ballon dilation    . Rotator cuff repair      right and left  . Laminotomy/foraminotomy      with decompression of the L4 nerve root   . Cervical fusion      C5-6 and C6-7, C4-5 with titanium plates  . Umbilical hernia repair    . Wedge resection  2001    for the right upper lobe for Aspergillus treatement.  Dr. Burney apprix 2001.  . Ptca      of bilateral renal arteries  . Cataract extraction    . Carpal tunnel release    . Appendectomy    . Breast lumpectomy      left breast  . Renal artery ballon dilation      x2  . Precancerous growth      tops of ear removed. bilateral.   . Implantation of icd  2010    BiV ICD implant (SJM) by Dr Edmunds 03/2009  . Repair extensor tendon  07/10/2012    Procedure: REPAIR EXTENSOR TENDON;  Surgeon: Gary R Kuzma, MD;  Location: [facility];  Service: Orthopedics;    Laterality: Right;  METACARPAL PHALANGEAL REPLACEMENT ARTHROPLASTIES RIGHT INDEX, MIDDLE, AND RING FINGERS    . Finger arthroplasty  07/10/2012    Procedure: FINGER ARTHROPLASTY;  Surgeon: Gary R Kuzma, MD;  Location: [facility];  Service: Orthopedics;  Laterality: Right;  METACARPAL PHALANGEAL ARTHROPLASTIES RIGHT INDEX, MIDDLE, AND RING FINGERS   . Esophageal manometry N/A 04/07/2013    Procedure: ESOPHAGEAL MANOMETRY (EM);  Surgeon: Martin K Johnson, MD;  Location[facility];  Service: Endoscopy;  Laterality: N/A;  . Carpometacarpel suspension plasty Right 11/12/2013    Procedure: SUSPENSION PLASTY RIGHT THUMB, TRAPEZIUM EXCISION;  Surgeon: Gary R Kuzma, MD;  Location: [facility];  Service: Orthopedics;  Laterality: Right;  . Tendon transfer Right 11/12/2013    Procedure: RIGHT ABDUCTOR POLLICUS LONGUS TENDON TRANSFER;  Surgeon: Gary R Kuzma, MD;  Location: [facility];   Service: Orthopedics;  Laterality: Right;  . Tonsillectomy    . Hemorrhoidectomy with hemorrhoid banding    . Facial cosmetic surgery    . Back surgery    . Eye surgery Bilateral     cataracts  . Hernia repair      umbilical  . Lung mass removal Right   . Left knee arthroscopic    . Coronary artery bypass graft  2010    mvr/cabg  . Total knee arthroplasty Left 04/29/2014    Procedure: LEFT TOTAL KNEE ARTHROPLASTY;  Surgeon: Marcus Duda V, MD;  Location: [facility];  Service: Orthopedics;  Laterality: Left;  . Femur im nail Left 09/29/2014    Procedure: Affixus Trochanteric Femoral Nail;  Surgeon: Mark C Yates, MD;  Location: [facility];  Service: Orthopedics;  Laterality: Left;    Current Outpatient Prescriptions  Medication Sig Dispense Refill  . acetaminophen (TYLENOL) 500 MG tablet Take 500 mg by mouth every 6 (six) hours as needed for mild pain or moderate pain.    . aspirin EC 81 MG tablet Take 1 tablet (81 mg total) by mouth daily. 90 tablet 3  . carvedilol (COREG) 12.5 MG tablet Take 1 tablet (12.5 mg total) by mouth 2 (two) times daily. 180 tablet 3  . Cholecalciferol (VITAMIN D3) 2000 UNITS capsule Take 2,000 Units by mouth daily.     . Coenzyme Q10 (COQ10) 100 MG CAPS Take 100 mg by mouth daily.     . feeding supplement, RESOURCE BREEZE, (RESOURCE BREEZE) LIQD Take 1 Container by mouth 3 (three) times daily between meals.  0  . levothyroxine (SYNTHROID, LEVOTHROID) 75 MCG tablet Take 75 mcg by mouth daily before breakfast.    . montelukast (SINGULAIR) 10 MG tablet Take 10 mg by mouth daily.     . pantoprazole (PROTONIX) 40 MG tablet Take 40 mg by mouth 2 (two) times daily.    . traMADol (ULTRAM) 50 MG tablet Take 1 tablet (50 mg total) by mouth every 6 (six) hours as needed. (Patient taking differently: Take 50 mg by mouth every 6 (six) hours as needed (pain). ) 20 tablet 0  . valsartan (DIOVAN) 80 MG tablet Take 40 mg by mouth daily.     No current facility-administered medications for  this visit.   ROS- all systems are reviewed and negative except as per HPI  History   Social History  . Marital Status: Married    Spouse Name: N/A    Number of Children: N/A  . Years of Education: N/A   Occupational History  . Not on file.   Social History Main Topics  .   Smoking status: Never Smoker   . Smokeless tobacco: Not on file     Comment: passive smoker from birth to age 61 (mom and husband)  . Alcohol Use: No  . Drug Use: Not on file  . Sexual Activity: Not on file   Other Topics Concern  . Not on file   Social History Narrative   Married and lives in [place].  Retired   Family History  Problem Relation Age of Onset  . Heart disease Father   . Hypertension Father   . Colon cancer      grandmother     Physical Exam: Filed Vitals:   10/22/14 1517  BP: 172/68  Pulse: 72  Height: 5' (1.524 m)  Weight: 112 lb 12.8 oz (51.166 kg)    GEN- The patient is well appearing, alert and oriented x 3 today.   Head- normocephalic, atraumatic Eyes-  Sclera clear, conjunctiva pink Ears- hearing intact Oropharynx- clear Lungs- Clear to ausculation bilaterally, normal work of breathing Chest- ICD pocket is well healed Heart- Regular rate and rhythm, no murmurs, rubs or gallops, PMI not laterally displaced GI- soft, NT, ND, + BS Extremities- no clubbing, cyanosis, or edema  ICD remote- reviewed in detail today,  See PACEART report   Epic records including recent hospitalization are reviewed  Assessment and Plan:  1. Chronic systolic dysfunction euvolemic today.  EF appears to have responded to CRT.  Will repeat echo Normal BiV ICD function except that she has reached ERI battery status.  Risks, benefits, and alternatives to ICD pulse generator replacement were discussed in detail today.  The patient understands that risks include but are not limited to bleeding, infection, pneumothorax, perforation, tamponade, vascular damage, renal failure, MI, stroke,  death, inappropriate shocks, damage to his existing leads, and lead dislodgement and wishes to proceed.  We will therefore schedule the procedure at the next available time.  I did discuss CRT-P as an alternative.  At this time she is clear that she would prefer CRT-D at time of the procedure.  2. HTN Stable No change required today  3. CAD No ischemic symptoms Repeat echo  

## 2014-11-11 NOTE — Discharge Instructions (Signed)
Pacemaker Battery Change, Care After Refer to this sheet in the next few weeks. These instructions provide you with information on caring for yourself after your procedure. Your health care provider may also give you more specific instructions. Your treatment has been planned according to current medical practices, but problems sometimes occur. Call your health care provider if you have any problems or questions after your procedure. WHAT TO EXPECT AFTER THE PROCEDURE After your procedure, it is typical to have the following sensations:  Soreness at the pacemaker site. HOME CARE INSTRUCTIONS   Keep the incision clean and dry. MAY REMOVE OUTER DRESSING Friday AFTERNOON; DO NOT REMOVE STERI-STRIPS; DO NOT GET SITE WET FOR 7 DAYS  For the first week after the replacement, avoid stretching motions that pull at the incision site, and avoid heavy exercise with the arm that is on the same side as the incision.  Take medicines only as directed by your health care provider.  Keep all follow-up visits as directed by your health care provider. SEEK MEDICAL CARE IF:   You have pain at the incision site that is not relieved by over-the-counter or prescription medicine.  There is drainage or pus from the incision site.  There is swelling larger than a lime at the incision site.  You develop red streaking that extends above or below the incision site.  You feel brief, intermittent palpitations, light-headedness, or any symptoms that you feel might be related to your heart. SEEK IMMEDIATE MEDICAL CARE IF:   You experience chest pain that is different than the pain at the pacemaker site.  You experience shortness of breath.  You have palpitations or irregular heartbeat.  You have light-headedness that does not go away quickly.  You faint.  You have pain that gets worse and is not relieved by medicine. Document Released: 07/02/2013 Document Revised: 01/26/2014 Document Reviewed:  07/02/2013 [facility] Patient Information 2015 [place], [place]. This information is not intended to replace advice given to you by your health care provider. Make sure you discuss any questions you have with your health care provider.

## 2014-11-11 NOTE — Interval H&P Note (Signed)
History and Physical Interval Note:  ICD Criteria  Current LVEF:70% ;Obtained > 3 months ago and < or = 6 months ago.   NYHA Functional Classification: Class II  Heart Failure History:  Yes, Duration of heart failure since onset is > 9 months  Ischemic CM  Atrial Fibrillation/Atrial Flutter:  No.  Ventricular Tachycardia History:  No.  Cardiac Arrest History:  No  History of Syndromes with Risk of Sudden Death:  No.  Previous ICD:  Yes, ICD Type:  CRT-D, Reason for ICD:  Primary prevention.  20%  Electrophysiology Study: No.  Prior MI: Yes, Most recent MI timeframe is > 40 days.  PPM: No.  OSA:  No  Patient Life Expectancy of >=1 year: Yes.  Anticoagulation Therapy:  Patient is NOT on anticoagulation therapy.   Beta Blocker Therapy:  Yes.   Ace Inhibitor/ARB Therapy:  Yes.   11/11/2014 1:06 PM  [name]  has presented today for surgery, with the diagnosis of ERI  The various methods of treatment have been discussed with the patient and family. After consideration of risks, benefits and other options for treatment, the patient has consented to  Procedure(s): BIV ICD GENERTAOR CHANGE OUT (N/A) as a surgical intervention .  The patient's history has been reviewed, patient examined, no change in status, stable for surgery.  I have reviewed the patient's chart and labs.  Questions were answered to the patient's satisfaction.     Hillis Range

## 2014-11-11 NOTE — Op Note (Signed)
SURGEON:  Hillis Range, MD      PREPROCEDURE DIAGNOSES:   1. Ischemic cardiomyopathy.   2. New York Heart Association class II, heart failure chronically.   3. CAD  4. LBBB     POSTPROCEDURE DIAGNOSES:   1. Ischemic cardiomyopathy.   2. New York Heart Association class II, heart failure chronically.   3. CAD  4. LBBB   PROCEDURES:    1. ICD lead evaluation under fluoroscopy.  2. BiV ICD pulse generator replacement   3. Skin pocket revision     INTRODUCTION: [name] is a 81 y.o. female with an ischemic CM (EF 20%--> 70% with CRT), NYHA Class II CHF, and CAD s/p BiV ICD with good response to CRT who presents today for BiV ICD pulse generator replacement for ERI battery status.  She has a SJM 1158 LV lead which is under surveillance due to reports of wire extrusion in some patients. The patient therefore  presents today for lead fluoroscopy with BiV ICD pulse generator replacement.      DESCRIPTION OF PROCEDURE:  Informed written consent was obtained and the patient was brought to the electrophysiology lab in the fasting state.  Her previously implanted SJM 1158 LV lead was thoroughly evaluated with Cine in the RAO, AP, and LAO positions (15 minutes required).  There was no evidence for wire extrusion from the lead.  The device was interrogated and confirmed to be at [facility] battery status.  Lead measurements were all satisfactory and stable.  I therefore elected to not replace the LV lead today.  The patient required no sedation for the procedure today.  The patient's left chest was prepped and draped in the usual sterile fashion by the EP lab staff.  The skin overlying the left deltopectoral region was infiltrated with lidocaine for local analgesia.  A 5-cm incision was made over the existing ICD pocket.  Electrocautery was used to assure hemostasis.  The device was exposed and removed from the pocket. A single silk stitch was identified and removed which previously secured the device within  the pocket. The device was disconnected from the leads.  The leads were examined thoroughly and their integrity confirmed to be intact.   The right atrial lead was confirmed to be a Designer, jewellery model [phone]  (serial # [id number]) lead implanted 03/31/2009.  The right ventricular lead was confirmed to be a [employer]er, jewellery model [id number] (serial number [id number]) right ventricular defibrillator lead also implanted 10/11/05. The left ventricular lead was confirmed to be a [employer]er, jewellery model [phone] (serial number [id number]) CS lead also implanted 10/11/05. Atrial lead P-waves measured 2.0 mV with an impedance of 363 ohms and a threshold of 0.6 volts at 0.5 milliseconds.  The right ventricular lead R-wave measured 11.3 mV with impedance of 406 ohms and a threshold of 1.5 volts at 0.5 milliseconds.  Left ventricular lead R waves (bipolar) were 16.5 mV with an impedance of 492 Ohms and a bipolar threshold of 1.2V @ 0.5 msec.  All three leads were then connected to a [facility] model [phone]) ICD. The pocket was revised to accomodate this new device.  The device was placed more medially due to patient concerns that the device was uncomfortable in its previous lateral position.  The pocket was  irrigated with copious gentamicin solution.  The defibrillator was placed into the  Pocket and secured to the pectoralis fascia using #2 silk suture.  The pocket was then  closed in 2 layers with 2.0 Vicryl suture  for the subcutaneous and subcuticular layers. EBL<50ml.  Steri-Strips and a  sterile dressing were then applied.   Defibrillation Threshold testing was not performed.  There were no early apparent complications.     CONCLUSIONS:   1. Ischemic cardiomyopathy with chronic New York Heart Association class II heart failure, CAD, and good response to CRT  2. BiV ICD at elective replacement indicator  3. Successful BiV ICD pulse generator replacement   4. No early apparent  complications.   Hillis Range MD 11/11/2014 2:11 PM

## 2014-11-12 ENCOUNTER — Telehealth: Payer: Self-pay | Admitting: Internal Medicine

## 2014-11-12 ENCOUNTER — Encounter: Payer: Self-pay | Admitting: Physician Assistant

## 2014-11-12 ENCOUNTER — Ambulatory Visit (INDEPENDENT_AMBULATORY_CARE_PROVIDER_SITE_OTHER): Payer: Medicare Other | Admitting: Physician Assistant

## 2014-11-12 VITALS — BP 162/60 | HR 63 | Ht 60.0 in | Wt 111.2 lb

## 2014-11-12 DIAGNOSIS — H5789 Other specified disorders of eye and adnexa: Secondary | ICD-10-CM | POA: Insufficient documentation

## 2014-11-12 DIAGNOSIS — I255 Ischemic cardiomyopathy: Secondary | ICD-10-CM

## 2014-11-12 DIAGNOSIS — H578 Other specified disorders of eye and adnexa: Secondary | ICD-10-CM

## 2014-11-12 DIAGNOSIS — I10 Essential (primary) hypertension: Secondary | ICD-10-CM

## 2014-11-12 DIAGNOSIS — R21 Rash and other nonspecific skin eruption: Secondary | ICD-10-CM | POA: Diagnosis not present

## 2014-11-12 MED ORDER — DIPHENHYDRAMINE HCL 25 MG PO CAPS: 25.0000 mg | ORAL_CAPSULE | Freq: Four times a day (QID) | ORAL | Status: DC | PRN

## 2014-11-12 MED ORDER — VALSARTAN 80 MG PO TABS: 80.0000 mg | ORAL_TABLET | Freq: Every day | ORAL | Status: DC

## 2014-11-12 NOTE — Telephone Encounter (Signed)
Follow up     Patient husband calling stating he need to heard something back within an hour -  Or he will be coming over to the office.

## 2014-11-12 NOTE — Assessment & Plan Note (Signed)
This is such a discrete lesion(2 cm) it makes me think that she's been bit by an insect of some type.  She will continue to monitor.

## 2014-11-12 NOTE — Progress Notes (Signed)
Patient ID: [name], female   DOB: 09-25-1933, 81 y.o.   MRN: [id number]    Date:  11/12/2014   ID:  [name], DOB 1932-10-25, MRN [id number]  PCP:  Pearla Dubonnet, MD  Primary Cardiologist:  Allred  No chief complaint on file.    History of Present Illness: [name] is a 81 y.o. female with a past medical history below and recent femur fracture 08/2014. She was standing on a stool working in her closet when she fell and fractured her femur. She is recovering from surgery and has been in rehab. She has not had CHF symptoms recently. Myoview 9/15 revealed EF 76%. Recent device remote revealed ERI battery status.  The patient presents with mild edema and erythema in the left eye which was first noticed while in the hospitalized for her generator change out.  It has become pruritic and she's also noticed erythema and pruritus in the right which developed later.  She reports mild blurry vision however, the edema has improved since this morning.  Both eyes are nontender. There is no discomfort with movement. No discharge of clear nor purulent fluids. She denies headache, nausea, vomiting, fever/chills.    Echocardiogram Study Conclusions, 11/02/14  - Left ventricle: The cavity size was normal. Wall thickness was normal. Systolic function was vigorous. The estimated ejection fraction was in the range of 75% to 80%. Wall motion was normal; there were no regional wall motion abnormalities. - Aortic valve: Mildly to moderately calcified annulus. - Mitral valve: Prior procedures included surgical repair. The findings are consistent with moderate stenosis. Valve area by pressure half-time: 1.22 cm^2. Valve area by continuity equation (using LVOT flow): 0.74 cm^2.  Wt Readings from Last 3 Encounters:  11/12/14 111 lb 3.2 oz (50.44 kg)  10/22/14 112 lb 12.8 oz (51.166 kg)  10/02/14 112 lb 14 oz (51.2 kg)     Past Medical History  Diagnosis Date  . Ischemic  cardiomyopathy     severe. Left ventricular ejection fraction 20%.   . Chronic systolic heart failure     NYHA class II.  [name] Chronic pulmonary disease   . BBB (bundle branch block)     s/p BiV ICD implant  . Raynaud's syndrome   . Neuromuscular scoliosis of thoracolumbar region     type of scoliosis was not specified.   [name] DJD (degenerative joint disease), cervical   . DJD (degenerative joint disease), lumbar   . HTN (hypertension)   . Hyperthyroidism     following Graves disease  . Renal artery stenosis     Treated with angioplast in 1980 and 1987.   . S/P CABG (coronary artery bypass graft) April 2012  . FH: mitral valve repair     with 26 mm Edwards ring angioplasty,   . COPD (chronic obstructive pulmonary disease)   . Full dentures   . PONV (postoperative nausea and vomiting)   . CHF (congestive heart failure)   . Dizziness   . Automatic implantable cardioverter-defibrillator in situ   . Pacemaker   . Asthma   . Pneumonia     hx  . GERD (gastroesophageal reflux disease)   . Anemia     takes iron 3 days per week  . Hx of cardiovascular stress test     Lexiscan Myoview (9/15):  Normal stress nuclear study.  LV Ejection Fraction: 76%    Current Outpatient Prescriptions  Medication Sig Dispense Refill  . acetaminophen (TYLENOL) 500 MG tablet Take 500 mg  by mouth every 6 (six) hours as needed for mild pain or moderate pain.    . carvedilol (COREG) 12.5 MG tablet Take 1 tablet (12.5 mg total) by mouth 2 (two) times daily. 180 tablet 3  . Cholecalciferol (VITAMIN D3) 2000 UNITS capsule Take 2,000 Units by mouth daily.     . Coenzyme Q10 (COQ10) 100 MG CAPS Take 100 mg by mouth daily.     . feeding supplement, RESOURCE BREEZE, (RESOURCE BREEZE) LIQD Take 1 Container by mouth 3 (three) times daily between meals.  0  . fluconazole (DIFLUCAN) 100 MG tablet Take 100 mg by mouth once a week.    . levothyroxine (SYNTHROID, LEVOTHROID) 75 MCG tablet Take 75 mcg by mouth daily before  breakfast.    . montelukast (SINGULAIR) 10 MG tablet Take 10 mg by mouth daily.     . pantoprazole (PROTONIX) 40 MG tablet Take 40 mg by mouth 2 (two) times daily.    . potassium chloride SA (K-DUR,KLOR-CON) 20 MEQ tablet Take 1 tablet (20 mEq total) by mouth daily. 30 tablet 0  . traMADol (ULTRAM) 50 MG tablet Take 1 tablet (50 mg total) by mouth every 6 (six) hours as needed. 20 tablet 0  . valsartan (DIOVAN) 80 MG tablet Take 1 tablet (80 mg total) by mouth daily. 30 tablet 5  . aspirin EC 81 MG tablet Take 1 tablet (81 mg total) by mouth daily. (Patient not taking: Reported on 11/12/2014) 90 tablet 3  . diphenhydrAMINE (BENADRYL) 25 mg capsule Take 1 capsule (25 mg total) by mouth every 6 (six) hours as needed. 30 capsule 1   No current facility-administered medications for this visit.    Allergies:    Allergies  Allergen Reactions  . Alprazolam Other (See Comments)    REACTION: ulcer's in mouth and extreme constipation  . Doxycycline Other (See Comments)    REACTION: severe rash over entire body  . Fulvicin P-G [Griseofulvin] Other (See Comments)    Severe headaches  . Hydrocodone Other (See Comments)    REACTION: nausea and totally out of it  . Ketoconazole Other (See Comments)    REACTION: terribly weak, voice shook  . Serevent [Salmeterol] Other (See Comments)    shaking  . Calcitonin (Salmon) Other (See Comments)    REACTION: rash over entire body  . Morphine And Related Nausea And Vomiting  . Amitriptyline     unknown  . Cefuroxime Axetil Other (See Comments)    REACTION: either rash or diarrhea  . Hydrocodone-Acetaminophen Other (See Comments)    REACTION: reaction forgotten. Lorcet, percodan  . Other     Ketacosol= terribly week  . Sulfamethoxazole-Trimethoprim Other (See Comments)    REACTION: either rash or diarrhea  . Sulfonamide Derivatives Other (See Comments)    REACTION: reaction forgotten  . Trovan [Alatrofloxacin] Nausea And Vomiting  . Aspirin Other  (See Comments)    REACTION: upsets stomach  . Cephalexin Rash  . Ciprofloxacin Rash  . Clonazepam Other (See Comments)    REACTION: 1/2 pill makes grogginess next day  . Erythromycin Rash  . Hydromorphone Rash  . Oxycodone-Aspirin Other (See Comments)    REACTION: nausea  . Penicillins Rash  . Tapazole [Thiamazole] Rash    Social History:  The patient  reports that she has never smoked. She does not have any smokeless tobacco history on file. She reports that she does not drink alcohol.   Family history:   Family History  Problem Relation Age of Onset  .  Heart disease Father   . Hypertension Father   . Colon cancer      grandmother    ROS:  Please see the history of present illness.  All other systems reviewed and negative.   PHYSICAL EXAM: VS:  BP 162/60 mmHg  Pulse 63  Ht 5' (1.524 m)  Wt 111 lb 3.2 oz (50.44 kg)  BMI 21.72 kg/m2 Well nourished, well developed, in no acute distress HEENT: Pupils are equal round react to light accommodation extraocular movements are intact.  Diffuse mild erythema around the orbits of both eyes may be a little worse on the left. His edema of both eyelids. Eyelids are nontender there is no proptosis there is no discharge from either eye. No discrete lesions noted. There is a small patch of erythema above her left temple in her hairline. The somewhat as well as nontender.  Peripheral acuity is intact. There is mild blurriness in the left eye.  Funduscopic exam was unremarkable, albeit not very detailed.  Conjunctiva are not injected. Neck: no JVDNo cervical lymphadenopathy. Cardiac: Regular rate and rhythm without murmurs rubs or gallops. Lungs:  clear to auscultation bilaterally, no wheezing, rhonchi or rales Ext: no lower extremity edema.  2+ radial and dorsalis pedis pulses. Skin: There is a discrete 2 cm annular, macular, erythematous lesion/rash mid right thigh. This area is nontender as well.  The incision site for the pacer is nontender  with no obvious edema. It is still bandaged. Neuro:  Grossly normal      ASSESSMENT AND PLAN:  Problem List Items Addressed This Visit    Rash, right thigh    This is such a discrete lesion(2 cm) it makes me think that she's been bit by an insect of some type.  She will continue to monitor.      Eye swollen, left    The patient mild edema and erythema in the left eye which was first noticed while hospitalized for her generator change out.  It has become pruritic and she's also noticed erythema and pruritus in the right.  I.  s been some mild blurry vision however, the edema has improved since this morning.  Both eyes are nontender. There is no discomfort with movement is no discharge of clear normal purulent the eyelids are nontender and there is conjunctiva is not injected.  This may be a mild case of orbital cellulitis, viral conjunctivitis, or some type of allergic reaction. Recommended systemic antihistamine Benadryl as needed for the itchiness.  I recommended if she has any change in symptoms for the worse that she should go to urgent care or the emergency room.        Essential hypertension - Primary    Poorly controlled.  Increase Diovan to 80 mg daily.      Relevant Medications   valsartan (DIOVAN) tablet

## 2014-11-12 NOTE — Telephone Encounter (Signed)
Not sure what we are to do.  Dr Johney Frame is off today and I will try and get in touch with him but rather than they come to the office as a walk in I will have then placed on PA schedule

## 2014-11-12 NOTE — Telephone Encounter (Signed)
Pt states yesterday before she left the hospital Dr Johney Frame commented that her left eye was swollen.  Pt states it is about the same as yesterday, swollen, red, no drainage, her vision is just not clear is certain places.  Pt also states this morning she noticed a quarter size red spot on the front of her right thigh, closer to her groin, not raised, no drainage.  Pt states she has not noticed any other red spots anywhere else on her body, she denies generalized itchiness.   Pt advised I will forward to Dr Johney Frame for review and recommendations.

## 2014-11-12 NOTE — Assessment & Plan Note (Addendum)
The patient mild edema and erythema in the left eye which was first noticed while hospitalized for her generator change out.  It has become pruritic and she's also noticed erythema and pruritus in the right.  I.  s been some mild blurry vision however, the edema has improved since this morning.  Both eyes are nontender. There is no discomfort with movement is no discharge of clear normal purulent the eyelids are nontender and there is conjunctiva is not injected.  This may be a mild case of orbital cellulitis, viral conjunctivitis, or some type of allergic reaction. Recommended systemic antihistamine Benadryl as needed for the itchiness.  I recommended if she has any change in symptoms for the worse that she should go to urgent care or the emergency room.

## 2014-11-12 NOTE — Telephone Encounter (Signed)
New Msg        Pt Husband calling, pt had procedure yesterday with Dr. Johney Frame and has awaken with swollen eyes and a red spot the size of a quarter in the upper part of her leg.  Spot on pt leg is warm to the touch.   Please return call.

## 2014-11-12 NOTE — Patient Instructions (Addendum)
Your physician has recommended you make the following change in your medication:     START DIOVAN 80 MG ONCE A DAY  RX SENT INTO PHARMACY   START TAKING BENADRYL 25 MG AS NEEDED FOR ITCHING    RX SENT INTO PHARMACY    ANY WORSENING OF VISION WITH DISCHARGE FROM EYE REPORT TO ER

## 2014-11-12 NOTE — Assessment & Plan Note (Signed)
Poorly controlled.  Increase Diovan to 80 mg daily.

## 2014-11-13 ENCOUNTER — Telehealth: Payer: Self-pay | Admitting: *Deleted

## 2014-11-13 DIAGNOSIS — H119 Unspecified disorder of conjunctiva: Secondary | ICD-10-CM | POA: Diagnosis not present

## 2014-11-13 NOTE — Telephone Encounter (Signed)
Spoke with patient about her eye (saw Wilburt Finlay, [place] yesterday).  She states in the mornings, her eye is matted shut but this resolves throughout the day.  She saw Dr Kevan Ny' PA today who did not think any additional treatment was necessary.  Discussed with Dr Johney Frame, advised to use warm compresses and good hand hygiene.  Advised patient if not resolved by Monday to follow up with PCP.  Pt aware and agrees with plan.   [employer] 11/13/2014 1:36 PM

## 2014-11-17 ENCOUNTER — Other Ambulatory Visit: Payer: Self-pay

## 2014-11-17 DIAGNOSIS — R262 Difficulty in walking, not elsewhere classified: Secondary | ICD-10-CM | POA: Diagnosis not present

## 2014-11-17 DIAGNOSIS — S7222XD Displaced subtrochanteric fracture of left femur, subsequent encounter for closed fracture with routine healing: Secondary | ICD-10-CM | POA: Diagnosis not present

## 2014-11-17 DIAGNOSIS — M6281 Muscle weakness (generalized): Secondary | ICD-10-CM | POA: Diagnosis not present

## 2014-11-17 MED ORDER — VALSARTAN 80 MG PO TABS: 80.0000 mg | ORAL_TABLET | Freq: Every day | ORAL | Status: DC

## 2014-11-17 NOTE — Telephone Encounter (Signed)
Rx(s) sent to pharmacy electronically.  

## 2014-11-20 DIAGNOSIS — S72332D Displaced oblique fracture of shaft of left femur, subsequent encounter for closed fracture with routine healing: Secondary | ICD-10-CM | POA: Diagnosis not present

## 2014-11-23 ENCOUNTER — Ambulatory Visit (INDEPENDENT_AMBULATORY_CARE_PROVIDER_SITE_OTHER): Payer: Medicare Other | Admitting: *Deleted

## 2014-11-23 DIAGNOSIS — I255 Ischemic cardiomyopathy: Secondary | ICD-10-CM

## 2014-11-23 LAB — MDC_IDC_ENUM_SESS_TYPE_INCLINIC
Battery Remaining Longevity: 76.8 mo
Brady Statistic RA Percent Paced: 23 %
Date Time Interrogation Session: 20160229121137
HIGH POWER IMPEDANCE MEASURED VALUE: 49 Ohm
HIGH POWER IMPEDANCE MEASURED VALUE: 49.4371
Implantable Pulse Generator Serial Number: 7226908
Lead Channel Impedance Value: 412.5 Ohm
Lead Channel Impedance Value: 412.5 Ohm
Lead Channel Impedance Value: 562.5 Ohm
Lead Channel Pacing Threshold Amplitude: 0.75 V
Lead Channel Pacing Threshold Amplitude: 1 V
Lead Channel Pacing Threshold Amplitude: 1 V
Lead Channel Pacing Threshold Amplitude: 1 V
Lead Channel Pacing Threshold Pulse Width: 0.5 ms
Lead Channel Pacing Threshold Pulse Width: 0.5 ms
Lead Channel Pacing Threshold Pulse Width: 0.6 ms
Lead Channel Pacing Threshold Pulse Width: 0.6 ms
Lead Channel Sensing Intrinsic Amplitude: 12 mV
Lead Channel Setting Pacing Amplitude: 2.5 V
Lead Channel Setting Pacing Pulse Width: 0.6 ms
Lead Channel Setting Sensing Sensitivity: 0.5 mV
MDC IDC MSMT LEADCHNL LV PACING THRESHOLD PULSEWIDTH: 0.5 ms
MDC IDC MSMT LEADCHNL RA PACING THRESHOLD AMPLITUDE: 0.75 V
MDC IDC MSMT LEADCHNL RA PACING THRESHOLD PULSEWIDTH: 0.5 ms
MDC IDC MSMT LEADCHNL RA SENSING INTR AMPL: 1.8 mV
MDC IDC MSMT LEADCHNL RV PACING THRESHOLD AMPLITUDE: 1 V
MDC IDC SET LEADCHNL LV PACING AMPLITUDE: 2 V
MDC IDC SET LEADCHNL LV PACING PULSEWIDTH: 0.5 ms
MDC IDC SET LEADCHNL RA PACING AMPLITUDE: 2 V
MDC IDC STAT BRADY RV PERCENT PACED: 94 %
Zone Setting Detection Interval: 300 ms
Zone Setting Detection Interval: 400 ms

## 2014-11-23 NOTE — Progress Notes (Signed)

## 2014-11-24 ENCOUNTER — Telehealth: Payer: Self-pay | Admitting: Internal Medicine

## 2014-11-24 NOTE — Telephone Encounter (Signed)
New message      Pt recently had a new device put in.  She needs a new ID card with the new model number, etc.  Call pt when ready and he will pick it up.

## 2014-11-24 NOTE — Telephone Encounter (Signed)
N/A---no answer machine. Will try and contact pt later time.

## 2014-11-25 NOTE — Telephone Encounter (Signed)
Spoke w/pt to let know new ID card would be mailed. Pt instructed to call back if new ID card was not received by beginning of April//kwm

## 2014-12-11 ENCOUNTER — Encounter: Payer: Self-pay | Admitting: Internal Medicine

## 2014-12-15 ENCOUNTER — Encounter: Payer: Self-pay | Admitting: Cardiology

## 2014-12-15 ENCOUNTER — Ambulatory Visit (INDEPENDENT_AMBULATORY_CARE_PROVIDER_SITE_OTHER): Payer: Medicare Other | Admitting: Cardiology

## 2014-12-15 VITALS — BP 116/50 | HR 69 | Ht 60.0 in | Wt 107.0 lb

## 2014-12-15 DIAGNOSIS — Z9889 Other specified postprocedural states: Secondary | ICD-10-CM

## 2014-12-15 DIAGNOSIS — I255 Ischemic cardiomyopathy: Secondary | ICD-10-CM | POA: Diagnosis not present

## 2014-12-15 DIAGNOSIS — Z95810 Presence of automatic (implantable) cardiac defibrillator: Secondary | ICD-10-CM | POA: Diagnosis not present

## 2014-12-15 DIAGNOSIS — I10 Essential (primary) hypertension: Secondary | ICD-10-CM | POA: Diagnosis not present

## 2014-12-15 DIAGNOSIS — Z79899 Other long term (current) drug therapy: Secondary | ICD-10-CM

## 2014-12-15 DIAGNOSIS — Z951 Presence of aortocoronary bypass graft: Secondary | ICD-10-CM

## 2014-12-15 LAB — POTASSIUM: Potassium: 3.8 mEq/L (ref 3.5–5.1)

## 2014-12-15 NOTE — Progress Notes (Signed)
[address] [place], [place]  [address] Phone: [phone] Fax:  [phone]  Date:  12/15/2014   ID:  [name], DOB Oct 28, 1932, MRN [id number]  PCP:  Pearla Dubonnet, MD   History of Present Illness: [name] is a 81 y.o. female ischemic/nonischemic cardiomyopathy status post bypass surgery as well as mitral valve repair with biventricular defibrillator, [employer] with partial resolution of her ejection fraction from 10% up to EF 70% on 2015 NUC stress with no ischemia).ICD replacement February 2016    unfortunately have femur fracture. She had her knee replacement. In fact went to the emergency department after she was extremely fatigued, CT angiogram was normal. Her BNP was slightly elevated and she received one dose of Lasix which may or may not have help, hypokalemia noted He states that she is not a sharp as  she once was.   Wt Readings from Last 3 Encounters:  12/15/14 107 lb (48.535 kg)  11/12/14 111 lb 3.2 oz (50.44 kg)  10/22/14 112 lb 12.8 oz (51.166 kg)     Past Medical History  Diagnosis Date  . Ischemic cardiomyopathy     severe. Left ventricular ejection fraction 20%.   . Chronic systolic heart failure     NYHA class II.  [name] Chronic pulmonary disease   . BBB (bundle branch block)     s/p BiV ICD implant  . Raynaud's syndrome   . Neuromuscular scoliosis of thoracolumbar region     type of scoliosis was not specified.   [name] DJD (degenerative joint disease), cervical   . DJD (degenerative joint disease), lumbar   . HTN (hypertension)   . Hyperthyroidism     following Graves disease  . Renal artery stenosis     Treated with angioplast in 1980 and 1987.   . S/P CABG (coronary artery bypass graft) April 2012  . FH: mitral valve repair     with 26 mm Edwards ring angioplasty,   . COPD (chronic obstructive pulmonary disease)   . Full dentures   . PONV (postoperative nausea and vomiting)   . CHF (congestive heart failure)   .  Dizziness   . Automatic implantable cardioverter-defibrillator in situ   . Pacemaker   . Asthma   . Pneumonia     hx  . GERD (gastroesophageal reflux disease)   . Anemia     takes iron 3 days per week  . Hx of cardiovascular stress test     Lexiscan Myoview (9/15):  Normal stress nuclear study.  LV Ejection Fraction: 76%    Past Surgical History  Procedure Laterality Date  . Total abdominal hysterectomy    . Sympathectomy    . Tonsillectomy    . Renal artery ballon dilation    . Rotator cuff repair      right and left  . Laminotomy/foraminotomy      with decompression of the L4 nerve root   . Cervical fusion      C5-6 and C6-7, C4-5 with titanium plates  . Umbilical hernia repair    . Wedge resection  2001    for the right upper lobe for Aspergillus treatement.  Dr. Edwyna Shell apprix 2001.  [name] Ptca      of bilateral renal arteries  . Cataract extraction    . Carpal tunnel release    . Appendectomy    . Breast lumpectomy      left breast  . Renal artery  ballon dilation      x2  . Precancerous growth      tops of ear removed. bilateral.   . Implantation of icd  2010    BiV ICD implant (SJM) by Dr Amil Amen 03/2009  . Repair extensor tendon  07/10/2012    Procedure: REPAIR EXTENSOR TENDON;  Surgeon: Nicki Reaper, MD;  Location: [facility];  Service: Orthopedics;  Laterality: Right;  METACARPAL PHALANGEAL REPLACEMENT ARTHROPLASTIES RIGHT INDEX, MIDDLE, AND RING FINGERS    . Finger arthroplasty  07/10/2012    Procedure: FINGER ARTHROPLASTY;  Surgeon: Nicki Reaper, MD;  Location: [facility];  Service: Orthopedics;  Laterality: Right;  METACARPAL PHALANGEAL ARTHROPLASTIES RIGHT INDEX, MIDDLE, AND RING FINGERS   . Esophageal manometry N/A 04/07/2013    Procedure: ESOPHAGEAL MANOMETRY (EM);  Surgeon: Charolett Bumpers, MD;  Location: [facility];  Service: Endoscopy;  Laterality: N/A;  . Carpometacarpel suspension plasty Right 11/12/2013    Procedure:  SUSPENSION PLASTY RIGHT THUMB, TRAPEZIUM EXCISION;  Surgeon: Nicki Reaper, MD;  Location: [facility];  Service: Orthopedics;  Laterality: Right;  . Tendon transfer Right 11/12/2013    Procedure: RIGHT ABDUCTOR POLLICUS LONGUS TENDON TRANSFER;  Surgeon: Nicki Reaper, MD;  Location: [facility];  Service: Orthopedics;  Laterality: Right;  . Tonsillectomy    . Hemorrhoidectomy with hemorrhoid banding    . Facial cosmetic surgery    . Back surgery    . Eye surgery Bilateral     cataracts  . Hernia repair      umbilical  . Lung mass removal Right   . Left knee arthroscopic    . Coronary artery bypass graft  2010    mvr/cabg  . Total knee arthroplasty Left 04/29/2014    Procedure: LEFT TOTAL KNEE ARTHROPLASTY;  Surgeon: Nadara Mustard, MD;  Location: [facility];  Service: Orthopedics;  Laterality: Left;  . Femur im nail Left 09/29/2014    Procedure: Affixus Trochanteric Femoral Nail;  Surgeon: Eldred Manges, MD;  Location: [facility];  Service: Orthopedics;  Laterality: Left;  . Biv icd genertaor change out N/A 11/11/2014    Procedure: BIV ICD GENERTAOR CHANGE OUT;  Surgeon: Hillis Range, MD;  Location: [facility];  Service: Cardiovascular;  Laterality: N/A;    Current Outpatient Prescriptions  Medication Sig Dispense Refill  . acetaminophen (TYLENOL) 500 MG tablet Take 500 mg by mouth every 6 (six) hours as needed for mild pain or moderate pain.    . carvedilol (COREG) 12.5 MG tablet Take 1 tablet (12.5 mg total) by mouth 2 (two) times daily. 180 tablet 3  . Cholecalciferol (VITAMIN D3) 2000 UNITS capsule Take 2,000 Units by mouth daily.     . Coenzyme Q10 (COQ10) 100 MG CAPS Take 100 mg by mouth daily.     . diphenhydrAMINE (BENADRYL) 25 mg capsule Take 1 capsule (25 mg total) by mouth every 6 (six) hours as needed. 30 capsule 1  . feeding supplement, RESOURCE BREEZE, (RESOURCE BREEZE) LIQD Take 1 Container by mouth 3 (three) times daily between meals.  0  . fluconazole  (DIFLUCAN) 100 MG tablet Take 100 mg by mouth once a week.    . levothyroxine (SYNTHROID, LEVOTHROID) 75 MCG tablet Take 75 mcg by mouth daily before breakfast.    . montelukast (SINGULAIR) 10 MG tablet Take 10 mg by mouth daily.     . pantoprazole (PROTONIX) 40 MG tablet Take 40 mg by mouth 2 (two) times  daily.    . potassium chloride SA (K-DUR,KLOR-CON) 20 MEQ tablet Take 1 tablet (20 mEq total) by mouth daily. 30 tablet 0  . traMADol (ULTRAM) 50 MG tablet Take 1 tablet (50 mg total) by mouth every 6 (six) hours as needed. 20 tablet 0  . valsartan (DIOVAN) 80 MG tablet Take 1 tablet (80 mg total) by mouth daily. 90 tablet 3   No current facility-administered medications for this visit.    Allergies:    Allergies  Allergen Reactions  . Alprazolam Other (See Comments)    REACTION: ulcer's in mouth and extreme constipation  . Doxycycline Other (See Comments)    REACTION: severe rash over entire body  . Fulvicin P-G [Griseofulvin] Other (See Comments)    Severe headaches  . Hydrocodone Other (See Comments)    REACTION: nausea and totally out of it  . Ketoconazole Other (See Comments)    REACTION: terribly weak, voice shook  . Serevent [Salmeterol] Other (See Comments)    shaking  . Calcitonin (Salmon) Other (See Comments)    REACTION: rash over entire body  . Morphine And Related Nausea And Vomiting  . Amitriptyline     unknown  . Cefuroxime Axetil Other (See Comments)    REACTION: either rash or diarrhea  . Hydrocodone-Acetaminophen Other (See Comments)    REACTION: reaction forgotten. Lorcet, percodan  . Other     Ketacosol= terribly week  . Sulfamethoxazole-Trimethoprim Other (See Comments)    REACTION: either rash or diarrhea  . Sulfonamide Derivatives Other (See Comments)    REACTION: reaction forgotten  . Trovan [Alatrofloxacin] Nausea And Vomiting  . Aspirin Other (See Comments)    REACTION: upsets stomach  . Cephalexin Rash  . Ciprofloxacin Rash  . Clonazepam Other  (See Comments)    REACTION: 1/2 pill makes grogginess next day  . Erythromycin Rash  . Hydromorphone Rash  . Oxycodone-Aspirin Other (See Comments)    REACTION: nausea  . Penicillins Rash  . Tapazole [Thiamazole] Rash    Social History:  The patient  reports that she has never smoked. She does not have any smokeless tobacco history on file. She reports that she does not drink alcohol.   ROS:  Please see the history of present illness.   Denies any fevers, chills, chest pain, orthopnea. She is not complaining currently of shortness of breath. Positive neck pain.   All other systems reviewed and negative.   PHYSICAL EXAM: VS:  BP 116/50 mmHg  Pulse 69  Ht 5' (1.524 m)  Wt 107 lb (48.535 kg)  BMI 20.90 kg/m2 Thin, elderly, in no acute distress HEENT: normalCervical neck scar is noted Neck: no JVD Cardiac:  normal S1, S2; RRR;  no murmurICD noted.  Lungs:  clear to auscultation bilaterally, no wheezing, rhonchi or rales Abd: soft, nontender, no hepatomegaly Ext: no edema Skin: warm and dry Neuro: no focal abnormalities noted  EKG:    Prior EKG demonstrated biventricular pacing, occasional PAC/PVC  ASSESSMENT AND PLAN:  1. Cardiomyopathy- Most recent ejection fraction reassuring  on echocardiogram, 70% on nuclear stress test. Blood pressure improved with increase of Diovan. I would like to continue with carvedilol at current dosing also to help suppress PAT/PVCs. I'm very reassured by her current nuclear stress test shows no ischemia, improved ejection fraction. Thankfully, her symptoms do not seem to be cardiac rhythm. 2. Ischemic cardiomyopathy/nonischemic-reassuring improvement. 3. Prior mitral valve repair 4. Prior knee replacement-slowly improving. unfortunate femur fracture.  5. Prior bypass, CABG 6. Bundle branch block-underlying,  chronic. Currently biventricular paced 7. Biventricular pacemaker/ICD-functioning well. Dr. Johney Frame. recent replacement February 2016   8. Hypokalemia-I will check potassium level. 3.2, 3.5 at last check. Continue with potassium supplementation unless otherwise noted.  9. 6 month followup   Signed, Donato Schultz, MD [facility]  12/15/2014 2:01 PM

## 2014-12-15 NOTE — Patient Instructions (Signed)
The current medical regimen is effective;  continue present plan and medications.  Please have blood work today to check your potassium level.  Follow up in 6 months with Dr. Anne Fu.  You will receive a letter in the mail 2 months before you are due.  Please call us when you receive this letter to schedule your follow up appointment.  Thank you for choosing [facility]!!

## 2015-01-19 DIAGNOSIS — S72332D Displaced oblique fracture of shaft of left femur, subsequent encounter for closed fracture with routine healing: Secondary | ICD-10-CM | POA: Diagnosis not present

## 2015-01-20 ENCOUNTER — Encounter: Payer: Self-pay | Admitting: Internal Medicine

## 2015-01-22 DIAGNOSIS — I4891 Unspecified atrial fibrillation: Secondary | ICD-10-CM | POA: Diagnosis not present

## 2015-01-22 DIAGNOSIS — J449 Chronic obstructive pulmonary disease, unspecified: Secondary | ICD-10-CM | POA: Diagnosis not present

## 2015-01-22 DIAGNOSIS — K219 Gastro-esophageal reflux disease without esophagitis: Secondary | ICD-10-CM | POA: Diagnosis not present

## 2015-01-22 DIAGNOSIS — I10 Essential (primary) hypertension: Secondary | ICD-10-CM | POA: Diagnosis not present

## 2015-01-22 DIAGNOSIS — G4700 Insomnia, unspecified: Secondary | ICD-10-CM | POA: Diagnosis not present

## 2015-01-22 DIAGNOSIS — R61 Generalized hyperhidrosis: Secondary | ICD-10-CM | POA: Diagnosis not present

## 2015-01-22 DIAGNOSIS — S7290XA Unspecified fracture of unspecified femur, initial encounter for closed fracture: Secondary | ICD-10-CM | POA: Diagnosis not present

## 2015-01-22 DIAGNOSIS — E039 Hypothyroidism, unspecified: Secondary | ICD-10-CM | POA: Diagnosis not present

## 2015-02-08 ENCOUNTER — Encounter: Payer: Medicare Other | Admitting: Internal Medicine

## 2015-03-18 IMAGING — CR DG CHEST 2V
2 series · 2 of 2 positions shown · non-contrast
Comparison: PA and lateral chest 04/22/2014 05/25/2011.

CLINICAL DATA: Shortness of breath for 1 week, worsened over the
past 2 days.

EXAM:
CHEST  2 VIEW

[w chest pa]
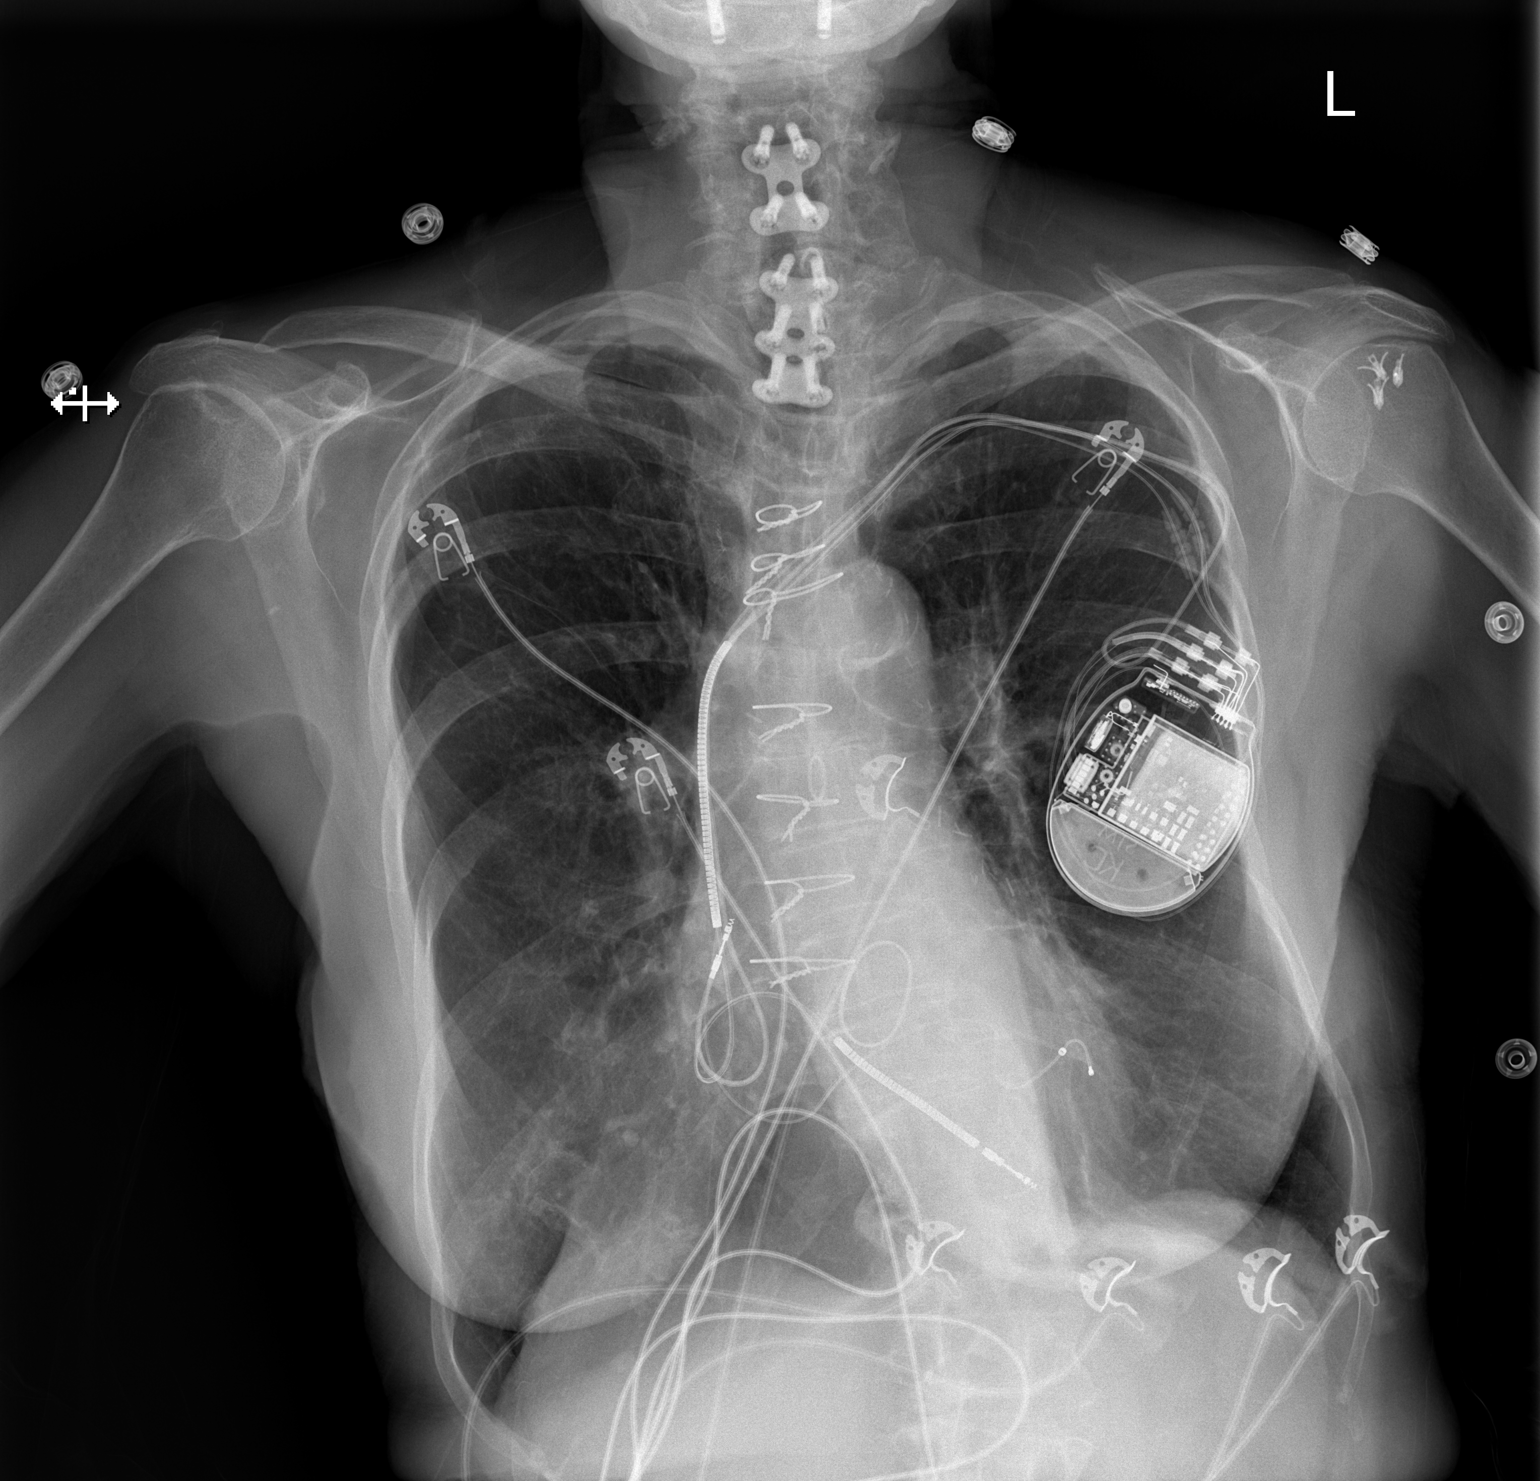

[w chest lat]
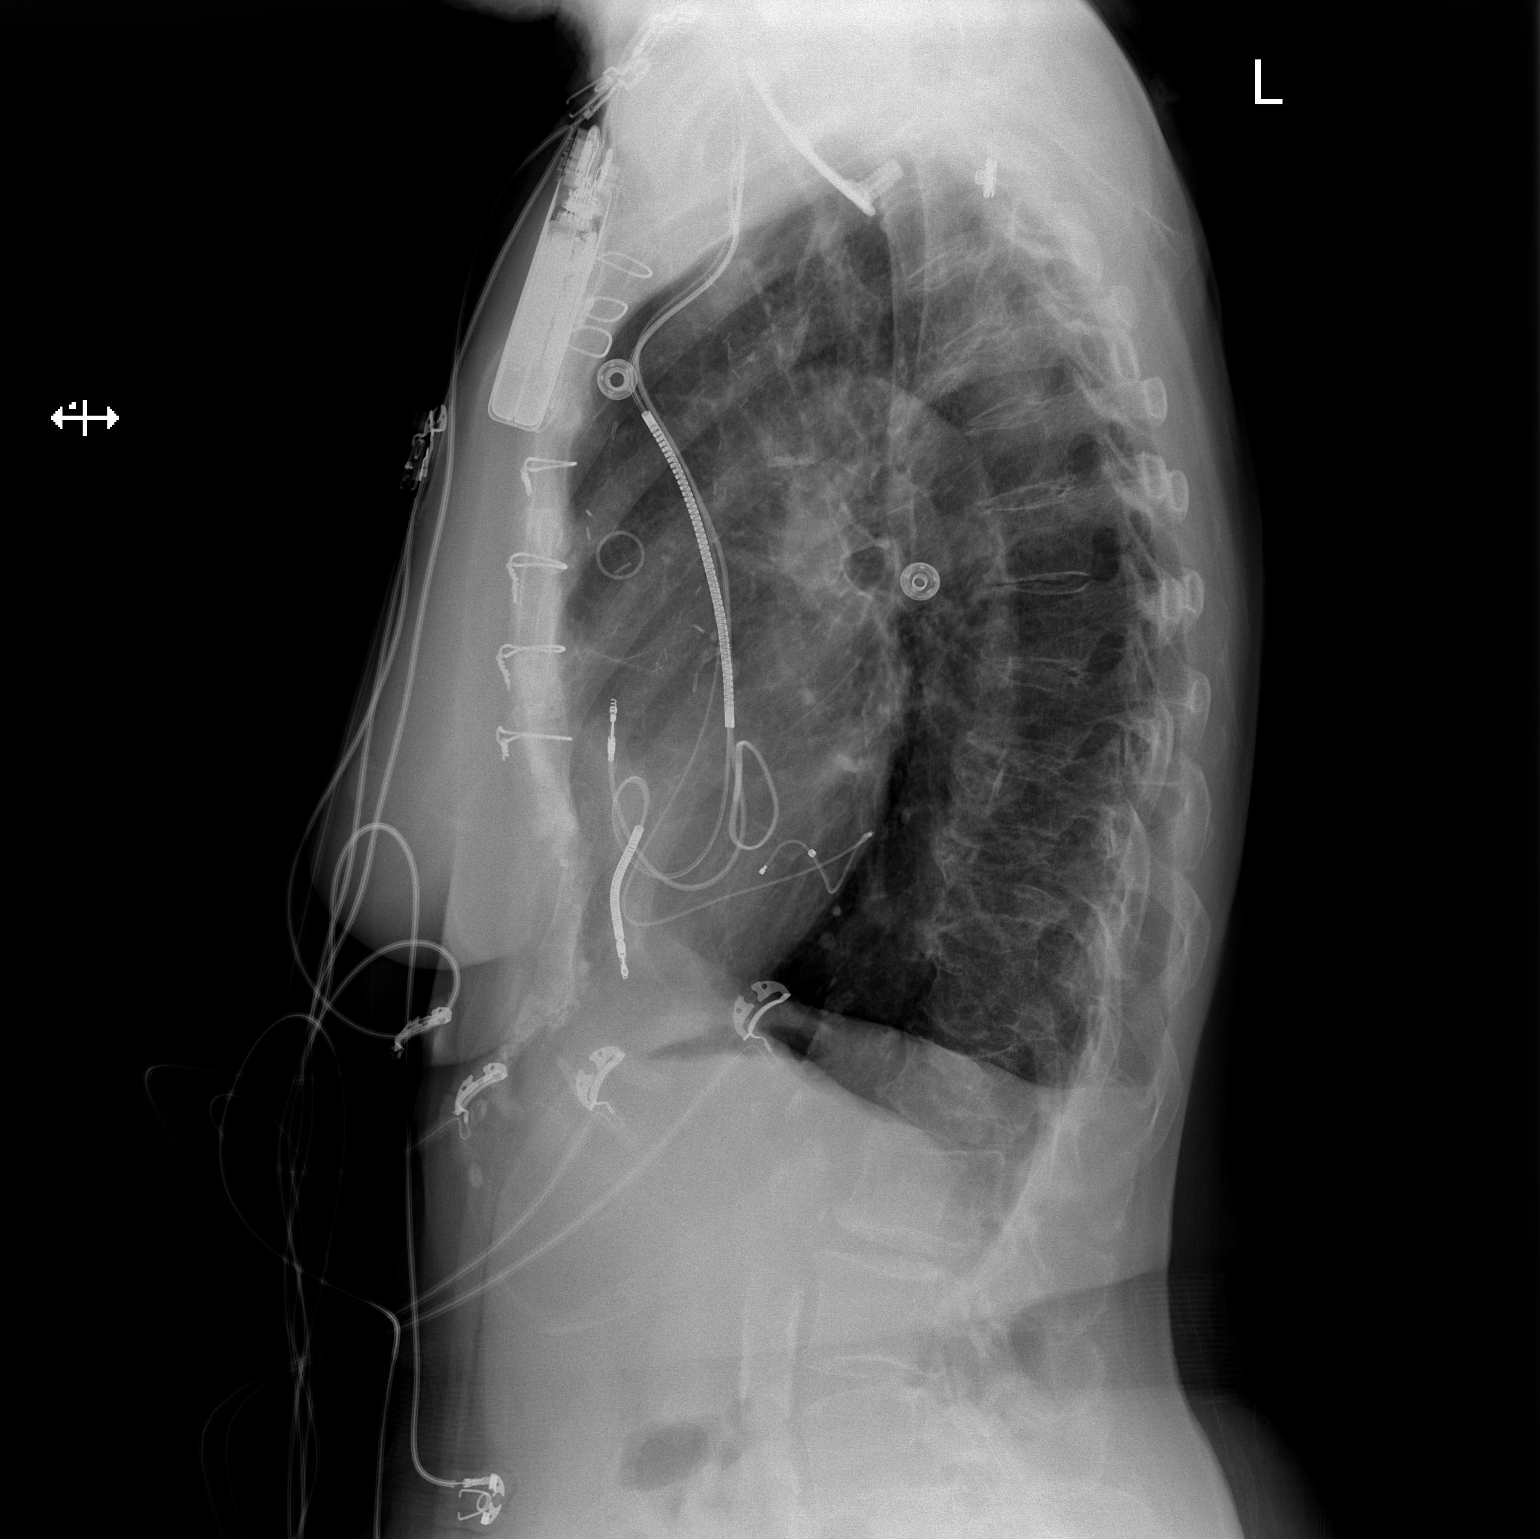

[2 of 2 positions shown; findings below may reference images not displayed]

FINDINGS: AICD is in place, unchanged. The patient is status post CABG and
mitral valve annuloplasty. The chest is hyperexpanded but the lungs
are clear. Heart size is normal. No pneumothorax or pleural
effusion. No focal bony abnormality. The patient is status post
cervical fusion. Postoperative change about the shoulders also
noted.
IMPRESSION: No acute finding.  Stable compared to prior exam.

## 2015-04-01 DIAGNOSIS — E039 Hypothyroidism, unspecified: Secondary | ICD-10-CM | POA: Diagnosis not present

## 2015-04-01 DIAGNOSIS — E782 Mixed hyperlipidemia: Secondary | ICD-10-CM | POA: Diagnosis not present

## 2015-04-01 DIAGNOSIS — S7290XA Unspecified fracture of unspecified femur, initial encounter for closed fracture: Secondary | ICD-10-CM | POA: Diagnosis not present

## 2015-04-01 DIAGNOSIS — I4891 Unspecified atrial fibrillation: Secondary | ICD-10-CM | POA: Diagnosis not present

## 2015-04-01 DIAGNOSIS — M549 Dorsalgia, unspecified: Secondary | ICD-10-CM | POA: Diagnosis not present

## 2015-04-01 DIAGNOSIS — M79609 Pain in unspecified limb: Secondary | ICD-10-CM | POA: Diagnosis not present

## 2015-04-01 DIAGNOSIS — J449 Chronic obstructive pulmonary disease, unspecified: Secondary | ICD-10-CM | POA: Diagnosis not present

## 2015-04-01 DIAGNOSIS — I10 Essential (primary) hypertension: Secondary | ICD-10-CM | POA: Diagnosis not present

## 2015-04-01 DIAGNOSIS — I5022 Chronic systolic (congestive) heart failure: Secondary | ICD-10-CM | POA: Diagnosis not present

## 2015-04-01 DIAGNOSIS — G4700 Insomnia, unspecified: Secondary | ICD-10-CM | POA: Diagnosis not present

## 2015-04-01 DIAGNOSIS — R61 Generalized hyperhidrosis: Secondary | ICD-10-CM | POA: Diagnosis not present

## 2015-04-01 DIAGNOSIS — K219 Gastro-esophageal reflux disease without esophagitis: Secondary | ICD-10-CM | POA: Diagnosis not present

## 2015-05-12 DIAGNOSIS — R197 Diarrhea, unspecified: Secondary | ICD-10-CM | POA: Diagnosis not present

## 2015-05-12 DIAGNOSIS — G4700 Insomnia, unspecified: Secondary | ICD-10-CM | POA: Diagnosis not present

## 2015-05-12 DIAGNOSIS — I10 Essential (primary) hypertension: Secondary | ICD-10-CM | POA: Diagnosis not present

## 2015-05-12 DIAGNOSIS — I4891 Unspecified atrial fibrillation: Secondary | ICD-10-CM | POA: Diagnosis not present

## 2015-05-12 DIAGNOSIS — R61 Generalized hyperhidrosis: Secondary | ICD-10-CM | POA: Diagnosis not present

## 2015-05-12 DIAGNOSIS — I5022 Chronic systolic (congestive) heart failure: Secondary | ICD-10-CM | POA: Diagnosis not present

## 2015-05-12 DIAGNOSIS — S7290XA Unspecified fracture of unspecified femur, initial encounter for closed fracture: Secondary | ICD-10-CM | POA: Diagnosis not present

## 2015-05-12 DIAGNOSIS — E039 Hypothyroidism, unspecified: Secondary | ICD-10-CM | POA: Diagnosis not present

## 2015-05-12 DIAGNOSIS — J449 Chronic obstructive pulmonary disease, unspecified: Secondary | ICD-10-CM | POA: Diagnosis not present

## 2015-05-12 DIAGNOSIS — M549 Dorsalgia, unspecified: Secondary | ICD-10-CM | POA: Diagnosis not present

## 2015-05-12 DIAGNOSIS — M79609 Pain in unspecified limb: Secondary | ICD-10-CM | POA: Diagnosis not present

## 2015-05-12 DIAGNOSIS — K219 Gastro-esophageal reflux disease without esophagitis: Secondary | ICD-10-CM | POA: Diagnosis not present

## 2015-07-02 ENCOUNTER — Ambulatory Visit (INDEPENDENT_AMBULATORY_CARE_PROVIDER_SITE_OTHER): Payer: Medicare Other | Admitting: *Deleted

## 2015-07-02 DIAGNOSIS — Z95810 Presence of automatic (implantable) cardiac defibrillator: Secondary | ICD-10-CM | POA: Diagnosis not present

## 2015-07-02 DIAGNOSIS — I255 Ischemic cardiomyopathy: Secondary | ICD-10-CM

## 2015-07-02 DIAGNOSIS — I454 Nonspecific intraventricular block: Secondary | ICD-10-CM

## 2015-07-02 DIAGNOSIS — I5022 Chronic systolic (congestive) heart failure: Secondary | ICD-10-CM | POA: Diagnosis not present

## 2015-07-03 ENCOUNTER — Encounter: Payer: Self-pay | Admitting: Internal Medicine

## 2015-07-05 LAB — CUP PACEART REMOTE DEVICE CHECK
Battery Remaining Percentage: 89 %
Brady Statistic AP VS Percent: 3.1 %
Brady Statistic AS VP Percent: 48 %
Brady Statistic AS VS Percent: 1 %
Brady Statistic RA Percent Paced: 50 %
Date Time Interrogation Session: 20161008080032
HIGH POWER IMPEDANCE MEASURED VALUE: 51 Ohm
HighPow Impedance: 51 Ohm
Lead Channel Impedance Value: 390 Ohm
Lead Channel Impedance Value: 540 Ohm
Lead Channel Pacing Threshold Amplitude: 0.75 V
Lead Channel Pacing Threshold Amplitude: 1 V
Lead Channel Pacing Threshold Amplitude: 1 V
Lead Channel Pacing Threshold Pulse Width: 0.5 ms
Lead Channel Pacing Threshold Pulse Width: 0.6 ms
Lead Channel Setting Pacing Amplitude: 2 V
Lead Channel Setting Pacing Amplitude: 2 V
Lead Channel Setting Pacing Amplitude: 2.5 V
Lead Channel Setting Pacing Pulse Width: 0.5 ms
Lead Channel Setting Sensing Sensitivity: 0.5 mV
MDC IDC MSMT BATTERY REMAINING LONGEVITY: 68 mo
MDC IDC MSMT BATTERY VOLTAGE: 3.07 V
MDC IDC MSMT LEADCHNL LV PACING THRESHOLD PULSEWIDTH: 0.5 ms
MDC IDC MSMT LEADCHNL RA IMPEDANCE VALUE: 390 Ohm
MDC IDC MSMT LEADCHNL RA SENSING INTR AMPL: 1.4 mV
MDC IDC MSMT LEADCHNL RV SENSING INTR AMPL: 9.8 mV
MDC IDC PG SERIAL: 7226908
MDC IDC SET LEADCHNL RV PACING PULSEWIDTH: 0.6 ms
MDC IDC SET ZONE DETECTION INTERVAL: 300 ms
MDC IDC SET ZONE DETECTION INTERVAL: 400 ms
MDC IDC STAT BRADY AP VP PERCENT: 48 %

## 2015-07-05 NOTE — Progress Notes (Signed)
Remote CRT-D device check. Sensing consistent with previous device measurements. Lead impedance trends stable over time. 1 mode switch, <1%. 5 NSVT---all SVT. Patient bi-ventricularly pacing 96% of the time. Device programmed with appropriate safety margins. Heart failure diagnostics not reviewed---CorVue not turned on. Estimated longevity 5.48yrs. Overdue to see JA.

## 2015-07-12 DIAGNOSIS — I10 Essential (primary) hypertension: Secondary | ICD-10-CM | POA: Diagnosis not present

## 2015-07-12 DIAGNOSIS — J449 Chronic obstructive pulmonary disease, unspecified: Secondary | ICD-10-CM | POA: Diagnosis not present

## 2015-07-12 DIAGNOSIS — I4891 Unspecified atrial fibrillation: Secondary | ICD-10-CM | POA: Diagnosis not present

## 2015-07-12 DIAGNOSIS — S7290XA Unspecified fracture of unspecified femur, initial encounter for closed fracture: Secondary | ICD-10-CM | POA: Diagnosis not present

## 2015-07-12 DIAGNOSIS — K219 Gastro-esophageal reflux disease without esophagitis: Secondary | ICD-10-CM | POA: Diagnosis not present

## 2015-07-12 DIAGNOSIS — M549 Dorsalgia, unspecified: Secondary | ICD-10-CM | POA: Diagnosis not present

## 2015-07-12 DIAGNOSIS — G4700 Insomnia, unspecified: Secondary | ICD-10-CM | POA: Diagnosis not present

## 2015-07-12 DIAGNOSIS — R61 Generalized hyperhidrosis: Secondary | ICD-10-CM | POA: Diagnosis not present

## 2015-07-12 DIAGNOSIS — R197 Diarrhea, unspecified: Secondary | ICD-10-CM | POA: Diagnosis not present

## 2015-07-12 DIAGNOSIS — M79609 Pain in unspecified limb: Secondary | ICD-10-CM | POA: Diagnosis not present

## 2015-07-12 DIAGNOSIS — I5022 Chronic systolic (congestive) heart failure: Secondary | ICD-10-CM | POA: Diagnosis not present

## 2015-07-12 DIAGNOSIS — Z23 Encounter for immunization: Secondary | ICD-10-CM | POA: Diagnosis not present

## 2015-08-06 ENCOUNTER — Encounter: Payer: Self-pay | Admitting: Cardiology

## 2015-08-10 ENCOUNTER — Other Ambulatory Visit: Payer: Self-pay | Admitting: *Deleted

## 2015-08-13 ENCOUNTER — Encounter: Payer: Self-pay | Admitting: *Deleted

## 2015-08-13 ENCOUNTER — Other Ambulatory Visit: Payer: Self-pay | Admitting: *Deleted

## 2015-08-13 NOTE — Patient Outreach (Signed)
Screening call - High Risk Patient.  [name] resides in the main building at [employer] with her husband. They go to the main dining room for their meals. They are both independent but help each other in any way necessary. They both fill a weekly medication box and take their meds as ordered. She reports she does not weigh daily but she does not have any SOB or edema. She also had a poodle and gets exercise by walking him 4 times a day. We both feel like they do not have any current care management needs. I will send her a letter and our brochure for future reference.  [name] [facility] Care Manager [phone]

## 2015-08-16 DIAGNOSIS — R413 Other amnesia: Secondary | ICD-10-CM | POA: Diagnosis not present

## 2015-08-16 DIAGNOSIS — F0390 Unspecified dementia without behavioral disturbance: Secondary | ICD-10-CM | POA: Diagnosis not present

## 2015-08-16 DIAGNOSIS — M79604 Pain in right leg: Secondary | ICD-10-CM | POA: Diagnosis not present

## 2015-08-31 ENCOUNTER — Encounter (HOSPITAL_COMMUNITY): Payer: Self-pay

## 2015-08-31 ENCOUNTER — Emergency Department (HOSPITAL_COMMUNITY): Payer: Medicare Other

## 2015-08-31 ENCOUNTER — Emergency Department (HOSPITAL_COMMUNITY)
Admission: EM | Admit: 2015-08-31 | Discharge: 2015-08-31 | Disposition: A | Discharge status: Home / Self Care | Payer: Medicare Other | Attending: Emergency Medicine | Admitting: Emergency Medicine

## 2015-08-31 DIAGNOSIS — R404 Transient alteration of awareness: Secondary | ICD-10-CM | POA: Diagnosis not present

## 2015-08-31 DIAGNOSIS — I10 Essential (primary) hypertension: Secondary | ICD-10-CM | POA: Diagnosis not present

## 2015-08-31 DIAGNOSIS — D649 Anemia, unspecified: Secondary | ICD-10-CM | POA: Diagnosis not present

## 2015-08-31 DIAGNOSIS — Z95810 Presence of automatic (implantable) cardiac defibrillator: Secondary | ICD-10-CM | POA: Insufficient documentation

## 2015-08-31 DIAGNOSIS — Z8701 Personal history of pneumonia (recurrent): Secondary | ICD-10-CM | POA: Diagnosis not present

## 2015-08-31 DIAGNOSIS — Z951 Presence of aortocoronary bypass graft: Secondary | ICD-10-CM | POA: Diagnosis not present

## 2015-08-31 DIAGNOSIS — I2510 Atherosclerotic heart disease of native coronary artery without angina pectoris: Secondary | ICD-10-CM | POA: Diagnosis not present

## 2015-08-31 DIAGNOSIS — N390 Urinary tract infection, site not specified: Secondary | ICD-10-CM | POA: Diagnosis not present

## 2015-08-31 DIAGNOSIS — R410 Disorientation, unspecified: Secondary | ICD-10-CM | POA: Diagnosis not present

## 2015-08-31 DIAGNOSIS — J449 Chronic obstructive pulmonary disease, unspecified: Secondary | ICD-10-CM | POA: Diagnosis not present

## 2015-08-31 DIAGNOSIS — Z880 Allergy status to penicillin: Secondary | ICD-10-CM | POA: Diagnosis not present

## 2015-08-31 DIAGNOSIS — I5022 Chronic systolic (congestive) heart failure: Secondary | ICD-10-CM | POA: Diagnosis not present

## 2015-08-31 DIAGNOSIS — K219 Gastro-esophageal reflux disease without esophagitis: Secondary | ICD-10-CM | POA: Diagnosis not present

## 2015-08-31 DIAGNOSIS — R531 Weakness: Secondary | ICD-10-CM | POA: Diagnosis not present

## 2015-08-31 DIAGNOSIS — Z79899 Other long term (current) drug therapy: Secondary | ICD-10-CM | POA: Insufficient documentation

## 2015-08-31 DIAGNOSIS — Z8739 Personal history of other diseases of the musculoskeletal system and connective tissue: Secondary | ICD-10-CM | POA: Insufficient documentation

## 2015-08-31 DIAGNOSIS — Z972 Presence of dental prosthetic device (complete) (partial): Secondary | ICD-10-CM | POA: Diagnosis not present

## 2015-08-31 DIAGNOSIS — E0500 Thyrotoxicosis with diffuse goiter without thyrotoxic crisis or storm: Secondary | ICD-10-CM | POA: Diagnosis not present

## 2015-08-31 LAB — COMPREHENSIVE METABOLIC PANEL
ALBUMIN: 4.3 g/dL (ref 3.5–5.0)
ALT: 17 U/L (ref 14–54)
AST: 31 U/L (ref 15–41)
Alkaline Phosphatase: 64 U/L (ref 38–126)
Anion gap: 13 (ref 5–15)
BUN: 12 mg/dL (ref 6–20)
CHLORIDE: 97 mmol/L — AB (ref 101–111)
CO2: 26 mmol/L (ref 22–32)
Calcium: 10.1 mg/dL (ref 8.9–10.3)
Creatinine, Ser: 0.8 mg/dL (ref 0.44–1.00)
GFR calc Af Amer: >60 mL/min (ref >60)
GLUCOSE: 132 mg/dL — AB (ref 65–99)
POTASSIUM: 4 mmol/L (ref 3.5–5.1)
Sodium: 136 mmol/L (ref 135–145)
Total Bilirubin: 1.7 mg/dL — ABNORMAL HIGH (ref 0.3–1.2)
Total Protein: 8.1 g/dL (ref 6.5–8.1)

## 2015-08-31 LAB — CBC WITH DIFFERENTIAL/PLATELET
Basophils Absolute: 0 10*3/uL (ref 0.0–0.1)
Basophils Relative: 1 %
Eosinophils Absolute: 0 10*3/uL (ref 0.0–0.7)
Eosinophils Relative: 1 %
HEMATOCRIT: 49.6 % — AB (ref 36.0–46.0)
Hemoglobin: 16.7 g/dL — ABNORMAL HIGH (ref 12.0–15.0)
LYMPHS PCT: 17 %
Lymphs Abs: 1.4 10*3/uL (ref 0.7–4.0)
MCH: 30.1 pg (ref 26.0–34.0)
MCHC: 33.7 g/dL (ref 30.0–36.0)
MCV: 89.5 fL (ref 78.0–100.0)
Monocytes Absolute: 0.5 10*3/uL (ref 0.1–1.0)
Monocytes Relative: 6 %
Neutro Abs: 6.2 10*3/uL (ref 1.7–7.7)
Neutrophils Relative %: 75 %
Platelets: 168 10*3/uL (ref 150–400)
RBC: 5.54 MIL/uL — AB (ref 3.87–5.11)
RDW: 13.3 % (ref 11.5–15.5)
WBC: 8.2 10*3/uL (ref 4.0–10.5)

## 2015-08-31 LAB — URINALYSIS, ROUTINE W REFLEX MICROSCOPIC
Bilirubin Urine: NEGATIVE
GLUCOSE, UA: NEGATIVE mg/dL
KETONES UR: 15 mg/dL — AB
LEUKOCYTES UA: NEGATIVE
Nitrite: NEGATIVE
PH: 7 (ref 5.0–8.0)
Protein, ur: >300 mg/dL — AB
SPECIFIC GRAVITY, URINE: 1.014 (ref 1.005–1.030)

## 2015-08-31 LAB — I-STAT CG4 LACTIC ACID, ED: Lactic Acid, Venous: 1.89 mmol/L (ref 0.5–2.0)

## 2015-08-31 LAB — URINE MICROSCOPIC-ADD ON

## 2015-08-31 MED ORDER — SODIUM CHLORIDE 0.9 % IV BOLUS (SEPSIS)
500.0000 mL | Freq: Once | INTRAVENOUS | Status: AC
  Administered 2015-08-31: 500 mL via INTRAVENOUS

## 2015-08-31 MED ORDER — FOSFOMYCIN TROMETHAMINE 3 G PO PACK
3.0000 g | PACK | Freq: Once | ORAL | Status: AC
  Administered 2015-08-31: 3 g via ORAL
  Filled 2015-08-31: qty 3

## 2015-08-31 MED ORDER — CARVEDILOL 12.5 MG PO TABS
12.5000 mg | ORAL_TABLET | Freq: Once | ORAL | Status: AC
  Administered 2015-08-31: 12.5 mg via ORAL
  Filled 2015-08-31: qty 1

## 2015-08-31 NOTE — ED Notes (Signed)
Pt. BP 265/106 consistantly. EDP notified.

## 2015-08-31 NOTE — ED Provider Notes (Signed)
CSN: [id number]     Arrival date & time 08/31/15  1147 History   First MD Initiated Contact with Patient 08/31/15 1231     Chief Complaint  Patient presents with  . Weakness   [name] is a 81 y.o. female  With history of ischemic cardio myopathy, CHF, hypertension, hyperthyroidism, coronary artery disease, COPD, CHF, and asthma who presents to the emergency department with her husband from  An assisted living facility who reports the patient has been more confused recently. The patient's husband reports over the past 3 days the patient has been confused and not responding with appropriate answers. He reports that he found her  Yesterday confused about taking the dogs out for a walk. The patient reports she is just generally not feeling herself. They deny any falls. They deny any fevers, chest pain, shortness of breath, coughing, wheezing, abdominal pain, nausea, vomiting, diarrhea, numbness, tingling, weakness, falls, loss of consciousness, syncope, lightheadedness, dizziness , right urinary symptoms or rashes.  (Consider location/radiation/quality/duration/timing/severity/associated sxs/prior Treatment) HPI  Past Medical History  Diagnosis Date  . Ischemic cardiomyopathy     severe. Left ventricular ejection fraction 20%.   . Chronic systolic heart failure (HCC)     NYHA class II.  [name] Chronic pulmonary disease   . BBB (bundle branch block)     s/p BiV ICD implant  . Raynaud's syndrome   . Neuromuscular scoliosis of thoracolumbar region     type of scoliosis was not specified.   [name] DJD (degenerative joint disease), cervical   . DJD (degenerative joint disease), lumbar   . HTN (hypertension)   . Hyperthyroidism     following Graves disease  . Renal artery stenosis (HCC)     Treated with angioplast in 1980 and 1987.   . S/P CABG (coronary artery bypass graft) April 2012  . FH: mitral valve repair     with 26 mm Edwards ring angioplasty,   . COPD (chronic obstructive pulmonary  disease) (HCC)   . Full dentures   . PONV (postoperative nausea and vomiting)   . CHF (congestive heart failure) (HCC)   . Dizziness   . Automatic implantable cardioverter-defibrillator in situ   . Pacemaker   . Asthma   . Pneumonia     hx  . GERD (gastroesophageal reflux disease)   . Anemia     takes iron 3 days per week  . Hx of cardiovascular stress test     Lexiscan Myoview (9/15):  Normal stress nuclear study.  LV Ejection Fraction: 76%   Past Surgical History  Procedure Laterality Date  . Total abdominal hysterectomy    . Sympathectomy    . Tonsillectomy    . Renal artery ballon dilation    . Rotator cuff repair      right and left  . Laminotomy/foraminotomy      with decompression of the L4 nerve root   . Cervical fusion      C5-6 and C6-7, C4-5 with titanium plates  . Umbilical hernia repair    . Wedge resection  2001    for the right upper lobe for Aspergillus treatement.  Dr. Edwyna Shell apprix 2001.  [name] Ptca      of bilateral renal arteries  . Cataract extraction    . Carpal tunnel release    . Appendectomy    . Breast lumpectomy      left breast  . Renal artery ballon dilation      x2  . Precancerous growth  tops of ear removed. bilateral.   . Implantation of icd  2010    BiV ICD implant (SJM) by Dr Amil Amen 03/2009  . Repair extensor tendon  07/10/2012    Procedure: REPAIR EXTENSOR TENDON;  Surgeon: Nicki Reaper, MD;  Location: [facility];  Service: Orthopedics;  Laterality: Right;  METACARPAL PHALANGEAL REPLACEMENT ARTHROPLASTIES RIGHT INDEX, MIDDLE, AND RING FINGERS    . Finger arthroplasty  07/10/2012    Procedure: FINGER ARTHROPLASTY;  Surgeon: Nicki Reaper, MD;  Location: [facility];  Service: Orthopedics;  Laterality: Right;  METACARPAL PHALANGEAL ARTHROPLASTIES RIGHT INDEX, MIDDLE, AND RING FINGERS   . Esophageal manometry N/A 04/07/2013    Procedure: ESOPHAGEAL MANOMETRY (EM);  Surgeon: Charolett Bumpers, MD;  Location:  [facility];  Service: Endoscopy;  Laterality: N/A;  . Carpometacarpel suspension plasty Right 11/12/2013    Procedure: SUSPENSION PLASTY RIGHT THUMB, TRAPEZIUM EXCISION;  Surgeon: Nicki Reaper, MD;  Location: [facility];  Service: Orthopedics;  Laterality: Right;  . Tendon transfer Right 11/12/2013    Procedure: RIGHT ABDUCTOR POLLICUS LONGUS TENDON TRANSFER;  Surgeon: Nicki Reaper, MD;  Location: [facility];  Service: Orthopedics;  Laterality: Right;  . Tonsillectomy    . Hemorrhoidectomy with hemorrhoid banding    . Facial cosmetic surgery    . Back surgery    . Eye surgery Bilateral     cataracts  . Hernia repair      umbilical  . Lung mass removal Right   . Left knee arthroscopic    . Coronary artery bypass graft  2010    mvr/cabg  . Total knee arthroplasty Left 04/29/2014    Procedure: LEFT TOTAL KNEE ARTHROPLASTY;  Surgeon: Nadara Mustard, MD;  Location: [facility];  Service: Orthopedics;  Laterality: Left;  . Femur im nail Left 09/29/2014    Procedure: Affixus Trochanteric Femoral Nail;  Surgeon: Eldred Manges, MD;  Location: [facility];  Service: Orthopedics;  Laterality: Left;  . Biv icd genertaor change out N/A 11/11/2014    Procedure: BIV ICD GENERTAOR CHANGE OUT;  Surgeon: Hillis Range, MD;  Location: [facility];  Service: Cardiovascular;  Laterality: N/A;   Family History  Problem Relation Age of Onset  . Heart disease Father   . Hypertension Father   . Colon cancer      grandmother   Social History  Substance Use Topics  . Smoking status: Never Smoker   . Smokeless tobacco: None     Comment: passive smoker from birth to age 85 (mom and husband)  . Alcohol Use: No   OB History    No data available     Review of Systems  Constitutional: Negative for fever and chills.  HENT: Negative for congestion and sore throat.   Eyes: Negative for visual disturbance.  Respiratory: Negative for cough, shortness of breath and wheezing.   Cardiovascular:  Negative for chest pain and palpitations.  Gastrointestinal: Negative for nausea, vomiting, abdominal pain and diarrhea.  Genitourinary: Negative for dysuria.  Musculoskeletal: Negative for back pain and neck pain.  Skin: Negative for rash.  Neurological: Negative for dizziness, light-headedness and headaches.  Psychiatric/Behavioral: Positive for confusion.      Allergies  Alprazolam; Doxycycline; Fulvicin p-g; Hydrocodone; Ketoconazole; Serevent; Calcitonin (salmon); Morphine and related; Amitriptyline; Cefuroxime axetil; Hydrocodone-acetaminophen; Lorcet; Other; Sulfamethoxazole-trimethoprim; Sulfonamide derivatives; Trovan; Ventolin; Aspirin; Cephalexin; Ciprofloxacin; Clonazepam; Erythromycin; Hydromorphone; Miacalcin; Oxycodone-aspirin; Penicillins; and Tapazole  Home Medications   Prior to Admission medications  Medication Sig Start Date End Date Taking? Authorizing Provider  acetaminophen (TYLENOL) 500 MG tablet Take 500 mg by mouth every 6 (six) hours as needed for mild pain or moderate pain.   Yes Historical Provider, MD  carvedilol (COREG) 12.5 MG tablet Take 1 tablet (12.5 mg total) by mouth 2 (two) times daily. 04/01/14  Yes Jake Bathe, MD  Cholecalciferol (VITAMIN D3) 2000 UNITS capsule Take 2,000 Units by mouth daily.    Yes Historical Provider, MD  Coenzyme Q10 (COQ10) 100 MG CAPS Take 100 mg by mouth daily.    Yes Historical Provider, MD  fluconazole (DIFLUCAN) 100 MG tablet Take 100 mg by mouth once a week. Sundays   Yes Historical Provider, MD  levothyroxine (SYNTHROID, LEVOTHROID) 75 MCG tablet Take 75 mcg by mouth daily before breakfast.   Yes Historical Provider, MD  montelukast (SINGULAIR) 10 MG tablet Take 10 mg by mouth daily.    Yes Historical Provider, MD  NAMZARIC 28-10 MG CP24 Take 1 capsule by mouth daily. 08/30/15  Yes Historical Provider, MD  nortriptyline (PAMELOR) 25 MG capsule Take 25 mg by mouth at bedtime.   Yes Historical Provider, MD  pantoprazole  (PROTONIX) 40 MG tablet Take 40 mg by mouth daily.    Yes Historical Provider, MD  Potassium Gluconate 595 MG CAPS Take 1 capsule by mouth daily.   Yes Historical Provider, MD  Probiotic Product (PROBIOTIC DAILY PO) Take 1 capsule by mouth daily.   Yes Historical Provider, MD  valsartan (DIOVAN) 80 MG tablet Take 1 tablet (80 mg total) by mouth daily. 11/17/14  Yes Dwana Melena, PA-C  diphenhydrAMINE (BENADRYL) 25 mg capsule Take 1 capsule (25 mg total) by mouth every 6 (six) hours as needed. Patient not taking: Reported on 08/13/2015 11/12/14   Dwana Melena, PA-C  feeding supplement, RESOURCE BREEZE, (RESOURCE BREEZE) LIQD Take 1 Container by mouth 3 (three) times daily between meals. Patient not taking: Reported on 08/13/2015 10/03/14   Kathlen Mody, MD  traMADol (ULTRAM) 50 MG tablet Take 1 tablet (50 mg total) by mouth every 6 (six) hours as needed. Patient not taking: Reported on 08/13/2015 10/03/14   Kathlen Mody, MD   BP 220/91 mmHg  Pulse 71  Temp(Src) 97.5 F (36.4 C) (Rectal)  Resp 18  Ht 5' (1.524 m)  Wt 42.638 kg  BMI 18.36 kg/m2  SpO2 99% Physical Exam  Constitutional: She is oriented to person, place, and time. She appears well-developed and well-nourished. No distress.   Nontoxic appearing. Frail 81 year old female.  HENT:  Head: Normocephalic and atraumatic.  Right Ear: External ear normal.  Left Ear: External ear normal.  Mouth/Throat: Oropharynx is clear and moist. No oropharyngeal exudate.  Eyes: Conjunctivae and EOM are normal. Pupils are equal, round, and reactive to light. Right eye exhibits no discharge. Left eye exhibits no discharge.  Neck: Normal range of motion. Neck supple. No JVD present. No tracheal deviation present.  Cardiovascular: Normal rate, regular rhythm, normal heart sounds and intact distal pulses.  Exam reveals no gallop and no friction rub.   Bilateral radial, posterior tibialis and dorsalis pedis pulses are intact.    Pulmonary/Chest: Effort  normal and breath sounds normal. No respiratory distress. She has no wheezes. She has no rales.   Lungs are clear to auscultation bilaterally.  Abdominal: Soft. Bowel sounds are normal. She exhibits no distension. There is no tenderness. There is no guarding.   Abdomen is soft and nontender palpation.  Musculoskeletal: She exhibits no edema  or tenderness.   No lower extremity edema or tenderness. Patient has good strength in her bilateral upper and lower extremities.  Lymphadenopathy:    She has no cervical adenopathy.  Neurological: She is alert and oriented to person, place, and time. No cranial nerve deficit. Coordination normal.   The patient is alert 3. She initially has trouble with the year. Cranial nerves are intact. Sensation intact her bilateral upper and lower extremites. No pronator drift. Finger to nose intact bilaterally. Speech is clear and coherent.  Skin: Skin is warm and dry. No rash noted. She is not diaphoretic. No erythema. No pallor.  Psychiatric: She has a normal mood and affect. Her behavior is normal.  Nursing note and vitals reviewed.   ED Course  Procedures (including critical care time) Labs Review Labs Reviewed  COMPREHENSIVE METABOLIC PANEL - Abnormal; Notable for the following:    Chloride 97 (*)    Glucose, Bld 132 (*)    Total Bilirubin 1.7 (*)    All other components within normal limits  CBC WITH DIFFERENTIAL/PLATELET - Abnormal; Notable for the following:    RBC 5.54 (*)    Hemoglobin 16.7 (*)    HCT 49.6 (*)    All other components within normal limits  URINALYSIS, ROUTINE W REFLEX MICROSCOPIC (NOT AT [facility]) - Abnormal; Notable for the following:    Hgb urine dipstick SMALL (*)    Ketones, ur 15 (*)    Protein, ur >300 (*)    All other components within normal limits  URINE MICROSCOPIC-ADD ON - Abnormal; Notable for the following:    Squamous Epithelial / LPF 0-5 (*)    Bacteria, UA FEW (*)    All other components within normal limits  URINE  CULTURE  I-STAT CG4 LACTIC ACID, ED    Imaging Review Ct Head Wo Contrast  08/31/2015  CLINICAL DATA:  Intermittent confusion; hypertension EXAM: CT HEAD WITHOUT CONTRAST TECHNIQUE: Contiguous axial images were obtained from the base of the skull through the vertex without intravenous contrast. COMPARISON:  Kyren 10, 2015 FINDINGS: Mild diffuse atrophy is stable. There is no intracranial mass, hemorrhage, extra-axial fluid collection, or midline shift. There is small vessel disease throughout much of the centra semiovale bilaterally. There is evidence of a prior small lacunar type infarct in the left thalamus. No new gray-white compartment lesions are identified. No acute infarct is evident. The bony calvarium appears intact. The mastoid air cells are clear. There is extensive opacification in the maxillary antra bilaterally. There is extensive postoperative change in the paranasal sinuses. No intraorbital lesions are identified. IMPRESSION: Stable atrophy with periventricular small vessel disease. Prior small infarct left thalamus. No acute infarct evident. No hemorrhage or mass effect. Extensive postoperative change in the paranasal sinus regions with extensive opacification in the maxillary antra bilaterally, increased from prior study. Electronically Signed   By: Bretta Bang III M.D.   On: 08/31/2015 14:33   I have personally reviewed and evaluated these images and lab results as part of my medical decision-making.   EKG Interpretation   Date/Time:  Tuesday August 31 2015 12:05:53 EST Ventricular Rate:  78 PR Interval:  158 QRS Duration: 125 QT Interval:  440 QTC Calculation: 501 R Axis:   -112 Text Interpretation:  Sinus rhythm Atrial premature complexes LAE,  consider biatrial enlargement Right bundle branch block Anterior infarct,  old Confirmed by Fayrene Fearing  MD, MARK ([id number]) on 08/31/2015 3:37:59 PM     Filed Vitals:   08/31/15 1515 08/31/15 1530 08/31/15  1600 08/31/15 1620  BP:  232/88 223/99 204/96 220/91  Pulse: 77 74 80 71  Temp:      TempSrc:      Resp: Height:      Weight:      SpO2: 98% 95% 97% 99%    MDM   Meds given in ED:  Medications  sodium chloride 0.9 % bolus 500 mL (0 mLs Intravenous Stopped 08/31/15 1431)  fosfomycin (MONUROL) packet 3 g (3 g Oral Given 08/31/15 1614)  carvedilol (COREG) tablet 12.5 mg (12.5 mg Oral Given 08/31/15 1615)    Discharge Medication List as of 08/31/2015  3:37 PM      Final diagnoses:  UTI (lower urinary tract infection)   This is a 81 y.o. female  With history of ischemic cardio myopathy, CHF, hypertension, hyperthyroidism, coronary artery disease, COPD, CHF, and asthma who presents to the emergency department with her husband from  An assisted living facility who reports the patient has been more confused recently. The patient's husband reports over the past 3 days the patient has been confused and not responding with appropriate answers. He reports that he found her  Yesterday confused about taking the dogs out for a walk. The patient reports she is just generally not feeling herself. They deny any falls. They deny any fevers, chest pain, shortness of breath, coughing, wheezing, abdominal pain, nausea, vomiting, diarrhea.   on exam the patient is afebrile nontoxic appearing. Her abdomen is soft and nontender to palpation. Her lungs were clear to auscultation bilaterally. She is hypertensive with a blood pressure of 220/90. She has not taken her blood pressure medicines today. She has no focal neurological deficits. She is alert and oriented 3.  Head CT shows no acute findings. She is a normal lactic acid. CMP and CBC are generally unremarkable. Urinalysis indicates small hemoglobin with 6-30 white blood cells and few bacteria. Urine sent for culture. Questionable urinary tract infection. Will treat with fosfomycin in the ED and provide her with Coreg here. I advised she still needs to take her valsartan and  when she gets home.  Patient is able to ambulate  In the room with steady gait. We'll discharge with strict return precautions and close follow-up by primary care. I advised that if her confusion continues this could be some dementia and she will need further testing by primary care. Also discussed signs and symptoms of a stroke and encouraged him to return immediately if they see any of these signs or symptoms. I advised the patient to follow-up with their primary care provider this week. I advised the patient to return to the emergency department with new or worsening symptoms or new concerns. The patient verbalized understanding and agreement with plan.    This patient was discussed with Dr. Fayrene Fearing who agrees with assessment and plan.  Everlene Farrier, PA-C 08/31/15 1800  Rolland Porter, MD 09/08/15 [phone]

## 2015-08-31 NOTE — ED Notes (Signed)
[facility]-pt. Coming from assisted living c/o generalized weakness that started today. Pt. sts she has lost ~10lbs in the last week. Pt. Reports she didn't take her blood pressure meds this morning because she wasn't feeling well. EMS palpated pressure . Pt. CBG 110 en route. Pt. Denies N/V/D/Chills. Pt has ICD on left chest.

## 2015-08-31 NOTE — Discharge Instructions (Signed)
Please take your home medication Valsartan 80 mg when you get home today.  Urinary Tract Infection Urinary tract infections (UTIs) can develop anywhere along your urinary tract. Your urinary tract is your body's drainage system for removing wastes and extra water. Your urinary tract includes two kidneys, two ureters, a bladder, and a urethra. Your kidneys are a pair of bean-shaped organs. Each kidney is about the size of your fist. They are located below your ribs, one on each side of your spine. CAUSES Infections are caused by microbes, which are microscopic organisms, including fungi, viruses, and bacteria. These organisms are so small that they can only be seen through a microscope. Bacteria are the microbes that most commonly cause UTIs. SYMPTOMS  Symptoms of UTIs may vary by age and gender of the patient and by the location of the infection. Symptoms in young women typically include a frequent and intense urge to urinate and a painful, burning feeling in the bladder or urethra during urination. Older women and men are more likely to be tired, shaky, and weak and have muscle aches and abdominal pain. A fever may mean the infection is in your kidneys. Other symptoms of a kidney infection include pain in your back or sides below the ribs, nausea, and vomiting. DIAGNOSIS To diagnose a UTI, your caregiver will ask you about your symptoms. Your caregiver will also ask you to provide a urine sample. The urine sample will be tested for bacteria and white blood cells. White blood cells are made by your body to help fight infection. TREATMENT  Typically, UTIs can be treated with medication. Because most UTIs are caused by a bacterial infection, they usually can be treated with the use of antibiotics. The choice of antibiotic and length of treatment depend on your symptoms and the type of bacteria causing your infection. HOME CARE INSTRUCTIONS  If you were prescribed antibiotics, take them exactly as your  caregiver instructs you. Finish the medication even if you feel better after you have only taken some of the medication.  Drink enough water and fluids to keep your urine clear or pale yellow.  Avoid caffeine, tea, and carbonated beverages. They tend to irritate your bladder.  Empty your bladder often. Avoid holding urine for long periods of time.  Empty your bladder before and after sexual intercourse.  After a bowel movement, women should cleanse from front to back. Use each tissue only once. SEEK MEDICAL CARE IF:   You have back pain.  You develop a fever.  Your symptoms do not begin to resolve within 3 days. SEEK IMMEDIATE MEDICAL CARE IF:   You have severe back pain or lower abdominal pain.  You develop chills.  You have nausea or vomiting.  You have continued burning or discomfort with urination. MAKE SURE YOU:   Understand these instructions.  Will watch your condition.  Will get help right away if you are not doing well or get worse.   This information is not intended to replace advice given to you by your health care provider. Make sure you discuss any questions you have with your health care provider.   Document Released: 06/21/2005 Document Revised: 06/02/2015 Document Reviewed: 10/20/2011 Elsevier Interactive Patient Education 2016 [employer] Possible Dementia Dementia is a general term for problems with brain function. A person with dementia has memory loss and a hard time with at least one other brain function such as thinking, speaking, or problem solving. Dementia can affect social functioning, how you do your job,  your mood, or your personality. The changes may be hidden for a long time. The earliest forms of this disease are usually not detected by family or friends. Dementia can be:  Irreversible.  Potentially reversible.  Partially reversible.  Progressive. This means it can get worse over time. CAUSES  Irreversible dementia causes may  include:  Degeneration of brain cells (Alzheimer disease or Lewy body dementia).  Multiple small strokes (vascular dementia).  Infection (chronic meningitis or Creutzfeldt-Jakob disease).  Frontotemporal dementia. This affects younger people, age 83 to 26, compared to those who have Alzheimer disease.  Dementia associated with other disorders like Parkinson disease, Huntington disease, or HIV-associated dementia. Potentially or partially reversible dementia causes may include:  Medicines.  Metabolic causes such as excessive alcohol intake, vitamin B12 deficiency, or thyroid disease.  Masses or pressure in the brain such as a tumor, blood clot, or hydrocephalus. SIGNS AND SYMPTOMS  Symptoms are often hard to detect. Family members or coworkers may not notice them early in the disease process. Different people with dementia may have different symptoms. Symptoms can include:  A hard time with memory, especially recent memory. Long-term memory may not be impaired.  Asking the same question multiple times or forgetting something someone just said.  A hard time speaking your thoughts or finding certain words.  A hard time solving problems or performing familiar tasks (such as how to use a telephone).  Sudden changes in mood.  Changes in personality, especially increasing moodiness or mistrust.  Depression.  A hard time understanding complex ideas that were never a problem in the past. DIAGNOSIS  There are no specific tests for dementia.   Your health care provider may recommend a thorough evaluation. This is because some forms of dementia can be reversible. The evaluation will likely include a physical exam and getting a detailed history from you and a family member. The history often gives the best clues and suggestions for a diagnosis.  Memory testing may be done. A detailed brain function evaluation called neuropsychologic testing may be helpful.  Lab tests and brain imaging  (such as a CT scan or MRI scan) are sometimes important.  Sometimes observation and re-evaluation over time is very helpful. TREATMENT  Treatment depends on the cause.   If the problem is a vitamin deficiency, it may be helped or cured with supplements.  For dementias such as Alzheimer disease, medicines are available to stabilize or slow the course of the disease. There are no cures for this type of dementia.  Your health care provider can help direct you to groups, organizations, and other health care providers to help with decisions in the care of you or your loved one. HOME CARE INSTRUCTIONS The care of individuals with dementia is varied and dependent upon the progression of the dementia. The following suggestions are intended for the person living with, or caring for, the person with dementia.  Create a safe environment.  Remove the locks on bathroom doors to prevent the person from accidentally locking himself or herself in.  Use childproof latches on kitchen cabinets and any place where cleaning supplies, chemicals, or alcohol are kept.  Use childproof covers in unused electrical outlets.  Install childproof devices to keep doors and windows secured.  Remove stove knobs or install safety knobs and an automatic shut-off on the stove.  Lower the temperature on water heaters.  Label medicines and keep them locked up.  Secure knives, lighters, matches, power tools, and guns, and keep these items out  of reach.  Keep the house free from clutter. Remove rugs or anything that might contribute to a fall.  Remove objects that might break and hurt the person.  Make sure lighting is good, both inside and outside.  Install grab rails as needed.  Use a monitoring device to alert you to falls or other needs for help.  Reduce confusion.  Keep familiar objects and people around.  Use night lights or dim lights at night.  Label items or areas.  Use reminders, notes, or  directions for daily activities or tasks.  Keep a simple, consistent routine for waking, meals, bathing, dressing, and bedtime.  Create a calm, quiet environment.  Place large clocks and calendars prominently.  Display emergency numbers and home address near all telephones.  Use cues to establish different times of the day. An example is to open curtains to let the natural light in during the day.   Use effective communication.  Choose simple words and short sentences.  Use a gentle, calm tone of voice.  Be careful not to interrupt.  If the person is struggling to find a word or communicate a thought, try to provide the word or thought.  Ask one question at a time. Allow the person ample time to answer questions. Repeat the question again if the person does not respond.  Reduce nighttime restlessness.  Provide a comfortable bed.  Have a consistent nighttime routine.  Ensure a regular walking or physical activity schedule. Involve the person in daily activities as much as possible.  Limit napping during the day.  Limit caffeine.  Attend social events that stimulate rather than overwhelm the senses.  Encourage good nutrition and hydration.  Reduce distractions during meal times and snacks.  Avoid foods that are too hot or too cold.  Monitor chewing and swallowing ability.  Continue with routine vision, hearing, dental, and medical screenings.  Give medicines only as directed by the health care provider.  Monitor driving abilities. Do not allow the person to drive when safe driving is no longer possible.  Register with an identification program which could provide location assistance in the event of a missing person situation. SEEK MEDICAL CARE IF:   New behavioral problems start such as moodiness, aggressiveness, or seeing things that are not there (hallucinations).  Any new problem with brain function happens. This includes problems with balance, speech, or  falling a lot.  Problems with swallowing develop.  Any symptoms of other illness happen. Small changes or worsening in any aspect of brain function can be a sign that the illness is getting worse. It can also be a sign of another medical illness such as infection. Seeing a health care provider right away is important. SEEK IMMEDIATE MEDICAL CARE IF:   A fever develops.  New or worsened confusion develops.  New or worsened sleepiness develops.  Staying awake becomes hard to do.   This information is not intended to replace advice given to you by your health care provider. Make sure you discuss any questions you have with your health care provider.   Document Released: 03/07/2001 Document Revised: 10/02/2014 Document Reviewed: 02/06/2011 Elsevier Interactive Patient Education [employer]

## 2015-08-31 NOTE — ED Notes (Addendum)
EDP Will at bedside.  

## 2015-09-01 DIAGNOSIS — I10 Essential (primary) hypertension: Secondary | ICD-10-CM | POA: Diagnosis not present

## 2015-09-01 DIAGNOSIS — F0390 Unspecified dementia without behavioral disturbance: Secondary | ICD-10-CM | POA: Diagnosis not present

## 2015-09-01 DIAGNOSIS — R410 Disorientation, unspecified: Secondary | ICD-10-CM | POA: Diagnosis not present

## 2015-09-02 LAB — URINE CULTURE

## 2015-09-03 DIAGNOSIS — N390 Urinary tract infection, site not specified: Secondary | ICD-10-CM | POA: Diagnosis not present

## 2015-09-03 DIAGNOSIS — D649 Anemia, unspecified: Secondary | ICD-10-CM | POA: Diagnosis not present

## 2015-09-03 DIAGNOSIS — I10 Essential (primary) hypertension: Secondary | ICD-10-CM | POA: Diagnosis not present

## 2015-09-03 DIAGNOSIS — R6889 Other general symptoms and signs: Secondary | ICD-10-CM | POA: Diagnosis not present

## 2015-09-03 DIAGNOSIS — E785 Hyperlipidemia, unspecified: Secondary | ICD-10-CM | POA: Diagnosis not present

## 2015-09-06 DIAGNOSIS — R488 Other symbolic dysfunctions: Secondary | ICD-10-CM | POA: Diagnosis not present

## 2015-09-06 DIAGNOSIS — R2689 Other abnormalities of gait and mobility: Secondary | ICD-10-CM | POA: Diagnosis not present

## 2015-09-06 DIAGNOSIS — R4182 Altered mental status, unspecified: Secondary | ICD-10-CM | POA: Diagnosis not present

## 2015-09-06 DIAGNOSIS — I10 Essential (primary) hypertension: Secondary | ICD-10-CM | POA: Diagnosis not present

## 2015-09-07 DIAGNOSIS — R4182 Altered mental status, unspecified: Secondary | ICD-10-CM | POA: Diagnosis not present

## 2015-09-07 DIAGNOSIS — R2689 Other abnormalities of gait and mobility: Secondary | ICD-10-CM | POA: Diagnosis not present

## 2015-09-07 DIAGNOSIS — R488 Other symbolic dysfunctions: Secondary | ICD-10-CM | POA: Diagnosis not present

## 2015-09-07 DIAGNOSIS — I10 Essential (primary) hypertension: Secondary | ICD-10-CM | POA: Diagnosis not present

## 2015-09-08 DIAGNOSIS — R410 Disorientation, unspecified: Secondary | ICD-10-CM | POA: Diagnosis not present

## 2015-09-08 DIAGNOSIS — I10 Essential (primary) hypertension: Secondary | ICD-10-CM | POA: Diagnosis not present

## 2015-09-08 DIAGNOSIS — R488 Other symbolic dysfunctions: Secondary | ICD-10-CM | POA: Diagnosis not present

## 2015-09-08 DIAGNOSIS — N390 Urinary tract infection, site not specified: Secondary | ICD-10-CM | POA: Diagnosis not present

## 2015-09-08 DIAGNOSIS — R4182 Altered mental status, unspecified: Secondary | ICD-10-CM | POA: Diagnosis not present

## 2015-09-08 DIAGNOSIS — R2689 Other abnormalities of gait and mobility: Secondary | ICD-10-CM | POA: Diagnosis not present

## 2015-09-13 DIAGNOSIS — R41841 Cognitive communication deficit: Secondary | ICD-10-CM | POA: Diagnosis not present

## 2015-09-13 DIAGNOSIS — R4182 Altered mental status, unspecified: Secondary | ICD-10-CM | POA: Diagnosis not present

## 2015-09-15 DIAGNOSIS — R41841 Cognitive communication deficit: Secondary | ICD-10-CM | POA: Diagnosis not present

## 2015-09-15 DIAGNOSIS — R4182 Altered mental status, unspecified: Secondary | ICD-10-CM | POA: Diagnosis not present

## 2015-09-24 ENCOUNTER — Observation Stay (HOSPITAL_COMMUNITY)
Admission: EM | Admit: 2015-09-24 | Discharge: 2015-09-25 | Disposition: A | Discharge status: Home / Self Care | Payer: Medicare Other | Attending: Internal Medicine | Admitting: Internal Medicine

## 2015-09-24 ENCOUNTER — Encounter (HOSPITAL_COMMUNITY): Payer: Self-pay | Admitting: Emergency Medicine

## 2015-09-24 ENCOUNTER — Emergency Department (HOSPITAL_COMMUNITY): Payer: Medicare Other

## 2015-09-24 DIAGNOSIS — Z9114 Patient's other noncompliance with medication regimen: Secondary | ICD-10-CM | POA: Diagnosis not present

## 2015-09-24 DIAGNOSIS — Z95810 Presence of automatic (implantable) cardiac defibrillator: Secondary | ICD-10-CM | POA: Diagnosis not present

## 2015-09-24 DIAGNOSIS — J449 Chronic obstructive pulmonary disease, unspecified: Secondary | ICD-10-CM | POA: Insufficient documentation

## 2015-09-24 DIAGNOSIS — K219 Gastro-esophageal reflux disease without esophagitis: Secondary | ICD-10-CM | POA: Insufficient documentation

## 2015-09-24 DIAGNOSIS — E46 Unspecified protein-calorie malnutrition: Secondary | ICD-10-CM | POA: Diagnosis not present

## 2015-09-24 DIAGNOSIS — F03A0 Unspecified dementia, mild, without behavioral disturbance, psychotic disturbance, mood disturbance, and anxiety: Secondary | ICD-10-CM | POA: Insufficient documentation

## 2015-09-24 DIAGNOSIS — T68XXXA Hypothermia, initial encounter: Secondary | ICD-10-CM | POA: Diagnosis not present

## 2015-09-24 DIAGNOSIS — J45909 Unspecified asthma, uncomplicated: Secondary | ICD-10-CM | POA: Insufficient documentation

## 2015-09-24 DIAGNOSIS — F0390 Unspecified dementia without behavioral disturbance: Secondary | ICD-10-CM | POA: Insufficient documentation

## 2015-09-24 DIAGNOSIS — R531 Weakness: Secondary | ICD-10-CM | POA: Diagnosis not present

## 2015-09-24 DIAGNOSIS — Z96652 Presence of left artificial knee joint: Secondary | ICD-10-CM | POA: Insufficient documentation

## 2015-09-24 DIAGNOSIS — Z9049 Acquired absence of other specified parts of digestive tract: Secondary | ICD-10-CM | POA: Insufficient documentation

## 2015-09-24 DIAGNOSIS — I2510 Atherosclerotic heart disease of native coronary artery without angina pectoris: Secondary | ICD-10-CM | POA: Diagnosis not present

## 2015-09-24 DIAGNOSIS — I255 Ischemic cardiomyopathy: Secondary | ICD-10-CM | POA: Diagnosis not present

## 2015-09-24 DIAGNOSIS — E039 Hypothyroidism, unspecified: Secondary | ICD-10-CM | POA: Diagnosis not present

## 2015-09-24 DIAGNOSIS — R404 Transient alteration of awareness: Secondary | ICD-10-CM | POA: Diagnosis not present

## 2015-09-24 DIAGNOSIS — I7300 Raynaud's syndrome without gangrene: Secondary | ICD-10-CM | POA: Diagnosis not present

## 2015-09-24 DIAGNOSIS — I454 Nonspecific intraventricular block: Secondary | ICD-10-CM | POA: Insufficient documentation

## 2015-09-24 DIAGNOSIS — I10 Essential (primary) hypertension: Secondary | ICD-10-CM | POA: Diagnosis not present

## 2015-09-24 DIAGNOSIS — Z79899 Other long term (current) drug therapy: Secondary | ICD-10-CM | POA: Insufficient documentation

## 2015-09-24 DIAGNOSIS — I422 Other hypertrophic cardiomyopathy: Secondary | ICD-10-CM | POA: Diagnosis not present

## 2015-09-24 DIAGNOSIS — I160 Hypertensive urgency: Secondary | ICD-10-CM | POA: Diagnosis not present

## 2015-09-24 DIAGNOSIS — Z90710 Acquired absence of both cervix and uterus: Secondary | ICD-10-CM | POA: Diagnosis not present

## 2015-09-24 DIAGNOSIS — R112 Nausea with vomiting, unspecified: Secondary | ICD-10-CM | POA: Diagnosis not present

## 2015-09-24 DIAGNOSIS — I5022 Chronic systolic (congestive) heart failure: Secondary | ICD-10-CM | POA: Diagnosis not present

## 2015-09-24 DIAGNOSIS — I509 Heart failure, unspecified: Secondary | ICD-10-CM | POA: Diagnosis not present

## 2015-09-24 DIAGNOSIS — Z681 Body mass index (BMI) 19 or less, adult: Secondary | ICD-10-CM | POA: Diagnosis not present

## 2015-09-24 DIAGNOSIS — M1990 Unspecified osteoarthritis, unspecified site: Secondary | ICD-10-CM | POA: Diagnosis not present

## 2015-09-24 DIAGNOSIS — I110 Hypertensive heart disease with heart failure: Secondary | ICD-10-CM | POA: Insufficient documentation

## 2015-09-24 DIAGNOSIS — X58XXXA Exposure to other specified factors, initial encounter: Secondary | ICD-10-CM | POA: Diagnosis not present

## 2015-09-24 DIAGNOSIS — Z951 Presence of aortocoronary bypass graft: Secondary | ICD-10-CM | POA: Diagnosis not present

## 2015-09-24 DIAGNOSIS — E785 Hyperlipidemia, unspecified: Secondary | ICD-10-CM | POA: Diagnosis not present

## 2015-09-24 LAB — LIPASE, BLOOD: LIPASE: 28 U/L (ref 11–51)

## 2015-09-24 LAB — CBC WITH DIFFERENTIAL/PLATELET
Basophils Absolute: 0 10*3/uL (ref 0.0–0.1)
Basophils Relative: 0 %
EOS PCT: 1 %
Eosinophils Absolute: 0.1 10*3/uL (ref 0.0–0.7)
HCT: 48.1 % — ABNORMAL HIGH (ref 36.0–46.0)
Hemoglobin: 16 g/dL — ABNORMAL HIGH (ref 12.0–15.0)
LYMPHS ABS: 1.4 10*3/uL (ref 0.7–4.0)
LYMPHS PCT: 16 %
MCH: 30 pg (ref 26.0–34.0)
MCHC: 33.3 g/dL (ref 30.0–36.0)
MCV: 90.2 fL (ref 78.0–100.0)
MONO ABS: 0.5 10*3/uL (ref 0.1–1.0)
MONOS PCT: 5 %
Neutro Abs: 7.1 10*3/uL (ref 1.7–7.7)
Neutrophils Relative %: 78 %
PLATELETS: 195 10*3/uL (ref 150–400)
RBC: 5.33 MIL/uL — ABNORMAL HIGH (ref 3.87–5.11)
RDW: 14 % (ref 11.5–15.5)
WBC: 9.1 10*3/uL (ref 4.0–10.5)

## 2015-09-24 LAB — URINE MICROSCOPIC-ADD ON
BACTERIA UA: NONE SEEN
Squamous Epithelial / LPF: NONE SEEN

## 2015-09-24 LAB — URINALYSIS, ROUTINE W REFLEX MICROSCOPIC
BILIRUBIN URINE: NEGATIVE
GLUCOSE, UA: 100 mg/dL — AB
HGB URINE DIPSTICK: NEGATIVE
Ketones, ur: NEGATIVE mg/dL
Leukocytes, UA: NEGATIVE
Nitrite: NEGATIVE
Protein, ur: 100 mg/dL — AB
SPECIFIC GRAVITY, URINE: 1.011 (ref 1.005–1.030)
pH: 7.5 (ref 5.0–8.0)

## 2015-09-24 LAB — COMPREHENSIVE METABOLIC PANEL
ALT: 17 U/L (ref 14–54)
AST: 30 U/L (ref 15–41)
Albumin: 4.7 g/dL (ref 3.5–5.0)
Alkaline Phosphatase: 69 U/L (ref 38–126)
Anion gap: 10 (ref 5–15)
BUN: 18 mg/dL (ref 6–20)
CHLORIDE: 96 mmol/L — AB (ref 101–111)
CO2: 28 mmol/L (ref 22–32)
CREATININE: 0.73 mg/dL (ref 0.44–1.00)
Calcium: 10.1 mg/dL (ref 8.9–10.3)
GFR calc non Af Amer: >60 mL/min (ref >60)
Glucose, Bld: 168 mg/dL — ABNORMAL HIGH (ref 65–99)
POTASSIUM: 4.2 mmol/L (ref 3.5–5.1)
Sodium: 134 mmol/L — ABNORMAL LOW (ref 135–145)
TOTAL PROTEIN: 8.9 g/dL — AB (ref 6.5–8.1)
Total Bilirubin: 1.7 mg/dL — ABNORMAL HIGH (ref 0.3–1.2)

## 2015-09-24 LAB — I-STAT CG4 LACTIC ACID, ED: Lactic Acid, Venous: 1.4 mmol/L (ref 0.5–2.0)

## 2015-09-24 LAB — TROPONIN I

## 2015-09-24 LAB — PROTIME-INR
INR: 0.92 (ref 0.00–1.49)
PROTHROMBIN TIME: 12.6 s (ref 11.6–15.2)

## 2015-09-24 LAB — TSH: TSH: 5.384 u[IU]/mL — AB (ref 0.350–4.500)

## 2015-09-24 MED ORDER — CARVEDILOL 12.5 MG PO TABS
12.5000 mg | ORAL_TABLET | Freq: Two times a day (BID) | ORAL | Status: DC
  Administered 2015-09-24 – 2015-09-25 (×2): 12.5 mg via ORAL
  Filled 2015-09-24 (×4): qty 1

## 2015-09-24 MED ORDER — IRBESARTAN 75 MG PO TABS
75.0000 mg | ORAL_TABLET | Freq: Every day | ORAL | Status: DC
  Administered 2015-09-24 – 2015-09-25 (×2): 75 mg via ORAL
  Filled 2015-09-24 (×3): qty 1

## 2015-09-24 MED ORDER — IRBESARTAN 75 MG PO TABS
75.0000 mg | ORAL_TABLET | Freq: Once | ORAL | Status: AC
  Administered 2015-09-24: 75 mg via ORAL
  Filled 2015-09-24: qty 1

## 2015-09-24 MED ORDER — IRBESARTAN 75 MG PO TABS: 75.0000 mg | ORAL_TABLET | Freq: Every day | ORAL | Status: DC

## 2015-09-24 MED ORDER — HYDRALAZINE HCL 20 MG/ML IJ SOLN
10.0000 mg | INTRAMUSCULAR | Status: DC | PRN
  Administered 2015-09-25: 10 mg via INTRAVENOUS
  Filled 2015-09-24: qty 1

## 2015-09-24 MED ORDER — SODIUM CHLORIDE 0.9 % IV BOLUS (SEPSIS)
1000.0000 mL | Freq: Once | INTRAVENOUS | Status: AC
  Administered 2015-09-24: 1000 mL via INTRAVENOUS

## 2015-09-24 NOTE — ED Provider Notes (Addendum)
Medical screening examination/treatment/procedure(s) were conducted as a shared visit with non-physician practitioner(s) and myself.  I personally evaluated the patient during the encounter.   EKG Interpretation   Date/Time:  Friday September 24 2015 17:22:48 EST Ventricular Rate:  69 PR Interval:  159 QRS Duration: 143 QT Interval:  497 QTC Calculation: 532 R Axis:   -77 Text Interpretation:  Atrial-ventricular dual-paced complexes No further  analysis attempted due to paced rhythm Prior EKG without paced rhythm  Confirmed by Jannat Rosemeyer MD, Annabelle Harman ([id number]) on 09/24/2015 5:25:67 PM      81 year old female with history of ischemic cardiomyopathy status post CABG, AICD, and pacemaker, COPD, hypertension, and hypothyroidism who presents to emergency department with nausea, vomiting, and feeling unwell this morning. History is provided by the patient's husband this patient is unable to remember full details of what had happened. He states that she became very flushed and hot this morning, and started taking off her shirt during breakfast. Vomited once shortly afterwards. No fevers, chills, chest pain, difficulty breathing, cough, abdominal pain, headache, diarrhea, or urinary symptoms. On arrival is hypothermic, and initially placed in a bair hugger. She has otherwise unremarkable physical exam. Is hemodynamically stable. Infectious workup revealing no source of an infection in the setting of her hypothermia. Her TSH is elevated, and there is concern from her husband that she may not be getting her thyroid medications. Possible hypothyroidism may play a role in her abnormal temperature today.  Not a clear etiology of that today. Is also hypertensive with systolic blood pressure in the 200s. He is noncompliant with her antihypertensive medications, and with her home medications blood pressure is improving. Discussed with Dr. Robb Matar who will admit given unclear etiology of her hypothermia.  Lavera Guise,  MD 09/24/15 [id number]  Lavera Guise, MD 09/24/15 [phone]

## 2015-09-24 NOTE — ED Notes (Signed)
Patient transported to X-ray 

## 2015-09-24 NOTE — ED Provider Notes (Signed)
CSN: [id number]     Arrival date & time 09/24/15  1535 History   First MD Initiated Contact with Patient 09/24/15 1636     Chief Complaint  Patient presents with  . Hypertension  . Emesis  . Nausea   HPI   [name] is a 81 y.o. F PMH significant for ischemic cardiomyopathy, CHF (LVEF 75-80% , hypertension, hyperthyroidism, coronary artery disease, COPD, CHF, and asthma BIB EMS for N/V that occurred hours PTA. Her CC is per the husband, present at bedside, but the patient denies N/V. She states she became "really hot" PTA, and is unsure of why or for how long. She denies being exposed to cold water or other sources that could cause hypothermia. She denies fevers, chills, CP, SOB, abdominal pain, changes in bowel/bladder habits.   Past Medical History  Diagnosis Date  . Ischemic cardiomyopathy     severe. Left ventricular ejection fraction 20%.   . Chronic systolic heart failure (HCC)     NYHA class II.  [name] Chronic pulmonary disease   . BBB (bundle branch block)     s/p BiV ICD implant  . Raynaud's syndrome   . Neuromuscular scoliosis of thoracolumbar region     type of scoliosis was not specified.   [name] DJD (degenerative joint disease), cervical   . DJD (degenerative joint disease), lumbar   . HTN (hypertension)   . Hyperthyroidism     following Graves disease  . Renal artery stenosis (HCC)     Treated with angioplast in 1980 and 1987.   . S/P CABG (coronary artery bypass graft) April 2012  . FH: mitral valve repair     with 26 mm Edwards ring angioplasty,   . COPD (chronic obstructive pulmonary disease) (HCC)   . Full dentures   . PONV (postoperative nausea and vomiting)   . CHF (congestive heart failure) (HCC)   . Dizziness   . Automatic implantable cardioverter-defibrillator in situ   . Pacemaker   . Asthma   . Pneumonia     hx  . GERD (gastroesophageal reflux disease)   . Anemia     takes iron 3 days per week  . Hx of cardiovascular stress test     Lexiscan  Myoview (9/15):  Normal stress nuclear study.  LV Ejection Fraction: 76%   Past Surgical History  Procedure Laterality Date  . Total abdominal hysterectomy    . Sympathectomy    . Tonsillectomy    . Renal artery ballon dilation    . Rotator cuff repair      right and left  . Laminotomy/foraminotomy      with decompression of the L4 nerve root   . Cervical fusion      C5-6 and C6-7, C4-5 with titanium plates  . Umbilical hernia repair    . Wedge resection  2001    for the right upper lobe for Aspergillus treatement.  Dr. Edwyna Shell apprix 2001.  [name] Ptca      of bilateral renal arteries  . Cataract extraction    . Carpal tunnel release    . Appendectomy    . Breast lumpectomy      left breast  . Renal artery ballon dilation      x2  . Precancerous growth      tops of ear removed. bilateral.   . Implantation of icd  2010    BiV ICD implant (SJM) by Dr Amil Amen 03/2009  . Repair extensor tendon  07/10/2012  Procedure: REPAIR EXTENSOR TENDON;  Surgeon: Nicki Reaper, MD;  Location: [facility];  Service: Orthopedics;  Laterality: Right;  METACARPAL PHALANGEAL REPLACEMENT ARTHROPLASTIES RIGHT INDEX, MIDDLE, AND RING FINGERS    . Finger arthroplasty  07/10/2012    Procedure: FINGER ARTHROPLASTY;  Surgeon: Nicki Reaper, MD;  Location: [facility];  Service: Orthopedics;  Laterality: Right;  METACARPAL PHALANGEAL ARTHROPLASTIES RIGHT INDEX, MIDDLE, AND RING FINGERS   . Esophageal manometry N/A 04/07/2013    Procedure: ESOPHAGEAL MANOMETRY (EM);  Surgeon: Charolett Bumpers, MD;  Location: [facility];  Service: Endoscopy;  Laterality: N/A;  . Carpometacarpel suspension plasty Right 11/12/2013    Procedure: SUSPENSION PLASTY RIGHT THUMB, TRAPEZIUM EXCISION;  Surgeon: Nicki Reaper, MD;  Location: [facility];  Service: Orthopedics;  Laterality: Right;  . Tendon transfer Right 11/12/2013    Procedure: RIGHT ABDUCTOR POLLICUS LONGUS TENDON TRANSFER;   Surgeon: Nicki Reaper, MD;  Location: [facility];  Service: Orthopedics;  Laterality: Right;  . Tonsillectomy    . Hemorrhoidectomy with hemorrhoid banding    . Facial cosmetic surgery    . Back surgery    . Eye surgery Bilateral     cataracts  . Hernia repair      umbilical  . Lung mass removal Right   . Left knee arthroscopic    . Coronary artery bypass graft  2010    mvr/cabg  . Total knee arthroplasty Left 04/29/2014    Procedure: LEFT TOTAL KNEE ARTHROPLASTY;  Surgeon: Nadara Mustard, MD;  Location: [facility];  Service: Orthopedics;  Laterality: Left;  . Femur im nail Left 09/29/2014    Procedure: Affixus Trochanteric Femoral Nail;  Surgeon: Eldred Manges, MD;  Location: [facility];  Service: Orthopedics;  Laterality: Left;  . Biv icd genertaor change out N/A 11/11/2014    Procedure: BIV ICD GENERTAOR CHANGE OUT;  Surgeon: Hillis Range, MD;  Location: [facility];  Service: Cardiovascular;  Laterality: N/A;   Family History  Problem Relation Age of Onset  . Heart disease Father   . Hypertension Father   . Colon cancer      grandmother   Social History  Substance Use Topics  . Smoking status: Never Smoker   . Smokeless tobacco: None     Comment: passive smoker from birth to age 48 (mom and husband)  . Alcohol Use: No   OB History    No data available     Review of Systems  Ten systems are reviewed and are negative for acute change except as noted in the HPI  Allergies  Alprazolam; Doxycycline; Fulvicin p-g; Hydrocodone; Ketoconazole; Serevent; Calcitonin (salmon); Morphine and related; Amitriptyline; Cefuroxime axetil; Hydrocodone-acetaminophen; Lorcet; Other; Sulfamethoxazole-trimethoprim; Sulfonamide derivatives; Trovan; Ventolin; Aspirin; Cephalexin; Ciprofloxacin; Clonazepam; Erythromycin; Hydromorphone; Miacalcin; Oxycodone-aspirin; Penicillins; and Tapazole  Home Medications   Prior to Admission medications   Medication Sig Start Date End Date Taking?  Authorizing Provider  acetaminophen (TYLENOL) 500 MG tablet Take 500 mg by mouth every 6 (six) hours as needed for mild pain or moderate pain.   Yes Historical Provider, MD  carvedilol (COREG) 12.5 MG tablet Take 1 tablet (12.5 mg total) by mouth 2 (two) times daily. 04/01/14  Yes Jake Bathe, MD  Cholecalciferol (VITAMIN D3) 2000 UNITS capsule Take 2,000 Units by mouth daily.    Yes Historical Provider, MD  Coenzyme Q10 (COQ10) 100 MG CAPS Take 100 mg by mouth daily.    Yes Historical Provider, MD  fluconazole (DIFLUCAN) 100 MG tablet Take 100 mg by mouth once a week. Sundays   Yes Historical Provider, MD  levothyroxine (SYNTHROID, LEVOTHROID) 75 MCG tablet Take 75 mcg by mouth daily before breakfast.   Yes Historical Provider, MD  nortriptyline (PAMELOR) 25 MG capsule Take 25 mg by mouth at bedtime.   Yes Historical Provider, MD  pantoprazole (PROTONIX) 40 MG tablet Take 40 mg by mouth daily.    Yes Historical Provider, MD  Potassium Gluconate 595 MG CAPS Take 1 capsule by mouth daily.   Yes Historical Provider, MD  valsartan (DIOVAN) 160 MG tablet Take 160 mg by mouth daily.   Yes Historical Provider, MD  diphenhydrAMINE (BENADRYL) 25 mg capsule Take 1 capsule (25 mg total) by mouth every 6 (six) hours as needed. Patient not taking: Reported on 08/13/2015 11/12/14   Dwana Melena, PA-C  feeding supplement, RESOURCE BREEZE, (RESOURCE BREEZE) LIQD Take 1 Container by mouth 3 (three) times daily between meals. Patient not taking: Reported on 08/13/2015 10/03/14   Kathlen Mody, MD  montelukast (SINGULAIR) 10 MG tablet Take 10 mg by mouth daily.     Historical Provider, MD  NAMZARIC 28-10 MG CP24 Take 1 capsule by mouth daily. 08/30/15   Historical Provider, MD  Probiotic Product (PROBIOTIC DAILY PO) Take 1 capsule by mouth daily.    Historical Provider, MD  traMADol (ULTRAM) 50 MG tablet Take 1 tablet (50 mg total) by mouth every 6 (six) hours as needed. Patient not taking: Reported on 08/13/2015  10/03/14   Kathlen Mody, MD  valsartan (DIOVAN) 80 MG tablet Take 1 tablet (80 mg total) by mouth daily. Patient not taking: Reported on 09/24/2015 11/17/14   Kelle Darting Hager, PA-C   BP 235/102 mmHg  Pulse 65  Temp(Src) 96.9 F (36.1 C) (Rectal)  Resp 20  SpO2 99% Physical Exam  Constitutional: She is oriented to person, place, and time. She appears well-developed and well-nourished. No distress.  HENT:  Head: Normocephalic and atraumatic.  Mouth/Throat: No oropharyngeal exudate.  Oropharynx dry  Eyes: Conjunctivae are normal. Pupils are equal, round, and reactive to light. Right eye exhibits no discharge. Left eye exhibits no discharge. No scleral icterus.  Neck: Normal range of motion. No tracheal deviation present.  Cardiovascular: Normal rate, regular rhythm, normal heart sounds and intact distal pulses.  Exam reveals no gallop and no friction rub.   No murmur heard. Pulmonary/Chest: Effort normal and breath sounds normal. No respiratory distress. She has no wheezes. She has no rales. She exhibits no tenderness.  Abdominal: Soft. Bowel sounds are normal. She exhibits no distension and no mass. There is no tenderness. There is no rebound and no guarding.  Musculoskeletal: Normal range of motion. She exhibits no edema or tenderness.  Lymphadenopathy:    She has no cervical adenopathy.  Neurological: She is alert and oriented to person, place, and time. No cranial nerve deficit. Coordination normal.  Skin: Skin is warm and dry. No rash noted. She is not diaphoretic. No erythema.  Psychiatric: She has a normal mood and affect. Her behavior is normal.  Nursing note and vitals reviewed.   ED Course  Procedures (including critical care time) Labs Review Labs Reviewed  CBC WITH DIFFERENTIAL/PLATELET - Abnormal; Notable for the following:    RBC 5.33 (*)    Hemoglobin 16.0 (*)    HCT 48.1 (*)    All other components within normal limits  COMPREHENSIVE METABOLIC PANEL - Abnormal; Notable  for the following:    Sodium  134 (*)    Chloride 96 (*)    Glucose, Bld 168 (*)    Total Protein 8.9 (*)    Total Bilirubin 1.7 (*)    All other components within normal limits  URINALYSIS, ROUTINE W REFLEX MICROSCOPIC (NOT AT [facility]) - Abnormal; Notable for the following:    Glucose, UA 100 (*)    Protein, ur 100 (*)    All other components within normal limits  TSH - Abnormal; Notable for the following:    TSH 5.384 (*)    All other components within normal limits  CULTURE, BLOOD (ROUTINE X 2)  LIPASE, BLOOD  PROTIME-INR  TROPONIN I  URINE MICROSCOPIC-ADD ON  T4, FREE  VITAMIN B1  I-STAT CG4 LACTIC ACID, ED    Imaging Review Dg Chest 2 View  09/24/2015  CLINICAL DATA:  Hypothermia. Confusion. History of ischemic cardiomyopathy. CHF. COPD. Hypertension. EXAM: CHEST  2 VIEW COMPARISON:  09/30/2014 FINDINGS: Mild pectus excavatum deformity. Lower cervical spine fixation. Pacer/AICD device. Left rotator cuff repair. Midline trachea. Mild cardiomegaly. Atherosclerosis in the transverse aorta. No pleural effusion or pneumothorax. Diffuse peribronchial thickening. No lobar consolidation. IMPRESSION: No acute cardiopulmonary disease. Mild cardiomegaly, without congestive failure. Electronically Signed   By: Jeronimo Greaves M.D.   On: 09/24/2015 18:18   I have personally reviewed and evaluated these images and lab results as part of my medical decision-making.   EKG Interpretation   Date/Time:  Friday September 24 2015 17:22:48 EST Ventricular Rate:  69 PR Interval:  159 QRS Duration: 143 QT Interval:  497 QTC Calculation: 532 R Axis:   -77 Text Interpretation:  Atrial-ventricular dual-paced complexes No further  analysis attempted due to paced rhythm Prior EKG without paced rhythm  Confirmed by LIU MD, Annabelle Harman ([id number]) on 09/24/2015 5:25:16 PM      MDM   Final diagnoses:  Hypothermia, initial encounter  Essential hypertension   Patient non-toxic appearing. Hypertensive but did  not take her HTN medication today. Hypothermia of 47F via rectal temp. Will order bair hugger and reassess. Although neuro exam unremarkable, and patient oriented x 3, I am unsure of patient's cognitive abilities.  Patient refusing bair hugger- patient's temperature is now 96.45F.  I went to update the patient, and she stated that she has not seen a MD. I reassured her that she had just had a conversation with Dr. Verdie Mosher, the attending physician. She said that she does not remember the conversation. Per record review, she has been seen in the past for confusion, with a negative head CT earlier this month. Lactic acid, EKG, CXR, UA, CBC, CMP, lipase, INR, troponin unremarkable for acute changes. TSH elevated at 5.384.  Patient will need to be admitted for persistent hypothermia despite attempts at warming. No signs of infection. Possible worsening hypothyroidism but T4 is currently pending. BP reduction with home meds.  Dr. Robb Matar agrees to admission. He spoke with patient who did not want to be admitted and was arguing on the phone with her husband.  I called her husband, and explained the risks of untreated hypothermia, and he wants the patient to be admitted. After speaking with the patient again, she agrees with admission. Patient may be safely admitted.  Melton Krebs, PA-C 09/24/15 2357  Lavera Guise, MD 09/25/15 [phone]

## 2015-09-24 NOTE — H&P (Signed)
[facility] History and Physical  Harleyquinn I Winfield GNF:[id number] DOB: 08-Jan-1933 DOA: 09/24/2015  Referring physician: Melton Krebs, PA-C PCP: Pearla Dubonnet, MD   Chief Complaint: High blood pressure, nausea and emesis.  HPI: [name] is a 81 y.o. female with a past medical history of hypertension, CAD, hyperlipidemia, hypothyroidism who comes from [facility] nursing home facility with several episodes of nausea and emesis since the morning. She denies abdominal pain, diarrhea, melena or hematochezia. Nausea resolved with Zofran IV given by EMS.   When seen in the emergency department, the patient was in no acute distress and denied any complaints at that time. Her systolic blood pressure numbers have been occasionally over 200 and diastolic over 100 mm/Hg for several readings. She denies headache, blurred vision, motor or sensorial deficits. chest pain, palpitations, dizziness, diaphoresis, pitting edema lower extremities, PND or orthopnea.   She has stated that she wanted to go home, but agreed to stay due to elevated blood pressure.   Review of Systems:  Constitutional:  No weight loss, night sweats, Fevers, chills, fatigue.  HEENT:  No headaches, Difficulty swallowing,Tooth/dental problems,Sore throat,  No sneezing, itching, ear ache, nasal congestion, post nasal drip,  Cardio-vascular:  No chest pain, Orthopnea, PND, swelling in lower extremities, anasarca, dizziness, palpitations  GI:  Positive nausea, vomiting. No heartburn, indigestion, abdominal pain, diarrhea, change in bowel habits, loss of appetite  Resp:  No shortness of breath with exertion or at rest. No excess mucus, no productive cough, No non-productive cough, No coughing up of blood.No change in color of mucus.No wheezing.No chest wall deformity  Skin:  no rash or lesions.  GU:  no dysuria, change in color of urine, no urgency or frequency. No flank pain.  Musculoskeletal:  No joint pain  or swelling. No decreased range of motion. No back pain.  Psych:  No change in mood or affect. No depression or anxiety. No memory loss.   Past Medical History  Diagnosis Date  . Ischemic cardiomyopathy     severe. Left ventricular ejection fraction 20%.   . Chronic systolic heart failure (HCC)     NYHA class II.  [name] Chronic pulmonary disease   . BBB (bundle branch block)     s/p BiV ICD implant  . Raynaud's syndrome   . Neuromuscular scoliosis of thoracolumbar region     type of scoliosis was not specified.   [name] DJD (degenerative joint disease), cervical   . DJD (degenerative joint disease), lumbar   . HTN (hypertension)   . Hyperthyroidism     following Graves disease  . Renal artery stenosis (HCC)     Treated with angioplast in 1980 and 1987.   . S/P CABG (coronary artery bypass graft) April 2012  . FH: mitral valve repair     with 26 mm Edwards ring angioplasty,   . COPD (chronic obstructive pulmonary disease) (HCC)   . Full dentures   . PONV (postoperative nausea and vomiting)   . CHF (congestive heart failure) (HCC)   . Dizziness   . Automatic implantable cardioverter-defibrillator in situ   . Pacemaker   . Asthma   . Pneumonia     hx  . GERD (gastroesophageal reflux disease)   . Anemia     takes iron 3 days per week  . Hx of cardiovascular stress test     Lexiscan Myoview (9/15):  Normal stress nuclear study.  LV Ejection Fraction: 76%   Past Surgical History  Procedure Laterality Date  .  Total abdominal hysterectomy    . Sympathectomy    . Tonsillectomy    . Renal artery ballon dilation    . Rotator cuff repair      right and left  . Laminotomy/foraminotomy      with decompression of the L4 nerve root   . Cervical fusion      C5-6 and C6-7, C4-5 with titanium plates  . Umbilical hernia repair    . Wedge resection  2001    for the right upper lobe for Aspergillus treatement.  Dr. Edwyna Shell apprix 2001.  [name] Ptca      of bilateral renal arteries  . Cataract  extraction    . Carpal tunnel release    . Appendectomy    . Breast lumpectomy      left breast  . Renal artery ballon dilation      x2  . Precancerous growth      tops of ear removed. bilateral.   . Implantation of icd  2010    BiV ICD implant (SJM) by Dr Amil Amen 03/2009  . Repair extensor tendon  07/10/2012    Procedure: REPAIR EXTENSOR TENDON;  Surgeon: Nicki Reaper, MD;  Location: [facility];  Service: Orthopedics;  Laterality: Right;  METACARPAL PHALANGEAL REPLACEMENT ARTHROPLASTIES RIGHT INDEX, MIDDLE, AND RING FINGERS    . Finger arthroplasty  07/10/2012    Procedure: FINGER ARTHROPLASTY;  Surgeon: Nicki Reaper, MD;  Location: [facility];  Service: Orthopedics;  Laterality: Right;  METACARPAL PHALANGEAL ARTHROPLASTIES RIGHT INDEX, MIDDLE, AND RING FINGERS   . Esophageal manometry N/A 04/07/2013    Procedure: ESOPHAGEAL MANOMETRY (EM);  Surgeon: Charolett Bumpers, MD;  Location: [facility];  Service: Endoscopy;  Laterality: N/A;  . Carpometacarpel suspension plasty Right 11/12/2013    Procedure: SUSPENSION PLASTY RIGHT THUMB, TRAPEZIUM EXCISION;  Surgeon: Nicki Reaper, MD;  Location: [facility];  Service: Orthopedics;  Laterality: Right;  . Tendon transfer Right 11/12/2013    Procedure: RIGHT ABDUCTOR POLLICUS LONGUS TENDON TRANSFER;  Surgeon: Nicki Reaper, MD;  Location: [facility];  Service: Orthopedics;  Laterality: Right;  . Tonsillectomy    . Hemorrhoidectomy with hemorrhoid banding    . Facial cosmetic surgery    . Back surgery    . Eye surgery Bilateral     cataracts  . Hernia repair      umbilical  . Lung mass removal Right   . Left knee arthroscopic    . Coronary artery bypass graft  2010    mvr/cabg  . Total knee arthroplasty Left 04/29/2014    Procedure: LEFT TOTAL KNEE ARTHROPLASTY;  Surgeon: Nadara Mustard, MD;  Location: [facility];  Service: Orthopedics;  Laterality: Left;  . Femur im nail Left 09/29/2014     Procedure: Affixus Trochanteric Femoral Nail;  Surgeon: Eldred Manges, MD;  Location: [facility];  Service: Orthopedics;  Laterality: Left;  . Biv icd genertaor change out N/A 11/11/2014    Procedure: BIV ICD GENERTAOR CHANGE OUT;  Surgeon: Hillis Range, MD;  Location: [facility] C[facility];  Service: Cardiovascular;  Laterality: N/A;   Social History:  reports that she has never smoked. She does not have any smokeless tobacco history on file. She reports that she does not drink alcohol. Her drug history is not on file.  Allergies  Allergen Reactions  . Alprazolam Other (See Comments)    REACTION: ulcer's in mouth and extreme constipation  . Doxycycline Other (See Comments)  REACTION: severe rash over entire body  . Fulvicin P-G [Griseofulvin] Other (See Comments)    Severe headaches  . Hydrocodone Other (See Comments)    REACTION: nausea and totally out of it  . Ketoconazole Other (See Comments)    REACTION: terribly weak, voice shook  . Serevent [Salmeterol] Other (See Comments)    shaking  . Calcitonin (Salmon) Other (See Comments)    REACTION: rash over entire body  . Morphine And Related Nausea And Vomiting  . Amitriptyline     unknown  . Cefuroxime Axetil Other (See Comments)    REACTION: either rash or diarrhea  . Hydrocodone-Acetaminophen Other (See Comments)    REACTION: reaction forgotten. Lorcet, percodan  . Lorcet [Hydrocodone-Acetaminophen] Other (See Comments)    Reaction forgotten  . Other     Ketacosol= terribly week  . Sulfamethoxazole-Trimethoprim Other (See Comments)    REACTION: either rash or diarrhea  . Sulfonamide Derivatives Other (See Comments)    REACTION: reaction forgotten  . Trovan [Alatrofloxacin] Nausea And Vomiting  . Ventolin [Albuterol] Other (See Comments)    shaky  . Aspirin Other (See Comments)    REACTION: upsets stomach  . Cephalexin Rash  . Ciprofloxacin Rash  . Clonazepam Other (See Comments)    REACTION: 1/2 pill makes grogginess next day  .  Erythromycin Rash  . Hydromorphone Rash  . Miacalcin [Calcitonin (Salmon)] Rash  . Oxycodone-Aspirin Other (See Comments)    REACTION: nausea  . Penicillins Rash  . Tapazole [Methimazole] Rash    Family History  Problem Relation Age of Onset  . Heart disease Father   . Hypertension Father   . Colon cancer      grandmother    Prior to Admission medications   Medication Sig Start Date End Date Taking? Authorizing Provider  acetaminophen (TYLENOL) 500 MG tablet Take 500 mg by mouth every 6 (six) hours as needed for mild pain or moderate pain.   Yes Historical Provider, MD  carvedilol (COREG) 12.5 MG tablet Take 1 tablet (12.5 mg total) by mouth 2 (two) times daily. 04/01/14  Yes Jake Bathe, MD  Cholecalciferol (VITAMIN D3) 2000 UNITS capsule Take 2,000 Units by mouth daily.    Yes Historical Provider, MD  Coenzyme Q10 (COQ10) 100 MG CAPS Take 100 mg by mouth daily.    Yes Historical Provider, MD  fluconazole (DIFLUCAN) 100 MG tablet Take 100 mg by mouth once a week. Sundays   Yes Historical Provider, MD  levothyroxine (SYNTHROID, LEVOTHROID) 75 MCG tablet Take 75 mcg by mouth daily before breakfast.   Yes Historical Provider, MD  montelukast (SINGULAIR) 10 MG tablet Take 10 mg by mouth daily.    Yes Historical Provider, MD  NAMZARIC 28-10 MG CP24 Take 1 capsule by mouth daily. 08/30/15  Yes Historical Provider, MD  nortriptyline (PAMELOR) 25 MG capsule Take 25 mg by mouth at bedtime.   Yes Historical Provider, MD  pantoprazole (PROTONIX) 40 MG tablet Take 40 mg by mouth daily.    Yes Historical Provider, MD  Potassium Gluconate 595 MG CAPS Take 1 capsule by mouth daily.   Yes Historical Provider, MD  Probiotic Product (PROBIOTIC DAILY PO) Take 1 capsule by mouth daily.   Yes Historical Provider, MD  valsartan (DIOVAN) 160 MG tablet Take 160 mg by mouth daily. Reported on 09/24/2015   Yes Historical Provider, MD  diphenhydrAMINE (BENADRYL) 25 mg capsule Take 1 capsule (25 mg total) by  mouth every 6 (six) hours as needed. Patient not taking: Reported  on 08/13/2015 11/12/14   Dwana Melena, PA-C  feeding supplement, RESOURCE BREEZE, (RESOURCE BREEZE) LIQD Take 1 Container by mouth 3 (three) times daily between meals. Patient not taking: Reported on 08/13/2015 10/03/14   Kathlen Mody, MD  traMADol (ULTRAM) 50 MG tablet Take 1 tablet (50 mg total) by mouth every 6 (six) hours as needed. Patient not taking: Reported on 08/13/2015 10/03/14   Kathlen Mody, MD  valsartan (DIOVAN) 80 MG tablet Take 1 tablet (80 mg total) by mouth daily. Patient not taking: Reported on 09/24/2015 11/17/14   Dwana Melena, PA-C   Physical Exam: Filed Vitals:   09/24/15 2130 09/24/15 2228 09/24/15 2230 09/24/15 2300  BP: 172/111 134/105 160/103 220/110  Pulse: 67 66 71 67  Temp:      TempSrc:      Resp: SpO2: 99%  100% 100%    Wt Readings from Last 3 Encounters:  08/31/15 42.638 kg (94 lb)  12/15/14 48.535 kg (107 lb)  11/12/14 50.44 kg (111 lb 3.2 oz)    General:  Appears calm and comfortable Eyes: PERRL, normal lids, irises & conjunctiva ENT: grossly normal hearing, lips & tongue Neck: no LAD, masses or thyromegaly Cardiovascular: RRR, no m/r/g. No LE edema. Telemetry: SR, no arrhythmias  Respiratory: CTA bilaterally, no w/r/r. Normal respiratory effort. Abdomen: soft, ntnd Skin: no rash or induration seen on limited exam Musculoskeletal: grossly normal tone BUE/BLE Psychiatric: grossly normal mood and affect, speech fluent and appropriate Neurologic: Awake, alert, oriented3, grossly non-focal.           Labs on Admission:  Basic Metabolic Panel:  Recent Labs Lab 09/24/15 1708  NA 134*  K 4.2  CL 96*  CO2 28  GLUCOSE 168*  BUN 18  CREATININE 0.73  CALCIUM 10.1   Liver Function Tests:  Recent Labs Lab 09/24/15 1708  AST 30  ALT 17  ALKPHOS 69  BILITOT 1.7*  PROT 8.9*  ALBUMIN 4.7    Recent Labs Lab 09/24/15 1708  LIPASE 28   CBC:  Recent  Labs Lab 09/24/15 1708  WBC 9.1  NEUTROABS 7.1  HGB 16.0*  HCT 48.1*  MCV 90.2  PLT 195   Cardiac Enzymes:  Recent Labs Lab 09/24/15 1708  TROPONINI <0.03     Radiological Exams on Admission: Dg Chest 2 View  09/24/2015  CLINICAL DATA:  Hypothermia. Confusion. History of ischemic cardiomyopathy. CHF. COPD. Hypertension. EXAM: CHEST  2 VIEW COMPARISON:  09/30/2014 FINDINGS: Mild pectus excavatum deformity. Lower cervical spine fixation. Pacer/AICD device. Left rotator cuff repair. Midline trachea. Mild cardiomegaly. Atherosclerosis in the transverse aorta. No pleural effusion or pneumothorax. Diffuse peribronchial thickening. No lobar consolidation. IMPRESSION: No acute cardiopulmonary disease. Mild cardiomegaly, without congestive failure. Electronically Signed   By: Jeronimo Greaves M.D.   On: 09/24/2015 18:18    EKG: Independently reviewed Vent. rate 69 BPM PR interval 159 ms QRS duration 143 ms QT/QTc 497/532 ms P-R-T axes -1 -77 76 Atrial-ventricular dual-paced complexes No further analysis attempted due to paced rhythm   Assessment/Plan Principal Problem:   Hypertensive urgency   Essential hypertension Admit to telemetry. Continue angiotensin receptor blocker. Continue carvedilol 12.5 mg by mouth twice a day. Start Amlodipine 2.5 mg by mouth daily. Follow-up blood pressure and adjust treatment as needed.  Active Problems:   Hyperlipidemia Continue lifestyle modifications.    S/P CABG (coronary artery bypass graft) Continue beta blocker. Unsure if the patient is allergic to aspirin or other antiplatelet agents since  she shows an allergy to Percodan.    COPD (chronic obstructive pulmonary disease) (HCC) As symptomatic. Bronchodilators as needed.    Hypothyroidism Continue levothyroxine     Code Status: Full code. DVT Prophylaxis: Lovenox SQ. Family Communication:  Disposition Plan: Admit to telemetry for blood pressure control.  Time spent: Over 70  minutes were spent in the process of his admission.  Bobette Mo [facility] Pager [phone].

## 2015-09-24 NOTE — ED Notes (Signed)
GOING TO COLLECT 2ND SET OF CULTURES AFTER PT HAS FLUID ON BOARD

## 2015-09-24 NOTE — ED Notes (Signed)
Per EMS picked up from [facility] facility with nausea and vomiting starting a few hours ago. Denies diarrhea, pain, SOB, CP, fever/chills  4 zofran IV given in route, no longer feels nauseous

## 2015-09-25 ENCOUNTER — Encounter (HOSPITAL_COMMUNITY): Payer: Self-pay

## 2015-09-25 DIAGNOSIS — F0390 Unspecified dementia without behavioral disturbance: Secondary | ICD-10-CM

## 2015-09-25 DIAGNOSIS — E039 Hypothyroidism, unspecified: Secondary | ICD-10-CM

## 2015-09-25 DIAGNOSIS — I10 Essential (primary) hypertension: Secondary | ICD-10-CM

## 2015-09-25 DIAGNOSIS — I160 Hypertensive urgency: Secondary | ICD-10-CM | POA: Diagnosis not present

## 2015-09-25 DIAGNOSIS — R112 Nausea with vomiting, unspecified: Secondary | ICD-10-CM

## 2015-09-25 DIAGNOSIS — F03A0 Unspecified dementia, mild, without behavioral disturbance, psychotic disturbance, mood disturbance, and anxiety: Secondary | ICD-10-CM | POA: Insufficient documentation

## 2015-09-25 LAB — CBC
HEMATOCRIT: 44.4 % (ref 36.0–46.0)
HEMOGLOBIN: 14.7 g/dL (ref 12.0–15.0)
MCH: 29.8 pg (ref 26.0–34.0)
MCHC: 33.1 g/dL (ref 30.0–36.0)
MCV: 89.9 fL (ref 78.0–100.0)
Platelets: 210 10*3/uL (ref 150–400)
RBC: 4.94 MIL/uL (ref 3.87–5.11)
RDW: 14.1 % (ref 11.5–15.5)
WBC: 7.1 10*3/uL (ref 4.0–10.5)

## 2015-09-25 LAB — COMPREHENSIVE METABOLIC PANEL
ALBUMIN: 4 g/dL (ref 3.5–5.0)
ALK PHOS: 64 U/L (ref 38–126)
ALT: 14 U/L (ref 14–54)
ANION GAP: 10 (ref 5–15)
AST: 24 U/L (ref 15–41)
BILIRUBIN TOTAL: 1.4 mg/dL — AB (ref 0.3–1.2)
BUN: 22 mg/dL — ABNORMAL HIGH (ref 6–20)
CALCIUM: 9.5 mg/dL (ref 8.9–10.3)
CO2: 28 mmol/L (ref 22–32)
Chloride: 97 mmol/L — ABNORMAL LOW (ref 101–111)
Creatinine, Ser: 1.11 mg/dL — ABNORMAL HIGH (ref 0.44–1.00)
GFR, EST AFRICAN AMERICAN: 53 mL/min — AB (ref >60)
GFR, EST NON AFRICAN AMERICAN: 46 mL/min — AB (ref >60)
GLUCOSE: 113 mg/dL — AB (ref 65–99)
POTASSIUM: 4.3 mmol/L (ref 3.5–5.1)
Sodium: 135 mmol/L (ref 135–145)
TOTAL PROTEIN: 7.6 g/dL (ref 6.5–8.1)

## 2015-09-25 LAB — T4, FREE: FREE T4: 1.18 ng/dL — AB (ref 0.61–1.12)

## 2015-09-25 MED ORDER — LEVOTHYROXINE SODIUM 75 MCG PO TABS
75.0000 ug | ORAL_TABLET | Freq: Every day | ORAL | Status: DC
  Administered 2015-09-25: 75 ug via ORAL
  Filled 2015-09-25: qty 1

## 2015-09-25 MED ORDER — AMLODIPINE BESYLATE 5 MG PO TABS: 5.0000 mg | ORAL_TABLET | Freq: Every day | ORAL | Status: DC

## 2015-09-25 MED ORDER — SODIUM CHLORIDE 0.9 % IJ SOLN
3.0000 mL | Freq: Two times a day (BID) | INTRAMUSCULAR | Status: DC
  Administered 2015-09-25: 3 mL via INTRAVENOUS

## 2015-09-25 MED ORDER — POTASSIUM GLUCONATE ER 595 MG PO TBCR
1.0000 | EXTENDED_RELEASE_TABLET | Freq: Every day | ORAL | Status: DC
  Administered 2015-09-25: 595 mg via ORAL
  Filled 2015-09-25: qty 1

## 2015-09-25 MED ORDER — PANTOPRAZOLE SODIUM 40 MG PO TBEC: 40.0000 mg | DELAYED_RELEASE_TABLET | Freq: Every day | ORAL | Status: DC

## 2015-09-25 MED ORDER — MEMANTINE HCL-DONEPEZIL HCL ER 28-10 MG PO CP24: 1.0000 | ORAL_CAPSULE | Freq: Every day | ORAL | Status: DC

## 2015-09-25 MED ORDER — ONDANSETRON HCL 4 MG/2ML IJ SOLN: 4.0000 mg | Freq: Four times a day (QID) | INTRAMUSCULAR | Status: DC | PRN

## 2015-09-25 MED ORDER — ACETAMINOPHEN 325 MG PO TABS
650.0000 mg | ORAL_TABLET | Freq: Four times a day (QID) | ORAL | Status: DC | PRN
Start: 2015-09-25 — End: 2015-09-25

## 2015-09-25 MED ORDER — AMLODIPINE BESYLATE 2.5 MG PO TABS
2.5000 mg | ORAL_TABLET | Freq: Every day | ORAL | Status: DC
  Administered 2015-09-25: 2.5 mg via ORAL
  Filled 2015-09-25: qty 1

## 2015-09-25 MED ORDER — NORTRIPTYLINE HCL 25 MG PO CAPS: 25.0000 mg | ORAL_CAPSULE | Freq: Every day | ORAL | Status: DC

## 2015-09-25 MED ORDER — ENOXAPARIN SODIUM 30 MG/0.3ML ~~LOC~~ SOLN
30.0000 mg | Freq: Every day | SUBCUTANEOUS | Status: DC
  Filled 2015-09-25 (×2): qty 0.3

## 2015-09-25 MED ORDER — IRBESARTAN 150 MG PO TABS
150.0000 mg | ORAL_TABLET | Freq: Every day | ORAL | Status: DC
  Administered 2015-09-25: 150 mg via ORAL
  Filled 2015-09-25 (×2): qty 1

## 2015-09-25 MED ORDER — ONDANSETRON HCL 4 MG PO TABS: 4.0000 mg | ORAL_TABLET | Freq: Four times a day (QID) | ORAL | Status: DC | PRN

## 2015-09-25 MED ORDER — MONTELUKAST SODIUM 10 MG PO TABS
10.0000 mg | ORAL_TABLET | Freq: Every day | ORAL | Status: DC
  Administered 2015-09-25: 10 mg via ORAL
  Filled 2015-09-25 (×2): qty 1

## 2015-09-25 MED ORDER — VITAMIN D 1000 UNITS PO TABS
2000.0000 [IU] | ORAL_TABLET | Freq: Every day | ORAL | Status: DC
  Administered 2015-09-25: 2000 [IU] via ORAL
  Filled 2015-09-25 (×2): qty 2

## 2015-09-25 MED ORDER — DONEPEZIL HCL 10 MG PO TABS
10.0000 mg | ORAL_TABLET | Freq: Every day | ORAL | Status: DC
  Administered 2015-09-25: 10 mg via ORAL
  Filled 2015-09-25 (×2): qty 1

## 2015-09-25 MED ORDER — MEMANTINE HCL ER 28 MG PO CP24
28.0000 mg | ORAL_CAPSULE | Freq: Every day | ORAL | Status: DC
  Administered 2015-09-25: 28 mg via ORAL
  Filled 2015-09-25 (×2): qty 1

## 2015-09-25 MED ORDER — ACETAMINOPHEN 650 MG RE SUPP: 650.0000 mg | Freq: Four times a day (QID) | RECTAL | Status: DC | PRN

## 2015-09-25 NOTE — Discharge Instructions (Signed)
Managing Your High Blood Pressure °Blood pressure is a measurement of how forceful your blood is pressing against the walls of the arteries. Arteries are muscular tubes within the circulatory system. Blood pressure does not stay the same. Blood pressure rises when you are active, excited, or nervous; and it lowers during sleep and relaxation. If the numbers measuring your blood pressure stay above normal most of the time, you are at risk for health problems. High blood pressure (hypertension) is a long-term (chronic) condition in which blood pressure is elevated. °A blood pressure reading is recorded as two numbers, such as 120 over 80 (or 120/80). The first, higher number is called the systolic pressure. It is a measure of the pressure in your arteries as the heart beats. The second, lower number is called the diastolic pressure. It is a measure of the pressure in your arteries as the heart relaxes between beats.  °Keeping your blood pressure in a normal range is important to your overall health and prevention of health problems, such as heart disease and stroke. When your blood pressure is uncontrolled, your heart has to work harder than normal. High blood pressure is a very common condition in adults because blood pressure tends to rise with age. Men and women are equally likely to have hypertension but at different times in life. Before age 45, men are more likely to have hypertension. After 79 years of age, women are more likely to have it. Hypertension is especially common in African Americans. This condition often has no signs or symptoms. The cause of the condition is usually not known. Your caregiver can help you come up with a plan to keep your blood pressure in a normal, healthy range. °BLOOD PRESSURE STAGES °Blood pressure is classified into four stages: normal, prehypertension, stage 1, and stage 2. Your blood pressure reading will be used to determine what type of treatment, if any, is necessary.  Appropriate treatment options are tied to these four stages:  °Normal °· Systolic pressure (mm Hg): below 120. °· Diastolic pressure (mm Hg): below 80. °Prehypertension °· Systolic pressure (mm Hg): 120 to 139. °· Diastolic pressure (mm Hg): 80 to 89. °Stage 1 °· Systolic pressure (mm Hg): 140 to 159. °· Diastolic pressure (mm Hg): 90 to 99. °Stage 2 °· Systolic pressure (mm Hg): 160 or above. °· Diastolic pressure (mm Hg): 100 or above. °RISKS RELATED TO HIGH BLOOD PRESSURE °Managing your blood pressure is an important responsibility. Uncontrolled high blood pressure can lead to: °· A heart attack. °· A stroke. °· A weakened blood vessel (aneurysm). °· Heart failure. °· Kidney damage. °· Eye damage. °· Metabolic syndrome. °· Memory and concentration problems. °HOW TO MANAGE YOUR BLOOD PRESSURE °Blood pressure can be managed effectively with lifestyle changes and medicines (if needed). Your caregiver will help you come up with a plan to bring your blood pressure within a normal range. Your plan should include the following: °Education °· Read all information provided by your caregivers about how to control blood pressure. °· Educate yourself on the latest guidelines and treatment recommendations. New research is always being done to further define the risks and treatments for high blood pressure. °Lifestyle changes °· Control your weight. °· Avoid smoking. °· Stay physically active. °· Reduce the amount of salt in your diet. °· Reduce stress. °· Control any chronic conditions, such as high cholesterol or diabetes. °· Reduce your alcohol intake. °Medicines °· Several medicines (antihypertensive medicines) are available, if needed, to bring blood pressure within a normal range. °  Communication °· Review all the medicines you take with your caregiver because there may be side effects or interactions. °· Talk with your caregiver about your diet, exercise habits, and other lifestyle factors that may be contributing to  high blood pressure. °· See your caregiver regularly. Your caregiver can help you create and adjust your plan for managing high blood pressure. °RECOMMENDATIONS FOR TREATMENT AND FOLLOW-UP  °The following recommendations are based on current guidelines for managing high blood pressure in nonpregnant adults. Use these recommendations to identify the proper follow-up period or treatment option based on your blood pressure reading. You can discuss these options with your caregiver. °· Systolic pressure of 120 to 139 or diastolic pressure of 80 to 89: Follow up with your caregiver as directed. °· Systolic pressure of 140 to 160 or diastolic pressure of 90 to 100: Follow up with your caregiver within 2 months. °· Systolic pressure above 160 or diastolic pressure above 100: Follow up with your caregiver within 1 month. °· Systolic pressure above 180 or diastolic pressure above 110: Consider antihypertensive therapy; follow up with your caregiver within 1 week. °· Systolic pressure above 200 or diastolic pressure above 120: Begin antihypertensive therapy; follow up with your caregiver within 1 week. °  °This information is not intended to replace advice given to you by your health care provider. Make sure you discuss any questions you have with your health care provider. °  °Document Released: 06/05/2012 Document Reviewed: 06/05/2012 °Elsevier Interactive Patient Education ©2016 [employer] ° °

## 2015-09-25 NOTE — ED Notes (Signed)
Pt continues to have ectopy and non capture of pacemaker. [facility] pacemaker interrogated, will fax results.

## 2015-09-25 NOTE — Discharge Summary (Signed)
Physician Discharge Summary  [name] PQZ:[id number] DOB: May 19, 1933 DOA: 09/24/2015  PCP: Pearla Dubonnet, MD  Admit date: 09/24/2015 Discharge date: 09/25/2015  Time spent: 25 minutes  Recommendations for Outpatient Follow-up:  1. Discharge home with outpt PCP follow up in 1 week   Discharge Diagnoses:  Principal Problem:   Hypertensive urgency   Active Problems:   Hyperlipidemia   Essential hypertension   S/P CABG (coronary artery bypass graft)   COPD (chronic obstructive pulmonary disease) (HCC)   Hypothyroidism   Nausea and vomiting   Discharge Condition: fair  Diet recommendation: heart healthy/ low sodium  Filed Weights   09/25/15 0626  Weight: 44.044 kg (97 lb 1.6 oz)    History of present illness:  81 year old female with multiple medical history including hypertension, CAD, hyperlipidemia, hypothyroidism, mild dementia, chronic systolic CHF, history of bundle branch block status post biventricular ICD, DJD, neuromuscular scoliosis, hypothyroidism, CAD with history of CABG, COPD, GERD, and multiple medication allergies who lives at [facility] with her husband was brought to the ED with several episodes of nausea and vomiting on the day of admission. She denied any abdominal pain, fevers, chills, chest pain, shortness of breath, diarrhea, melena or hematemesis. Her nausea and vomiting resolved after receiving Zofran given by EMS. In the ED she was found to have hypertensive urgency with pressure as high as 268/165 mmHg. Patient denied any dizziness, orthopnea, blurred vision, leg swellings. She was also mildly hypothermic. Reportedly did not take her blood pressure on the day of admission. Blood work done was unremarkable except for mildly elevated TSH of 5.38. EKG was normal except for mildly elevated QTC. No signs of infection. Troponin was negative. Patient admitted to hospitalist service.  Hospital Course:  Hypertensive urgency No  clear cause of what triggered her symptoms. Husband reports that her blood pressure remains fairly stable at home. Resumed her Coreg at home dose. Switched Diovan to losartan. Started on low-dose amlodipine. Systolic blood pressure in 170s this morning. I have increased  amlodipine to 5 mg daily. She has mildly worsened creatinine so I was not comfortable increasing her Diovan dose. Patient stable on monitor except for occasional PVCs. Notified that her pacemaker was interrogated. Patient is hemodynamically stable and can be discharged home. -I will add amlodipine 5 mg daily in addition to her other home blood pressure medications. Instructed patient and her husband with follow-up with her PCP in one week.  Hypothermia Mild on presentation. Underlying signs of symptoms of sepsis. Now resolved. Blood cultures so far negative. UA unremarkable with normal chest x-ray.   Nausea and vomiting No clear etiology. Resolved right ataxia arrived in the ED. They have been due to underlying uncontrolled hypertension.  Hypothyroidism Mildly elevated TSH and free T4. Is on Synthroid at the current dose until she follows up with her PCP.  Chronic CHF/hypertrophic cardiomyopathy Stable. Continue home medications  mild dementia  Protein calorie malnutrition Recommend outpatient nutritionist evaluation.  Patient stable to be discharged home with outpatient PCP follow.    Plan discussed with husband on the phone  CODE STATUS: Full code  Diet: Heart healthy/low sodium   Procedures:  None  Consultations:  None  Discharge Exam: Filed Vitals:   09/25/15 0600 09/25/15 0626  BP: 155/67 174/71  Pulse: 59 65  Temp:  98.1 F (36.7 C)  Resp: 17 18    General: Elderly thin built female not in distress HEENT: No pallor, moist mucosa Chest: Clear to auscultation bilaterally  CVS: Normal  S1 and S2, no murmurs rub or gallop GI: Soft, nondistended, nontender, bowel sounds  present Musculoskeletal: Warm, no edema CNS: Alert and oriented to place and person, has some confusion   Discharge Instructions    Current Discharge Medication List    START taking these medications   Details  amLODipine (NORVASC) 5 MG tablet Take 1 tablet (5 mg total) by mouth daily. Qty: 30 tablet, Refills: 0      CONTINUE these medications which have NOT CHANGED   Details  acetaminophen (TYLENOL) 500 MG tablet Take 500 mg by mouth every 6 (six) hours as needed for mild pain or moderate pain.    carvedilol (COREG) 12.5 MG tablet Take 1 tablet (12.5 mg total) by mouth 2 (two) times daily. Qty: 180 tablet, Refills: 3    Cholecalciferol (VITAMIN D3) 2000 UNITS capsule Take 2,000 Units by mouth daily.     Coenzyme Q10 (COQ10) 100 MG CAPS Take 100 mg by mouth daily.     fluconazole (DIFLUCAN) 100 MG tablet Take 100 mg by mouth once a week. Sundays    levothyroxine (SYNTHROID, LEVOTHROID) 75 MCG tablet Take 75 mcg by mouth daily before breakfast.    montelukast (SINGULAIR) 10 MG tablet Take 10 mg by mouth daily.     NAMZARIC 28-10 MG CP24 Take 1 capsule by mouth daily.    nortriptyline (PAMELOR) 25 MG capsule Take 25 mg by mouth at bedtime.    pantoprazole (PROTONIX) 40 MG tablet Take 40 mg by mouth daily.     Potassium Gluconate 595 MG CAPS Take 1 capsule by mouth daily.    Probiotic Product (PROBIOTIC DAILY PO) Take 1 capsule by mouth daily.    valsartan (DIOVAN) 160 MG tablet Take 160 mg by mouth daily. Reported on 09/24/2015      STOP taking these medications     diphenhydrAMINE (BENADRYL) 25 mg capsule      feeding supplement, RESOURCE BREEZE, (RESOURCE BREEZE) LIQD      traMADol (ULTRAM) 50 MG tablet        Allergies  Allergen Reactions  . Alprazolam Other (See Comments)    REACTION: ulcer's in mouth and extreme constipation  . Doxycycline Other (See Comments)    REACTION: severe rash over entire body  . Fulvicin P-G [Griseofulvin] Other (See  Comments)    Severe headaches  . Hydrocodone Other (See Comments)    REACTION: nausea and totally out of it  . Ketoconazole Other (See Comments)    REACTION: terribly weak, voice shook  . Serevent [Salmeterol] Other (See Comments)    shaking  . Calcitonin (Salmon) Other (See Comments)    REACTION: rash over entire body  . Morphine And Related Nausea And Vomiting  . Amitriptyline     unknown  . Cefuroxime Axetil Other (See Comments)    REACTION: either rash or diarrhea  . Hydrocodone-Acetaminophen Other (See Comments)    REACTION: reaction forgotten. Lorcet, percodan  . Lorcet [Hydrocodone-Acetaminophen] Other (See Comments)    Reaction forgotten  . Other     Ketacosol= terribly week  . Sulfamethoxazole-Trimethoprim Other (See Comments)    REACTION: either rash or diarrhea  . Sulfonamide Derivatives Other (See Comments)    REACTION: reaction forgotten  . Trovan [Alatrofloxacin] Nausea And Vomiting  . Ventolin [Albuterol] Other (See Comments)    shaky  . Aspirin Other (See Comments)    REACTION: upsets stomach  . Cephalexin Rash  . Ciprofloxacin Rash  . Clonazepam Other (See Comments)    REACTION: 1/2 pill  makes grogginess next day  . Erythromycin Rash  . Hydromorphone Rash  . Miacalcin [Calcitonin (Salmon)] Rash  . Oxycodone-Aspirin Other (See Comments)    REACTION: nausea  . Penicillins Rash  . Tapazole [Methimazole] Rash   Follow-up Information    Follow up with GATES,ROBERT NEVILL, MD. Schedule an appointment as soon as possible for a visit in 1 week.   Specialty:  Internal Medicine   Contact information:   [address] [place] [address] [phone]        The results of significant diagnostics from this hospitalization (including imaging, microbiology, ancillary and laboratory) are listed below for reference.    Significant Diagnostic Studies: Dg Chest 2 View  09/24/2015  CLINICAL DATA:  Hypothermia. Confusion. History of ischemic  cardiomyopathy. CHF. COPD. Hypertension. EXAM: CHEST  2 VIEW COMPARISON:  09/30/2014 FINDINGS: Mild pectus excavatum deformity. Lower cervical spine fixation. Pacer/AICD device. Left rotator cuff repair. Midline trachea. Mild cardiomegaly. Atherosclerosis in the transverse aorta. No pleural effusion or pneumothorax. Diffuse peribronchial thickening. No lobar consolidation. IMPRESSION: No acute cardiopulmonary disease. Mild cardiomegaly, without congestive failure. Electronically Signed   By: Jeronimo Greaves M.D.   On: 09/24/2015 18:18   Ct Head Wo Contrast  08/31/2015  CLINICAL DATA:  Intermittent confusion; hypertension EXAM: CT HEAD WITHOUT CONTRAST TECHNIQUE: Contiguous axial images were obtained from the base of the skull through the vertex without intravenous contrast. COMPARISON:  [place] 10, 2015 FINDINGS: Mild diffuse atrophy is stable. There is no intracranial mass, hemorrhage, extra-axial fluid collection, or midline shift. There is small vessel disease throughout much of the centra semiovale bilaterally. There is evidence of a prior small lacunar type infarct in the left thalamus. No new gray-white compartment lesions are identified. No acute infarct is evident. The bony calvarium appears intact. The mastoid air cells are clear. There is extensive opacification in the maxillary antra bilaterally. There is extensive postoperative change in the paranasal sinuses. No intraorbital lesions are identified. IMPRESSION: Stable atrophy with periventricular small vessel disease. Prior small infarct left thalamus. No acute infarct evident. No hemorrhage or mass effect. Extensive postoperative change in the paranasal sinus regions with extensive opacification in the maxillary antra bilaterally, increased from prior study. Electronically Signed   By: Bretta Bang III M.D.   On: 08/31/2015 14:33    Microbiology: Recent Results (from the past 240 hour(s))  Blood culture (routine x 2)     Status: None (Preliminary  result)   Collection Time: 09/24/15  5:12 PM  Result Value Ref Range Status   Specimen Description BLOOD LEFT ANTECUBITAL  Final   Special Requests BOTTLES DRAWN AEROBIC AND ANAEROBIC 5CC EACH  Final   Culture   Final    NO GROWTH < 24 HOURS Performed at [facility]    Report Status PENDING  Incomplete     Labs: Basic Metabolic Panel:  Recent Labs Lab 09/24/15 1708 09/25/15 0717  NA 134* 135  K 4.2 4.3  CL 96* 97*  CO2 28 28  GLUCOSE 168* 113*  BUN 18 22*  CREATININE 0.73 1.11*  CALCIUM 10.1 9.5   Liver Function Tests:  Recent Labs Lab 09/24/15 1708 09/25/15 0717  AST 30 24  ALT 17 14  ALKPHOS 69 64  BILITOT 1.7* 1.4*  PROT 8.9* 7.6  ALBUMIN 4.7 4.0    Recent Labs Lab 09/24/15 1708  LIPASE 28   No results for input(s): AMMONIA in the last 168 hours. CBC:  Recent Labs Lab 09/24/15 1708 09/25/15  0717  WBC 9.1 7.1  NEUTROABS 7.1  --   HGB 16.0* 14.7  HCT 48.1* 44.4  MCV 90.2 89.9  PLT 195 210   Cardiac Enzymes:  Recent Labs Lab 09/24/15 1708  TROPONINI <0.03   BNP: BNP (last 3 results) No results for input(s): BNP in the last 8760 hours.  ProBNP (last 3 results) No results for input(s): PROBNP in the last 8760 hours.  CBG: No results for input(s): GLUCAP in the last 168 hours.     Signed:  Eddie North MD    [facility] 09/25/2015, 9:56 AM

## 2015-09-25 NOTE — Progress Notes (Signed)
Utilization Review Completed.[name] T12/31/2016  

## 2015-09-28 DIAGNOSIS — R531 Weakness: Secondary | ICD-10-CM | POA: Diagnosis not present

## 2015-09-28 DIAGNOSIS — I10 Essential (primary) hypertension: Secondary | ICD-10-CM | POA: Diagnosis not present

## 2015-09-29 DIAGNOSIS — R634 Abnormal weight loss: Secondary | ICD-10-CM | POA: Diagnosis not present

## 2015-09-29 DIAGNOSIS — R627 Adult failure to thrive: Secondary | ICD-10-CM | POA: Diagnosis not present

## 2015-09-29 DIAGNOSIS — R2689 Other abnormalities of gait and mobility: Secondary | ICD-10-CM | POA: Diagnosis not present

## 2015-09-29 DIAGNOSIS — I10 Essential (primary) hypertension: Secondary | ICD-10-CM | POA: Diagnosis not present

## 2015-09-29 DIAGNOSIS — M6281 Muscle weakness (generalized): Secondary | ICD-10-CM | POA: Diagnosis not present

## 2015-09-29 DIAGNOSIS — R4182 Altered mental status, unspecified: Secondary | ICD-10-CM | POA: Diagnosis not present

## 2015-09-29 DIAGNOSIS — R41841 Cognitive communication deficit: Secondary | ICD-10-CM | POA: Diagnosis not present

## 2015-09-29 DIAGNOSIS — I255 Ischemic cardiomyopathy: Secondary | ICD-10-CM | POA: Diagnosis not present

## 2015-09-29 DIAGNOSIS — I2510 Atherosclerotic heart disease of native coronary artery without angina pectoris: Secondary | ICD-10-CM | POA: Diagnosis not present

## 2015-09-29 LAB — CULTURE, BLOOD (ROUTINE X 2): CULTURE: NO GROWTH

## 2015-09-29 LAB — VITAMIN B1: VITAMIN B1 (THIAMINE): 94.5 nmol/L (ref 66.5–200.0)

## 2015-09-30 DIAGNOSIS — M6281 Muscle weakness (generalized): Secondary | ICD-10-CM | POA: Diagnosis not present

## 2015-09-30 DIAGNOSIS — I10 Essential (primary) hypertension: Secondary | ICD-10-CM | POA: Diagnosis not present

## 2015-09-30 DIAGNOSIS — R41841 Cognitive communication deficit: Secondary | ICD-10-CM | POA: Diagnosis not present

## 2015-09-30 DIAGNOSIS — R4182 Altered mental status, unspecified: Secondary | ICD-10-CM | POA: Diagnosis not present

## 2015-09-30 DIAGNOSIS — R2689 Other abnormalities of gait and mobility: Secondary | ICD-10-CM | POA: Diagnosis not present

## 2015-10-01 DIAGNOSIS — R41841 Cognitive communication deficit: Secondary | ICD-10-CM | POA: Diagnosis not present

## 2015-10-01 DIAGNOSIS — R4182 Altered mental status, unspecified: Secondary | ICD-10-CM | POA: Diagnosis not present

## 2015-10-01 DIAGNOSIS — R2689 Other abnormalities of gait and mobility: Secondary | ICD-10-CM | POA: Diagnosis not present

## 2015-10-01 DIAGNOSIS — I10 Essential (primary) hypertension: Secondary | ICD-10-CM | POA: Diagnosis not present

## 2015-10-01 DIAGNOSIS — M6281 Muscle weakness (generalized): Secondary | ICD-10-CM | POA: Diagnosis not present

## 2015-10-04 DIAGNOSIS — R41841 Cognitive communication deficit: Secondary | ICD-10-CM | POA: Diagnosis not present

## 2015-10-04 DIAGNOSIS — R4182 Altered mental status, unspecified: Secondary | ICD-10-CM | POA: Diagnosis not present

## 2015-10-04 DIAGNOSIS — I10 Essential (primary) hypertension: Secondary | ICD-10-CM | POA: Diagnosis not present

## 2015-10-04 DIAGNOSIS — R2689 Other abnormalities of gait and mobility: Secondary | ICD-10-CM | POA: Diagnosis not present

## 2015-10-04 DIAGNOSIS — M6281 Muscle weakness (generalized): Secondary | ICD-10-CM | POA: Diagnosis not present

## 2015-10-05 DIAGNOSIS — M6281 Muscle weakness (generalized): Secondary | ICD-10-CM | POA: Diagnosis not present

## 2015-10-05 DIAGNOSIS — R41841 Cognitive communication deficit: Secondary | ICD-10-CM | POA: Diagnosis not present

## 2015-10-05 DIAGNOSIS — R2689 Other abnormalities of gait and mobility: Secondary | ICD-10-CM | POA: Diagnosis not present

## 2015-10-05 DIAGNOSIS — R4182 Altered mental status, unspecified: Secondary | ICD-10-CM | POA: Diagnosis not present

## 2015-10-05 DIAGNOSIS — I10 Essential (primary) hypertension: Secondary | ICD-10-CM | POA: Diagnosis not present

## 2015-10-06 DIAGNOSIS — I10 Essential (primary) hypertension: Secondary | ICD-10-CM | POA: Diagnosis not present

## 2015-10-06 DIAGNOSIS — R41841 Cognitive communication deficit: Secondary | ICD-10-CM | POA: Diagnosis not present

## 2015-10-06 DIAGNOSIS — M6281 Muscle weakness (generalized): Secondary | ICD-10-CM | POA: Diagnosis not present

## 2015-10-06 DIAGNOSIS — R2689 Other abnormalities of gait and mobility: Secondary | ICD-10-CM | POA: Diagnosis not present

## 2015-10-06 DIAGNOSIS — R4182 Altered mental status, unspecified: Secondary | ICD-10-CM | POA: Diagnosis not present

## 2015-10-07 DIAGNOSIS — R41841 Cognitive communication deficit: Secondary | ICD-10-CM | POA: Diagnosis not present

## 2015-10-07 DIAGNOSIS — M6281 Muscle weakness (generalized): Secondary | ICD-10-CM | POA: Diagnosis not present

## 2015-10-07 DIAGNOSIS — R627 Adult failure to thrive: Secondary | ICD-10-CM | POA: Diagnosis not present

## 2015-10-07 DIAGNOSIS — I2510 Atherosclerotic heart disease of native coronary artery without angina pectoris: Secondary | ICD-10-CM | POA: Diagnosis not present

## 2015-10-07 DIAGNOSIS — R4182 Altered mental status, unspecified: Secondary | ICD-10-CM | POA: Diagnosis not present

## 2015-10-07 DIAGNOSIS — I10 Essential (primary) hypertension: Secondary | ICD-10-CM | POA: Diagnosis not present

## 2015-10-07 DIAGNOSIS — Z636 Dependent relative needing care at home: Secondary | ICD-10-CM | POA: Diagnosis not present

## 2015-10-07 DIAGNOSIS — R2689 Other abnormalities of gait and mobility: Secondary | ICD-10-CM | POA: Diagnosis not present

## 2015-10-08 DIAGNOSIS — I10 Essential (primary) hypertension: Secondary | ICD-10-CM | POA: Diagnosis not present

## 2015-10-08 DIAGNOSIS — R41841 Cognitive communication deficit: Secondary | ICD-10-CM | POA: Diagnosis not present

## 2015-10-08 DIAGNOSIS — M6281 Muscle weakness (generalized): Secondary | ICD-10-CM | POA: Diagnosis not present

## 2015-10-08 DIAGNOSIS — R2689 Other abnormalities of gait and mobility: Secondary | ICD-10-CM | POA: Diagnosis not present

## 2015-10-08 DIAGNOSIS — R4182 Altered mental status, unspecified: Secondary | ICD-10-CM | POA: Diagnosis not present

## 2015-10-09 DIAGNOSIS — M6281 Muscle weakness (generalized): Secondary | ICD-10-CM | POA: Diagnosis not present

## 2015-10-09 DIAGNOSIS — R41841 Cognitive communication deficit: Secondary | ICD-10-CM | POA: Diagnosis not present

## 2015-10-09 DIAGNOSIS — I10 Essential (primary) hypertension: Secondary | ICD-10-CM | POA: Diagnosis not present

## 2015-10-09 DIAGNOSIS — R2689 Other abnormalities of gait and mobility: Secondary | ICD-10-CM | POA: Diagnosis not present

## 2015-10-09 DIAGNOSIS — R4182 Altered mental status, unspecified: Secondary | ICD-10-CM | POA: Diagnosis not present

## 2015-10-11 DIAGNOSIS — R41841 Cognitive communication deficit: Secondary | ICD-10-CM | POA: Diagnosis not present

## 2015-10-11 DIAGNOSIS — I10 Essential (primary) hypertension: Secondary | ICD-10-CM | POA: Diagnosis not present

## 2015-10-11 DIAGNOSIS — R4182 Altered mental status, unspecified: Secondary | ICD-10-CM | POA: Diagnosis not present

## 2015-10-11 DIAGNOSIS — M6281 Muscle weakness (generalized): Secondary | ICD-10-CM | POA: Diagnosis not present

## 2015-10-11 DIAGNOSIS — R2689 Other abnormalities of gait and mobility: Secondary | ICD-10-CM | POA: Diagnosis not present

## 2015-10-12 DIAGNOSIS — R41841 Cognitive communication deficit: Secondary | ICD-10-CM | POA: Diagnosis not present

## 2015-10-12 DIAGNOSIS — I10 Essential (primary) hypertension: Secondary | ICD-10-CM | POA: Diagnosis not present

## 2015-10-12 DIAGNOSIS — R4182 Altered mental status, unspecified: Secondary | ICD-10-CM | POA: Diagnosis not present

## 2015-10-12 DIAGNOSIS — R2689 Other abnormalities of gait and mobility: Secondary | ICD-10-CM | POA: Diagnosis not present

## 2015-10-12 DIAGNOSIS — M6281 Muscle weakness (generalized): Secondary | ICD-10-CM | POA: Diagnosis not present

## 2015-10-13 DIAGNOSIS — R4182 Altered mental status, unspecified: Secondary | ICD-10-CM | POA: Diagnosis not present

## 2015-10-13 DIAGNOSIS — M6281 Muscle weakness (generalized): Secondary | ICD-10-CM | POA: Diagnosis not present

## 2015-10-13 DIAGNOSIS — I10 Essential (primary) hypertension: Secondary | ICD-10-CM | POA: Diagnosis not present

## 2015-10-13 DIAGNOSIS — R41841 Cognitive communication deficit: Secondary | ICD-10-CM | POA: Diagnosis not present

## 2015-10-13 DIAGNOSIS — R2689 Other abnormalities of gait and mobility: Secondary | ICD-10-CM | POA: Diagnosis not present

## 2015-10-14 DIAGNOSIS — R41841 Cognitive communication deficit: Secondary | ICD-10-CM | POA: Diagnosis not present

## 2015-10-14 DIAGNOSIS — R2689 Other abnormalities of gait and mobility: Secondary | ICD-10-CM | POA: Diagnosis not present

## 2015-10-14 DIAGNOSIS — R4182 Altered mental status, unspecified: Secondary | ICD-10-CM | POA: Diagnosis not present

## 2015-10-14 DIAGNOSIS — I10 Essential (primary) hypertension: Secondary | ICD-10-CM | POA: Diagnosis not present

## 2015-10-14 DIAGNOSIS — M6281 Muscle weakness (generalized): Secondary | ICD-10-CM | POA: Diagnosis not present

## 2015-10-15 DIAGNOSIS — I10 Essential (primary) hypertension: Secondary | ICD-10-CM | POA: Diagnosis not present

## 2015-10-15 DIAGNOSIS — M6281 Muscle weakness (generalized): Secondary | ICD-10-CM | POA: Diagnosis not present

## 2015-10-15 DIAGNOSIS — R4182 Altered mental status, unspecified: Secondary | ICD-10-CM | POA: Diagnosis not present

## 2015-10-15 DIAGNOSIS — R41841 Cognitive communication deficit: Secondary | ICD-10-CM | POA: Diagnosis not present

## 2015-10-15 DIAGNOSIS — R2689 Other abnormalities of gait and mobility: Secondary | ICD-10-CM | POA: Diagnosis not present

## 2015-10-16 DIAGNOSIS — R41841 Cognitive communication deficit: Secondary | ICD-10-CM | POA: Diagnosis not present

## 2015-10-16 DIAGNOSIS — I10 Essential (primary) hypertension: Secondary | ICD-10-CM | POA: Diagnosis not present

## 2015-10-16 DIAGNOSIS — M6281 Muscle weakness (generalized): Secondary | ICD-10-CM | POA: Diagnosis not present

## 2015-10-16 DIAGNOSIS — R4182 Altered mental status, unspecified: Secondary | ICD-10-CM | POA: Diagnosis not present

## 2015-10-16 DIAGNOSIS — R2689 Other abnormalities of gait and mobility: Secondary | ICD-10-CM | POA: Diagnosis not present

## 2015-10-18 DIAGNOSIS — R41841 Cognitive communication deficit: Secondary | ICD-10-CM | POA: Diagnosis not present

## 2015-10-18 DIAGNOSIS — I10 Essential (primary) hypertension: Secondary | ICD-10-CM | POA: Diagnosis not present

## 2015-10-18 DIAGNOSIS — F418 Other specified anxiety disorders: Secondary | ICD-10-CM | POA: Diagnosis not present

## 2015-10-18 DIAGNOSIS — R2689 Other abnormalities of gait and mobility: Secondary | ICD-10-CM | POA: Diagnosis not present

## 2015-10-18 DIAGNOSIS — M6281 Muscle weakness (generalized): Secondary | ICD-10-CM | POA: Diagnosis not present

## 2015-10-18 DIAGNOSIS — R4182 Altered mental status, unspecified: Secondary | ICD-10-CM | POA: Diagnosis not present

## 2015-10-19 ENCOUNTER — Telehealth: Payer: Self-pay | Admitting: Cardiology

## 2015-10-19 DIAGNOSIS — R4182 Altered mental status, unspecified: Secondary | ICD-10-CM | POA: Diagnosis not present

## 2015-10-19 DIAGNOSIS — R41841 Cognitive communication deficit: Secondary | ICD-10-CM | POA: Diagnosis not present

## 2015-10-19 DIAGNOSIS — R2689 Other abnormalities of gait and mobility: Secondary | ICD-10-CM | POA: Diagnosis not present

## 2015-10-19 DIAGNOSIS — M6281 Muscle weakness (generalized): Secondary | ICD-10-CM | POA: Diagnosis not present

## 2015-10-19 DIAGNOSIS — I10 Essential (primary) hypertension: Secondary | ICD-10-CM | POA: Diagnosis not present

## 2015-10-19 NOTE — Telephone Encounter (Signed)
Attempted to call pt and request that she send a manual transmission b/c her monitor has not updated in at least 8 days. No answer and unable to leave a message.

## 2015-10-20 DIAGNOSIS — M6281 Muscle weakness (generalized): Secondary | ICD-10-CM | POA: Diagnosis not present

## 2015-10-20 DIAGNOSIS — R2689 Other abnormalities of gait and mobility: Secondary | ICD-10-CM | POA: Diagnosis not present

## 2015-10-20 DIAGNOSIS — R41841 Cognitive communication deficit: Secondary | ICD-10-CM | POA: Diagnosis not present

## 2015-10-20 DIAGNOSIS — I10 Essential (primary) hypertension: Secondary | ICD-10-CM | POA: Diagnosis not present

## 2015-10-20 DIAGNOSIS — R4182 Altered mental status, unspecified: Secondary | ICD-10-CM | POA: Diagnosis not present

## 2015-10-28 ENCOUNTER — Telehealth: Payer: Self-pay | Admitting: Cardiology

## 2015-10-28 NOTE — Telephone Encounter (Signed)
Attempted to call pt and request a manual transmission b/c home monitor has not updated in at least 8 days. No answer and unable to leave a message.

## 2015-12-06 ENCOUNTER — Ambulatory Visit (INDEPENDENT_AMBULATORY_CARE_PROVIDER_SITE_OTHER): Payer: Medicare Other | Admitting: Internal Medicine

## 2015-12-06 ENCOUNTER — Encounter: Payer: Self-pay | Admitting: Internal Medicine

## 2015-12-06 VITALS — BP 142/78 | HR 62 | Ht 60.0 in | Wt 101.4 lb

## 2015-12-06 DIAGNOSIS — I10 Essential (primary) hypertension: Secondary | ICD-10-CM | POA: Diagnosis not present

## 2015-12-06 DIAGNOSIS — I5022 Chronic systolic (congestive) heart failure: Secondary | ICD-10-CM | POA: Diagnosis not present

## 2015-12-06 DIAGNOSIS — I454 Nonspecific intraventricular block: Secondary | ICD-10-CM | POA: Diagnosis not present

## 2015-12-06 LAB — CUP PACEART INCLINIC DEVICE CHECK
Battery Remaining Longevity: 64.8
HIGH POWER IMPEDANCE MEASURED VALUE: 52.5353
Implantable Lead Implant Date: 20100707
Implantable Lead Location: 753859
Implantable Lead Location: 753860
Implantable Lead Model: 7120
Lead Channel Impedance Value: 362.5 Ohm
Lead Channel Pacing Threshold Amplitude: 0.75 V
Lead Channel Pacing Threshold Amplitude: 1 V
Lead Channel Pacing Threshold Amplitude: 1 V
Lead Channel Pacing Threshold Amplitude: 1 V
Lead Channel Pacing Threshold Pulse Width: 0.5 ms
Lead Channel Pacing Threshold Pulse Width: 0.6 ms
Lead Channel Sensing Intrinsic Amplitude: 12 mV
Lead Channel Sensing Intrinsic Amplitude: 2 mV
Lead Channel Setting Pacing Amplitude: 2 V
Lead Channel Setting Pacing Amplitude: 2.5 V
Lead Channel Setting Pacing Pulse Width: 0.5 ms
Lead Channel Setting Sensing Sensitivity: 0.5 mV
MDC IDC LEAD IMPLANT DT: 20100707
MDC IDC LEAD IMPLANT DT: 20100707
MDC IDC LEAD LOCATION: 753858
MDC IDC MSMT LEADCHNL LV IMPEDANCE VALUE: 537.5 Ohm
MDC IDC MSMT LEADCHNL LV PACING THRESHOLD AMPLITUDE: 1 V
MDC IDC MSMT LEADCHNL LV PACING THRESHOLD PULSEWIDTH: 0.5 ms
MDC IDC MSMT LEADCHNL RA PACING THRESHOLD AMPLITUDE: 0.75 V
MDC IDC MSMT LEADCHNL RA PACING THRESHOLD PULSEWIDTH: 0.5 ms
MDC IDC MSMT LEADCHNL RA PACING THRESHOLD PULSEWIDTH: 0.5 ms
MDC IDC MSMT LEADCHNL RV IMPEDANCE VALUE: 437.5 Ohm
MDC IDC MSMT LEADCHNL RV PACING THRESHOLD PULSEWIDTH: 0.6 ms
MDC IDC PG SERIAL: 7226908
MDC IDC SESS DTM: 20170313155823
MDC IDC SET LEADCHNL RA PACING AMPLITUDE: 2 V
MDC IDC SET LEADCHNL RV PACING PULSEWIDTH: 0.6 ms
MDC IDC STAT BRADY RA PERCENT PACED: 46 %
MDC IDC STAT BRADY RV PERCENT PACED: 97 %

## 2015-12-06 NOTE — Patient Instructions (Signed)
Medication Instructions:  Your physician recommends that you continue on your current medications as directed. Please refer to the Current Medication list given to you today.   Labwork: None ordered   Testing/Procedures: None ordered   Follow-Up: Your physician wants you to follow-up in: 12 months with Denise Balsam, NP You will receive a reminder letter in the mail two months in advance. If you don't receive a letter, please call our office to schedule the follow-up appointment.  Remote monitoring is used to monitor your ICD from home. This monitoring reduces the number of office visits required to check your device to one time per year. It allows [place] to keep an eye on the functioning of your device to ensure it is working properly. You are scheduled for a device check from home on 03/06/16. You may send your transmission at any time that day. If you have a wireless device, the transmission will be sent automatically. After your physician reviews your transmission, you will receive a postcard with your next transmission date.     Any Other Special Instructions Will Be Listed Below (If Applicable).     If you need a refill on your cardiac medications before your next appointment, please call your pharmacy.

## 2015-12-06 NOTE — Progress Notes (Signed)
. PCP: Pearla Dubonnet, MD Primary Cardiologist:  Dr Anne Fu  [name] is a 82 y.o. female who presents today for routine electrophysiology followup. She reports doing reasonably well since last office visit.  She does report increased fatigue with exertion.  She presents today for further evaluation.   Today, she denies symptoms of palpitations, chest pain,   lower extremity edema, dizziness, presyncope, syncope, or ICD shocks.  The patient is otherwise without complaint today.   Past Medical History  Diagnosis Date  . Ischemic cardiomyopathy     severe. Left ventricular ejection fraction 20%.   . Chronic systolic heart failure (HCC)     NYHA class II.  [name] Chronic pulmonary disease   . BBB (bundle branch block)     s/p BiV ICD implant  . Raynaud's syndrome   . Neuromuscular scoliosis of thoracolumbar region     type of scoliosis was not specified.   [name] DJD (degenerative joint disease), cervical   . DJD (degenerative joint disease), lumbar   . HTN (hypertension)   . Hyperthyroidism     following Graves disease  . Renal artery stenosis (HCC)     Treated with angioplast in 1980 and 1987.   . S/P CABG (coronary artery bypass graft) April 2012  . FH: mitral valve repair     with 26 mm Edwards ring angioplasty,   . COPD (chronic obstructive pulmonary disease) (HCC)   . Full dentures   . PONV (postoperative nausea and vomiting)   . CHF (congestive heart failure) (HCC)   . Dizziness   . Automatic implantable cardioverter-defibrillator in situ   . Pacemaker   . Asthma   . Pneumonia     hx  . GERD (gastroesophageal reflux disease)   . Anemia     takes iron 3 days per week  . Hx of cardiovascular stress test     Lexiscan Myoview (9/15):  Normal stress nuclear study.  LV Ejection Fraction: 76%   Past Surgical History  Procedure Laterality Date  . Total abdominal hysterectomy    . Sympathectomy    . Tonsillectomy    . Renal artery ballon dilation    . Rotator cuff repair       right and left  . Laminotomy/foraminotomy      with decompression of the L4 nerve root   . Cervical fusion      C5-6 and C6-7, C4-5 with titanium plates  . Umbilical hernia repair    . Wedge resection  2001    for the right upper lobe for Aspergillus treatement.  Dr. Edwyna Shell apprix 2001.  [name] Ptca      of bilateral renal arteries  . Cataract extraction    . Carpal tunnel release    . Appendectomy    . Breast lumpectomy      left breast  . Renal artery ballon dilation      x2  . Precancerous growth      tops of ear removed. bilateral.   . Implantation of icd  2010    BiV ICD implant (SJM) by Dr Amil Amen 03/2009  . Repair extensor tendon  07/10/2012    Procedure: REPAIR EXTENSOR TENDON;  Surgeon: Nicki Reaper, MD;  Location: [facility];  Service: Orthopedics;  Laterality: Right;  METACARPAL PHALANGEAL REPLACEMENT ARTHROPLASTIES RIGHT INDEX, MIDDLE, AND RING FINGERS    . Finger arthroplasty  07/10/2012    Procedure: FINGER ARTHROPLASTY;  Surgeon: Nicki Reaper, MD;  Location:  SURGERY  CENTER;  Service: Orthopedics;  Laterality: Right;  METACARPAL PHALANGEAL ARTHROPLASTIES RIGHT INDEX, MIDDLE, AND RING FINGERS   . Esophageal manometry N/A 04/07/2013    Procedure: ESOPHAGEAL MANOMETRY (EM);  Surgeon: Charolett Bumpers, MD;  Location: [facility];  Service: Endoscopy;  Laterality: N/A;  . Carpometacarpel suspension plasty Right 11/12/2013    Procedure: SUSPENSION PLASTY RIGHT THUMB, TRAPEZIUM EXCISION;  Surgeon: Nicki Reaper, MD;  Location: [facility];  Service: Orthopedics;  Laterality: Right;  . Tendon transfer Right 11/12/2013    Procedure: RIGHT ABDUCTOR POLLICUS LONGUS TENDON TRANSFER;  Surgeon: Nicki Reaper, MD;  Location: [facility];  Service: Orthopedics;  Laterality: Right;  . Tonsillectomy    . Hemorrhoidectomy with hemorrhoid banding    . Facial cosmetic surgery    . Back surgery    . Eye surgery Bilateral     cataracts  .  Hernia repair      umbilical  . Lung mass removal Right   . Left knee arthroscopic    . Coronary artery bypass graft  2010    mvr/cabg  . Total knee arthroplasty Left 04/29/2014    Procedure: LEFT TOTAL KNEE ARTHROPLASTY;  Surgeon: Nadara Mustard, MD;  Location: [facility];  Service: Orthopedics;  Laterality: Left;  . Femur im nail Left 09/29/2014    Procedure: Affixus Trochanteric Femoral Nail;  Surgeon: Eldred Manges, MD;  Location: [facility];  Service: Orthopedics;  Laterality: Left;  . Biv icd genertaor change out N/A 11/11/2014    Procedure: BIV ICD GENERTAOR CHANGE OUT;  Surgeon: Hillis Range, MD;  Location: [facility];  Service: Cardiovascular;  Laterality: N/A;    Current Outpatient Prescriptions  Medication Sig Dispense Refill  . acetaminophen (TYLENOL) 500 MG tablet Take 500 mg by mouth every 6 (six) hours as needed for mild pain or moderate pain.    [name] amLODipine (NORVASC) 5 MG tablet Take 1 tablet (5 mg total) by mouth daily. 30 tablet 0  . carvedilol (COREG) 12.5 MG tablet Take 1 tablet (12.5 mg total) by mouth 2 (two) times daily. 180 tablet 3  . Cholecalciferol (VITAMIN D3) 2000 UNITS capsule Take 2,000 Units by mouth daily.     . Coenzyme Q10 (COQ10) 100 MG CAPS Take 100 mg by mouth daily.     . fluconazole (DIFLUCAN) 100 MG tablet Take 100 mg by mouth once a week. Sundays    . levothyroxine (SYNTHROID, LEVOTHROID) 75 MCG tablet Take 75 mcg by mouth daily before breakfast.    . montelukast (SINGULAIR) 10 MG tablet Take 10 mg by mouth daily.     [name] NAMZARIC 28-10 MG CP24 Take 1 capsule by mouth daily.    . nortriptyline (PAMELOR) 25 MG capsule Take 25 mg by mouth at bedtime.    . pantoprazole (PROTONIX) 40 MG tablet Take 40 mg by mouth daily.     . Potassium Gluconate 595 MG CAPS Take 1 capsule by mouth daily.    . Probiotic Product (PROBIOTIC DAILY PO) Take 1 capsule by mouth daily.    . valsartan (DIOVAN) 160 MG tablet Take 160 mg by mouth daily. Reported on 09/24/2015     No current  facility-administered medications for this visit.   ROS- all systems are reviewed and negative except as per HPI  Social History   Social History  . Marital Status: Married    Spouse Name: N/A  . Number of Children: N/A  . Years of Education: N/A   Occupational History  .  Not on file.   Social History Main Topics  . Smoking status: Never Smoker   . Smokeless tobacco: Not on file     Comment: passive smoker from birth to age 22 (mom and husband)  . Alcohol Use: No  . Drug Use: Not on file  . Sexual Activity: Not on file   Other Topics Concern  . Not on file   Social History Narrative   Married and lives in [place].  Retired   Family History  Problem Relation Age of Onset  . Heart disease Father   . Hypertension Father   . Colon cancer      grandmother     Physical Exam: Filed Vitals:   12/06/15 1242  BP: 142/78  Pulse: 62  Height: 5' (1.524 m)  Weight: 101 lb 6.4 oz (45.995 kg)    GEN- The patient is thin, frail and chronically ill appearing, alert and oriented x 3 today.   Head- normocephalic, atraumatic Eyes-  Sclera clear, conjunctiva pink Ears- hearing intact Oropharynx- clear Lungs- Clear to ausculation bilaterally, normal work of breathing Chest- ICD pocket is well healed Heart- Regular rate and rhythm (paced) GI- soft, NT, ND, + BS Extremities- no clubbing, cyanosis, or edema  ICD remote- reviewed in detail today,  See PACEART report   Assessment and Plan:  1. Chronic systolic dysfunction euvolemic on exam today Normal CRTD function - see PaceArt report Rate response turned on today  Merlin   2. HTN Stable No change required today  3. CAD No ischemic symptoms  Follow up with Arsenio Katz and EP NP in 12 months  Signed, Hillis Range, MD 12/06/2015 2:11 PM

## 2015-12-09 DIAGNOSIS — Z636 Dependent relative needing care at home: Secondary | ICD-10-CM | POA: Diagnosis not present

## 2015-12-09 DIAGNOSIS — F418 Other specified anxiety disorders: Secondary | ICD-10-CM | POA: Diagnosis not present

## 2015-12-09 DIAGNOSIS — R634 Abnormal weight loss: Secondary | ICD-10-CM | POA: Diagnosis not present

## 2015-12-09 DIAGNOSIS — I2510 Atherosclerotic heart disease of native coronary artery without angina pectoris: Secondary | ICD-10-CM | POA: Diagnosis not present

## 2015-12-09 DIAGNOSIS — R410 Disorientation, unspecified: Secondary | ICD-10-CM | POA: Diagnosis not present

## 2015-12-09 DIAGNOSIS — F0390 Unspecified dementia without behavioral disturbance: Secondary | ICD-10-CM | POA: Diagnosis not present

## 2015-12-09 DIAGNOSIS — R627 Adult failure to thrive: Secondary | ICD-10-CM | POA: Diagnosis not present

## 2015-12-09 DIAGNOSIS — R531 Weakness: Secondary | ICD-10-CM | POA: Diagnosis not present

## 2015-12-09 DIAGNOSIS — I10 Essential (primary) hypertension: Secondary | ICD-10-CM | POA: Diagnosis not present

## 2015-12-13 DIAGNOSIS — M6281 Muscle weakness (generalized): Secondary | ICD-10-CM | POA: Diagnosis not present

## 2015-12-13 DIAGNOSIS — E039 Hypothyroidism, unspecified: Secondary | ICD-10-CM | POA: Diagnosis not present

## 2015-12-29 DIAGNOSIS — H1789 Other corneal scars and opacities: Secondary | ICD-10-CM | POA: Diagnosis not present

## 2015-12-31 DIAGNOSIS — F0390 Unspecified dementia without behavioral disturbance: Secondary | ICD-10-CM | POA: Diagnosis not present

## 2015-12-31 DIAGNOSIS — G3184 Mild cognitive impairment, so stated: Secondary | ICD-10-CM | POA: Diagnosis not present

## 2016-01-25 DIAGNOSIS — E039 Hypothyroidism, unspecified: Secondary | ICD-10-CM | POA: Diagnosis not present

## 2016-02-24 DIAGNOSIS — R946 Abnormal results of thyroid function studies: Secondary | ICD-10-CM | POA: Diagnosis not present

## 2016-03-03 DIAGNOSIS — I5022 Chronic systolic (congestive) heart failure: Secondary | ICD-10-CM | POA: Diagnosis not present

## 2016-03-03 DIAGNOSIS — J449 Chronic obstructive pulmonary disease, unspecified: Secondary | ICD-10-CM | POA: Diagnosis not present

## 2016-03-03 DIAGNOSIS — F0390 Unspecified dementia without behavioral disturbance: Secondary | ICD-10-CM | POA: Diagnosis not present

## 2016-03-03 DIAGNOSIS — R05 Cough: Secondary | ICD-10-CM | POA: Diagnosis not present

## 2016-03-03 DIAGNOSIS — I10 Essential (primary) hypertension: Secondary | ICD-10-CM | POA: Diagnosis not present

## 2016-03-06 ENCOUNTER — Ambulatory Visit (INDEPENDENT_AMBULATORY_CARE_PROVIDER_SITE_OTHER): Payer: Medicare Other | Admitting: *Deleted

## 2016-03-06 DIAGNOSIS — I255 Ischemic cardiomyopathy: Secondary | ICD-10-CM

## 2016-03-06 NOTE — Progress Notes (Signed)
Remote ICD transmission.   

## 2016-03-11 DIAGNOSIS — R05 Cough: Secondary | ICD-10-CM | POA: Diagnosis not present

## 2016-03-16 ENCOUNTER — Encounter: Payer: Self-pay | Admitting: Cardiology

## 2016-03-21 DIAGNOSIS — E785 Hyperlipidemia, unspecified: Secondary | ICD-10-CM | POA: Diagnosis not present

## 2016-03-22 DIAGNOSIS — E039 Hypothyroidism, unspecified: Secondary | ICD-10-CM | POA: Diagnosis not present

## 2016-03-22 DIAGNOSIS — I10 Essential (primary) hypertension: Secondary | ICD-10-CM | POA: Diagnosis not present

## 2016-03-22 DIAGNOSIS — F0150 Vascular dementia without behavioral disturbance: Secondary | ICD-10-CM | POA: Diagnosis not present

## 2016-03-23 DIAGNOSIS — I10 Essential (primary) hypertension: Secondary | ICD-10-CM | POA: Diagnosis not present

## 2016-03-30 ENCOUNTER — Encounter: Payer: Self-pay | Admitting: Cardiology

## 2016-03-30 LAB — CUP PACEART REMOTE DEVICE CHECK
Battery Remaining Longevity: 62 mo
Battery Remaining Percentage: 80 %
Battery Voltage: 2.98 V
Brady Statistic AP VP Percent: 50 %
Brady Statistic RA Percent Paced: 50 %
Date Time Interrogation Session: 20170612060016
HIGH POWER IMPEDANCE MEASURED VALUE: 56 Ohm
HIGH POWER IMPEDANCE MEASURED VALUE: 56 Ohm
Implantable Lead Implant Date: 20100707
Implantable Lead Location: 753860
Implantable Lead Model: 7120
Lead Channel Impedance Value: 390 Ohm
Lead Channel Impedance Value: 510 Ohm
Lead Channel Sensing Intrinsic Amplitude: 1.5 mV
Lead Channel Sensing Intrinsic Amplitude: 12 mV
Lead Channel Setting Pacing Amplitude: 2.5 V
Lead Channel Setting Pacing Pulse Width: 0.5 ms
Lead Channel Setting Pacing Pulse Width: 0.6 ms
MDC IDC LEAD IMPLANT DT: 20100707
MDC IDC LEAD IMPLANT DT: 20100707
MDC IDC LEAD LOCATION: 753858
MDC IDC LEAD LOCATION: 753859
MDC IDC MSMT LEADCHNL LV IMPEDANCE VALUE: 550 Ohm
MDC IDC SET LEADCHNL LV PACING AMPLITUDE: 2 V
MDC IDC SET LEADCHNL RA PACING AMPLITUDE: 2 V
MDC IDC SET LEADCHNL RV SENSING SENSITIVITY: 0.5 mV
MDC IDC STAT BRADY AP VS PERCENT: 1 %
MDC IDC STAT BRADY AS VP PERCENT: 49 %
MDC IDC STAT BRADY AS VS PERCENT: 1 %
Pulse Gen Serial Number: 7226908

## 2016-05-09 DIAGNOSIS — E039 Hypothyroidism, unspecified: Secondary | ICD-10-CM | POA: Diagnosis not present

## 2016-05-17 DIAGNOSIS — F4322 Adjustment disorder with anxiety: Secondary | ICD-10-CM | POA: Diagnosis not present

## 2016-05-24 DIAGNOSIS — M25562 Pain in left knee: Secondary | ICD-10-CM | POA: Diagnosis not present

## 2016-06-05 ENCOUNTER — Ambulatory Visit (INDEPENDENT_AMBULATORY_CARE_PROVIDER_SITE_OTHER): Payer: Medicare Other | Admitting: *Deleted

## 2016-06-05 DIAGNOSIS — I255 Ischemic cardiomyopathy: Secondary | ICD-10-CM

## 2016-06-05 NOTE — Progress Notes (Signed)
Remote ICD transmission.   

## 2016-06-07 DIAGNOSIS — G8929 Other chronic pain: Secondary | ICD-10-CM | POA: Diagnosis not present

## 2016-06-07 DIAGNOSIS — M25562 Pain in left knee: Secondary | ICD-10-CM | POA: Diagnosis not present

## 2016-06-07 DIAGNOSIS — R2689 Other abnormalities of gait and mobility: Secondary | ICD-10-CM | POA: Diagnosis not present

## 2016-06-07 DIAGNOSIS — R262 Difficulty in walking, not elsewhere classified: Secondary | ICD-10-CM | POA: Diagnosis not present

## 2016-06-07 DIAGNOSIS — M6281 Muscle weakness (generalized): Secondary | ICD-10-CM | POA: Diagnosis not present

## 2016-06-08 ENCOUNTER — Encounter: Payer: Self-pay | Admitting: Cardiology

## 2016-06-08 DIAGNOSIS — M6281 Muscle weakness (generalized): Secondary | ICD-10-CM | POA: Diagnosis not present

## 2016-06-08 DIAGNOSIS — R2689 Other abnormalities of gait and mobility: Secondary | ICD-10-CM | POA: Diagnosis not present

## 2016-06-08 DIAGNOSIS — R262 Difficulty in walking, not elsewhere classified: Secondary | ICD-10-CM | POA: Diagnosis not present

## 2016-06-08 DIAGNOSIS — G8929 Other chronic pain: Secondary | ICD-10-CM | POA: Diagnosis not present

## 2016-06-09 DIAGNOSIS — G8929 Other chronic pain: Secondary | ICD-10-CM | POA: Diagnosis not present

## 2016-06-09 DIAGNOSIS — D649 Anemia, unspecified: Secondary | ICD-10-CM | POA: Diagnosis not present

## 2016-06-09 DIAGNOSIS — M6281 Muscle weakness (generalized): Secondary | ICD-10-CM | POA: Diagnosis not present

## 2016-06-09 DIAGNOSIS — E0590 Thyrotoxicosis, unspecified without thyrotoxic crisis or storm: Secondary | ICD-10-CM | POA: Diagnosis not present

## 2016-06-09 DIAGNOSIS — R262 Difficulty in walking, not elsewhere classified: Secondary | ICD-10-CM | POA: Diagnosis not present

## 2016-06-09 DIAGNOSIS — R2689 Other abnormalities of gait and mobility: Secondary | ICD-10-CM | POA: Diagnosis not present

## 2016-06-12 DIAGNOSIS — M25562 Pain in left knee: Secondary | ICD-10-CM | POA: Diagnosis not present

## 2016-06-12 DIAGNOSIS — Z96652 Presence of left artificial knee joint: Secondary | ICD-10-CM | POA: Diagnosis not present

## 2016-06-12 DIAGNOSIS — M25552 Pain in left hip: Secondary | ICD-10-CM | POA: Diagnosis not present

## 2016-06-12 DIAGNOSIS — R262 Difficulty in walking, not elsewhere classified: Secondary | ICD-10-CM | POA: Diagnosis not present

## 2016-06-12 DIAGNOSIS — M6281 Muscle weakness (generalized): Secondary | ICD-10-CM | POA: Diagnosis not present

## 2016-06-12 DIAGNOSIS — R2689 Other abnormalities of gait and mobility: Secondary | ICD-10-CM | POA: Diagnosis not present

## 2016-06-12 DIAGNOSIS — G8929 Other chronic pain: Secondary | ICD-10-CM | POA: Diagnosis not present

## 2016-06-12 DIAGNOSIS — S72142A Displaced intertrochanteric fracture of left femur, initial encounter for closed fracture: Secondary | ICD-10-CM | POA: Diagnosis not present

## 2016-06-13 DIAGNOSIS — E0590 Thyrotoxicosis, unspecified without thyrotoxic crisis or storm: Secondary | ICD-10-CM | POA: Diagnosis not present

## 2016-06-14 LAB — CUP PACEART REMOTE DEVICE CHECK
Battery Remaining Longevity: 60 mo
Brady Statistic AP VS Percent: 1 %
Brady Statistic AS VP Percent: 50 %
HighPow Impedance: 55 Ohm
HighPow Impedance: 55 Ohm
Implantable Lead Implant Date: 20100707
Implantable Lead Model: 7120
Lead Channel Impedance Value: 360 Ohm
Lead Channel Impedance Value: 490 Ohm
Lead Channel Pacing Threshold Amplitude: 0.75 V
Lead Channel Pacing Threshold Pulse Width: 0.5 ms
Lead Channel Setting Pacing Amplitude: 2 V
Lead Channel Setting Pacing Amplitude: 2 V
Lead Channel Setting Pacing Pulse Width: 0.5 ms
Lead Channel Setting Pacing Pulse Width: 0.6 ms
MDC IDC LEAD IMPLANT DT: 20100707
MDC IDC LEAD IMPLANT DT: 20100707
MDC IDC LEAD LOCATION: 753858
MDC IDC LEAD LOCATION: 753859
MDC IDC LEAD LOCATION: 753860
MDC IDC MSMT BATTERY REMAINING PERCENTAGE: 76 %
MDC IDC MSMT BATTERY VOLTAGE: 2.98 V
MDC IDC MSMT LEADCHNL LV IMPEDANCE VALUE: 550 Ohm
MDC IDC MSMT LEADCHNL LV PACING THRESHOLD AMPLITUDE: 1 V
MDC IDC MSMT LEADCHNL LV PACING THRESHOLD PULSEWIDTH: 0.5 ms
MDC IDC MSMT LEADCHNL RA SENSING INTR AMPL: 1.8 mV
MDC IDC MSMT LEADCHNL RV PACING THRESHOLD AMPLITUDE: 1 V
MDC IDC MSMT LEADCHNL RV PACING THRESHOLD PULSEWIDTH: 0.6 ms
MDC IDC MSMT LEADCHNL RV SENSING INTR AMPL: 12 mV
MDC IDC PG SERIAL: 7226908
MDC IDC SESS DTM: 20170911060017
MDC IDC SET LEADCHNL RV PACING AMPLITUDE: 2.5 V
MDC IDC SET LEADCHNL RV SENSING SENSITIVITY: 0.5 mV
MDC IDC STAT BRADY AP VP PERCENT: 49 %
MDC IDC STAT BRADY AS VS PERCENT: 1 %
MDC IDC STAT BRADY RA PERCENT PACED: 49 %

## 2016-06-16 DIAGNOSIS — G8929 Other chronic pain: Secondary | ICD-10-CM | POA: Diagnosis not present

## 2016-06-16 DIAGNOSIS — R262 Difficulty in walking, not elsewhere classified: Secondary | ICD-10-CM | POA: Diagnosis not present

## 2016-06-16 DIAGNOSIS — R2689 Other abnormalities of gait and mobility: Secondary | ICD-10-CM | POA: Diagnosis not present

## 2016-06-16 DIAGNOSIS — M6281 Muscle weakness (generalized): Secondary | ICD-10-CM | POA: Diagnosis not present

## 2016-06-19 DIAGNOSIS — M6281 Muscle weakness (generalized): Secondary | ICD-10-CM | POA: Diagnosis not present

## 2016-06-19 DIAGNOSIS — G8929 Other chronic pain: Secondary | ICD-10-CM | POA: Diagnosis not present

## 2016-06-19 DIAGNOSIS — R262 Difficulty in walking, not elsewhere classified: Secondary | ICD-10-CM | POA: Diagnosis not present

## 2016-06-19 DIAGNOSIS — R2689 Other abnormalities of gait and mobility: Secondary | ICD-10-CM | POA: Diagnosis not present

## 2016-06-20 DIAGNOSIS — M6281 Muscle weakness (generalized): Secondary | ICD-10-CM | POA: Diagnosis not present

## 2016-06-20 DIAGNOSIS — R2689 Other abnormalities of gait and mobility: Secondary | ICD-10-CM | POA: Diagnosis not present

## 2016-06-20 DIAGNOSIS — R262 Difficulty in walking, not elsewhere classified: Secondary | ICD-10-CM | POA: Diagnosis not present

## 2016-06-20 DIAGNOSIS — G8929 Other chronic pain: Secondary | ICD-10-CM | POA: Diagnosis not present

## 2016-06-22 ENCOUNTER — Encounter: Payer: Self-pay | Admitting: Cardiology

## 2016-06-22 DIAGNOSIS — M6281 Muscle weakness (generalized): Secondary | ICD-10-CM | POA: Diagnosis not present

## 2016-06-22 DIAGNOSIS — G8929 Other chronic pain: Secondary | ICD-10-CM | POA: Diagnosis not present

## 2016-06-22 DIAGNOSIS — R262 Difficulty in walking, not elsewhere classified: Secondary | ICD-10-CM | POA: Diagnosis not present

## 2016-06-22 DIAGNOSIS — R2689 Other abnormalities of gait and mobility: Secondary | ICD-10-CM | POA: Diagnosis not present

## 2016-06-26 DIAGNOSIS — R2689 Other abnormalities of gait and mobility: Secondary | ICD-10-CM | POA: Diagnosis not present

## 2016-06-26 DIAGNOSIS — R262 Difficulty in walking, not elsewhere classified: Secondary | ICD-10-CM | POA: Diagnosis not present

## 2016-06-26 DIAGNOSIS — G8929 Other chronic pain: Secondary | ICD-10-CM | POA: Diagnosis not present

## 2016-06-26 DIAGNOSIS — M6281 Muscle weakness (generalized): Secondary | ICD-10-CM | POA: Diagnosis not present

## 2016-06-27 DIAGNOSIS — R2689 Other abnormalities of gait and mobility: Secondary | ICD-10-CM | POA: Diagnosis not present

## 2016-06-27 DIAGNOSIS — R262 Difficulty in walking, not elsewhere classified: Secondary | ICD-10-CM | POA: Diagnosis not present

## 2016-06-27 DIAGNOSIS — G8929 Other chronic pain: Secondary | ICD-10-CM | POA: Diagnosis not present

## 2016-06-27 DIAGNOSIS — M6281 Muscle weakness (generalized): Secondary | ICD-10-CM | POA: Diagnosis not present

## 2016-07-03 DIAGNOSIS — M6281 Muscle weakness (generalized): Secondary | ICD-10-CM | POA: Diagnosis not present

## 2016-07-03 DIAGNOSIS — R262 Difficulty in walking, not elsewhere classified: Secondary | ICD-10-CM | POA: Diagnosis not present

## 2016-07-03 DIAGNOSIS — G8929 Other chronic pain: Secondary | ICD-10-CM | POA: Diagnosis not present

## 2016-07-03 DIAGNOSIS — R2689 Other abnormalities of gait and mobility: Secondary | ICD-10-CM | POA: Diagnosis not present

## 2016-07-04 DIAGNOSIS — G8929 Other chronic pain: Secondary | ICD-10-CM | POA: Diagnosis not present

## 2016-07-04 DIAGNOSIS — R262 Difficulty in walking, not elsewhere classified: Secondary | ICD-10-CM | POA: Diagnosis not present

## 2016-07-04 DIAGNOSIS — M6281 Muscle weakness (generalized): Secondary | ICD-10-CM | POA: Diagnosis not present

## 2016-07-04 DIAGNOSIS — R2689 Other abnormalities of gait and mobility: Secondary | ICD-10-CM | POA: Diagnosis not present

## 2016-07-06 DIAGNOSIS — G8929 Other chronic pain: Secondary | ICD-10-CM | POA: Diagnosis not present

## 2016-07-06 DIAGNOSIS — R262 Difficulty in walking, not elsewhere classified: Secondary | ICD-10-CM | POA: Diagnosis not present

## 2016-07-06 DIAGNOSIS — M6281 Muscle weakness (generalized): Secondary | ICD-10-CM | POA: Diagnosis not present

## 2016-07-06 DIAGNOSIS — R2689 Other abnormalities of gait and mobility: Secondary | ICD-10-CM | POA: Diagnosis not present

## 2016-07-08 DIAGNOSIS — Z79899 Other long term (current) drug therapy: Secondary | ICD-10-CM | POA: Diagnosis not present

## 2016-07-08 DIAGNOSIS — R319 Hematuria, unspecified: Secondary | ICD-10-CM | POA: Diagnosis not present

## 2016-07-08 DIAGNOSIS — D649 Anemia, unspecified: Secondary | ICD-10-CM | POA: Diagnosis not present

## 2016-07-08 DIAGNOSIS — N390 Urinary tract infection, site not specified: Secondary | ICD-10-CM | POA: Diagnosis not present

## 2016-07-09 ENCOUNTER — Encounter (HOSPITAL_COMMUNITY): Payer: Self-pay | Admitting: Emergency Medicine

## 2016-07-09 ENCOUNTER — Inpatient Hospital Stay (HOSPITAL_COMMUNITY)
Admission: EM | Admit: 2016-07-09 | Discharge: 2016-07-13 | DRG: 871 | Disposition: A | Discharge status: Skilled Nursing Facility | Payer: Medicare Other | Attending: Internal Medicine | Admitting: Internal Medicine

## 2016-07-09 ENCOUNTER — Emergency Department (HOSPITAL_COMMUNITY): Payer: Medicare Other

## 2016-07-09 ENCOUNTER — Inpatient Hospital Stay (HOSPITAL_COMMUNITY): Payer: Medicare Other

## 2016-07-09 DIAGNOSIS — Z952 Presence of prosthetic heart valve: Secondary | ICD-10-CM

## 2016-07-09 DIAGNOSIS — R739 Hyperglycemia, unspecified: Secondary | ICD-10-CM | POA: Diagnosis present

## 2016-07-09 DIAGNOSIS — E039 Hypothyroidism, unspecified: Secondary | ICD-10-CM | POA: Diagnosis present

## 2016-07-09 DIAGNOSIS — I444 Left anterior fascicular block: Secondary | ICD-10-CM | POA: Diagnosis present

## 2016-07-09 DIAGNOSIS — R109 Unspecified abdominal pain: Secondary | ICD-10-CM

## 2016-07-09 DIAGNOSIS — N390 Urinary tract infection, site not specified: Secondary | ICD-10-CM | POA: Diagnosis not present

## 2016-07-09 DIAGNOSIS — J449 Chronic obstructive pulmonary disease, unspecified: Secondary | ICD-10-CM | POA: Diagnosis present

## 2016-07-09 DIAGNOSIS — N1330 Unspecified hydronephrosis: Secondary | ICD-10-CM | POA: Diagnosis not present

## 2016-07-09 DIAGNOSIS — I2510 Atherosclerotic heart disease of native coronary artery without angina pectoris: Secondary | ICD-10-CM | POA: Diagnosis present

## 2016-07-09 DIAGNOSIS — I5022 Chronic systolic (congestive) heart failure: Secondary | ICD-10-CM | POA: Diagnosis present

## 2016-07-09 DIAGNOSIS — I130 Hypertensive heart and chronic kidney disease with heart failure and stage 1 through stage 4 chronic kidney disease, or unspecified chronic kidney disease: Secondary | ICD-10-CM | POA: Diagnosis present

## 2016-07-09 DIAGNOSIS — R6520 Severe sepsis without septic shock: Secondary | ICD-10-CM | POA: Diagnosis not present

## 2016-07-09 DIAGNOSIS — N12 Tubulo-interstitial nephritis, not specified as acute or chronic: Secondary | ICD-10-CM | POA: Diagnosis present

## 2016-07-09 DIAGNOSIS — F05 Delirium due to known physiological condition: Secondary | ICD-10-CM | POA: Diagnosis present

## 2016-07-09 DIAGNOSIS — I255 Ischemic cardiomyopathy: Secondary | ICD-10-CM | POA: Diagnosis present

## 2016-07-09 DIAGNOSIS — M6281 Muscle weakness (generalized): Secondary | ICD-10-CM | POA: Diagnosis not present

## 2016-07-09 DIAGNOSIS — R4182 Altered mental status, unspecified: Secondary | ICD-10-CM | POA: Diagnosis not present

## 2016-07-09 DIAGNOSIS — A4151 Sepsis due to Escherichia coli [E. coli]: Principal | ICD-10-CM | POA: Diagnosis present

## 2016-07-09 DIAGNOSIS — I5043 Acute on chronic combined systolic (congestive) and diastolic (congestive) heart failure: Secondary | ICD-10-CM | POA: Diagnosis not present

## 2016-07-09 DIAGNOSIS — R1011 Right upper quadrant pain: Secondary | ICD-10-CM

## 2016-07-09 DIAGNOSIS — I10 Essential (primary) hypertension: Secondary | ICD-10-CM | POA: Diagnosis present

## 2016-07-09 DIAGNOSIS — Z8249 Family history of ischemic heart disease and other diseases of the circulatory system: Secondary | ICD-10-CM

## 2016-07-09 DIAGNOSIS — Z66 Do not resuscitate: Secondary | ICD-10-CM | POA: Diagnosis present

## 2016-07-09 DIAGNOSIS — R0602 Shortness of breath: Secondary | ICD-10-CM | POA: Diagnosis not present

## 2016-07-09 DIAGNOSIS — G9340 Encephalopathy, unspecified: Secondary | ICD-10-CM | POA: Diagnosis not present

## 2016-07-09 DIAGNOSIS — R509 Fever, unspecified: Secondary | ICD-10-CM | POA: Diagnosis not present

## 2016-07-09 DIAGNOSIS — Z951 Presence of aortocoronary bypass graft: Secondary | ICD-10-CM

## 2016-07-09 DIAGNOSIS — Z981 Arthrodesis status: Secondary | ICD-10-CM | POA: Diagnosis not present

## 2016-07-09 DIAGNOSIS — I701 Atherosclerosis of renal artery: Secondary | ICD-10-CM | POA: Diagnosis present

## 2016-07-09 DIAGNOSIS — M1990 Unspecified osteoarthritis, unspecified site: Secondary | ICD-10-CM | POA: Diagnosis present

## 2016-07-09 DIAGNOSIS — N179 Acute kidney failure, unspecified: Secondary | ICD-10-CM | POA: Diagnosis present

## 2016-07-09 DIAGNOSIS — N183 Chronic kidney disease, stage 3 unspecified: Secondary | ICD-10-CM | POA: Diagnosis present

## 2016-07-09 DIAGNOSIS — N3001 Acute cystitis with hematuria: Secondary | ICD-10-CM

## 2016-07-09 DIAGNOSIS — A419 Sepsis, unspecified organism: Secondary | ICD-10-CM | POA: Diagnosis present

## 2016-07-09 DIAGNOSIS — I509 Heart failure, unspecified: Secondary | ICD-10-CM | POA: Diagnosis not present

## 2016-07-09 DIAGNOSIS — Z95810 Presence of automatic (implantable) cardiac defibrillator: Secondary | ICD-10-CM | POA: Diagnosis not present

## 2016-07-09 DIAGNOSIS — F0390 Unspecified dementia without behavioral disturbance: Secondary | ICD-10-CM | POA: Diagnosis present

## 2016-07-09 DIAGNOSIS — R2681 Unsteadiness on feet: Secondary | ICD-10-CM | POA: Diagnosis not present

## 2016-07-09 DIAGNOSIS — G9341 Metabolic encephalopathy: Secondary | ICD-10-CM | POA: Diagnosis present

## 2016-07-09 DIAGNOSIS — Z96652 Presence of left artificial knee joint: Secondary | ICD-10-CM | POA: Diagnosis present

## 2016-07-09 DIAGNOSIS — R278 Other lack of coordination: Secondary | ICD-10-CM | POA: Diagnosis not present

## 2016-07-09 DIAGNOSIS — I7300 Raynaud's syndrome without gangrene: Secondary | ICD-10-CM | POA: Diagnosis present

## 2016-07-09 DIAGNOSIS — B9620 Unspecified Escherichia coli [E. coli] as the cause of diseases classified elsewhere: Secondary | ICD-10-CM | POA: Diagnosis present

## 2016-07-09 DIAGNOSIS — R2689 Other abnormalities of gait and mobility: Secondary | ICD-10-CM | POA: Diagnosis not present

## 2016-07-09 DIAGNOSIS — R279 Unspecified lack of coordination: Secondary | ICD-10-CM | POA: Diagnosis not present

## 2016-07-09 DIAGNOSIS — R262 Difficulty in walking, not elsewhere classified: Secondary | ICD-10-CM | POA: Diagnosis not present

## 2016-07-09 DIAGNOSIS — R402411 Glasgow coma scale score 13-15, in the field [EMT or ambulance]: Secondary | ICD-10-CM | POA: Diagnosis not present

## 2016-07-09 DIAGNOSIS — R41841 Cognitive communication deficit: Secondary | ICD-10-CM | POA: Diagnosis not present

## 2016-07-09 LAB — CBC WITH DIFFERENTIAL/PLATELET
Basophils Absolute: 0 10*3/uL (ref 0.0–0.1)
Basophils Absolute: 0 10*3/uL (ref 0.0–0.1)
Basophils Relative: 0 %
Basophils Relative: 0 %
Eosinophils Absolute: 0 10*3/uL (ref 0.0–0.7)
Eosinophils Absolute: 0 10*3/uL (ref 0.0–0.7)
Eosinophils Relative: 0 %
Eosinophils Relative: 0 %
HCT: 29.8 % — ABNORMAL LOW (ref 36.0–46.0)
HEMATOCRIT: 33.9 % — AB (ref 36.0–46.0)
HEMOGLOBIN: 11.2 g/dL — AB (ref 12.0–15.0)
Hemoglobin: 9.9 g/dL — ABNORMAL LOW (ref 12.0–15.0)
LYMPHS PCT: 4 %
Lymphocytes Relative: 6 %
Lymphs Abs: 0.4 10*3/uL — ABNORMAL LOW (ref 0.7–4.0)
Lymphs Abs: 0.8 10*3/uL (ref 0.7–4.0)
MCH: 30 pg (ref 26.0–34.0)
MCH: 30.3 pg (ref 26.0–34.0)
MCHC: 33 g/dL (ref 30.0–36.0)
MCHC: 33.2 g/dL (ref 30.0–36.0)
MCV: 90.9 fL (ref 78.0–100.0)
MCV: 91.1 fL (ref 78.0–100.0)
MONO ABS: 0.7 10*3/uL (ref 0.1–1.0)
MONOS PCT: 6 %
Monocytes Absolute: 1.2 10*3/uL — ABNORMAL HIGH (ref 0.1–1.0)
Monocytes Relative: 9 %
NEUTROS ABS: 9.9 10*3/uL — AB (ref 1.7–7.7)
NEUTROS PCT: 90 %
Neutro Abs: 11.2 10*3/uL — ABNORMAL HIGH (ref 1.7–7.7)
Neutrophils Relative %: 85 %
Platelets: 146 10*3/uL — ABNORMAL LOW (ref 150–400)
Platelets: 139 10*3/uL — ABNORMAL LOW (ref 150–400)
RBC: 3.73 MIL/uL — ABNORMAL LOW (ref 3.87–5.11)
RBC: 3.27 MIL/uL — ABNORMAL LOW (ref 3.87–5.11)
RDW: 14 % (ref 11.5–15.5)
RDW: 14.1 % (ref 11.5–15.5)
WBC: 11 10*3/uL — ABNORMAL HIGH (ref 4.0–10.5)
WBC: 13.2 10*3/uL — ABNORMAL HIGH (ref 4.0–10.5)

## 2016-07-09 LAB — BLOOD CULTURE ID PANEL (REFLEXED)
Acinetobacter baumannii: NOT DETECTED
CANDIDA ALBICANS: NOT DETECTED
CANDIDA GLABRATA: NOT DETECTED
CANDIDA TROPICALIS: NOT DETECTED
Candida krusei: NOT DETECTED
Candida parapsilosis: NOT DETECTED
Carbapenem resistance: NOT DETECTED
ENTEROBACTER CLOACAE COMPLEX: NOT DETECTED
ENTEROCOCCUS SPECIES: NOT DETECTED
ESCHERICHIA COLI: DETECTED — AB
Enterobacteriaceae species: DETECTED — AB
HAEMOPHILUS INFLUENZAE: NOT DETECTED
Klebsiella oxytoca: NOT DETECTED
Klebsiella pneumoniae: NOT DETECTED
LISTERIA MONOCYTOGENES: NOT DETECTED
NEISSERIA MENINGITIDIS: NOT DETECTED
Proteus species: NOT DETECTED
Pseudomonas aeruginosa: NOT DETECTED
STAPHYLOCOCCUS SPECIES: NOT DETECTED
STREPTOCOCCUS AGALACTIAE: NOT DETECTED
STREPTOCOCCUS PNEUMONIAE: NOT DETECTED
STREPTOCOCCUS SPECIES: NOT DETECTED
Serratia marcescens: NOT DETECTED
Staphylococcus aureus (BCID): NOT DETECTED
Streptococcus pyogenes: NOT DETECTED

## 2016-07-09 LAB — URINALYSIS, ROUTINE W REFLEX MICROSCOPIC
Bilirubin Urine: NEGATIVE
GLUCOSE, UA: NEGATIVE mg/dL
Ketones, ur: NEGATIVE mg/dL
Nitrite: POSITIVE — AB
PH: 5.5 (ref 5.0–8.0)
PROTEIN: 100 mg/dL — AB
Specific Gravity, Urine: 1.019 (ref 1.005–1.030)

## 2016-07-09 LAB — COMPREHENSIVE METABOLIC PANEL
ALBUMIN: 2.7 g/dL — AB (ref 3.5–5.0)
ALT: 20 U/L (ref 14–54)
AST: 33 U/L (ref 15–41)
Alkaline Phosphatase: 79 U/L (ref 38–126)
Anion gap: 14 (ref 5–15)
BUN: 60 mg/dL — AB (ref 6–20)
CHLORIDE: 100 mmol/L — AB (ref 101–111)
CO2: 23 mmol/L (ref 22–32)
CREATININE: 2.48 mg/dL — AB (ref 0.44–1.00)
Calcium: 8.5 mg/dL — ABNORMAL LOW (ref 8.9–10.3)
GFR calc Af Amer: 20 mL/min — ABNORMAL LOW (ref >60)
GFR calc non Af Amer: 17 mL/min — ABNORMAL LOW (ref >60)
GLUCOSE: 172 mg/dL — AB (ref 65–99)
Potassium: 3.6 mmol/L (ref 3.5–5.1)
SODIUM: 137 mmol/L (ref 135–145)
Total Bilirubin: 0.8 mg/dL (ref 0.3–1.2)
Total Protein: 6.1 g/dL — ABNORMAL LOW (ref 6.5–8.1)

## 2016-07-09 LAB — URINE MICROSCOPIC-ADD ON

## 2016-07-09 LAB — CREATININE, SERUM
Creatinine, Ser: 2.25 mg/dL — ABNORMAL HIGH (ref 0.44–1.00)
GFR calc Af Amer: 22 mL/min — ABNORMAL LOW (ref >60)
GFR calc non Af Amer: 19 mL/min — ABNORMAL LOW (ref >60)

## 2016-07-09 LAB — CREATININE, URINE, RANDOM: Creatinine, Urine: 125.8 mg/dL

## 2016-07-09 LAB — GLUCOSE, CAPILLARY
GLUCOSE-CAPILLARY: 101 mg/dL — AB (ref 65–99)
Glucose-Capillary: 140 mg/dL — ABNORMAL HIGH (ref 65–99)
Glucose-Capillary: 105 mg/dL — ABNORMAL HIGH (ref 65–99)

## 2016-07-09 LAB — MRSA PCR SCREENING: MRSA by PCR: POSITIVE — AB

## 2016-07-09 LAB — TSH: TSH: 0.864 u[IU]/mL (ref 0.350–4.500)

## 2016-07-09 LAB — I-STAT CG4 LACTIC ACID, ED
Lactic Acid, Venous: 2.55 mmol/L (ref 0.5–1.9)
Lactic Acid, Venous: 0.92 mmol/L (ref 0.5–1.9)

## 2016-07-09 LAB — LIPASE, BLOOD: Lipase: 15 U/L (ref 11–51)

## 2016-07-09 LAB — PROCALCITONIN: PROCALCITONIN: 16.74 ng/mL

## 2016-07-09 LAB — PROTIME-INR
INR: 1.25
Prothrombin Time: 15.8 seconds — ABNORMAL HIGH (ref 11.4–15.2)

## 2016-07-09 LAB — APTT: APTT: 30 s (ref 24–36)

## 2016-07-09 LAB — SODIUM, URINE, RANDOM: Sodium, Ur: 22 mmol/L

## 2016-07-09 MED ORDER — CEFTRIAXONE SODIUM 1 G IJ SOLR: 1.0000 g | INTRAMUSCULAR | Status: DC

## 2016-07-09 MED ORDER — SODIUM CHLORIDE 0.9 % IV BOLUS (SEPSIS)
500.0000 mL | Freq: Once | INTRAVENOUS | Status: AC
  Administered 2016-07-09: 500 mL via INTRAVENOUS

## 2016-07-09 MED ORDER — MEMANTINE HCL-DONEPEZIL HCL ER 28-10 MG PO CP24: 1.0000 | ORAL_CAPSULE | Freq: Every day | ORAL | Status: DC

## 2016-07-09 MED ORDER — VITAMIN D 1000 UNITS PO TABS
2000.0000 [IU] | ORAL_TABLET | Freq: Every day | ORAL | Status: DC
  Administered 2016-07-09 – 2016-07-13 (×5): 2000 [IU] via ORAL
  Filled 2016-07-09 (×6): qty 2

## 2016-07-09 MED ORDER — ONDANSETRON HCL 4 MG/2ML IJ SOLN: 4.0000 mg | Freq: Four times a day (QID) | INTRAMUSCULAR | Status: DC | PRN

## 2016-07-09 MED ORDER — SODIUM CHLORIDE 0.9 % IV SOLN
500.0000 mg | Freq: Two times a day (BID) | INTRAVENOUS | Status: DC
  Administered 2016-07-09 – 2016-07-11 (×3): 500 mg via INTRAVENOUS
  Filled 2016-07-09 (×7): qty 500

## 2016-07-09 MED ORDER — HEPARIN SODIUM (PORCINE) 5000 UNIT/ML IJ SOLN
5000.0000 [IU] | Freq: Three times a day (TID) | INTRAMUSCULAR | Status: DC
  Administered 2016-07-09 – 2016-07-13 (×13): 5000 [IU] via SUBCUTANEOUS
  Filled 2016-07-09 (×13): qty 1

## 2016-07-09 MED ORDER — NORTRIPTYLINE HCL 25 MG PO CAPS
25.0000 mg | ORAL_CAPSULE | Freq: Every day | ORAL | Status: DC
  Administered 2016-07-09 – 2016-07-13 (×5): 25 mg via ORAL
  Filled 2016-07-09 (×5): qty 1

## 2016-07-09 MED ORDER — ACETAMINOPHEN 325 MG PO TABS
650.0000 mg | ORAL_TABLET | Freq: Four times a day (QID) | ORAL | Status: DC | PRN
  Administered 2016-07-09 – 2016-07-11 (×2): 650 mg via ORAL
  Filled 2016-07-09 (×2): qty 2

## 2016-07-09 MED ORDER — INSULIN ASPART 100 UNIT/ML ~~LOC~~ SOLN
0.0000 [IU] | Freq: Three times a day (TID) | SUBCUTANEOUS | Status: DC
Start: 2016-07-09 — End: 2016-07-13
  Administered 2016-07-09 – 2016-07-10 (×2): 1 [IU] via SUBCUTANEOUS

## 2016-07-09 MED ORDER — LEVOTHYROXINE SODIUM 112 MCG PO TABS
112.0000 ug | ORAL_TABLET | Freq: Every day | ORAL | Status: DC
  Administered 2016-07-09 – 2016-07-13 (×5): 112 ug via ORAL
  Filled 2016-07-09 (×5): qty 1

## 2016-07-09 MED ORDER — MONTELUKAST SODIUM 10 MG PO TABS
10.0000 mg | ORAL_TABLET | Freq: Every day | ORAL | Status: DC
  Administered 2016-07-09 – 2016-07-13 (×5): 10 mg via ORAL
  Filled 2016-07-09 (×5): qty 1

## 2016-07-09 MED ORDER — MEMANTINE HCL ER 28 MG PO CP24
28.0000 mg | ORAL_CAPSULE | Freq: Every day | ORAL | Status: DC
  Administered 2016-07-09 – 2016-07-12 (×4): 28 mg via ORAL
  Filled 2016-07-09 (×5): qty 1

## 2016-07-09 MED ORDER — SODIUM CHLORIDE 0.9 % IV BOLUS (SEPSIS)
1000.0000 mL | Freq: Once | INTRAVENOUS | Status: AC
  Administered 2016-07-09: 1000 mL via INTRAVENOUS

## 2016-07-09 MED ORDER — CHLORHEXIDINE GLUCONATE CLOTH 2 % EX PADS
6.0000 | MEDICATED_PAD | Freq: Every day | CUTANEOUS | Status: DC
  Administered 2016-07-10 – 2016-07-13 (×3): 6 via TOPICAL

## 2016-07-09 MED ORDER — POTASSIUM GLUCONATE 595 MG PO CAPS: 1.0000 | ORAL_CAPSULE | Freq: Every day | ORAL | Status: DC

## 2016-07-09 MED ORDER — ONDANSETRON HCL 4 MG PO TABS: 4.0000 mg | ORAL_TABLET | Freq: Four times a day (QID) | ORAL | Status: DC | PRN

## 2016-07-09 MED ORDER — SODIUM CHLORIDE 0.9 % IV SOLN
INTRAVENOUS | Status: AC
  Administered 2016-07-09: 08:00:00 via INTRAVENOUS

## 2016-07-09 MED ORDER — INSULIN ASPART 100 UNIT/ML ~~LOC~~ SOLN: 0.0000 [IU] | Freq: Every day | SUBCUTANEOUS | Status: DC

## 2016-07-09 MED ORDER — DONEPEZIL HCL 10 MG PO TABS
10.0000 mg | ORAL_TABLET | Freq: Every day | ORAL | Status: DC
  Administered 2016-07-09 – 2016-07-12 (×4): 10 mg via ORAL
  Filled 2016-07-09 (×4): qty 1

## 2016-07-09 MED ORDER — DEXTROSE 5 % IV SOLN
1.0000 g | Freq: Once | INTRAVENOUS | Status: DC
  Filled 2016-07-09: qty 10

## 2016-07-09 MED ORDER — POTASSIUM CHLORIDE CRYS ER 10 MEQ PO TBCR
10.0000 meq | EXTENDED_RELEASE_TABLET | Freq: Every day | ORAL | Status: DC
  Administered 2016-07-09 – 2016-07-10 (×2): 10 meq via ORAL
  Filled 2016-07-09 (×2): qty 1

## 2016-07-09 MED ORDER — COQ10 100 MG PO CAPS: 100.0000 mg | ORAL_CAPSULE | Freq: Every day | ORAL | Status: DC

## 2016-07-09 MED ORDER — ACETAMINOPHEN 650 MG RE SUPP
650.0000 mg | Freq: Once | RECTAL | Status: AC
  Administered 2016-07-09: 650 mg via RECTAL
  Filled 2016-07-09: qty 1

## 2016-07-09 MED ORDER — PANTOPRAZOLE SODIUM 40 MG PO TBEC
40.0000 mg | DELAYED_RELEASE_TABLET | Freq: Every day | ORAL | Status: DC
  Administered 2016-07-09 – 2016-07-13 (×5): 40 mg via ORAL
  Filled 2016-07-09 (×5): qty 1

## 2016-07-09 MED ORDER — MUPIROCIN 2 % EX OINT
1.0000 "application " | TOPICAL_OINTMENT | Freq: Two times a day (BID) | CUTANEOUS | Status: DC
  Administered 2016-07-09 – 2016-07-13 (×9): 1 via NASAL
  Filled 2016-07-09 (×3): qty 22

## 2016-07-09 MED ORDER — SODIUM CHLORIDE 0.9 % IV SOLN
500.0000 mg | Freq: Once | INTRAVENOUS | Status: AC
  Administered 2016-07-09: 500 mg via INTRAVENOUS
  Filled 2016-07-09: qty 500

## 2016-07-09 NOTE — Care Management Note (Addendum)
Case Management Note  Patient Details  Name: [name] MRN: [id number] Date of Birth: [birth year]  Subjective/Objective:     Admitted with acute encephalopathy,sepisis/UTI. From independent living facility , [facility].  PCP: Marden Noble  Action/Plan: Return to home when medically stable. CM to f/u with disposition needs.  Expected Discharge Date:                   Expected Discharge Plan:    In-House Referral:  Clinical Social Work/ possible SNF placement  Discharge planning Services  CM Consult  Post Acute Care Choice:    Choice offered to:     DME Arranged:    DME Agency:     HH Arranged:    HH Agency:     Status of Service:  In process, will continue to follow  If discussed at Long Length of Stay Meetings, dates discussed:    Additional Comments: CM attempted to speak with pt regarding d/c planning however, pt is confused . CM called spouse, Gillon @ [phone], and learned # has been disconnected.     Gae Gallop [facility], RN 07/09/2016, 8:29 PM

## 2016-07-09 NOTE — ED Provider Notes (Signed)
[facility]-EMERGENCY DEPT Provider Note   CSN: [id number] Arrival date & time: 07/09/16  0515     History   Chief Complaint Chief Complaint  Patient presents with  . Fever  . Altered Mental Status    HPI  Blood pressure (!) 106/41, pulse 91, temperature (!) 104.1 F (40.1 C), temperature source Rectal, resp. rate 25, height 5\' 2"  (1.575 m), weight 45.8 kg, SpO2 99 %.  [name] is a 82 y.o. female with history of dementia, asthma, CHF, CAD brought in by EMS for evaluation of fever and altered mental status. Patient resides in SNF, triage note states short of breath, patient denies this. Recently started on Ceftin for UTI. Level V caveat secondary to dementia. Patient is DO NOT RESUSCITATE.  HPI  Past Medical History:  Diagnosis Date  . Anemia    takes iron 3 days per week  . Asthma   . Automatic implantable cardioverter-defibrillator in situ   . BBB (bundle branch block)    s/p BiV ICD implant  . CHF (congestive heart failure) (HCC)   . Chronic pulmonary disease   . Chronic systolic heart failure (HCC)    NYHA class II.  [name] COPD (chronic obstructive pulmonary disease) (HCC)   . Dizziness   . DJD (degenerative joint disease), cervical   . DJD (degenerative joint disease), lumbar   . FH: mitral valve repair    with 26 mm Edwards ring angioplasty,   . Full dentures   . GERD (gastroesophageal reflux disease)   . HTN (hypertension)   . Hx of cardiovascular stress test    Lexiscan Myoview (9/15):  Normal stress nuclear study.  LV Ejection Fraction: 76%  . Hyperthyroidism    following Graves disease  . Ischemic cardiomyopathy    severe. Left ventricular ejection fraction 20%.   . Neuromuscular scoliosis of thoracolumbar region    type of scoliosis was not specified.   . Pacemaker   . Pneumonia    hx  . PONV (postoperative nausea and vomiting)   . Raynaud's syndrome   . Renal artery stenosis (HCC)    Treated with angioplast in 1980 and 1987.   . S/P CABG (coronary  artery bypass graft) April 2012    Patient Active Problem List   Diagnosis Date Noted  . Sepsis (HCC) 07/09/2016  . UTI (urinary tract infection) 07/09/2016  . Acute encephalopathy 07/09/2016  . Nausea and vomiting 09/25/2015  . Mild dementia   . Hypertensive urgency 09/24/2015  . Eye swollen, left 11/12/2014  . Rash, right thigh 11/12/2014  . Femur fracture, left (HCC) 09/29/2014  . Hypothyroidism 09/29/2014  . Coronary atherosclerosis of native coronary artery 06/03/2014  . S/P mitral valve repair 06/03/2014  . Total knee replacement status 04/29/2014  . Dizziness 08/28/2013  . FH: mitral valve repair   . COPD (chronic obstructive pulmonary disease) (HCC)   . BBB (bundle branch block)   . Ischemic cardiomyopathy   . Automatic implantable cardioverter-defibrillator in situ 06/24/2012  . Preop respiratory exam 05/17/2012  . Chronic systolic heart failure (HCC) 03/01/2011  . Other specified forms of chronic ischemic heart disease 03/01/2011  . S/P CABG (coronary artery bypass graft) 12/25/2010  . SHORTNESS OF BREATH (SOB) 05/07/2009  . Hyperlipidemia 05/06/2009  . Essential hypertension 05/06/2009    Past Surgical History:  Procedure Laterality Date  . APPENDECTOMY    . BACK SURGERY    . BIV ICD GENERTAOR CHANGE OUT N/A 11/11/2014   Procedure: BIV ICD GENERTAOR CHANGE OUT;  Surgeon: Hillis Range, MD;  Location: [facility] CATH LAB;  Service: Cardiovascular;  Laterality: N/A;  . BREAST LUMPECTOMY     left breast  . carpal tunnel release    . CARPOMETACARPEL SUSPENSION PLASTY Right 11/12/2013   Procedure: SUSPENSION PLASTY RIGHT THUMB, TRAPEZIUM EXCISION;  Surgeon: Nicki Reaper, MD;  Location: [facility];  Service: Orthopedics;  Laterality: Right;  . CATARACT EXTRACTION    . CERVICAL FUSION     C5-6 and C6-7, C4-5 with titanium plates  . CORONARY ARTERY BYPASS GRAFT  2010   mvr/cabg  . ESOPHAGEAL MANOMETRY N/A 04/07/2013   Procedure: ESOPHAGEAL MANOMETRY (EM);   Surgeon: Charolett Bumpers, MD;  Location: [facility];  Service: Endoscopy;  Laterality: N/A;  . EYE SURGERY Bilateral    cataracts  . FACIAL COSMETIC SURGERY    . FEMUR IM NAIL Left 09/29/2014   Procedure: Affixus Trochanteric Femoral Nail;  Surgeon: Eldred Manges, MD;  Location: [facility];  Service: Orthopedics;  Laterality: Left;  . FINGER ARTHROPLASTY  07/10/2012   Procedure: FINGER ARTHROPLASTY;  Surgeon: Nicki Reaper, MD;  Location: [facility];  Service: Orthopedics;  Laterality: Right;  METACARPAL PHALANGEAL ARTHROPLASTIES RIGHT INDEX, MIDDLE, AND RING FINGERS   . HEMORRHOIDECTOMY WITH HEMORRHOID BANDING    . HERNIA REPAIR     umbilical  . implantation of ICD  2010   BiV ICD implant (SJM) by Dr Amil Amen 03/2009  . laminotomy/foraminotomy     with decompression of the L4 nerve root   . left knee arthroscopic    . lung mass removal Right   . precancerous growth     tops of ear removed. bilateral.   . PTCA     of bilateral renal arteries  . renal artery ballon dilation    . renal artery ballon dilation     x2  . REPAIR EXTENSOR TENDON  07/10/2012   Procedure: REPAIR EXTENSOR TENDON;  Surgeon: Nicki Reaper, MD;  Location: [facility];  Service: Orthopedics;  Laterality: Right;  METACARPAL PHALANGEAL REPLACEMENT ARTHROPLASTIES RIGHT INDEX, MIDDLE, AND RING FINGERS    . ROTATOR CUFF REPAIR     right and left  . SYMPATHECTOMY    . TENDON TRANSFER Right 11/12/2013   Procedure: RIGHT ABDUCTOR POLLICUS LONGUS TENDON TRANSFER;  Surgeon: Nicki Reaper, MD;  Location: [facility];  Service: Orthopedics;  Laterality: Right;  . TONSILLECTOMY    . TONSILLECTOMY    . TOTAL ABDOMINAL HYSTERECTOMY    . TOTAL KNEE ARTHROPLASTY Left 04/29/2014   Procedure: LEFT TOTAL KNEE ARTHROPLASTY;  Surgeon: Nadara Mustard, MD;  Location: [facility] OR;  Service: Orthopedics;  Laterality: Left;  . UMBILICAL HERNIA REPAIR    . WEDGE RESECTION  2001   for the right upper lobe for  Aspergillus treatement.  Dr. Edwyna Shell apprix 2001.    OB History    No data available       Home Medications    Prior to Admission medications   Medication Sig Start Date End Date Taking? Authorizing Provider  acetaminophen (TYLENOL) 500 MG tablet Take 500 mg by mouth 2 (two) times daily.    Yes Historical Provider, MD  ALPRAZolam Prudy Feeler) 0.5 MG tablet Take 0.5 mg by mouth at bedtime as needed for sleep. 07/06/16  Yes Historical Provider, MD  amLODipine (NORVASC) 5 MG tablet Take 1 tablet (5 mg total) by mouth daily. 09/26/15  Yes Nishant Dhungel, MD  Cholecalciferol (VITAMIN D3) 2000 UNITS capsule  Take 2,000 Units by mouth daily.    Yes Historical Provider, MD  Coenzyme Q10 (COQ10) 100 MG CAPS Take 100 mg by mouth daily.    Yes Historical Provider, MD  fluconazole (DIFLUCAN) 100 MG tablet Take 100 mg by mouth once a week. FRIDAYS   Yes Historical Provider, MD  levothyroxine (SYNTHROID, LEVOTHROID) 112 MCG tablet Take 112 mcg by mouth daily before breakfast. 07/01/16  Yes Historical Provider, MD  NAMZARIC 28-10 MG CP24 Take 1 capsule by mouth daily. 08/30/15  Yes Historical Provider, MD  nortriptyline (PAMELOR) 25 MG capsule Take 25 mg by mouth daily.    Yes Historical Provider, MD  pantoprazole (PROTONIX) 40 MG tablet Take 40 mg by mouth daily.    Yes Historical Provider, MD  Potassium Gluconate 595 MG CAPS Take 1 capsule by mouth daily.   Yes Historical Provider, MD  UNABLE TO FIND Med Pass 2.0- Drink 4 ounces by mouth two times a day   Yes Historical Provider, MD  valsartan (DIOVAN) 160 MG tablet Take 160 mg by mouth 2 (two) times daily. Reported on 09/24/2015   Yes Historical Provider, MD  carvedilol (COREG) 12.5 MG tablet Take 1 tablet (12.5 mg total) by mouth 2 (two) times daily. 04/01/14   Jake Bathe, MD  cefUROXime (CEFTIN) 500 MG tablet Take 500 mg by mouth every 12 (twelve) hours. For 5 days (Start date 07/09/16)    Historical Provider, MD  montelukast (SINGULAIR) 10 MG tablet Take  10 mg by mouth daily.     Historical Provider, MD  Probiotic Product (PROBIOTIC DAILY PO) Take 1 capsule by mouth daily.    Historical Provider, MD    Family History Family History  Problem Relation Age of Onset  . Heart disease Father   . Hypertension Father   . Colon cancer      grandmother    Social History Social History  Substance Use Topics  . Smoking status: Never Smoker  . Smokeless tobacco: Never Used     Comment: passive smoker from birth to age 68 (mom and husband)  . Alcohol use No     Allergies   Alprazolam; Doxycycline; Fulvicin p-g [griseofulvin]; Hydrocodone; Ketoconazole; Serevent [salmeterol]; Calcitonin (salmon); Morphine and related; Amitriptyline; Cefuroxime axetil; Co q 10 [coenzyme q10]; Hydrocodone-acetaminophen; Lorcet [hydrocodone-acetaminophen]; Other; Percodan [oxycodone-aspirin]; Plendil [felodipine]; Sulfamethoxazole-trimethoprim; Sulfonamide derivatives; Trovan [alatrofloxacin]; Ventolin [albuterol]; Aspirin; Cephalexin; Ciprofloxacin; Clonazepam; Erythromycin; Hydromorphone; Miacalcin [calcitonin (salmon)]; Oxycodone-aspirin; Penicillins; and Tapazole [methimazole]   Review of Systems Review of Systems  10 systems reviewed and found to be negative, except as noted in the HPI.   Physical Exam Updated Vital Signs BP (!) 108/54   Pulse 92   Temp 98.8 F (37.1 C) (Oral)   Resp 21   Ht 5\' 2"  (1.575 m)   Wt 45.8 kg   SpO2 99%   BMI 18.47 kg/m   Physical Exam  Constitutional: She appears well-developed and well-nourished. No distress.  HENT:  Head: Normocephalic and atraumatic.  Mouth/Throat: Oropharynx is clear and moist.  Eyes: Conjunctivae and EOM are normal. Pupils are equal, round, and reactive to light.  Neck: Normal range of motion.  Moving neck normally.  Cardiovascular: Normal rate, regular rhythm and intact distal pulses.   Pulmonary/Chest: Effort normal and breath sounds normal. No respiratory distress. She has no wheezes. She  has no rales. She exhibits no tenderness.  Abdominal: Soft. She exhibits no distension and no mass. There is no tenderness. There is no rebound and no guarding. No hernia.  Genitourinary:  Genitourinary Comments: No CVA tenderness to percussion  Musculoskeletal: Normal range of motion.  Neurological: She is alert.  Pleasantly demented, oriented to self  Skin: Capillary refill takes less than 2 seconds. She is not diaphoretic.  Psychiatric: She has a normal mood and affect.  Nursing note and vitals reviewed.    ED Treatments / Results  Labs (all labs ordered are listed, but only abnormal results are displayed) Labs Reviewed  COMPREHENSIVE METABOLIC PANEL - Abnormal; Notable for the following:       Result Value   Chloride 100 (*)    Glucose, Bld 172 (*)    BUN 60 (*)    Creatinine, Ser 2.48 (*)    Calcium 8.5 (*)    Total Protein 6.1 (*)    Albumin 2.7 (*)    GFR calc non Af Amer 17 (*)    GFR calc Af Amer 20 (*)    All other components within normal limits  CBC WITH DIFFERENTIAL/PLATELET - Abnormal; Notable for the following:    WBC 11.0 (*)    RBC 3.73 (*)    Hemoglobin 11.2 (*)    HCT 33.9 (*)    Platelets 146 (*)    Neutro Abs 9.9 (*)    Lymphs Abs 0.4 (*)    All other components within normal limits  URINALYSIS, ROUTINE W REFLEX MICROSCOPIC (NOT AT [facility]) - Abnormal; Notable for the following:    APPearance CLOUDY (*)    Hgb urine dipstick MODERATE (*)    Protein, ur 100 (*)    Nitrite POSITIVE (*)    Leukocytes, UA MODERATE (*)    All other components within normal limits  URINE MICROSCOPIC-ADD ON - Abnormal; Notable for the following:    Squamous Epithelial / LPF 0-5 (*)    Bacteria, UA MANY (*)    All other components within normal limits  I-STAT CG4 LACTIC ACID, ED - Abnormal; Notable for the following:    Lactic Acid, Venous 2.55 (*)    All other components within normal limits  CULTURE, BLOOD (ROUTINE X 2)  CULTURE, BLOOD (ROUTINE X 2)  URINE CULTURE     EKG  EKG Interpretation  Date/Time:  Sunday July 09 2016 05:23:16 EDT Ventricular Rate:  94 PR Interval:    QRS Duration: 118 QT Interval:  356 QTC Calculation: 446 R Axis:   -113 Text Interpretation:  Sinus rhythm Multiform ventricular premature complexes Left anterior fascicular block Anterior infarct, old ST elevation suggests acute pericarditis Confirmed by DELO  MD, DOUGLAS ([id number]) on 07/09/2016 7:14:31 AM       Radiology Dg Chest 2 View  Result Date: 07/09/2016 CLINICAL DATA:  Shortness of breath today. EXAM: CHEST  2 VIEW COMPARISON:  09/24/2015 FINDINGS: Multi lead left-sided pacemaker remains in place, leads unchanged in position. Post median sternotomy with prosthetic mitral valve. Stable cardiomediastinal contours with perihilar scarring. Atherosclerosis of the thoracic aorta. Chain sutures noted in the right upper lung. No pulmonary edema, focal consolidation or pleural effusion. No acute osseous abnormality. Postsurgical change in the cervical spine is partially included. IMPRESSION: 1. No acute abnormality. 2. Thoracic aortic atherosclerosis. Electronically Signed   By: Rubye Oaks M.D.   On: 07/09/2016 06:04    Procedures Procedures (including critical care time)  Medications Ordered in ED Medications  cefTRIAXone (ROCEPHIN) 1 g in dextrose 5 % 50 mL IVPB (not administered)  sodium chloride 0.9 % bolus 1,000 mL (1,000 mLs Intravenous New Bag/Given 07/09/16 0711)    And  sodium chloride 0.9 % bolus 500 mL (500 mLs Intravenous New Bag/Given 07/09/16 0711)  acetaminophen (TYLENOL) suppository 650 mg (650 mg Rectal Given 07/09/16 [id number])     Initial Impression / Assessment and Plan / ED Course  I have reviewed the triage vital signs and the nursing notes.  Pertinent labs & imaging results that were available during my care of the patient were reviewed by me and considered in my medical decision making (see chart for details).  Clinical Course     Vitals:   07/09/16 0615 07/09/16 0630 07/09/16 0645 07/09/16 0700  BP: (!) 108/41 (!) 94/41 (!) 108/54   Pulse: 66 68 92   Resp: 23 22 21    Temp:    98.8 F (37.1 C)  TempSrc:    Oral  SpO2: 99% 99% 99%   Weight:      Height:        Medications  cefTRIAXone (ROCEPHIN) 1 g in dextrose 5 % 50 mL IVPB (not administered)  sodium chloride 0.9 % bolus 1,000 mL (1,000 mLs Intravenous New Bag/Given 07/09/16 0711)    And  sodium chloride 0.9 % bolus 500 mL (500 mLs Intravenous New Bag/Given 07/09/16 0711)  acetaminophen (TYLENOL) suppository 650 mg (650 mg Rectal Given 07/09/16 0611)    Tashia I Maurine MinisterDennis is 82 y.o. female who is DO NOT RESUSCITATE from SNF coming in for fever and altered mental status. Febrile to 104.1 elevated lactic acid, patient's blood pressure is strong 106. Urinalysis consistent with infection, this is not seem to be a pyelonephritis clinically. Patient is coming from SNF however, she has an extensive list of allergies, I discussed this with pharmacist Tammy SoursGreg who recommends starting her on Rocephin for a community-acquired UTI with urosepsis. With a cat, creatinine 2.48 up from her normal level of 1-1/2. Hemoconcentrated with a BUN of 60. 30 mL per KG initiated Patient hydrated and will need admission.  Case discussed with NP Black who accepts admission on behalf of attending physician Dr. Melynda RippleHobbs.  Further discuss with pharmacy who recommends Primaxin for broad spectrum coverage with her given allergies. She does show some mild tenderness to palpation in the upper quadrants, will obtain ultrasound and get lipase, NP Black updated.  Final Clinical Impressions(s) / ED Diagnoses   Final diagnoses:  Sepsis, due to unspecified organism [facility](HCC)  Acute cystitis with hematuria  [facility](HCC)       Wynetta EmeryNicole Zhane Donlan, PA-C 07/09/16 0716    Wynetta EmeryNicole Analleli Gierke, PA-C 07/09/16 1311    Glynn OctaveStephen Rancour, MD 07/09/16 1728

## 2016-07-09 NOTE — H&P (Signed)
History and Physical    Warrene I Ahmad WUJ:[id number] DOB: Dec 31, 1932 DOA: 07/09/2016  PCP: Pearla DubonnetGATES,ROBERT NEVILL, MD [facility]  Chief Complaint: fever/ams  HPI: Locklyn I Maurine MinisterDennis is a pleasantly demented 82 y.o. female with medical history significant for asthma, CHF, CAD status post CABG 2012, renal artery stenosis, pacemaker, hypertension, anemia presents to the emergency Department chief complaint fever and altered mental status. Initial evaluation in the emergency department reveals sepsis likely related to urinary tract infection.  Information is obtained from the patient and the chart keeping in mind that information from the patient may be unreliable due to dementia. Chart indicates patient recently diagnosed with urinary tract infection and was provided [facility]in. [facility]. Patient denies any headache dizziness chest pain palpitation shortness of breath. Denies any abdominal pain nausea vomiting dysuria hematuria frequency or urgency. Denies lower extremity edema or any recent falls.    ED Course: In the emergency department she has a temperature of 104 slightly soft blood pressure tachypnea somewhat lethargic she is provided with vigorous IV fluids and Rocephin due to her many medication allergies.  Review of Systems: As per HPI otherwise 10 point review of systems negative.   Ambulatory Status: He reports ambulating independently with a steady gait no recent falls  Past Medical History:  Diagnosis Date  . Anemia    takes iron 3 days per week  . Asthma   . Automatic implantable cardioverter-defibrillator in situ   . BBB (bundle branch block)    s/p BiV ICD implant  . CHF (congestive heart failure) (HCC)   . Chronic pulmonary disease   . Chronic systolic heart failure (HCC)    NYHA class II.  [name]. COPD (chronic obstructive pulmonary disease) (HCC)   . Dizziness   . DJD (degenerative joint disease), cervical     . DJD (degenerative joint disease), lumbar   . FH: mitral valve repair    with 26 mm Edwards ring angioplasty,   . Full dentures   . GERD (gastroesophageal reflux disease)   . HTN (hypertension)   . Hx of cardiovascular stress test    Lexiscan Myoview (9/15):  Normal stress nuclear study.  LV Ejection Fraction: 76%  . Hyperthyroidism    following Graves disease  . Ischemic cardiomyopathy    severe. Left ventricular ejection fraction 20%.   . Neuromuscular scoliosis of thoracolumbar region    type of scoliosis was not specified.   . Pacemaker   . Pneumonia    hx  . PONV (postoperative nausea and vomiting)   . Raynaud's syndrome   . Renal artery stenosis (HCC)    Treated with angioplast in 1980 and 1987.   . S/P CABG (coronary artery bypass graft) April 2012    Past Surgical History:  Procedure Laterality Date  . APPENDECTOMY    . BACK SURGERY    . BIV ICD GENERTAOR CHANGE OUT N/A 11/11/2014   Procedure: BIV ICD GENERTAOR CHANGE OUT;  Surgeon: Hillis RangeJames Allred, MD;  [facility];  Service: Cardiovascular;  Laterality: N/A;  . BREAST LUMPECTOMY     left breast  . carpal tunnel release    . CARPOMETACARPEL SUSPENSION PLASTY Right 11/12/2013   Procedure: SUSPENSION PLASTY RIGHT THUMB, TRAPEZIUM EXCISION;  Surgeon: Nicki ReaperGary R Kuzma, MD;  Location: [facility];  Service: Orthopedics;  Laterality: Right;  . CATARACT EXTRACTION    . CERVICAL FUSION     C5-6 and C6-7, C4-5 with  titanium plates  . CORONARY ARTERY BYPASS GRAFT  2010   mvr/cabg  . ESOPHAGEAL MANOMETRY N/A 04/07/2013   Procedure: ESOPHAGEAL MANOMETRY (EM);  Surgeon: Charolett Bumpers, MD;  Location: [facility];  Service: Endoscopy;  Laterality: N/A;  . EYE SURGERY Bilateral    cataracts  . FACIAL COSMETIC SURGERY    . FEMUR IM NAIL Left 09/29/2014   Procedure: Affixus Trochanteric Femoral Nail;  Surgeon: Eldred Manges, MD;  Location: [facility] ORS;  Service: Orthopedics;  Laterality: Left;  . FINGER ARTHROPLASTY   07/10/2012   Procedure: FINGER ARTHROPLASTY;  Surgeon: Nicki Reaper, MD;  Location: [facility];  Service: Orthopedics;  Laterality: Right;  METACARPAL PHALANGEAL ARTHROPLASTIES RIGHT INDEX, MIDDLE, AND RING FINGERS   . HEMORRHOIDECTOMY WITH HEMORRHOID BANDING    . HERNIA REPAIR     umbilical  . implantation of ICD  2010   BiV ICD implant (SJM) by Dr Amil Amen 03/2009  . laminotomy/foraminotomy     with decompression of the L4 nerve root   . left knee arthroscopic    . lung mass removal Right   . precancerous growth     tops of ear removed. bilateral.   . PTCA     of bilateral renal arteries  . renal artery ballon dilation    . renal artery ballon dilation     x2  . REPAIR EXTENSOR TENDON  07/10/2012   Procedure: REPAIR EXTENSOR TENDON;  Surgeon: Nicki Reaper, MD;  Location: [facility];  Service: Orthopedics;  Laterality: Right;  METACARPAL PHALANGEAL REPLACEMENT ARTHROPLASTIES RIGHT INDEX, MIDDLE, AND RING FINGERS    . ROTATOR CUFF REPAIR     right and left  . SYMPATHECTOMY    . TENDON TRANSFER Right 11/12/2013   Procedure: RIGHT ABDUCTOR POLLICUS LONGUS TENDON TRANSFER;  Surgeon: Nicki Reaper, MD;  Location: [facility];  Service: Orthopedics;  Laterality: Right;  . TONSILLECTOMY    . TONSILLECTOMY    . TOTAL ABDOMINAL HYSTERECTOMY    . TOTAL KNEE ARTHROPLASTY Left 04/29/2014   Procedure: LEFT TOTAL KNEE ARTHROPLASTY;  Surgeon: Nadara Mustard, MD;  Location: [facility] OR;  Service: Orthopedics;  Laterality: Left;  . UMBILICAL HERNIA REPAIR    . WEDGE RESECTION  2001   for the right upper lobe for Aspergillus treatement.  Dr. Edwyna Shell apprix 2001.    Social History   Social History  . Marital status: Married    Spouse name: N/A  . Number of children: N/A  . Years of education: N/A   Occupational History  . Not on file.   Social History Main Topics  . Smoking status: Never Smoker  . Smokeless tobacco: Never Used     Comment: passive  smoker from birth to age 40 (mom and husband)  . Alcohol use No  . Drug use: No  . Sexual activity: Not on file   Other Topics Concern  . Not on file   Social History Narrative   Married and lives in [place].  Retired    Allergies  Allergen Reactions  . Alprazolam Other (See Comments)    REACTION: ulcer's in mouth and extreme constipation  . Doxycycline Other (See Comments)    REACTION: severe rash over entire body  . Fulvicin P-G [Griseofulvin] Other (See Comments)    Severe headaches  . Hydrocodone Other (See Comments)    REACTION: nausea and totally out of it  . Ketoconazole Other (See Comments)    REACTION: terribly weak, voice  shook, and dizziness  . Serevent [Salmeterol] Other (See Comments)    Shaking and weakness  . Calcitonin (Salmon) Other (See Comments)    REACTION: rash over entire body  . Morphine And Related Nausea And Vomiting  . Amitriptyline Other (See Comments)    Lethargy   . Cefuroxime Axetil Other (See Comments)    REACTION: either rash or diarrhea  . Co Q 10 [Coenzyme Q10] Other (See Comments)    Pruritus   . Hydrocodone-Acetaminophen Nausea And Vomiting    Lorcet and Percodan  . Lorcet [Hydrocodone-Acetaminophen] Other (See Comments)    Reaction forgotten  . Other     Ketacosol= terribly weak  . Percodan [Oxycodone-Aspirin] Other (See Comments)    Dizziness   . Plendil [Felodipine] Swelling    Causes ankle edema  . Sulfamethoxazole-Trimethoprim Other (See Comments)    REACTION: either rash or diarrhea  . Sulfonamide Derivatives Other (See Comments)    REACTION: reaction forgotten  . Trovan [Alatrofloxacin] Nausea And Vomiting  . Ventolin [Albuterol] Nausea And Vomiting and Other (See Comments)    Causes shakiness too  . Aspirin Other (See Comments)    REACTION: upsets stomach  . Cephalexin Rash  . Ciprofloxacin Rash  . Clonazepam Other (See Comments)    REACTION: 1/2 pill makes grogginess next day  . Erythromycin Rash  .  Hydromorphone Rash  . Miacalcin [Calcitonin (Salmon)] Rash    Severe all-over body rash  . Oxycodone-Aspirin Other (See Comments)    REACTION: nausea  . Penicillins Rash  . Tapazole [Methimazole] Rash    Family History  Problem Relation Age of Onset  . Heart disease Father   . Hypertension Father   . Colon cancer      grandmother    Prior to Admission medications   Medication Sig Start Date End Date Taking? Authorizing Provider  acetaminophen (TYLENOL) 500 MG tablet Take 500 mg by mouth 2 (two) times daily.    Yes Historical Provider, MD  ALPRAZolam Prudy Feeler) 0.5 MG tablet Take 0.5 mg by mouth at bedtime as needed for sleep. 07/06/16  Yes Historical Provider, MD  amLODipine (NORVASC) 5 MG tablet Take 1 tablet (5 mg total) by mouth daily. 09/26/15  Yes Nishant Dhungel, MD  Cholecalciferol (VITAMIN D3) 2000 UNITS capsule Take 2,000 Units by mouth daily.    Yes Historical Provider, MD  Coenzyme Q10 (COQ10) 100 MG CAPS Take 100 mg by mouth daily.    Yes Historical Provider, MD  fluconazole (DIFLUCAN) 100 MG tablet Take 100 mg by mouth once a week. FRIDAYS   Yes Historical Provider, MD  levothyroxine (SYNTHROID, LEVOTHROID) 112 MCG tablet Take 112 mcg by mouth daily before breakfast. 07/01/16  Yes Historical Provider, MD  NAMZARIC 28-10 MG CP24 Take 1 capsule by mouth daily. 08/30/15  Yes Historical Provider, MD  nortriptyline (PAMELOR) 25 MG capsule Take 25 mg by mouth daily.    Yes Historical Provider, MD  pantoprazole (PROTONIX) 40 MG tablet Take 40 mg by mouth daily.    Yes Historical Provider, MD  Potassium Gluconate 595 MG CAPS Take 1 capsule by mouth daily.   Yes Historical Provider, MD  UNABLE TO FIND Med Pass 2.0- Drink 4 ounces by mouth two times a day   Yes Historical Provider, MD  valsartan (DIOVAN) 160 MG tablet Take 160 mg by mouth 2 (two) times daily. Reported on 09/24/2015   Yes Historical Provider, MD  carvedilol (COREG) 12.5 MG tablet Take 1 tablet (12.5 mg total) by mouth 2  (two)  times daily. 04/01/14   Jake Bathe, MD  cefUROXime (CEFTIN) 500 MG tablet Take 500 mg by mouth every 12 (twelve) hours. For 5 days (Start date 07/09/16)    Historical Provider, MD  montelukast (SINGULAIR) 10 MG tablet Take 10 mg by mouth daily.     Historical Provider, MD  Probiotic Product (PROBIOTIC DAILY PO) Take 1 capsule by mouth daily.    Historical Provider, MD    Physical Exam: Vitals:   07/09/16 0645 07/09/16 0700 07/09/16 0700 07/09/16 0716  BP: (!) 108/54 (!) 106/39  (!) 101/34  Pulse: 92 64  95  Resp: 21 24  20   Temp:   98.8 F (37.1 C)   TempSrc:   Oral   SpO2: 99% 100%  100%  Weight:      Height:         General:  Appears calm and comfortable, somewhat thin and frail appearing Eyes:  PERRL, EOMI, normal lids, iris ENT:  grossly normal hearing, lips & tongue, his membranes of her mouth are pink slightly dry Neck:  no LAD, masses or thyromegaly Cardiovascular:  RRR, no m/r/g. No LE edema. Pedal pulses present and palpable Respiratory:  CTA bilaterally, no w/r/r. Normal respiratory effort. Abdomen:  soft, ntnd, positive bowel sounds throughout no guarding or rebounding Skin:  no rash or induration seen on limited exam Musculoskeletal:  grossly normal tone BUE/BLE, good ROM, no bony abnormality Psychiatric:  grossly normal mood and affect, speech fluent and appropriate, AOx3 Neurologic:  CN 2-12 grossly intact, moves all extremities in coordinated fashion, sensation intact. Alert and oriented to self only answers questions appropriately follows commands  Labs on Admission: I have personally reviewed following labs and imaging studies  CBC:  Recent Labs Lab 07/09/16 0530  WBC 11.0*  NEUTROABS 9.9*  HGB 11.2*  HCT 33.9*  MCV 90.9  PLT 146*   Basic Metabolic Panel:  Recent Labs Lab 07/09/16 0530  NA 137  K 3.6  CL 100*  CO2 23  GLUCOSE 172*  BUN 60*  CREATININE 2.48*  CALCIUM 8.5*   GFR: Estimated Creatinine Clearance: 12.9 mL/min (by C-G  formula based on SCr of 2.48 mg/dL (H)). Liver Function Tests:  Recent Labs Lab 07/09/16 0530  AST 33  ALT 20  ALKPHOS 79  BILITOT 0.8  PROT 6.1*  ALBUMIN 2.7*   No results for input(s): LIPASE, AMYLASE in the last 168 hours. No results for input(s): AMMONIA in the last 168 hours. Coagulation Profile: No results for input(s): INR, PROTIME in the last 168 hours. Cardiac Enzymes: No results for input(s): CKTOTAL, CKMB, CKMBINDEX, TROPONINI in the last 168 hours. BNP (last 3 results) No results for input(s): PROBNP in the last 8760 hours. HbA1C: No results for input(s): HGBA1C in the last 72 hours. CBG: No results for input(s): GLUCAP in the last 168 hours. Lipid Profile: No results for input(s): CHOL, HDL, LDLCALC, TRIG, CHOLHDL, LDLDIRECT in the last 72 hours. Thyroid Function Tests: No results for input(s): TSH, T4TOTAL, FREET4, T3FREE, THYROIDAB in the last 72 hours. Anemia Panel: No results for input(s): VITAMINB12, FOLATE, FERRITIN, TIBC, IRON, RETICCTPCT in the last 72 hours. Urine analysis:    Component Value Date/Time   COLORURINE YELLOW 07/09/2016 0541   APPEARANCEUR CLOUDY (A) 07/09/2016 0541   LABSPEC 1.019 07/09/2016 0541   PHURINE 5.5 07/09/2016 0541   GLUCOSEU NEGATIVE 07/09/2016 0541   HGBUR MODERATE (A) 07/09/2016 0541   BILIRUBINUR NEGATIVE 07/09/2016 0541   KETONESUR NEGATIVE 07/09/2016 0541   PROTEINUR  100 (A) 07/09/2016 0541   UROBILINOGEN 1.0 05/14/2014 1522   NITRITE POSITIVE (A) 07/09/2016 0541   LEUKOCYTESUR MODERATE (A) 07/09/2016 0541    Creatinine Clearance: Estimated Creatinine Clearance: 12.9 mL/min (by C-G formula based on SCr of 2.48 mg/dL (H)).  Sepsis Labs: @LABRCNTIP (procalcitonin:4,lacticidven:4) ) Recent Results (from the past 240 hour(s))  Culture, blood (Routine x 2)     Status: None (Preliminary result)   Collection Time: 07/09/16  5:30 AM  Result Value Ref Range Status   Specimen Description BLOOD LEFT ANTECUBITAL  Final    Special Requests BOTTLES DRAWN AEROBIC AND ANAEROBIC  5CC  Final   Culture PENDING  Incomplete   Report Status PENDING  Incomplete     Radiological Exams on Admission: Dg Chest 2 View  Result Date: 07/09/2016 CLINICAL DATA:  Shortness of breath today. EXAM: CHEST  2 VIEW COMPARISON:  09/24/2015 FINDINGS: Multi lead left-sided pacemaker remains in place, leads unchanged in position. Post median sternotomy with prosthetic mitral valve. Stable cardiomediastinal contours with perihilar scarring. Atherosclerosis of the thoracic aorta. Chain sutures noted in the right upper lung. No pulmonary edema, focal consolidation or pleural effusion. No acute osseous abnormality. Postsurgical change in the cervical spine is partially included. IMPRESSION: 1. No acute abnormality. 2. Thoracic aortic atherosclerosis. Electronically Signed   By: Rubye Oaks M.D.   On: 07/09/2016 06:04    EKG: Independently reviewed. Sinus rhythm Multiform ventricular premature complexes Left anterior fascicular block Anterior infarct, old ST elevation suggests acute pericarditis  Assessment/Plan Principal Problem:   Sepsis (HCC) Active Problems:   Essential hypertension   Chronic systolic heart failure (HCC)   Automatic implantable cardioverter-defibrillator in situ   COPD (chronic obstructive pulmonary disease) (HCC)   Hypothyroidism   UTI (urinary tract infection)   Acute encephalopathy   Acute renal failure superimposed on stage 3 chronic kidney disease (HCC)   Hyperglycemia   #1. Sepsis. Related to urinary tract infection. Lactic acid elevated temperature 104, tachypnea, acute encephalopathy in setting dementia worsening ckd. Chest xray unremarkable -Admit to MedSurg -Follow urine culture -Pharmacy consulted initially recommended Rocephin that has been changed to primaxin - Follow Lipase and abdominal ultrasound initiated by ED -Track lactic acid -Follow blood cultures -Continue IV fluids -Hold any  antihypertensives for now -Monitor  #2. Acute encephalopathy in the setting of dementia. Likely secondary to #1. Patient's baseline is alert and oriented to self only. Ask questions appropriately follows commands is able to make her once a needs known. Appears to be close to baseline on admission. No focal deficits -See #1 -Continue home meds  3. Urinary tract infection. Urinalysis consistent with UTI. She denies dysuria hematuria. -Follow urine culture -She has many allergies initially Rocephin recommended by pharmacy and this was changed to primaxin -Monitor urine output  #4. Acute renal failure superimposed on stage III chronic kidney disease. Creatinine 2.48 on admission. Appears to be above her baseline.. She does have a history of renal artery stenosis. -Hold nephrotoxins -Gentle IV fluids -Monitor urine output -Recheck in the morning -If no improvement consider ultrasound  #5. Chronic systolic heart failure/status post ICD implant. Compensated. Echo done February 2016 revealed EF of 75-80% normal wall motion no regional wall abnormalities. Chart review indicates device interrogated September 2017. Home medications include Coreg. -Monitor intake and output -Obtain daily weights -Hold Coreg for now do to somewhat soft blood pressure -Monitor  6. Hypertension. Blood pressure somewhat soft in the emergency department. Home medications include amlodipine, valsartan, Coreg, -We'll hold these medications for now -  Monitor blood pressure closely -We'll resume amlodipine and Coreg as indicated  #7. Hypothyroidism. -Obtain a TSH -Continue Synthroid  #8. COPD/asthma. Not on oxygen at home. Appears stable at baseline. Chest x-ray as noted above -Continue home inhalers  #9. Hyperglycemia. Serum glucose 172 on admission. Chart review indicates no documented history of diabetes. -Obtain a hemoglobin A1c -Sliding scale insulin as indicated  DVT prophylaxis: heparin  Code Status: dnr    Family Communication: none present  Disposition Plan: back to [employer] home  Consults called: none  Admission status: inpatient    Gwenyth Bender MD [facility]  If 7PM-7AM, please contact night-coverage [email or web address] Password [email or web address]  07/09/2016, 8:04 AM

## 2016-07-09 NOTE — ED Notes (Signed)
CRITICAL VALUE ALERT  Critical value received: Lactic 2.55

## 2016-07-09 NOTE — Progress Notes (Signed)
PHARMACY - PHYSICIAN COMMUNICATION CRITICAL VALUE ALERT - BLOOD CULTURE IDENTIFICATION (BCID)  Results for orders placed or performed during the hospital encounter of 07/09/16  Blood Culture ID Panel (Reflexed) (Collected: 07/09/2016  5:30 AM)  Result Value Ref Range   Enterococcus species NOT DETECTED NOT DETECTED   Listeria monocytogenes NOT DETECTED NOT DETECTED   Staphylococcus species NOT DETECTED NOT DETECTED   Staphylococcus aureus NOT DETECTED NOT DETECTED   Streptococcus species NOT DETECTED NOT DETECTED   Streptococcus agalactiae NOT DETECTED NOT DETECTED   Streptococcus pneumoniae NOT DETECTED NOT DETECTED   Streptococcus pyogenes NOT DETECTED NOT DETECTED   Acinetobacter baumannii NOT DETECTED NOT DETECTED   Enterobacteriaceae species DETECTED (A) NOT DETECTED   Enterobacter cloacae complex NOT DETECTED NOT DETECTED   Escherichia coli DETECTED (A) NOT DETECTED   Klebsiella oxytoca NOT DETECTED NOT DETECTED   Klebsiella pneumoniae NOT DETECTED NOT DETECTED   Proteus species NOT DETECTED NOT DETECTED   Serratia marcescens NOT DETECTED NOT DETECTED   Carbapenem resistance NOT DETECTED NOT DETECTED   Haemophilus influenzae NOT DETECTED NOT DETECTED   Neisseria meningitidis NOT DETECTED NOT DETECTED   Pseudomonas aeruginosa NOT DETECTED NOT DETECTED   Candida albicans NOT DETECTED NOT DETECTED   Candida glabrata NOT DETECTED NOT DETECTED   Candida krusei NOT DETECTED NOT DETECTED   Candida parapsilosis NOT DETECTED NOT DETECTED   Candida tropicalis NOT DETECTED NOT DETECTED     Changes to prescribed antibiotics required: None, continue Primaxin  Baldemar Friday 07/09/2016  8:36 PM

## 2016-07-09 NOTE — ED Triage Notes (Signed)
Pt brought to ED by [facility] from [facility] for AMS, fever of 101.1 and SOB pt placed on NR mask by EMS with SPO2 of 95%, pt was recently diagnosed with UTI and started on abx. Pt is a DNR pt. AO to self only, NAD noticed.

## 2016-07-09 NOTE — ED Provider Notes (Signed)
Medical screening examination/treatment/procedure(s) were conducted as a shared visit with non-physician practitioner(s) and myself.  I personally evaluated the patient during the encounter.  Patient from living facility with fever and altered mental status. She is unable to give a history due to dementia. Facility reports shortness of breath and febrile to 104 on arrival.  Patient in no distress. Her lungs are clear. He mucous membranes are dry. On exam she has right upper quadrant and epigastric tenderness with guarding.  Workup remarkable for UTI with sepsis. LFTs are normal. Patient will be treated for sepsis of the broad spectrum antibiotics. We'll also obtain right upper quadrant ultrasound. Add on lipase.    EKG Interpretation  Date/Time:  Sunday July 09 2016 05:23:16 EDT Ventricular Rate:  94 PR Interval:    QRS Duration: 118 QT Interval:  356 QTC Calculation: 446 R Axis:   -113 Text Interpretation:  Sinus rhythm Multiform ventricular premature complexes Left anterior fascicular block Anterior infarct, old ST elevation suggests acute pericarditis Confirmed by DELO  MD, DOUGLAS ([id number]) on 07/09/2016 7:14:31 AM         Glynn Octave, MD 07/09/16 [phone]

## 2016-07-09 NOTE — ED Notes (Signed)
Dr. Melynda Ripple at bedside - will upgrade pts bed to tele

## 2016-07-09 NOTE — ED Notes (Signed)
Patient transported to Ultrasound 

## 2016-07-10 ENCOUNTER — Inpatient Hospital Stay (HOSPITAL_COMMUNITY): Payer: Medicare Other

## 2016-07-10 LAB — BASIC METABOLIC PANEL
Anion gap: 9 (ref 5–15)
BUN: 30 mg/dL — ABNORMAL HIGH (ref 6–20)
CALCIUM: 8.7 mg/dL — AB (ref 8.9–10.3)
CHLORIDE: 106 mmol/L (ref 101–111)
CO2: 22 mmol/L (ref 22–32)
CREATININE: 1.37 mg/dL — AB (ref 0.44–1.00)
GFR, EST AFRICAN AMERICAN: 41 mL/min — AB (ref >60)
GFR, EST NON AFRICAN AMERICAN: 35 mL/min — AB (ref >60)
Glucose, Bld: 125 mg/dL — ABNORMAL HIGH (ref 65–99)
Potassium: 3.3 mmol/L — ABNORMAL LOW (ref 3.5–5.1)
SODIUM: 137 mmol/L (ref 135–145)

## 2016-07-10 LAB — CBC
HCT: 34.5 % — ABNORMAL LOW (ref 36.0–46.0)
Hemoglobin: 11.3 g/dL — ABNORMAL LOW (ref 12.0–15.0)
MCH: 29.9 pg (ref 26.0–34.0)
MCHC: 32.8 g/dL (ref 30.0–36.0)
MCV: 91.3 fL (ref 78.0–100.0)
Platelets: 190 10*3/uL (ref 150–400)
RBC: 3.78 MIL/uL — ABNORMAL LOW (ref 3.87–5.11)
RDW: 14.5 % (ref 11.5–15.5)
WBC: 15.3 10*3/uL — ABNORMAL HIGH (ref 4.0–10.5)

## 2016-07-10 LAB — GLUCOSE, CAPILLARY
GLUCOSE-CAPILLARY: 106 mg/dL — AB (ref 65–99)
GLUCOSE-CAPILLARY: 137 mg/dL — AB (ref 65–99)
Glucose-Capillary: 93 mg/dL (ref 65–99)
Glucose-Capillary: 104 mg/dL — ABNORMAL HIGH (ref 65–99)

## 2016-07-10 LAB — HEMOGLOBIN A1C
HEMOGLOBIN A1C: 5.7 % — AB (ref 4.8–5.6)
MEAN PLASMA GLUCOSE: 117 mg/dL

## 2016-07-10 MED ORDER — POTASSIUM CHLORIDE CRYS ER 20 MEQ PO TBCR
40.0000 meq | EXTENDED_RELEASE_TABLET | Freq: Once | ORAL | Status: AC
  Administered 2016-07-10: 40 meq via ORAL
  Filled 2016-07-10: qty 2

## 2016-07-10 NOTE — Progress Notes (Signed)
[facility] Progress Note  Patient: [name] HTM:[id number]   PCP: Pearla Dubonnet, MD DOB: 1933-07-25   DOA: 07/09/2016   DOS: 07/10/2016   Date of Service: the patient was seen and examined on 07/10/2016  Brief hospital course: Pt. with PMH of Chronic CHF, HTN, COPD, AICD implant, hypothyroidism; admitted on 07/09/2016, with complaint of confusion and fever, was found to have Escherichia coli UTI with bacteremia. Currently further plan is continue IV antibiotics.  Assessment and Plan: 1. Sepsis (HCC) Likely from Escherichia coli UTI. Patient presented with right flank pain as well. I will get CT renal stone study to rule out any obstruction. Ultrasound of abdomen unremarkable for any acute abnormality. Blood cultures are positive for gram-negative bacteria although identification is showing Escherichia coli sensitivities are currently pending. Patient is on broad-spectrum antibiotics Primaxin which I will continue. Urine is showing 90,000 Escherichia coli colonies.  2. Essential hypertension. Currently blood pressure medications are on hold in the setting of sepsis.  3. Hypothyroidism. Continue Synthroid.  4. Acute kidney injury. Monitor for volume overload while resuming IV fluids. Renal function better after IV hydration.  5. Acute encephalopathy. Appears to be resolved. Likely at her baseline. We'll continue to closely monitor. Likely due to delirium from UTI.  Pain management: When necessary Tylenol Activity: Consulted physical therapy Bowel regimen: last BM prior to admission Diet: Cardiac diet DVT Prophylaxis: subcutaneous Heparin  Advance goals of care discussion: DNR/DNI  Family Communication: no family was present at bedside, at the time of interview.    Disposition:  Discharge to back to SNF. Expected discharge date: 07/14/2016, resolution of bacteremia  Consultants: None Procedures: None  Antibiotics: Anti-infectives    Start      Dose/Rate Route Frequency Ordered Stop   07/10/16 1000  cefTRIAXone (ROCEPHIN) 1 g in dextrose 5 % 50 mL IVPB  Status:  Discontinued     1 g 100 mL/hr over 30 Minutes Intravenous Every 24 hours 07/09/16 0725 07/09/16 0735   07/09/16 2000  imipenem-cilastatin (PRIMAXIN) 500 mg in sodium chloride 0.9 % 100 mL IVPB     500 mg 200 mL/hr over 30 Minutes Intravenous Every 12 hours 07/09/16 1130     07/09/16 0745  imipenem-cilastatin (PRIMAXIN) 500 mg in sodium chloride 0.9 % 100 mL IVPB     500 mg 200 mL/hr over 30 Minutes Intravenous  Once 07/09/16 0733 07/09/16 0847   07/09/16 0700  cefTRIAXone (ROCEPHIN) 1 g in dextrose 5 % 50 mL IVPB  Status:  Discontinued     1 g 100 mL/hr over 30 Minutes Intravenous  Once 07/09/16 0649 07/09/16 0735        Subjective: Continues to complain about right flank pain. No nausea no vomiting. No diarrhea no constipation. Appears to be at her baseline mentally.  Objective: Physical Exam: Vitals:   07/09/16 2127 07/10/16 0524 07/10/16 1424 07/10/16 1601  BP: (!) 144/65 (!) 153/55 (!) 97/47 (!) 135/54  Pulse: 76 72 (!) 43 71  Resp: 18 18 18    Temp: 98 F (36.7 C) 99.6 F (37.6 C) 97.8 F (36.6 C) 99.2 F (37.3 C)  TempSrc: Oral Oral  Oral  SpO2: 92% 95% 90% 100%  Weight:  57.2 kg (126 lb 3.2 oz)    Height:        Intake/Output Summary (Last 24 hours) at 07/10/16 1931 Last data filed at 07/10/16 1041  Gross per 24 hour  Intake  620 ml  Output                0 ml  Net              620 ml   Filed Weights   07/09/16 0523 07/09/16 1236 07/10/16 0524  Weight: 45.8 kg (101 lb) 56.9 kg (125 lb 8 oz) 57.2 kg (126 lb 3.2 oz)    General: Alert, Awake and Oriented to Time, Place and Person. Appear in mild distress, affect appropriate Eyes: PERRL, Conjunctiva normal ENT: Oral Mucosa clear moist Neck: no JVD, no Abnormal Mass Or lumps Cardiovascular: S1 and S2 Present, aortic systolic Murmur, Respiratory: Bilateral Air entry equal and  Decreased, no use of accessory muscle, Clear to Auscultation, no Crackles, no wheezes Abdomen: Bowel Sound present, Soft and ruq tenderness Skin: no redness, no Rash, no induration Extremities: no Pedal edema, no calf tenderness Neurologic: Grossly no focal neuro deficit. Bilaterally Equal motor strength  Data Reviewed: CBC:  Recent Labs Lab 07/09/16 0530 07/09/16 0800 07/10/16 1133  WBC 11.0* 13.2* 15.3*  NEUTROABS 9.9* 11.2*  --   HGB 11.2* 9.9* 11.3*  HCT 33.9* 29.8* 34.5*  MCV 90.9 91.1 91.3  PLT 146* 139* 190   Basic Metabolic Panel:  Recent Labs Lab 07/09/16 0530 07/09/16 0800 07/10/16 1133  NA 137  --  137  K 3.6  --  3.3*  CL 100*  --  106  CO2 23  --  22  GLUCOSE 172*  --  125*  BUN 60*  --  30*  CREATININE 2.48* 2.25* 1.37*  CALCIUM 8.5*  --  8.7*    Liver Function Tests:  Recent Labs Lab 07/09/16 0530  AST 33  ALT 20  ALKPHOS 79  BILITOT 0.8  PROT 6.1*  ALBUMIN 2.7*    Recent Labs Lab 07/09/16 0530  LIPASE 15   No results for input(s): AMMONIA in the last 168 hours. Coagulation Profile:  Recent Labs Lab 07/09/16 0800  INR 1.25   Cardiac Enzymes: No results for input(s): CKTOTAL, CKMB, CKMBINDEX, TROPONINI in the last 168 hours. BNP (last 3 results) No results for input(s): PROBNP in the last 8760 hours.  CBG:  Recent Labs Lab 07/09/16 1641 07/09/16 2222 07/10/16 0750 07/10/16 1217 07/10/16 1655  GLUCAP 105* 101* 106* 137* 93    Studies: No results found.   Scheduled Meds: . Chlorhexidine Gluconate Cloth  6 each Topical Q0600  . cholecalciferol  2,000 Units Oral Daily  . memantine  28 mg Oral QHS   And  . donepezil  10 mg Oral QHS  . heparin  5,000 Units Subcutaneous Q8H  . imipenem-cilastatin  500 mg Intravenous Q12H  . insulin aspart  0-5 Units Subcutaneous QHS  . insulin aspart  0-9 Units Subcutaneous TID WC  . levothyroxine  112 mcg Oral QAC breakfast  . montelukast  10 mg Oral Daily  . mupirocin ointment  1  application Nasal BID  . nortriptyline  25 mg Oral Daily  . pantoprazole  40 mg Oral Daily   Continuous Infusions:  PRN Meds: acetaminophen, ondansetron **OR** ondansetron (ZOFRAN) IV  Time spent: 30 minutes  Author: Lynden OxfordPranav Jahayra Mazo, MD [facility] Pager: [phone] 07/10/2016 7:31 PM  If 7PM-7AM, please contact night-coverage at [email or web address], password [email or web address]

## 2016-07-10 NOTE — Consult Note (Signed)
            [facility] CM Primary Care Navigator  07/10/2016  [name] Jul 16, 1933 [id number]  Went to see patient at the bedside to identify possible discharge needs but she was fast asleep with no family members at the bedside.  Electronic medical record shows that she is from independent living facility [facility]) and for possible skilled nursing facility discharge.  Will try to follow-up at another time.  For additional questions please contact:  [name], BSN, RN-BC [facility] PRIMARY CARE Navigator Cell: [phone]

## 2016-07-11 ENCOUNTER — Encounter (HOSPITAL_COMMUNITY): Payer: Self-pay | Admitting: Student

## 2016-07-11 ENCOUNTER — Ambulatory Visit (INDEPENDENT_AMBULATORY_CARE_PROVIDER_SITE_OTHER): Payer: Self-pay | Admitting: Orthopaedic Surgery

## 2016-07-11 LAB — URINE CULTURE: Culture: 90000 — AB

## 2016-07-11 LAB — CULTURE, BLOOD (ROUTINE X 2)

## 2016-07-11 LAB — GLUCOSE, CAPILLARY
GLUCOSE-CAPILLARY: 98 mg/dL (ref 65–99)
Glucose-Capillary: 95 mg/dL (ref 65–99)
Glucose-Capillary: 93 mg/dL (ref 65–99)
Glucose-Capillary: 97 mg/dL (ref 65–99)

## 2016-07-11 LAB — CBC
HEMATOCRIT: 38 % (ref 36.0–46.0)
Hemoglobin: 12.8 g/dL (ref 12.0–15.0)
MCH: 30.3 pg (ref 26.0–34.0)
MCHC: 33.7 g/dL (ref 30.0–36.0)
MCV: 89.8 fL (ref 78.0–100.0)
PLATELETS: 223 10*3/uL (ref 150–400)
RBC: 4.23 MIL/uL (ref 3.87–5.11)
RDW: 14.4 % (ref 11.5–15.5)
WBC: 13.7 10*3/uL — AB (ref 4.0–10.5)

## 2016-07-11 LAB — BASIC METABOLIC PANEL
ANION GAP: 10 (ref 5–15)
BUN: 20 mg/dL (ref 6–20)
CALCIUM: 9.1 mg/dL (ref 8.9–10.3)
CO2: 25 mmol/L (ref 22–32)
CREATININE: 1.13 mg/dL — AB (ref 0.44–1.00)
Chloride: 102 mmol/L (ref 101–111)
GFR, EST AFRICAN AMERICAN: 51 mL/min — AB (ref >60)
GFR, EST NON AFRICAN AMERICAN: 44 mL/min — AB (ref >60)
Glucose, Bld: 110 mg/dL — ABNORMAL HIGH (ref 65–99)
Potassium: 3.3 mmol/L — ABNORMAL LOW (ref 3.5–5.1)
SODIUM: 137 mmol/L (ref 135–145)

## 2016-07-11 LAB — MAGNESIUM: MAGNESIUM: 1.9 mg/dL (ref 1.7–2.4)

## 2016-07-11 MED ORDER — ACETAMINOPHEN-CODEINE #3 300-30 MG PO TABS: 1.0000 | ORAL_TABLET | Freq: Three times a day (TID) | ORAL | Status: DC | PRN

## 2016-07-11 MED ORDER — ORAL CARE MOUTH RINSE
15.0000 mL | Freq: Two times a day (BID) | OROMUCOSAL | Status: DC
  Administered 2016-07-11 – 2016-07-13 (×5): 15 mL via OROMUCOSAL

## 2016-07-11 MED ORDER — LEVOFLOXACIN IN D5W 500 MG/100ML IV SOLN
500.0000 mg | INTRAVENOUS | Status: DC
  Administered 2016-07-11 – 2016-07-12 (×2): 500 mg via INTRAVENOUS
  Filled 2016-07-11 (×2): qty 100

## 2016-07-11 MED ORDER — POTASSIUM CHLORIDE CRYS ER 20 MEQ PO TBCR
40.0000 meq | EXTENDED_RELEASE_TABLET | ORAL | Status: AC
Start: 2016-07-11 — End: 2016-07-11
  Administered 2016-07-11 (×2): 40 meq via ORAL
  Filled 2016-07-11 (×2): qty 2

## 2016-07-11 MED ORDER — SODIUM CHLORIDE 0.9 % IV SOLN
500.0000 mg | Freq: Three times a day (TID) | INTRAVENOUS | Status: DC
  Filled 2016-07-11 (×2): qty 500

## 2016-07-11 NOTE — NC FL2 (Signed)
[place] MEDICAID FL2 LEVEL OF CARE SCREENING TOOL     IDENTIFICATION  Patient Name: [name] Birthdate: 23-Jul-1933 Sex: female Admission Date (Current Location): 07/09/2016  [facility] and [place] Number:  Producer, television/film/video and Address:  [facility] [place], [address] [place], [place] [address]      Provider Number: [id number]  Attending Physician Name and Address:  Rolly Salter, MD  Relative Name and Phone Number:  Sherrilee Gilles, [phone]    Current Level of Care: Hospital Recommended Level of Care: Skilled Nursing Facility Prior Approval Number:    Date Approved/Denied:   PASRR Number: [id number]  Discharge Plan: SNF    Current Diagnoses: Patient Active Problem List   Diagnosis Date Noted  . Sepsis (HCC) 07/09/2016  . UTI (urinary tract infection) 07/09/2016  . Acute encephalopathy 07/09/2016  . Acute renal failure superimposed on stage 3 chronic kidney disease (HCC) 07/09/2016  . Hyperglycemia 07/09/2016  . Acute cystitis with hematuria   . AKI (acute kidney injury) (HCC)   . Nausea and vomiting 09/25/2015  . Mild dementia   . Hypertensive urgency 09/24/2015  . Eye swollen, left 11/12/2014  . Rash, right thigh 11/12/2014  . Femur fracture, left (HCC) 09/29/2014  . Hypothyroidism 09/29/2014  . Coronary atherosclerosis of native coronary artery 06/03/2014  . S/P mitral valve repair 06/03/2014  . Total knee replacement status 04/29/2014  . Dizziness 08/28/2013  . FH: mitral valve repair   . COPD (chronic obstructive pulmonary disease) (HCC)   . BBB (bundle branch block)   . Ischemic cardiomyopathy   . Automatic implantable cardioverter-defibrillator in situ 06/24/2012  . Preop respiratory exam 05/17/2012  . Chronic systolic heart failure (HCC) 03/01/2011  . Other specified forms of chronic ischemic heart disease 03/01/2011  . S/P CABG (coronary artery bypass graft) 12/25/2010  . SHORTNESS OF BREATH (SOB) 05/07/2009  .  Hyperlipidemia 05/06/2009  . Essential hypertension 05/06/2009    Orientation RESPIRATION BLADDER Height & Weight     Self, Place  O2 (Nasal cannula 2L) Incontinent Weight: 55.5 kg (122 lb 4.8 oz) Height:  5\' 2"  (157.5 cm)  BEHAVIORAL SYMPTOMS/MOOD NEUROLOGICAL BOWEL NUTRITION STATUS   (N/A)   Incontinent Diet (Please see DC Summary)  AMBULATORY STATUS COMMUNICATION OF NEEDS Skin   Limited Assist Verbally Normal                       Personal Care Assistance Level of Assistance  Bathing, Feeding, Dressing Bathing Assistance: Limited assistance Feeding assistance: Independent Dressing Assistance: Limited assistance     Functional Limitations Info  Sight, Hearing, Speech Sight Info: Adequate Hearing Info: Adequate Speech Info: Adequate    SPECIAL CARE FACTORS FREQUENCY  PT (By licensed PT)     PT Frequency: not yet assessed              Contractures      Additional Factors Info  Code Status, Allergies, Isolation Precautions, Insulin Sliding Scale Code Status Info: DNR Allergies Info: Alprazolam, Doxycycline, Fulvicin P-g Griseofulvin, Hydrocodone, Ketoconazole, Serevent Salmeterol, Calcitonin ([place]), Morphine And Related, Amitriptyline, Cefuroxime Axetil, Co Q 10 Coenzyme Q10, Hydrocodone-acetaminophen, Lorcet Hydrocodone-acetaminophen, Other, Percodan Oxycodone-aspirin, Plendil Felodipine, Sulfamethoxazole-trimethoprim, Sulfonamide Derivatives, Trovan Alatrofloxacin, Ventolin Albuterol, Aspirin, Cephalexin, Ciprofloxacin, Clonazepam, Erythromycin, Hydromorphone, Miacalcin Calcitonin ([place]), Oxycodone-aspirin, Penicillins, Tapazole Methimazole   Insulin Sliding Scale Info: insulin aspart (novoLOG) injection 0-5 Units;insulin aspart (novoLOG) injection 0-9 Units Isolation Precautions Info: MRSA     Current Medications (07/11/2016):  This is  the current hospital active medication list Current Facility-Administered Medications  Medication Dose Route Frequency  Provider Last Rate Last Dose  . acetaminophen (TYLENOL) tablet 650 mg  650 mg Oral Q6H PRN Gwenyth BenderKaren M Black, NP   650 mg at 07/11/16 0014  . acetaminophen-codeine (TYLENOL #3) 300-30 MG per tablet 1 tablet  1 tablet Oral Q8H PRN Rolly SalterPranav M Patel, MD      . Chlorhexidine Gluconate Cloth 2 % PADS 6 each  6 each Topical Q0600 Haydee SalterPhillip M Hobbs, MD   6 each at 07/10/16 0600  . cholecalciferol (VITAMIN D) tablet 2,000 Units  2,000 Units Oral Daily Gwenyth BenderKaren M Black, NP   2,000 Units at 07/11/16 1140  . memantine (NAMENDA XR) 24 hr capsule 28 mg  28 mg Oral QHS Haydee SalterPhillip M Hobbs, MD   28 mg at 07/10/16 2204   And  . donepezil (ARICEPT) tablet 10 mg  10 mg Oral QHS Haydee SalterPhillip M Hobbs, MD   10 mg at 07/10/16 2204  . heparin injection 5,000 Units  5,000 Units Subcutaneous Q8H Gwenyth BenderKaren M Black, NP   5,000 Units at 07/11/16 1315  . insulin aspart (novoLOG) injection 0-5 Units  0-5 Units Subcutaneous QHS Lesle ChrisKaren M Black, NP      . insulin aspart (novoLOG) injection 0-9 Units  0-9 Units Subcutaneous TID WC Gwenyth BenderKaren M Black, NP   1 Units at 07/10/16 1257  . levofloxacin (LEVAQUIN) IVPB 500 mg  500 mg Intravenous Q24H Rolly SalterPranav M Patel, MD   500 mg at 07/11/16 1153  . levothyroxine (SYNTHROID, LEVOTHROID) tablet 112 mcg  112 mcg Oral QAC breakfast Gwenyth BenderKaren M Black, NP   112 mcg at 07/11/16 1139  . MEDLINE mouth rinse  15 mL Mouth Rinse BID Rolly SalterPranav M Patel, MD   15 mL at 07/11/16 1141  . montelukast (SINGULAIR) tablet 10 mg  10 mg Oral Daily Gwenyth BenderKaren M Black, NP   10 mg at 07/11/16 1140  . mupirocin ointment (BACTROBAN) 2 % 1 application  1 application Nasal BID Haydee SalterPhillip M Hobbs, MD   1 application at 07/11/16 1139  . nortriptyline (PAMELOR) capsule 25 mg  25 mg Oral Daily Lesle ChrisKaren M Black, NP   25 mg at 07/11/16 1140  . ondansetron (ZOFRAN) tablet 4 mg  4 mg Oral Q6H PRN Gwenyth BenderKaren M Black, NP       Or  . ondansetron [facility](ZOFRAN) injection 4 mg  4 mg Intravenous Q6H PRN Gwenyth BenderKaren M Black, NP      . pantoprazole (PROTONIX) EC tablet 40 mg  40 mg Oral Daily Gwenyth BenderKaren  M Black, NP   40 mg at 07/11/16 1140     Discharge Medications: Please see discharge summary for a list of discharge medications.  Relevant Imaging Results:  Relevant Lab Results:   Additional Information SSN: [id number]  Peja Allender S HowardRayyan, [place]CSWA

## 2016-07-11 NOTE — Progress Notes (Addendum)
[facility] Progress Note  Patient: [name] ION:[id number]   PCP: Pearla DubonnetGATES,ROBERT NEVILL, MD DOB: 21-May-1933   DOA: 07/09/2016   DOS: 07/11/2016   Date of Service: the patient was seen and examined on 07/11/2016  Brief hospital course: Pt. with PMH of Chronic CHF, HTN, COPD, AICD implant, hypothyroidism; admitted on 07/09/2016, with complaint of confusion and fever, was found to have Escherichia coli UTI with bacteremia. Currently further plan is continue IV antibiotics.  Assessment and Plan: 1. Sepsis (HCC) Escherichia coli UTI. With bacteremia  Right pyelonephritis  Patient presented with right flank pain as well. CT renal stone study rule out any obstruction. But suggest right pyelonephritis with mild hydro, may have passed stone.  Ultrasound of abdomen unremarkable for any acute abnormality. Blood cultures are positive for Escherichia coli sensitive to levaquin, will transition to that and continue IV. Urine is showing 90,000 Escherichia coli colonies.  2. Essential hypertension. Currently blood pressure medications are on hold in the setting of sepsis.  3. Hypothyroidism. Continue Synthroid.  4. Acute kidney injury. Resolved, monitor.  5. Acute encephalopathy. Appears to be resolved. Likely at her baseline. We'll continue to closely monitor. Likely due to delirium from UTI.  Pain management: When necessary Tylenol #3 Activity: Consulted physical therapy Bowel regimen: last BM 10/17 Diet: Cardiac diet DVT Prophylaxis: subcutaneous Heparin  Advance goals of care discussion: DNR/DNI  Family Communication: no family was present at bedside, at the time of interview.    Disposition:  Discharge to back to SNF. Expected discharge date: 07/14/2016, resolution of bacteremia  Consultants: None Procedures: None  Antibiotics: Anti-infectives    Start     Dose/Rate Route Frequency Ordered Stop   07/11/16 1015  levofloxacin (LEVAQUIN) IVPB 500 mg     500 mg 100  mL/hr over 60 Minutes Intravenous Every 24 hours 07/11/16 1008     07/11/16 1000  imipenem-cilastatin (PRIMAXIN) 500 mg in sodium chloride 0.9 % 100 mL IVPB  Status:  Discontinued     500 mg 200 mL/hr over 30 Minutes Intravenous Every 8 hours 07/11/16 0900 07/11/16 1005   07/10/16 1000  cefTRIAXone (ROCEPHIN) 1 g in dextrose 5 % 50 mL IVPB  Status:  Discontinued     1 g 100 mL/hr over 30 Minutes Intravenous Every 24 hours 07/09/16 0725 07/09/16 0735   07/09/16 2000  imipenem-cilastatin (PRIMAXIN) 500 mg in sodium chloride 0.9 % 100 mL IVPB  Status:  Discontinued     500 mg 200 mL/hr over 30 Minutes Intravenous Every 12 hours 07/09/16 1130 07/11/16 0900   07/09/16 0745  imipenem-cilastatin (PRIMAXIN) 500 mg in sodium chloride 0.9 % 100 mL IVPB     500 mg 200 mL/hr over 30 Minutes Intravenous  Once 07/09/16 0733 07/09/16 0847   07/09/16 0700  cefTRIAXone (ROCEPHIN) 1 g in dextrose 5 % 50 mL IVPB  Status:  Discontinued     1 g 100 mL/hr over 30 Minutes Intravenous  Once 07/09/16 0649 07/09/16 0735     Subjective: Continues to complain about right flank pain. No nausea no vomiting.  Objective: Physical Exam: Vitals:   07/10/16 2317 07/11/16 0700 07/11/16 0717 07/11/16 1453  BP: (!) 158/70  (!) 146/74 (!) 142/68  Pulse: 85  (!) 112 100  Resp: 19  18 17   Temp: 100 F (37.8 C)  97.3 F (36.3 C) 97.9 F (36.6 C)  TempSrc: Oral   Oral  SpO2: 97%   98%  Weight:  55.5 kg (122 lb 4.8 oz)  Height:        Intake/Output Summary (Last 24 hours) at 07/11/16 1945 Last data filed at 07/11/16 1509  Gross per 24 hour  Intake             1060 ml  Output                0 ml  Net             1060 ml   Filed Weights   07/09/16 1236 07/10/16 0524 07/11/16 0700  Weight: 56.9 kg (125 lb 8 oz) 57.2 kg (126 lb 3.2 oz) 55.5 kg (122 lb 4.8 oz)    General: Alert, Awake and Oriented to Time, Place and Person. Appear in mild distress, affect appropriate Eyes: PERRL, Conjunctiva normal ENT: Oral  Mucosa clear moist Neck: no JVD, no Abnormal Mass Or lumps Cardiovascular: S1 and S2 Present, aortic systolic Murmur, Respiratory: Bilateral Air entry equal and Decreased, no use of accessory muscle, Clear to Auscultation, no Crackles, no wheezes Abdomen: Bowel Sound present, Soft and ruq tenderness Skin: no redness, no Rash, no induration Extremities: no Pedal edema, no calf tenderness Neurologic: Grossly no focal neuro deficit. Bilaterally Equal motor strength  Data Reviewed: CBC:  Recent Labs Lab 07/09/16 0530 07/09/16 0800 07/10/16 1133 07/11/16 0657  WBC 11.0* 13.2* 15.3* 13.7*  NEUTROABS 9.9* 11.2*  --   --   HGB 11.2* 9.9* 11.3* 12.8  HCT 33.9* 29.8* 34.5* 38.0  MCV 90.9 91.1 91.3 89.8  PLT 146* 139* 190 223   Basic Metabolic Panel:  Recent Labs Lab 07/09/16 0530 07/09/16 0800 07/10/16 1133 07/11/16 0657  NA 137  --  137 137  K 3.6  --  3.3* 3.3*  CL 100*  --  106 102  CO2 23  --  22 25  GLUCOSE 172*  --  125* 110*  BUN 60*  --  30* 20  CREATININE 2.48* 2.25* 1.37* 1.13*  CALCIUM 8.5*  --  8.7* 9.1  MG  --   --   --  1.9    Liver Function Tests:  Recent Labs Lab 07/09/16 0530  AST 33  ALT 20  ALKPHOS 79  BILITOT 0.8  PROT 6.1*  ALBUMIN 2.7*    Recent Labs Lab 07/09/16 0530  LIPASE 15   No results for input(s): AMMONIA in the last 168 hours. Coagulation Profile:  Recent Labs Lab 07/09/16 0800  INR 1.25   Cardiac Enzymes: No results for input(s): CKTOTAL, CKMB, CKMBINDEX, TROPONINI in the last 168 hours. BNP (last 3 results) No results for input(s): PROBNP in the last 8760 hours.  CBG:  Recent Labs Lab 07/10/16 1655 07/10/16 2318 07/11/16 0828 07/11/16 1224 07/11/16 1704  GLUCAP 93 104* 98 95 93    Studies: Ct Renal Stone Study  Result Date: 07/11/2016 CLINICAL DATA:  82 year old female with right flank pain and urinary tract infection. EXAM: CT ABDOMEN AND PELVIS WITHOUT CONTRAST TECHNIQUE: Multidetector CT imaging of  the abdomen and pelvis was performed following the standard protocol without IV contrast. COMPARISON:  CT dated 09/04/2014 FINDINGS: Evaluation of this exam is limited in the absence of intravenous contrast. Evaluation is also limited due to respiratory motion artifact and streak artifact caused by metallic density adjacent to the patient. Lower chest: Partially visualized small bilateral pleural effusions. There is associated partial compressive atelectasis of the lung bases. Pneumonia is not entirely excluded. Clinical correlation is recommended. There is diffuse interstitial and interlobular septal prominence, likely representing interstitial edema. A  small right lung base calcified granuloma noted. Partially visualized cardiac pacemaker wires and CABG vascular clips. No intra-abdominal free air.  Small free fluid within the pelvis. Hepatobiliary: The liver appears unremarkable. No intrahepatic biliary ductal dilatation. The gallbladder is mildly distended. No stone or pericholecystic fluid. Pancreas: Unremarkable. No pancreatic ductal dilatation or surrounding inflammatory changes. Spleen: Normal in size without focal abnormality. Adrenals/Urinary Tract: The adrenal glands appear unremarkable. There is mild right hydronephrosis and mild right perinephric stranding. No definite renal calculi identified. No stones noted along the course of the right ureter, however the ureter is not well visualized in its entire course. Findings may be related to a recently passed right renal calculus versus right-sided pyelonephritis. Correlation with urinalysis recommended. There is mild left perinephric stranding. There is no hydronephrosis on the left. The left ureter and urinary bladder appear unremarkable. Stomach/Bowel: Moderate stool throughout the colon. There is no evidence of bowel obstruction or active inflammation. The appendix is not visualized with certainty. No inflammatory changes identified in the right lower  quadrant. Vascular/Lymphatic: There is advanced aortoiliac atherosclerotic disease. Evaluation of the vasculature is limited on this noncontrast study. No portal venous gas identified. There is no adenopathy. Reproductive: Hysterectomy. Other: None Musculoskeletal: Scoliosis with multilevel degenerative changes of the spine with osteophyte formation. Left femoral intra medullary rod and transcervical fixation screw. No acute fracture. IMPRESSION: Mild right hydronephrosis which may be related to a recently passed right renal stone versus pyelonephritis. Correlation with urinalysis recommended. No definite renal calculi identified. Congestive changes versus mild interstitial edema. Partially visualized small bilateral pleural effusions. Electronically Signed   By: Elgie Collard M.D.   On: 07/11/2016 05:02     Scheduled Meds: . Chlorhexidine Gluconate Cloth  6 each Topical Q0600  . cholecalciferol  2,000 Units Oral Daily  . memantine  28 mg Oral QHS   And  . donepezil  10 mg Oral QHS  . heparin  5,000 Units Subcutaneous Q8H  . insulin aspart  0-5 Units Subcutaneous QHS  . insulin aspart  0-9 Units Subcutaneous TID WC  . levofloxacin (LEVAQUIN) IV  500 mg Intravenous Q24H  . levothyroxine  112 mcg Oral QAC breakfast  . mouth rinse  15 mL Mouth Rinse BID  . montelukast  10 mg Oral Daily  . mupirocin ointment  1 application Nasal BID  . nortriptyline  25 mg Oral Daily  . pantoprazole  40 mg Oral Daily   Continuous Infusions:  PRN Meds: acetaminophen, acetaminophen-codeine, ondansetron **OR** ondansetron (ZOFRAN) IV  Time spent: 30 minutes  Author: Lynden Oxford, MD [facility] Pager: [phone] 07/11/2016 7:45 PM  If 7PM-7AM, please contact night-coverage at [email or web address], password [facility]

## 2016-07-11 NOTE — Consult Note (Signed)
            [facility] CM Primary Care Navigator  07/11/2016  [name] 01/21/1933 [id number]  Went to see patient at the bedside for follow-up and identify discharge needs. Noted patient to be disoriented to time and place but oriented to self (mild dementia). No family member present at this time. Patient confirmed Dr. Marden Noble with [facility] at [place] as the primary care provider.   She remembered being at the [facility] and stated that her "husband brought her in for one test" but now she "does not know where husband went". She also asked if this "place will close tonight".   Attempted to contact spouse Wonderly) at [phone] - [mobile number] but continuously giving a busy signal and  [phone] [home] but number is disconnected.  Per MD notes, patient is to discharge back to SNF upon resolution of bacteremia. , For additional questions please contact:  Karin Golden A. Davin Archuletta, BSN, RN-BC [facility] PRIMARY CARE Navigator Cell: [phone]

## 2016-07-11 NOTE — Progress Notes (Signed)
Per CCMD pt had an episode of V tach, Kirby the physician on call has been paged about it, no new orders yet, will continue to monitor

## 2016-07-12 DIAGNOSIS — N3001 Acute cystitis with hematuria: Secondary | ICD-10-CM

## 2016-07-12 DIAGNOSIS — A4151 Sepsis due to Escherichia coli [E. coli]: Principal | ICD-10-CM

## 2016-07-12 LAB — CBC WITH DIFFERENTIAL/PLATELET
BASOS PCT: 1 %
Basophils Absolute: 0.1 10*3/uL (ref 0.0–0.1)
EOS PCT: 3 %
Eosinophils Absolute: 0.3 10*3/uL (ref 0.0–0.7)
HEMATOCRIT: 36.2 % (ref 36.0–46.0)
HEMOGLOBIN: 12.3 g/dL (ref 12.0–15.0)
Lymphocytes Relative: 16 %
Lymphs Abs: 1.7 10*3/uL (ref 0.7–4.0)
MCH: 30.3 pg (ref 26.0–34.0)
MCHC: 34 g/dL (ref 30.0–36.0)
MCV: 89.2 fL (ref 78.0–100.0)
MONOS PCT: 9 %
Monocytes Absolute: 1 10*3/uL (ref 0.1–1.0)
NEUTROS PCT: 71 %
Neutro Abs: 7.5 10*3/uL (ref 1.7–7.7)
Platelets: 221 10*3/uL (ref 150–400)
RBC: 4.06 MIL/uL (ref 3.87–5.11)
RDW: 14.2 % (ref 11.5–15.5)
WBC: 10.6 10*3/uL — AB (ref 4.0–10.5)

## 2016-07-12 LAB — BASIC METABOLIC PANEL
ANION GAP: 12 (ref 5–15)
BUN: 18 mg/dL (ref 6–20)
CHLORIDE: 107 mmol/L (ref 101–111)
CO2: 17 mmol/L — AB (ref 22–32)
Calcium: 9 mg/dL (ref 8.9–10.3)
Creatinine, Ser: 1.08 mg/dL — ABNORMAL HIGH (ref 0.44–1.00)
GFR calc Af Amer: 54 mL/min — ABNORMAL LOW (ref >60)
GFR, EST NON AFRICAN AMERICAN: 47 mL/min — AB (ref >60)
GLUCOSE: 97 mg/dL (ref 65–99)
POTASSIUM: 4.7 mmol/L (ref 3.5–5.1)
Sodium: 136 mmol/L (ref 135–145)

## 2016-07-12 LAB — GLUCOSE, CAPILLARY
GLUCOSE-CAPILLARY: 93 mg/dL (ref 65–99)
GLUCOSE-CAPILLARY: 109 mg/dL — AB (ref 65–99)
Glucose-Capillary: 93 mg/dL (ref 65–99)
Glucose-Capillary: 93 mg/dL (ref 65–99)

## 2016-07-12 MED ORDER — LEVOFLOXACIN 500 MG PO TABS
500.0000 mg | ORAL_TABLET | Freq: Every day | ORAL | Status: DC
  Administered 2016-07-13: 500 mg via ORAL
  Filled 2016-07-12: qty 1

## 2016-07-12 MED ORDER — CARVEDILOL 12.5 MG PO TABS
12.5000 mg | ORAL_TABLET | Freq: Two times a day (BID) | ORAL | Status: DC
  Administered 2016-07-12 – 2016-07-13 (×2): 12.5 mg via ORAL
  Filled 2016-07-12 (×2): qty 1

## 2016-07-12 NOTE — Progress Notes (Signed)
[facility] Progress Note  Patient: [name] NGI:[id number]   PCP: Pearla Dubonnet, MD DOB: 07/23/1933   DOA: 07/09/2016   DOS: 07/12/2016   Date of Service: the patient was seen and examined on 07/12/2016  Brief hospital course: Pt. with PMH of Chronic CHF, HTN, COPD, AICD implant, hypothyroidism; admitted on 07/09/2016, with complaint of confusion and fever, was found to have Escherichia coli UTI with bacteremia. Currently further plan is continue IV antibiotics.  Assessment and Plan: 1. Sepsis (HCC) Escherichia coli UTI with bacteremia  -Right pyelonephritis  -CT renal stone study ruled out obstruction. But suggested right pyelonephritis with mild hydro, may have passed stone.  -Ultrasound of abdomen unremarkable for any acute abnormality. -Urine and Blood cultures are positive for Escherichia coli sensitive to levaquin, repeat blood cx negative -change to PO levaquin today  2. Essential hypertension. -BP stable, resume Coreg  3. Hypothyroidism. Continue Synthroid.  4. Acute kidney injury. -due to Sepsis, ARb -Resolved  5. Acute metabolic encephalopathy. -due to Infection, improved  DVT Prophylaxis: subcutaneous Heparin  Advance goals of care discussion: DNR/DNI  Family Communication: no family was present at bedside, at the time of interview.    Disposition: Discharge to back to SNF tomorrow if stable  Consultants: None Procedures: None  Antibiotics: Anti-infectives    Start     Dose/Rate Route Frequency Ordered Stop   07/13/16 1000  levofloxacin (LEVAQUIN) tablet 500 mg     500 mg Oral Daily 07/12/16 1320     07/11/16 1015  levofloxacin (LEVAQUIN) IVPB 500 mg  Status:  Discontinued     500 mg 100 mL/hr over 60 Minutes Intravenous Every 24 hours 07/11/16 1008 07/12/16 1319   07/11/16 1000  imipenem-cilastatin (PRIMAXIN) 500 mg in sodium chloride 0.9 % 100 mL IVPB  Status:  Discontinued     500 mg 200 mL/hr over 30 Minutes Intravenous Every 8  hours 07/11/16 0900 07/11/16 1005   07/10/16 1000  cefTRIAXone (ROCEPHIN) 1 g in dextrose 5 % 50 mL IVPB  Status:  Discontinued     1 g 100 mL/hr over 30 Minutes Intravenous Every 24 hours 07/09/16 0725 07/09/16 0735   07/09/16 2000  imipenem-cilastatin (PRIMAXIN) 500 mg in sodium chloride 0.9 % 100 mL IVPB  Status:  Discontinued     500 mg 200 mL/hr over 30 Minutes Intravenous Every 12 hours 07/09/16 1130 07/11/16 0900   07/09/16 0745  imipenem-cilastatin (PRIMAXIN) 500 mg in sodium chloride 0.9 % 100 mL IVPB     500 mg 200 mL/hr over 30 Minutes Intravenous  Once 07/09/16 0733 07/09/16 0847   07/09/16 0700  cefTRIAXone (ROCEPHIN) 1 g in dextrose 5 % 50 mL IVPB  Status:  Discontinued     1 g 100 mL/hr over 30 Minutes Intravenous  Once 07/09/16 0649 07/09/16 0735     Subjective: feels ok, some weakness-generalized  Objective: Physical Exam: Vitals:   07/12/16 0550 07/12/16 0744 07/12/16 1325 07/12/16 1338  BP: (!) 130/116 140/76  132/62  Pulse: 78 (!) 112  80  Resp: 18 18  19   Temp:  97.7 F (36.5 C)  97.5 F (36.4 C)  TempSrc: Oral Oral  Oral  SpO2: 93% 100% 100% 100%  Weight:      Height:        Intake/Output Summary (Last 24 hours) at 07/12/16 1457 Last data filed at 07/12/16 1100  Gross per 24 hour  Intake              360  ml  Output                0 ml  Net              360 ml   Filed Weights   07/10/16 0524 07/11/16 0700 07/12/16 0528  Weight: 57.2 kg (126 lb 3.2 oz) 55.5 kg (122 lb 4.8 oz) 53.5 kg (117 lb 14.4 oz)    General: AAOx2, no distress ENT: Oral Mucosa clear moist Neck: no JVD, no Abnormal Mass Or lumps Cardiovascular: S1 and S2 Present, aortic systolic Murmur, Respiratory: Bilateral Air entry equal and Decreased BS at bases Abdomen: Bowel Sound present, Soft non tender Skin: no redness, no Rash, no induration Extremities: no Pedal edema, no calf tenderness Neurologic: Grossly no focal neuro deficit. Bilaterally Equal motor strength  Data  Reviewed: CBC:  Recent Labs Lab 07/09/16 0530 07/09/16 0800 07/10/16 1133 07/11/16 0657 07/12/16 0657  WBC 11.0* 13.2* 15.3* 13.7* 10.6*  NEUTROABS 9.9* 11.2*  --   --  7.5  HGB 11.2* 9.9* 11.3* 12.8 12.3  HCT 33.9* 29.8* 34.5* 38.0 36.2  MCV 90.9 91.1 91.3 89.8 89.2  PLT 146* 139* 190 223 221   Basic Metabolic Panel:  Recent Labs Lab 07/09/16 0530 07/09/16 0800 07/10/16 1133 07/11/16 0657 07/12/16 0657  NA 137  --  137 137 136  K 3.6  --  3.3* 3.3* 4.7  CL 100*  --  106 102 107  CO2 23  --  22 25 17*  GLUCOSE 172*  --  125* 110* 97  BUN 60*  --  30* 20 18  CREATININE 2.48* 2.25* 1.37* 1.13* 1.08*  CALCIUM 8.5*  --  8.7* 9.1 9.0  MG  --   --   --  1.9  --     Liver Function Tests:  Recent Labs Lab 07/09/16 0530  AST 33  ALT 20  ALKPHOS 79  BILITOT 0.8  PROT 6.1*  ALBUMIN 2.7*    Recent Labs Lab 07/09/16 0530  LIPASE 15   No results for input(s): AMMONIA in the last 168 hours. Coagulation Profile:  Recent Labs Lab 07/09/16 0800  INR 1.25   Cardiac Enzymes: No results for input(s): CKTOTAL, CKMB, CKMBINDEX, TROPONINI in the last 168 hours. BNP (last 3 results) No results for input(s): PROBNP in the last 8760 hours.  CBG:  Recent Labs Lab 07/11/16 1224 07/11/16 1704 07/11/16 2150 07/12/16 0744 07/12/16 1139  GLUCAP 95 93 97 93 93    Studies: No results found.   Scheduled Meds: . Chlorhexidine Gluconate Cloth  6 each Topical Q0600  . cholecalciferol  2,000 Units Oral Daily  . memantine  28 mg Oral QHS   And  . donepezil  10 mg Oral QHS  . heparin  5,000 Units Subcutaneous Q8H  . insulin aspart  0-5 Units Subcutaneous QHS  . insulin aspart  0-9 Units Subcutaneous TID WC  . [START ON 07/13/2016] levofloxacin  500 mg Oral Daily  . levothyroxine  112 mcg Oral QAC breakfast  . mouth rinse  15 mL Mouth Rinse BID  . montelukast  10 mg Oral Daily  . mupirocin ointment  1 application Nasal BID  . nortriptyline  25 mg Oral Daily  .  pantoprazole  40 mg Oral Daily   Continuous Infusions:  PRN Meds: acetaminophen, acetaminophen-codeine, ondansetron **OR** ondansetron (ZOFRAN) IV  Time spent: 30 minutes  Zannie CovePreetha Dontarius Sheley, MD [facility] Pager: [phone] 07/12/2016 2:57 PM  If 7PM-7AM, please contact night-coverage at  [email or web address], password [facility]

## 2016-07-12 NOTE — Evaluation (Signed)
Physical Therapy Evaluation Patient Details Name: [name] MRN: [id number] DOB: 1933-02-21 Today's Date: 07/12/2016   History of Present Illness  Pt. with PMH of Chronic CHF, HTN, COPD, AICD implant, hypothyroidism; admitted on 07/09/2016, with complaint of confusion and fever, was found to have Escherichia coli UTI with bacteremia.  Clinical Impression  Pt presents with general fatigue and deconditioning secondary to Sepsis. Pt would benefit from skilled PT to maximize mobility and independence for the next venue of care. Pt is currently requiring multiple rest breaks for minimal activity. Pt is currently able to ambulate 3-5 feet and then requires a rest break. O2 sats 100% on 2 liters. Pt reported she lives at [facility] independent living apartments. Pt will need assistance with mobility and will need increased level of care if returning to apartment. Recommend SNF.     Follow Up Recommendations Supervision/Assistance - 24 hour;SNF    Equipment Recommendations  None recommended by PT    Recommendations for Other Services OT consult     Precautions / Restrictions Precautions Precautions: Fall;Other (comment) (dyspnea) Restrictions Weight Bearing Restrictions: No      Mobility  Bed Mobility Overal bed mobility:  (in chair)                Transfers Overall transfer level: Needs assistance Equipment used: Rolling walker (2 wheeled) Transfers: Sit to/from Stand Sit to Stand: Min guard         General transfer comment: cues for safety and hand placement  Ambulation/Gait Ambulation/Gait assistance: Min assist Ambulation Distance (Feet): 6 Feet Assistive device: Rolling walker (2 wheeled) Gait Pattern/deviations: Step-to pattern;Decreased stride length Gait velocity: decreased Gait velocity interpretation: Below normal speed for age/gender General Gait Details: Pt attempted to furniture walk. Pt reported she does not use a rw or assistive device. Pt was  limited by dyspnea and general fatigue. Pt took multiple standing rest breaks and required mod assist to avoid obsticles while using a RW. Pt ambulated 54ft, 87ft, 24ft, with min assist.  Stairs            Wheelchair Mobility    Modified Rankin (Stroke Patients Only)       Balance Overall balance assessment: Needs assistance Sitting-balance support: Bilateral upper extremity supported Sitting balance-Leahy Scale: Fair     Standing balance support: Bilateral upper extremity supported Standing balance-Leahy Scale: Poor                               Pertinent Vitals/Pain Pain Assessment: No/denies pain    Home Living Family/patient expects to be discharged to:: Skilled nursing facility                      Prior Function Level of Independence: Independent         Comments: Pt lives at [facility] independent [facility]. They provide the meals.     Hand Dominance        Extremity/Trunk Assessment   Upper Extremity Assessment: Defer to OT evaluation           Lower Extremity Assessment: Overall WFL for tasks assessed         Communication   Communication: No difficulties  Cognition Arousal/Alertness: Awake/alert Behavior During Therapy: WFL for tasks assessed/performed Overall Cognitive Status: Within Functional Limits for tasks assessed                      General Comments  Exercises General Exercises - Lower Extremity Ankle Circles/Pumps: AROM;Strengthening;Both;Seated Long Arc Quad: AROM;Strengthening;Both;10 reps;Seated Hip Flexion/Marching: AROM;Strengthening;Both;10 reps;Seated   Assessment/Plan    PT Assessment Patient needs continued PT services  PT Problem List Decreased activity tolerance;Decreased balance;Decreased mobility;Decreased safety awareness;Cardiopulmonary status limiting activity;Decreased knowledge of use of DME          PT Treatment Interventions DME instruction;Gait training;Therapeutic  activities;Therapeutic exercise;Balance training;Patient/family education    PT Goals (Current goals can be found in the Care Plan section)  Acute Rehab PT Goals Patient Stated Goal: to go back home PT Goal Formulation: With patient Time For Goal Achievement: 07/26/16 Potential to Achieve Goals: Good    Frequency Min 3X/week   Barriers to discharge        Co-evaluation               End of Session Equipment Utilized During Treatment: Gait belt;Oxygen Activity Tolerance: Patient limited by fatigue Patient left: in chair;with call bell/phone within reach Nurse Communication: Mobility status         Time: [id number] PT Time Calculation (min) (ACUTE ONLY): 38 min   Charges:   PT Evaluation $PT Eval Moderate Complexity: 1 Procedure PT Treatments $Gait Training: 8-22 mins $Therapeutic Exercise: 8-22 mins   PT G Codes:        Greggory StallionWrisley, Carigan Lister Kerstine 07/12/2016, 1:35 PM

## 2016-07-13 DIAGNOSIS — R2689 Other abnormalities of gait and mobility: Secondary | ICD-10-CM | POA: Diagnosis not present

## 2016-07-13 DIAGNOSIS — R319 Hematuria, unspecified: Secondary | ICD-10-CM | POA: Diagnosis not present

## 2016-07-13 DIAGNOSIS — F0390 Unspecified dementia without behavioral disturbance: Secondary | ICD-10-CM | POA: Diagnosis not present

## 2016-07-13 DIAGNOSIS — I509 Heart failure, unspecified: Secondary | ICD-10-CM | POA: Diagnosis not present

## 2016-07-13 DIAGNOSIS — I5043 Acute on chronic combined systolic (congestive) and diastolic (congestive) heart failure: Secondary | ICD-10-CM | POA: Diagnosis not present

## 2016-07-13 DIAGNOSIS — G9340 Encephalopathy, unspecified: Secondary | ICD-10-CM | POA: Diagnosis not present

## 2016-07-13 DIAGNOSIS — R7881 Bacteremia: Secondary | ICD-10-CM | POA: Diagnosis not present

## 2016-07-13 DIAGNOSIS — M6281 Muscle weakness (generalized): Secondary | ICD-10-CM | POA: Diagnosis not present

## 2016-07-13 DIAGNOSIS — J449 Chronic obstructive pulmonary disease, unspecified: Secondary | ICD-10-CM | POA: Diagnosis not present

## 2016-07-13 DIAGNOSIS — R6520 Severe sepsis without septic shock: Secondary | ICD-10-CM | POA: Diagnosis not present

## 2016-07-13 DIAGNOSIS — E039 Hypothyroidism, unspecified: Secondary | ICD-10-CM | POA: Diagnosis not present

## 2016-07-13 DIAGNOSIS — R41841 Cognitive communication deficit: Secondary | ICD-10-CM | POA: Diagnosis not present

## 2016-07-13 DIAGNOSIS — R509 Fever, unspecified: Secondary | ICD-10-CM | POA: Diagnosis not present

## 2016-07-13 DIAGNOSIS — A419 Sepsis, unspecified organism: Secondary | ICD-10-CM | POA: Diagnosis not present

## 2016-07-13 DIAGNOSIS — R279 Unspecified lack of coordination: Secondary | ICD-10-CM | POA: Diagnosis not present

## 2016-07-13 DIAGNOSIS — R278 Other lack of coordination: Secondary | ICD-10-CM | POA: Diagnosis not present

## 2016-07-13 DIAGNOSIS — A4151 Sepsis due to Escherichia coli [E. coli]: Secondary | ICD-10-CM | POA: Diagnosis not present

## 2016-07-13 DIAGNOSIS — N10 Acute tubulo-interstitial nephritis: Secondary | ICD-10-CM | POA: Diagnosis not present

## 2016-07-13 DIAGNOSIS — N3001 Acute cystitis with hematuria: Secondary | ICD-10-CM | POA: Diagnosis not present

## 2016-07-13 DIAGNOSIS — R2681 Unsteadiness on feet: Secondary | ICD-10-CM | POA: Diagnosis not present

## 2016-07-13 DIAGNOSIS — H578 Other specified disorders of eye and adnexa: Secondary | ICD-10-CM | POA: Diagnosis not present

## 2016-07-13 DIAGNOSIS — Z95810 Presence of automatic (implantable) cardiac defibrillator: Secondary | ICD-10-CM | POA: Diagnosis not present

## 2016-07-13 DIAGNOSIS — N390 Urinary tract infection, site not specified: Secondary | ICD-10-CM | POA: Diagnosis not present

## 2016-07-13 DIAGNOSIS — I10 Essential (primary) hypertension: Secondary | ICD-10-CM | POA: Diagnosis not present

## 2016-07-13 DIAGNOSIS — R262 Difficulty in walking, not elsewhere classified: Secondary | ICD-10-CM | POA: Diagnosis not present

## 2016-07-13 DIAGNOSIS — I951 Orthostatic hypotension: Secondary | ICD-10-CM | POA: Diagnosis not present

## 2016-07-13 LAB — GLUCOSE, CAPILLARY
Glucose-Capillary: 84 mg/dL (ref 65–99)
Glucose-Capillary: 85 mg/dL (ref 65–99)

## 2016-07-13 MED ORDER — LEVOFLOXACIN 500 MG PO TABS: 500.0000 mg | ORAL_TABLET | Freq: Every day | ORAL | 0 refills | Status: DC

## 2016-07-13 NOTE — Care Management Note (Signed)
Case Management Note  Patient Details  Name: [name] MRN: [id number] Date of Birth: 29-Jun-1933  Subjective/Objective:                    Action/Plan: Plan is to d/c to SNF today. CSW mananging.  Expected Discharge Date:     07/13/2016             Expected Discharge Plan:  [facility] SNF  In-House Referral:  Clinical Social Work  Discharge planning Services  CM Consult   Status of Service:  Completed, signed off  If discussed at [employer], dates discussed:    Additional Comments:  Epifanio Lesches, RN 07/13/2016, 12:12 PM

## 2016-07-13 NOTE — Progress Notes (Signed)
NURSING PROGRESS NOTE  [name] [id number] Discharge Data: 07/13/2016 4:37 PM Attending Provider: No att. providers found WUJ:[id number] NEVILL, MD     [name] to be D/C'd Nursing [facility], per MD order.  Discussed with the patient the After Visit Summary and all questions fully answered. All IV's discontinued with no bleeding noted. All belongings returned to patient for patient to take home.   Last Vital Signs:  Blood pressure (!) 123/49, pulse 61, temperature 97.7 F (36.5 C), temperature source Oral, resp. rate 18, height 5\' 2"  (1.575 m), weight 53.4 kg (117 lb 12.8 oz), SpO2 100 %.  Discharge Medication List   Medication List    STOP taking these medications   cefUROXime 500 MG tablet Commonly known as:  CEFTIN   valsartan 160 MG tablet Commonly known as:  DIOVAN     TAKE these medications   acetaminophen 500 MG tablet Commonly known as:  TYLENOL Take 500 mg by mouth 2 (two) times daily.   ALPRAZolam 0.5 MG tablet Commonly known as:  XANAX Take 0.5 mg by mouth at bedtime as needed for sleep.   amLODipine 5 MG tablet Commonly known as:  NORVASC Take 1 tablet (5 mg total) by mouth daily.   carvedilol 12.5 MG tablet Commonly known as:  COREG Take 1 tablet (12.5 mg total) by mouth 2 (two) times daily.   CoQ10 100 MG Caps Take 100 mg by mouth daily.   fluconazole 100 MG tablet Commonly known as:  DIFLUCAN Take 100 mg by mouth once a week. FRIDAYS   levofloxacin 500 MG tablet Commonly known as:  LEVAQUIN Take 1 tablet (500 mg total) by mouth daily. For 7 days Start taking on:  07/14/2016   levothyroxine 112 MCG tablet Commonly known as:  SYNTHROID, LEVOTHROID Take 112 mcg by mouth daily before breakfast.   montelukast 10 MG tablet Commonly known as:  SINGULAIR Take 10 mg by mouth daily.   NAMZARIC 28-10 MG Cp24 Generic drug:  Memantine HCl-Donepezil HCl Take 1 capsule by mouth daily.   nortriptyline 25 MG capsule Commonly known  as:  PAMELOR Take 25 mg by mouth daily.   pantoprazole 40 MG tablet Commonly known as:  PROTONIX Take 40 mg by mouth daily.   Potassium Gluconate 595 MG Caps Take 1 capsule by mouth daily.   PROBIOTIC DAILY PO Take 1 capsule by mouth daily.   UNABLE TO FIND Med Pass 2.0- Drink 4 ounces by mouth two times a day   Vitamin D3 2000 units capsule Take 2,000 Units by mouth daily.      Attempted report x3.

## 2016-07-13 NOTE — Discharge Summary (Signed)
Physician Discharge Summary  [name] ZOX:[id number] DOB: 01/23/1933 DOA: 07/09/2016  PCP: Pearla DubonnetGATES,ROBERT NEVILL, MD  Admit date: 07/09/2016 Discharge date: 07/13/2016  Time spent: 45 minutes  Recommendations for Outpatient Follow-up:  1. PCP Dr.gates in 1 week   Discharge Diagnoses:  Principal Problem:   Sepsis (HCC) Active Problems:   Essential hypertension   Chronic systolic heart failure (HCC)   Automatic implantable cardioverter-defibrillator in situ   COPD (chronic obstructive pulmonary disease) (HCC)   Hypothyroidism   UTI (urinary tract infection)   Acute encephalopathy   Acute renal failure superimposed on stage 3 chronic kidney disease (HCC)   Hyperglycemia   Discharge Condition: stable  Diet recommendation: low sodium  Filed Weights   07/11/16 0700 07/12/16 0528 07/13/16 0348  Weight: 55.5 kg (122 lb 4.8 oz) 53.5 kg (117 lb 14.4 oz) 53.4 kg (117 lb 12.8 oz)    History of present illness:  Pt. with PMH of Chronic CHF, HTN, COPD, AICD implant, hypothyroidism; admitted on 07/09/2016, with complaint of confusion and fever.  Hospital Course:  1. Sepsis (HCC) Escherichia coli UTI with bacteremia  -Right pyelonephritis  -CT renal stone study ruled out obstruction. But suggested right pyelonephritis with mild hydronephrosis, may have passed stone.  -Ultrasound of abdomen unremarkable for any acute abnormality, no hydronephrosis -clinically improved with resolution of fevers and leukocytosis. -Urine and Blood cultures are positive for Escherichia coli sensitive to levaquin, repeat blood cx negative -changed to PO levaquin yesterday, continue for 6more days to complete 10day course  2. Essential hypertension. -BP stable, resumed Coreg  3. Hypothyroidism. Continue Synthroid.  4. Acute kidney injury. -due to Sepsis, ARB -Resolved  5. Acute metabolic encephalopathy. -due to Infection, improved  6. Dementia -stable, resumed aricept and  namenda  Discharge Exam: Vitals:   07/13/16 0603 07/13/16 1327  BP: (!) 134/53 (!) 123/49  Pulse: 65 61  Resp: 16 18  Temp: 97.7 F (36.5 C) 97.7 F (36.5 C)    General: AAOx2 Cardiovascular: S1S2/RRR Respiratory: CTAB  Discharge Instructions   Discharge Instructions    Diet - low sodium heart healthy    Complete by:  As directed    Increase activity slowly    Complete by:  As directed      Current Discharge Medication List    START taking these medications   Details  levofloxacin (LEVAQUIN) 500 MG tablet Take 1 tablet (500 mg total) by mouth daily. For 7 days Qty: 7 tablet, Refills: 0      CONTINUE these medications which have NOT CHANGED   Details  acetaminophen (TYLENOL) 500 MG tablet Take 500 mg by mouth 2 (two) times daily.     ALPRAZolam (XANAX) 0.5 MG tablet Take 0.5 mg by mouth at bedtime as needed for sleep.    amLODipine (NORVASC) 5 MG tablet Take 1 tablet (5 mg total) by mouth daily. Qty: 30 tablet, Refills: 0    Cholecalciferol (VITAMIN D3) 2000 UNITS capsule Take 2,000 Units by mouth daily.     Coenzyme Q10 (COQ10) 100 MG CAPS Take 100 mg by mouth daily.     fluconazole (DIFLUCAN) 100 MG tablet Take 100 mg by mouth once a week. FRIDAYS    levothyroxine (SYNTHROID, LEVOTHROID) 112 MCG tablet Take 112 mcg by mouth daily before breakfast.    NAMZARIC 28-10 MG CP24 Take 1 capsule by mouth daily.    nortriptyline (PAMELOR) 25 MG capsule Take 25 mg by mouth daily.     pantoprazole (PROTONIX) 40 MG tablet Take 40  mg by mouth daily.     Potassium Gluconate 595 MG CAPS Take 1 capsule by mouth daily.    UNABLE TO FIND Med Pass 2.0- Drink 4 ounces by mouth two times a day    carvedilol (COREG) 12.5 MG tablet Take 1 tablet (12.5 mg total) by mouth 2 (two) times daily. Qty: 180 tablet, Refills: 3    montelukast (SINGULAIR) 10 MG tablet Take 10 mg by mouth daily.     Probiotic Product (PROBIOTIC DAILY PO) Take 1 capsule by mouth daily.      STOP  taking these medications     valsartan (DIOVAN) 160 MG tablet      cefUROXime (CEFTIN) 500 MG tablet        Allergies  Allergen Reactions  . Alprazolam Other (See Comments)    REACTION: ulcer's in mouth and extreme constipation  . Doxycycline Other (See Comments)    REACTION: severe rash over entire body  . Fulvicin P-G [Griseofulvin] Other (See Comments)    Severe headaches  . Hydrocodone Other (See Comments)    REACTION: nausea and totally out of it  . Ketoconazole Other (See Comments)    REACTION: terribly weak, voice shook, and dizziness  . Serevent [Salmeterol] Other (See Comments)    Shaking and weakness  . Calcitonin (Salmon) Other (See Comments)    REACTION: rash over entire body  . Morphine And Related Nausea And Vomiting  . Amitriptyline Other (See Comments)    Lethargy   . Cefuroxime Axetil Other (See Comments)    REACTION: either rash or diarrhea  . Co Q 10 [Coenzyme Q10] Other (See Comments)    Pruritus   . Hydrocodone-Acetaminophen Nausea And Vomiting    Lorcet and Percodan  . Lorcet [Hydrocodone-Acetaminophen] Other (See Comments)    Reaction forgotten  . Other     Ketacosol= terribly weak  . Percodan [Oxycodone-Aspirin] Other (See Comments)    Dizziness   . Plendil [Felodipine] Swelling    Causes ankle edema  . Sulfamethoxazole-Trimethoprim Other (See Comments)    REACTION: either rash or diarrhea  . Sulfonamide Derivatives Other (See Comments)    REACTION: reaction forgotten  . Trovan [Alatrofloxacin] Nausea And Vomiting  . Ventolin [Albuterol] Nausea And Vomiting and Other (See Comments)    Causes shakiness too  . Aspirin Other (See Comments)    REACTION: upsets stomach  . Cephalexin Rash  . Ciprofloxacin Rash  . Clonazepam Other (See Comments)    REACTION: 1/2 pill makes grogginess next day  . Erythromycin Rash  . Hydromorphone Rash  . Miacalcin [Calcitonin (Salmon)] Rash    Severe all-over body rash  . Oxycodone-Aspirin Other (See  Comments)    REACTION: nausea  . Penicillins Rash  . Tapazole [Methimazole] Rash   Follow-up Information    GATES,ROBERT NEVILL, MD. Schedule an appointment as soon as possible for a visit in 1 week(s).   Specialty:  Internal Medicine Contact information: [address] [place] [address] [phone]            The results of significant diagnostics from this hospitalization (including imaging, microbiology, ancillary and laboratory) are listed below for reference.    Significant Diagnostic Studies: Dg Chest 2 View  Result Date: 07/09/2016 CLINICAL DATA:  Shortness of breath today. EXAM: CHEST  2 VIEW COMPARISON:  09/24/2015 FINDINGS: Multi lead left-sided pacemaker remains in place, leads unchanged in position. Post median sternotomy with prosthetic mitral valve. Stable cardiomediastinal contours with perihilar scarring. Atherosclerosis of the thoracic aorta.  Chain sutures noted in the right upper lung. No pulmonary edema, focal consolidation or pleural effusion. No acute osseous abnormality. Postsurgical change in the cervical spine is partially included. IMPRESSION: 1. No acute abnormality. 2. Thoracic aortic atherosclerosis. Electronically Signed   By: Rubye Oaks M.D.   On: 07/09/2016 06:04   Ct Renal Stone Study  Result Date: 07/11/2016 CLINICAL DATA:  82 year old female with right flank pain and urinary tract infection. EXAM: CT ABDOMEN AND PELVIS WITHOUT CONTRAST TECHNIQUE: Multidetector CT imaging of the abdomen and pelvis was performed following the standard protocol without IV contrast. COMPARISON:  CT dated 09/04/2014 FINDINGS: Evaluation of this exam is limited in the absence of intravenous contrast. Evaluation is also limited due to respiratory motion artifact and streak artifact caused by metallic density adjacent to the patient. Lower chest: Partially visualized small bilateral pleural effusions. There is associated partial compressive atelectasis  of the lung bases. Pneumonia is not entirely excluded. Clinical correlation is recommended. There is diffuse interstitial and interlobular septal prominence, likely representing interstitial edema. A small right lung base calcified granuloma noted. Partially visualized cardiac pacemaker wires and CABG vascular clips. No intra-abdominal free air.  Small free fluid within the pelvis. Hepatobiliary: The liver appears unremarkable. No intrahepatic biliary ductal dilatation. The gallbladder is mildly distended. No stone or pericholecystic fluid. Pancreas: Unremarkable. No pancreatic ductal dilatation or surrounding inflammatory changes. Spleen: Normal in size without focal abnormality. Adrenals/Urinary Tract: The adrenal glands appear unremarkable. There is mild right hydronephrosis and mild right perinephric stranding. No definite renal calculi identified. No stones noted along the course of the right ureter, however the ureter is not well visualized in its entire course. Findings may be related to a recently passed right renal calculus versus right-sided pyelonephritis. Correlation with urinalysis recommended. There is mild left perinephric stranding. There is no hydronephrosis on the left. The left ureter and urinary bladder appear unremarkable. Stomach/Bowel: Moderate stool throughout the colon. There is no evidence of bowel obstruction or active inflammation. The appendix is not visualized with certainty. No inflammatory changes identified in the right lower quadrant. Vascular/Lymphatic: There is advanced aortoiliac atherosclerotic disease. Evaluation of the vasculature is limited on this noncontrast study. No portal venous gas identified. There is no adenopathy. Reproductive: Hysterectomy. Other: None Musculoskeletal: Scoliosis with multilevel degenerative changes of the spine with osteophyte formation. Left femoral intra medullary rod and transcervical fixation screw. No acute fracture. IMPRESSION: Mild right  hydronephrosis which may be related to a recently passed right renal stone versus pyelonephritis. Correlation with urinalysis recommended. No definite renal calculi identified. Congestive changes versus mild interstitial edema. Partially visualized small bilateral pleural effusions. Electronically Signed   By: Elgie Collard M.D.   On: 07/11/2016 05:02   US Abdomen Limited Ruq  Result Date: 07/09/2016 CLINICAL DATA:  Right upper quadrant pain and fever. EXAM: US ABDOMEN LIMITED - RIGHT UPPER QUADRANT COMPARISON:  None. FINDINGS: Gallbladder: No gallstones or wall thickening visualized. No sonographic Murphy sign noted by sonographer. Common bile duct: Diameter: 2 mm Liver: No focal lesion identified. Within normal limits in parenchymal echogenicity. Note: Tiny right pleural effusion evident. IMPRESSION: 1. Unremarkable right upper quadrant ultrasound. 2. Tiny right pleural effusion. Electronically Signed   By: Kennith Center M.D.   On: 07/09/2016 10:11    Microbiology: Recent Results (from the past 240 hour(s))  Culture, blood (Routine x 2)     Status: Abnormal   Collection Time: 07/09/16  5:30 AM  Result Value Ref Range Status   Specimen Description BLOOD  LEFT ANTECUBITAL  Final   Special Requests BOTTLES DRAWN AEROBIC AND ANAEROBIC  5CC  Final   Culture  Setup Time   Final    GRAM NEGATIVE RODS IN BOTH AEROBIC AND ANAEROBIC BOTTLES Organism ID to follow CRITICAL RESULT CALLED TO, READ BACK BY AND VERIFIED WITH: CALLED PHARM A MASTERS AT 2033 [id number] MARTINB    Culture ESCHERICHIA COLI (A)  Final   Report Status 07/11/2016 FINAL  Final   Organism ID, Bacteria ESCHERICHIA COLI  Final      Susceptibility   Escherichia coli - MIC*    AMPICILLIN >=32 RESISTANT Resistant     CEFAZOLIN <=4 SENSITIVE Sensitive     CEFEPIME <=1 SENSITIVE Sensitive     CEFTAZIDIME <=1 SENSITIVE Sensitive     CEFTRIAXONE <=1 SENSITIVE Sensitive     CIPROFLOXACIN <=0.25 SENSITIVE Sensitive     GENTAMICIN <=1  SENSITIVE Sensitive     IMIPENEM <=0.25 SENSITIVE Sensitive     TRIMETH/SULFA >=320 RESISTANT Resistant     AMPICILLIN/SULBACTAM 4 SENSITIVE Sensitive     PIP/TAZO <=4 SENSITIVE Sensitive     Extended ESBL NEGATIVE Sensitive     * ESCHERICHIA COLI  Blood Culture ID Panel (Reflexed)     Status: Abnormal   Collection Time: 07/09/16  5:30 AM  Result Value Ref Range Status   Enterococcus species NOT DETECTED NOT DETECTED Final   Listeria monocytogenes NOT DETECTED NOT DETECTED Final   Staphylococcus species NOT DETECTED NOT DETECTED Final   Staphylococcus aureus NOT DETECTED NOT DETECTED Final   Streptococcus species NOT DETECTED NOT DETECTED Final   Streptococcus agalactiae NOT DETECTED NOT DETECTED Final   Streptococcus pneumoniae NOT DETECTED NOT DETECTED Final   Streptococcus pyogenes NOT DETECTED NOT DETECTED Final   Acinetobacter baumannii NOT DETECTED NOT DETECTED Final   Enterobacteriaceae species DETECTED (A) NOT DETECTED Final    Comment: CRITICAL RESULT CALLED TO, READ BACK BY AND VERIFIED WITH: CALLED PHARM A MASTERS AT 2033 [phone] MARTINB    Enterobacter cloacae complex NOT DETECTED NOT DETECTED Final   Escherichia coli DETECTED (A) NOT DETECTED Final    Comment: CRITICAL RESULT CALLED TO, READ BACK BY AND VERIFIED WITH: CALLED PHARM A MASTERS AT 2033 [phone] MARTINB    Klebsiella oxytoca NOT DETECTED NOT DETECTED Final   Klebsiella pneumoniae NOT DETECTED NOT DETECTED Final   Proteus species NOT DETECTED NOT DETECTED Final   Serratia marcescens NOT DETECTED NOT DETECTED Final   Carbapenem resistance NOT DETECTED NOT DETECTED Final   Haemophilus influenzae NOT DETECTED NOT DETECTED Final   Neisseria meningitidis NOT DETECTED NOT DETECTED Final   Pseudomonas aeruginosa NOT DETECTED NOT DETECTED Final   Candida albicans NOT DETECTED NOT DETECTED Final   Candida glabrata NOT DETECTED NOT DETECTED Final   Candida krusei NOT DETECTED NOT DETECTED Final   Candida  parapsilosis NOT DETECTED NOT DETECTED Final   Candida tropicalis NOT DETECTED NOT DETECTED Final  Urine culture     Status: Abnormal   Collection Time: 07/09/16  5:41 AM  Result Value Ref Range Status   Specimen Description URINE, RANDOM  Final   Special Requests NONE  Final   Culture 90,000 COLONIES/mL ESCHERICHIA COLI (A)  Final   Report Status 07/11/2016 FINAL  Final   Organism ID, Bacteria ESCHERICHIA COLI (A)  Final      Susceptibility   Escherichia coli - MIC*    AMPICILLIN >=32 RESISTANT Resistant     CEFAZOLIN <=4 SENSITIVE Sensitive     CEFTRIAXONE <=1 SENSITIVE  Sensitive     CIPROFLOXACIN <=0.25 SENSITIVE Sensitive     GENTAMICIN <=1 SENSITIVE Sensitive     IMIPENEM <=0.25 SENSITIVE Sensitive     NITROFURANTOIN <=16 SENSITIVE Sensitive     TRIMETH/SULFA >=320 RESISTANT Resistant     AMPICILLIN/SULBACTAM 4 SENSITIVE Sensitive     PIP/TAZO <=4 SENSITIVE Sensitive     Extended ESBL NEGATIVE Sensitive     * 90,000 COLONIES/mL ESCHERICHIA COLI  Culture, blood (Routine x 2)     Status: Abnormal   Collection Time: 07/09/16  6:15 AM  Result Value Ref Range Status   Specimen Description BLOOD RIGHT HAND  Final   Special Requests IN PEDIATRIC BOTTLE  3CC  Final   Culture  Setup Time   Final    GRAM NEGATIVE RODS IN PEDIATRIC BOTTLE CRITICAL RESULT CALLED TO, READ BACK BY AND VERIFIED WITH: CALLED PHARM A MASTERS AT [id number] MARTINB    Culture (A)  Final    ESCHERICHIA COLI SUSCEPTIBILITIES PERFORMED ON PREVIOUS CULTURE WITHIN THE LAST 5 DAYS.    Report Status 07/11/2016 FINAL  Final  MRSA PCR Screening     Status: Abnormal   Collection Time: 07/09/16 10:21 AM  Result Value Ref Range Status   MRSA by PCR POSITIVE (A) NEGATIVE Final    Comment:        The GeneXpert MRSA Assay (FDA approved for NASAL specimens only), is one component of a comprehensive MRSA colonization surveillance program. It is not intended to diagnose MRSA infection nor to guide or monitor  treatment for MRSA infections. RESULT CALLED TO, READ BACK BY AND VERIFIED WITH: L. WHITAKER 1345 10.15.17 N.MORRIS    Culture, blood (routine x 2)     Status: None (Preliminary result)   Collection Time: 07/11/16  1:50 PM  Result Value Ref Range Status   Specimen Description BLOOD LEFT ARM  Final   Special Requests IN PEDIATRIC BOTTLE 2 CC  Final   Culture NO GROWTH 1 DAY  Final   Report Status PENDING  Incomplete  Culture, blood (routine x 2)     Status: None (Preliminary result)   Collection Time: 07/11/16  1:55 PM  Result Value Ref Range Status   Specimen Description BLOOD LEFT HAND  Final   Special Requests IN PEDIATRIC BOTTLE 2 CC  Final   Culture NO GROWTH 1 DAY  Final   Report Status PENDING  Incomplete     Labs: Basic Metabolic Panel:  Recent Labs Lab 07/09/16 0530 07/09/16 0800 07/10/16 1133 07/11/16 0657 07/12/16 0657  NA 137  --  137 137 136  K 3.6  --  3.3* 3.3* 4.7  CL 100*  --  106 102 107  CO2 23  --  22 25 17*  GLUCOSE 172*  --  125* 110* 97  BUN 60*  --  30* 20 18  CREATININE 2.48* 2.25* 1.37* 1.13* 1.08*  CALCIUM 8.5*  --  8.7* 9.1 9.0  MG  --   --   --  1.9  --    Liver Function Tests:  Recent Labs Lab 07/09/16 0530  AST 33  ALT 20  ALKPHOS 79  BILITOT 0.8  PROT 6.1*  ALBUMIN 2.7*    Recent Labs Lab 07/09/16 0530  LIPASE 15   No results for input(s): AMMONIA in the last 168 hours. CBC:  Recent Labs Lab 07/09/16 0530 07/09/16 0800 07/10/16 1133 07/11/16 0657 07/12/16 0657  WBC 11.0* 13.2* 15.3* 13.7* 10.6*  NEUTROABS 9.9* 11.2*  --   --  7.5  HGB 11.2* 9.9* 11.3* 12.8 12.3  HCT 33.9* 29.8* 34.5* 38.0 36.2  MCV 90.9 91.1 91.3 89.8 89.2  PLT 146* 139* 190 223 221   Cardiac Enzymes: No results for input(s): CKTOTAL, CKMB, CKMBINDEX, TROPONINI in the last 168 hours. BNP: BNP (last 3 results) No results for input(s): BNP in the last 8760 hours.  ProBNP (last 3 results) No results for input(s): PROBNP in the last 8760  hours.  CBG:  Recent Labs Lab 07/12/16 1139 07/12/16 1725 07/12/16 2144 07/13/16 0806 07/13/16 1202  GLUCAP 93 93 109* 84 85       Signed:  Chakita Mcgraw MD.  [facility] 07/13/2016, 2:52 PM

## 2016-07-13 NOTE — Progress Notes (Signed)
Patient will discharge to [facility] Anticipated discharge date: 10/19 Family notified: left message for son Transportation by [facility]- called at 3:15pm  CSW signing off.  Burna Sis, LCSW Clinical Social Worker [phone]

## 2016-07-14 DIAGNOSIS — I10 Essential (primary) hypertension: Secondary | ICD-10-CM | POA: Diagnosis not present

## 2016-07-14 DIAGNOSIS — N10 Acute tubulo-interstitial nephritis: Secondary | ICD-10-CM | POA: Diagnosis not present

## 2016-07-14 DIAGNOSIS — R7881 Bacteremia: Secondary | ICD-10-CM | POA: Diagnosis not present

## 2016-07-16 LAB — CULTURE, BLOOD (ROUTINE X 2)
CULTURE: NO GROWTH
Culture: NO GROWTH

## 2016-07-17 DIAGNOSIS — H578 Other specified disorders of eye and adnexa: Secondary | ICD-10-CM | POA: Diagnosis not present

## 2016-07-19 DIAGNOSIS — I951 Orthostatic hypotension: Secondary | ICD-10-CM | POA: Diagnosis not present

## 2016-07-28 IMAGING — CR DG CHEST 2V
3 series · 3 of 3 positions shown · non-contrast
Comparison: 09/30/2014

CLINICAL DATA: Hypothermia. Confusion. History of ischemic
cardiomyopathy. CHF. COPD. Hypertension.

EXAM:
CHEST  2 VIEW

[w chest lat]
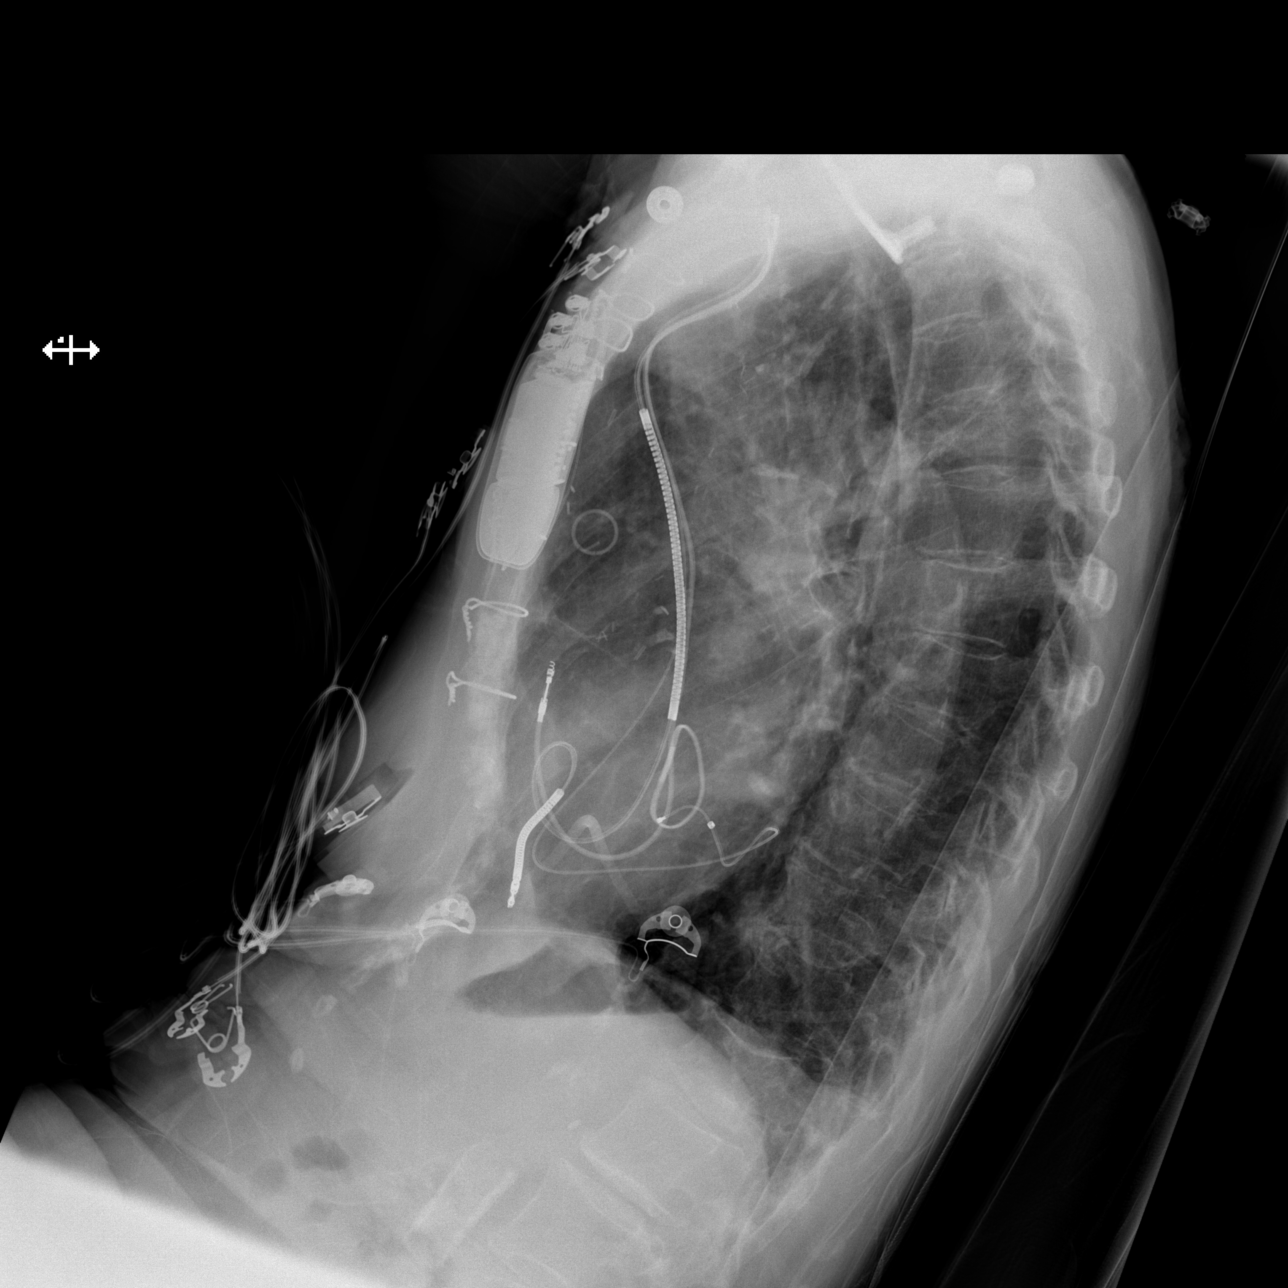

[x chest ap (1 of 2)]
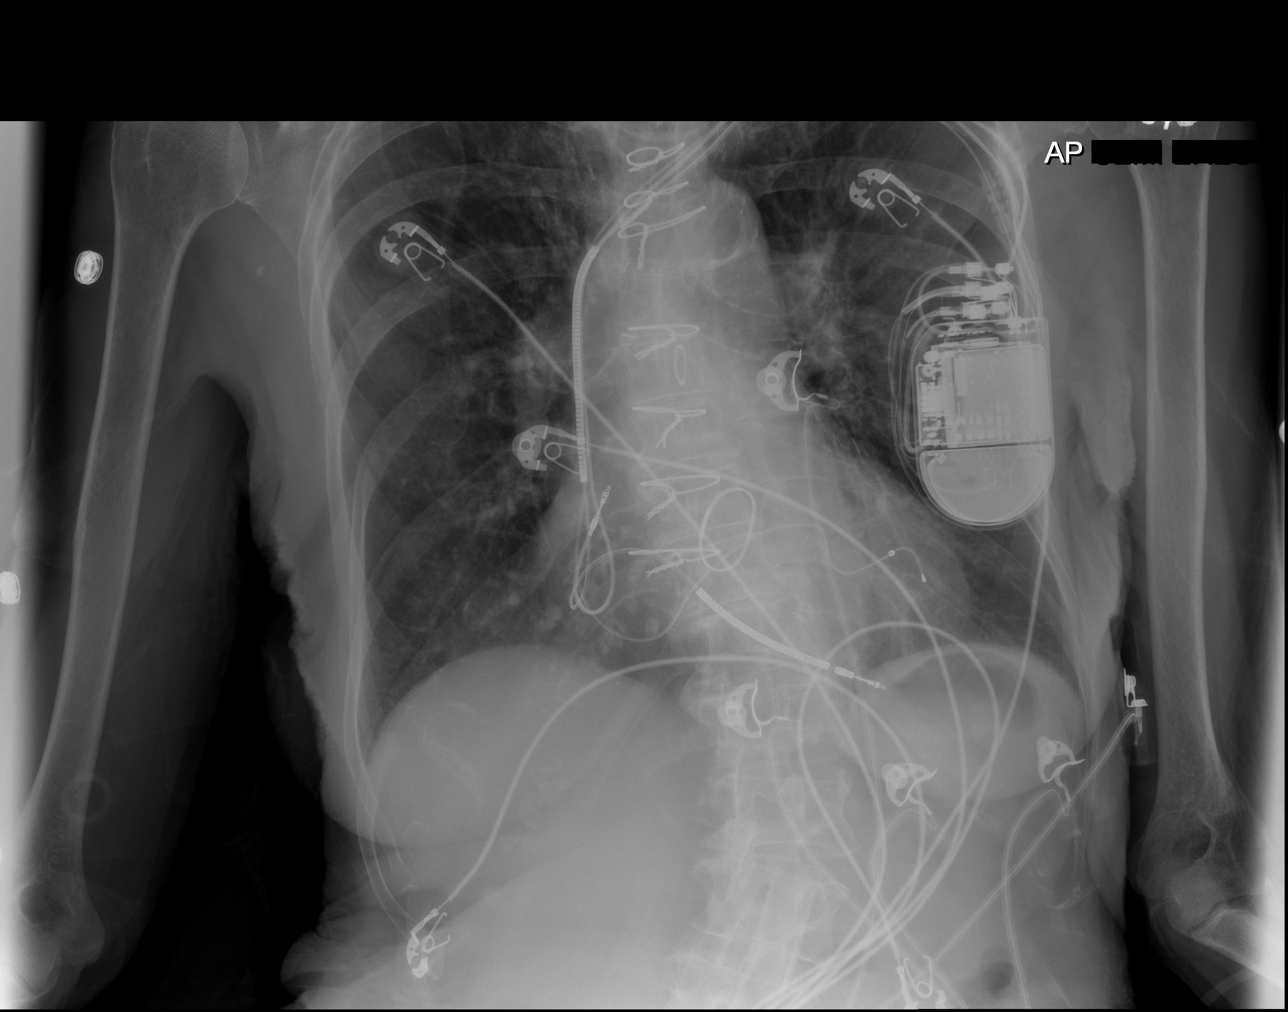

[x chest ap (2 of 2)]
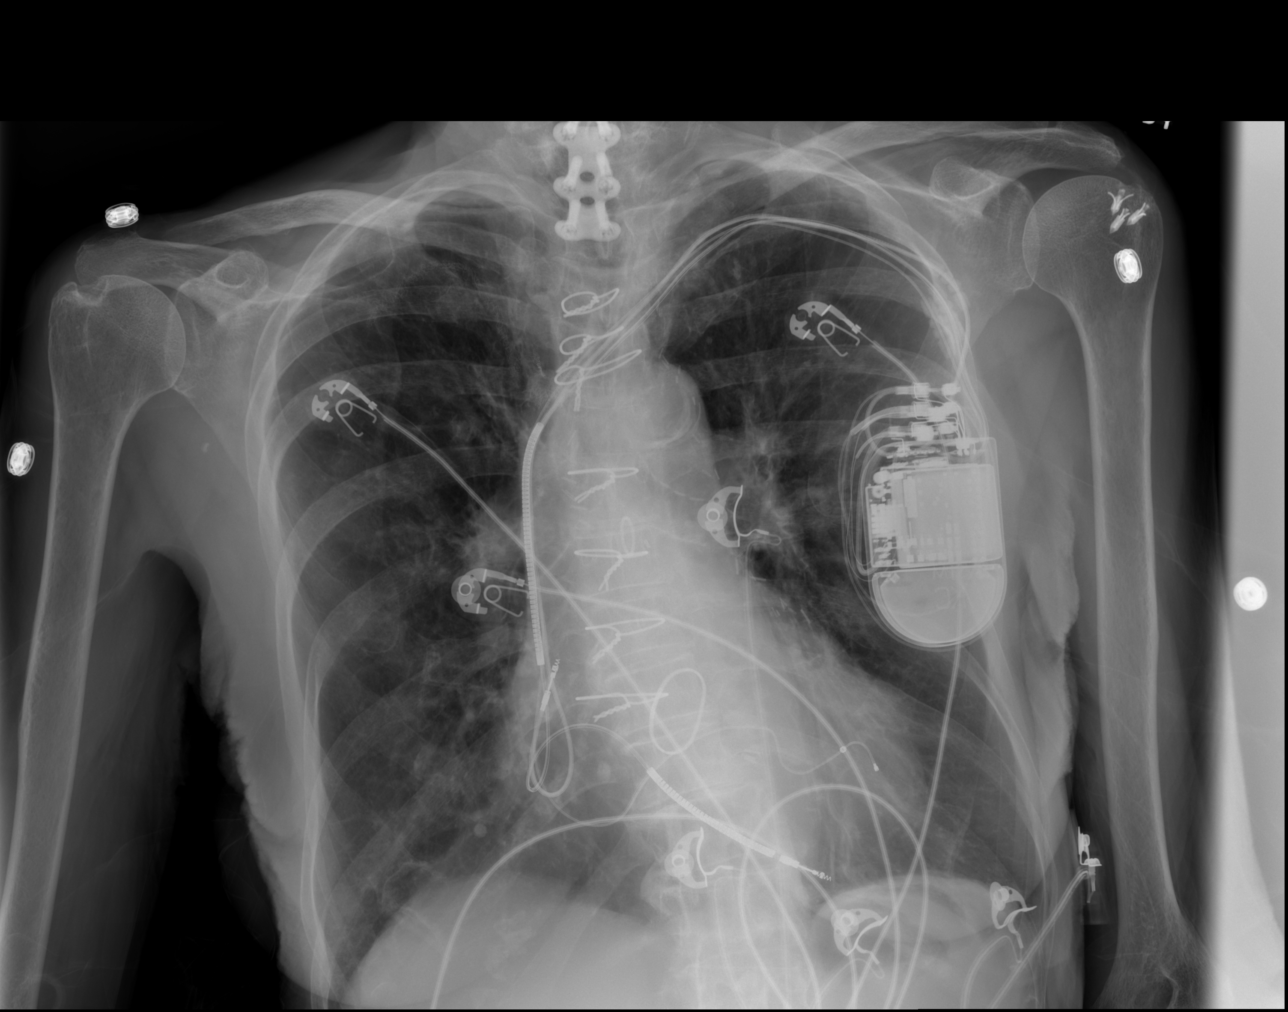

[3 of 3 positions shown; findings below may reference images not displayed]

FINDINGS: Mild pectus excavatum deformity. Lower cervical spine fixation.
Pacer/AICD device. Left rotator cuff repair. Midline trachea. Mild
cardiomegaly. Atherosclerosis in the transverse aorta. No pleural
effusion or pneumothorax. Diffuse peribronchial thickening. No lobar
consolidation.
IMPRESSION: No acute cardiopulmonary disease.

Mild cardiomegaly, without congestive failure.

## 2016-08-07 ENCOUNTER — Ambulatory Visit (INDEPENDENT_AMBULATORY_CARE_PROVIDER_SITE_OTHER): Payer: Medicare Other | Admitting: Internal Medicine

## 2016-08-07 ENCOUNTER — Encounter: Payer: Self-pay | Admitting: Internal Medicine

## 2016-08-07 VITALS — BP 114/62 | HR 70 | Ht 62.0 in | Wt 113.8 lb

## 2016-08-07 DIAGNOSIS — I454 Nonspecific intraventricular block: Secondary | ICD-10-CM

## 2016-08-07 DIAGNOSIS — Z951 Presence of aortocoronary bypass graft: Secondary | ICD-10-CM

## 2016-08-07 DIAGNOSIS — I5022 Chronic systolic (congestive) heart failure: Secondary | ICD-10-CM

## 2016-08-07 DIAGNOSIS — R55 Syncope and collapse: Secondary | ICD-10-CM | POA: Diagnosis not present

## 2016-08-07 DIAGNOSIS — I2589 Other forms of chronic ischemic heart disease: Secondary | ICD-10-CM | POA: Diagnosis not present

## 2016-08-07 DIAGNOSIS — I255 Ischemic cardiomyopathy: Secondary | ICD-10-CM

## 2016-08-07 DIAGNOSIS — I459 Conduction disorder, unspecified: Secondary | ICD-10-CM

## 2016-08-07 DIAGNOSIS — I10 Essential (primary) hypertension: Secondary | ICD-10-CM

## 2016-08-07 MED ORDER — AMLODIPINE BESYLATE 5 MG PO TABS
2.5000 mg | ORAL_TABLET | Freq: Every day | ORAL | 0 refills | Status: DC
Start: 2016-08-07 — End: 2017-02-05

## 2016-08-07 NOTE — Patient Instructions (Signed)
Medication Instructions:  Your physician has recommended you make the following change in your medication:  1) Decrease the Amlodipine to 2.5 mg daily   Labwork: Your physician recommends that you return for lab work today: CBC/BMP   Testing/Procedures: None ordered   Follow-Up: Your physician recommends that you schedule a follow-up appointment in: 2 weeks with Francis Dowse, NP   Any Other Special Instructions Will Be Listed Below (If Applicable).     If you need a refill on your cardiac medications before your next appointment, please call your pharmacy.

## 2016-08-07 NOTE — Progress Notes (Signed)
PCP: Pearla Dubonnet, MD Primary Cardiologist:  Dr Anne Fu  [name] is a 82 y.o. female who presents today for urgent follow-up after having syncope at [facility] today.  Today while standing with PT, she collapsed with syncope.  She was unsteady prior to collapse.  She was noted to be pale and not diaphoretic.  She had rapid return of consciousness.  Her BP upon rousing was 102/64. Today, she denies symptoms of palpitations, chest pain,   lower extremity edema, dizziness,  or ICD shocks.  The patient is otherwise without complaint today.   Past Medical History:  Diagnosis Date  . Anemia    takes iron 3 days per week  . Asthma   . Automatic implantable cardioverter-defibrillator in situ   . BBB (bundle branch block)    s/p BiV ICD implant  . CHF (congestive heart failure) (HCC)   . Chronic pulmonary disease   . Chronic systolic heart failure (HCC)    NYHA class II.  [name] COPD (chronic obstructive pulmonary disease) (HCC)   . Dizziness   . DJD (degenerative joint disease), cervical   . DJD (degenerative joint disease), lumbar   . FH: mitral valve repair    with 26 mm Edwards ring angioplasty,   . Full dentures   . GERD (gastroesophageal reflux disease)   . HTN (hypertension)   . Hx of cardiovascular stress test    Lexiscan Myoview (9/15):  Normal stress nuclear study.  LV Ejection Fraction: 76%  . Hyperthyroidism    following Graves disease  . Ischemic cardiomyopathy    severe. Left ventricular ejection fraction 20%.   . Neuromuscular scoliosis of thoracolumbar region    type of scoliosis was not specified.   . Pacemaker   . Pneumonia    hx  . PONV (postoperative nausea and vomiting)   . Raynaud's syndrome   . Renal artery stenosis (HCC)    Treated with angioplast in 1980 and 1987.   . S/P CABG (coronary artery bypass graft) April 2012   Past Surgical History:  Procedure Laterality Date  . APPENDECTOMY    . BACK SURGERY    . BIV ICD GENERTAOR CHANGE OUT N/A  11/11/2014   Procedure: BIV ICD GENERTAOR CHANGE OUT;  Surgeon: Hillis Range, MD;  Location: [facility] CATH LAB;  Service: Cardiovascular;  Laterality: N/A;  . BREAST LUMPECTOMY     left breast  . carpal tunnel release    . CARPOMETACARPEL SUSPENSION PLASTY Right 11/12/2013   Procedure: SUSPENSION PLASTY RIGHT THUMB, TRAPEZIUM EXCISION;  Surgeon: Nicki Reaper, MD;  Location: [facility];  Service: Orthopedics;  Laterality: Right;  . CATARACT EXTRACTION    . CERVICAL FUSION     C5-6 and C6-7, C4-5 with titanium plates  . CORONARY ARTERY BYPASS GRAFT  2010   mvr/cabg  . ESOPHAGEAL MANOMETRY N/A 04/07/2013   Procedure: ESOPHAGEAL MANOMETRY (EM);  Surgeon: Charolett Bumpers, MD;  Location: [facility];  Service: Endoscopy;  Laterality: N/A;  . EYE SURGERY Bilateral    cataracts  . FACIAL COSMETIC SURGERY    . FEMUR IM NAIL Left 09/29/2014   Procedure: Affixus Trochanteric Femoral Nail;  Surgeon: Eldred Manges, MD;  Location: [facility];  Service: Orthopedics;  Laterality: Left;  . FINGER ARTHROPLASTY  07/10/2012   Procedure: FINGER ARTHROPLASTY;  Surgeon: Nicki Reaper, MD;  Location: [facility];  Service: Orthopedics;  Laterality: Right;  METACARPAL PHALANGEAL ARTHROPLASTIES RIGHT INDEX, MIDDLE, AND RING FINGERS   .  HEMORRHOIDECTOMY WITH HEMORRHOID BANDING    . HERNIA REPAIR     umbilical  . implantation of ICD  2010   BiV ICD implant (SJM) by Dr Amil AmenEdmunds 03/2009  . laminotomy/foraminotomy     with decompression of the L4 nerve root   . left knee arthroscopic    . lung mass removal Right   . precancerous growth     tops of ear removed. bilateral.   . PTCA     of bilateral renal arteries  . renal artery ballon dilation    . renal artery ballon dilation     x2  . REPAIR EXTENSOR TENDON  07/10/2012   Procedure: REPAIR EXTENSOR TENDON;  Surgeon: Nicki ReaperGary R Kuzma, MD;  Location: [facility];  Service: Orthopedics;  Laterality: Right;  METACARPAL PHALANGEAL  REPLACEMENT ARTHROPLASTIES RIGHT INDEX, MIDDLE, AND RING FINGERS    . ROTATOR CUFF REPAIR     right and left  . SYMPATHECTOMY    . TENDON TRANSFER Right 11/12/2013   Procedure: RIGHT ABDUCTOR POLLICUS LONGUS TENDON TRANSFER;  Surgeon: Nicki ReaperGary R Kuzma, MD;  Location: [facility];  Service: Orthopedics;  Laterality: Right;  . TONSILLECTOMY    . TONSILLECTOMY    . TOTAL ABDOMINAL HYSTERECTOMY    . TOTAL KNEE ARTHROPLASTY Left 04/29/2014   Procedure: LEFT TOTAL KNEE ARTHROPLASTY;  Surgeon: Nadara MustardMarcus Duda V, MD;  Location: [facility];  Service: Orthopedics;  Laterality: Left;  . UMBILICAL HERNIA REPAIR    . WEDGE RESECTION  2001   for the right upper lobe for Aspergillus treatement.  Dr. Edwyna ShellBurney apprix 2001.    Current Outpatient Prescriptions  Medication Sig Dispense Refill  . Melatonin 5 MG CAPS Take by mouth as directed.    [name]. amLODipine (NORVASC) 5 MG tablet Take 1 tablet (5 mg total) by mouth daily. 30 tablet 0  . carvedilol (COREG) 12.5 MG tablet Take 1 tablet (12.5 mg total) by mouth 2 (two) times daily. 180 tablet 3  . Cholecalciferol (VITAMIN D3) 2000 UNITS capsule Take 2,000 Units by mouth daily.     . Coenzyme Q10 (COQ10) 100 MG CAPS Take 100 mg by mouth daily.     . fluconazole (DIFLUCAN) 100 MG tablet Take 100 mg by mouth once a week. FRIDAYS    . NAMZARIC 28-10 MG CP24 Take 1 capsule by mouth daily.    . nortriptyline (PAMELOR) 25 MG capsule Take 25 mg by mouth daily.     . pantoprazole (PROTONIX) 40 MG tablet Take 40 mg by mouth daily.     . Potassium Gluconate 595 MG CAPS Take 1 capsule by mouth daily.    [name]. UNABLE TO FIND Med Pass 2.0- Drink 4 ounces by mouth two times a day    . valsartan (DIOVAN) 160 MG tablet Take 1 tablet by mouth as directed.     No current facility-administered medications for this visit.    ROS- all systems are reviewed and negative except as per HPI  Social History   Social History  . Marital status: Married    Spouse name: N/A  . Number of  children: N/A  . Years of education: N/A   Occupational History  . Not on file.   Social History Main Topics  . Smoking status: Never Smoker  . Smokeless tobacco: Never Used     Comment: passive smoker from birth to age 82 (mom and husband)  . Alcohol use No  . Drug use: No  . Sexual activity: Not on  file   Other Topics Concern  . Not on file   Social History Narrative   Married and lives in [place].  Retired   Family History  Problem Relation Age of Onset  . Heart disease Father   . Hypertension Father   . Colon cancer      grandmother     Physical Exam: Vitals:   08/07/16 1204  BP: 114/62  Pulse: 70  Weight: 113 lb 12.8 oz (51.6 kg)  Height: 5\' 2"  (1.575 m)    GEN- The patient is thin, frail and chronically ill appearing, alert, does not remember recent hospitalization Head- normocephalic, atraumatic Eyes-  Sclera clear, conjunctiva pink Ears- hearing intact Oropharynx- clear with dry MM Flat JVP Lungs- Clear to ausculation bilaterally, normal work of breathing Chest- ICD pocket is well healed Heart- Regular rate and rhythm (paced) GI- soft, NT, ND, + BS Extremities- no clubbing, cyanosis, or edema  ICD remote- reviewed in detail today,  See PACEART report   Orthostatic VS for the past 24 hrs:  BP- Lying Pulse- Lying BP- Sitting Pulse- Sitting BP- Standing at 0 minutes Pulse- Standing at 0 minutes  08/07/16 1238 148/70 60 129/72 64 97/54 60  08/07/16 1237 148/70 60 129/72 64 - -   Pt with marked drop in BP in going from lying to standing with marked dizziness requiring her to sit down.  ekg from [facility] is reviewed and reveals AV paced rhythm Qtc 446 msec  Assessment and Plan:  1. Syncope Seen today as an urgent visit for syncope BIV ICD is interrogated and confirms no arrhythmias She has no CV symptoms She is markedly orthostatic in the office today and had profound dizziness with standing which corresponds to drop in BP. She appears dry on  exam. I have encouraged oral hydration.  She is likely dry following recent admit for sepsis.  I think this is the cause for her syncope. Check cbc and bmet today. Reduce norvasc to 2.5mg  daily  2. Chronic systolic dysfunction Dry on exam today Normal CRTD function - see PaceArt report Increase hydration  3. HTN Unstable Reduce norvasc to 2.5 mg daily bmet  4. CAD No ischemic symptoms  Follow up with Merlin and EP NP in 2 weeks  This is an urgent visit for a patient with syncope.  I have spoken with Dr Debby Bud about the patient and also reviewed records from [facility].  She is at risk of decompensation/ repeat hospitalization.  A high level of decision making was required for the visit today.  Signed, Hillis Range, MD 08/07/2016 12:45 PM

## 2016-08-07 NOTE — Addendum Note (Signed)
Addended by: Tonita Phoenix on: 08/07/2016 01:10 PM   Modules accepted: Orders

## 2016-08-08 DIAGNOSIS — R319 Hematuria, unspecified: Secondary | ICD-10-CM | POA: Diagnosis not present

## 2016-08-08 DIAGNOSIS — D649 Anemia, unspecified: Secondary | ICD-10-CM | POA: Diagnosis not present

## 2016-08-08 DIAGNOSIS — N390 Urinary tract infection, site not specified: Secondary | ICD-10-CM | POA: Diagnosis not present

## 2016-08-08 LAB — BASIC METABOLIC PANEL
BUN: 26 mg/dL — AB (ref 7–25)
CHLORIDE: 100 mmol/L (ref 98–110)
CO2: 25 mmol/L (ref 20–31)
CREATININE: 1.51 mg/dL — AB (ref 0.60–0.88)
Calcium: 9.3 mg/dL (ref 8.6–10.4)
Glucose, Bld: 80 mg/dL (ref 65–99)
POTASSIUM: 4.5 mmol/L (ref 3.5–5.3)
Sodium: 137 mmol/L (ref 135–146)

## 2016-08-08 LAB — CBC WITH DIFFERENTIAL/PLATELET
BASOS PCT: 0 %
Basophils Absolute: 0 cells/uL (ref 0–200)
EOS PCT: 4 %
Eosinophils Absolute: 264 cells/uL (ref 15–500)
HEMATOCRIT: 38.8 % (ref 35.0–45.0)
HEMOGLOBIN: 12.5 g/dL (ref 11.7–15.5)
LYMPHS ABS: 1584 {cells}/uL (ref 850–3900)
Lymphocytes Relative: 24 %
MCH: 30.2 pg (ref 27.0–33.0)
MCHC: 32.2 g/dL (ref 32.0–36.0)
MCV: 93.7 fL (ref 80.0–100.0)
MONO ABS: 594 {cells}/uL (ref 200–950)
MPV: 10.5 fL (ref 7.5–12.5)
Monocytes Relative: 9 %
NEUTROS ABS: 4158 {cells}/uL (ref 1500–7800)
Neutrophils Relative %: 63 %
Platelets: 224 10*3/uL (ref 140–400)
RBC: 4.14 MIL/uL (ref 3.80–5.10)
RDW: 13.8 % (ref 11.0–15.0)
WBC: 6.6 10*3/uL (ref 3.8–10.8)

## 2016-08-10 ENCOUNTER — Encounter: Payer: Self-pay | Admitting: Internal Medicine

## 2016-08-15 ENCOUNTER — Ambulatory Visit (INDEPENDENT_AMBULATORY_CARE_PROVIDER_SITE_OTHER): Payer: Self-pay | Admitting: Orthopaedic Surgery

## 2016-08-15 DIAGNOSIS — E039 Hypothyroidism, unspecified: Secondary | ICD-10-CM | POA: Diagnosis not present

## 2016-08-18 DIAGNOSIS — Z79899 Other long term (current) drug therapy: Secondary | ICD-10-CM | POA: Diagnosis not present

## 2016-08-18 DIAGNOSIS — N390 Urinary tract infection, site not specified: Secondary | ICD-10-CM | POA: Diagnosis not present

## 2016-08-18 DIAGNOSIS — R319 Hematuria, unspecified: Secondary | ICD-10-CM | POA: Diagnosis not present

## 2016-08-21 NOTE — Progress Notes (Signed)
PCP: Pearla DubonnetGATES,ROBERT NEVILL, MD Primary Cardiologist:  Dr Anne FuSkains Electrophysiologist: Dr. Johney FrameAllred  [name] MinisterDennis is a 82 y.o. female who presents today to be seen for Dr. Johney FrameAllred, last seen by him 08/07/16 after [facility] a [facility].  Her ICD was interrogated and had no arrhythmias, she was found to be orthostatic and felt likely dry.  Oral hydration was encouraged, he Norvasc down-titrated and sent for labs.  She ad been discharged from the hospital 07/13/16 treated for sepsis (UTI/bacteremia).  PMHx otherwise noted for HTN, hypothyroidism, dementia, COPD, CAD, ICM w/ICD, chronic CHF (systolic).  In review of Dr. Jenel LucksAllred's note: while standing with PT, she collapsed with syncope.  She was unsteady prior to collapse.  She was noted to be pale and not diaphoretic.  She had rapid return of consciousness.  Her BP upon rousing was 102/64.   Today, she denies symptoms of palpitations, chest pain,  lower extremity edema, dizziness,  or ICD shocks.  The patient is otherwise without complaint today. The patient is conversation, does not recall a fainting episode leading up to her last visit. Is a poor historian with a noted hx of dementia.   Device information: SJM CRT-D, gen change 11/11/14, original implant 2010  Past Medical History:  Diagnosis Date  . Anemia    takes iron 3 days per week  . Asthma   . Chronic systolic heart failure (HCC)    NYHA class II.  [name]. COPD (chronic obstructive pulmonary disease) (HCC)   . DJD (degenerative joint disease), cervical   . DJD (degenerative joint disease), lumbar   . FH: mitral valve repair    with 26 mm Edwards ring angioplasty,   . Full dentures   . GERD (gastroesophageal reflux disease)   . HTN (hypertension)   . Hx of cardiovascular stress test    Lexiscan Myoview (9/15):  Normal stress nuclear study.  LV Ejection Fraction: 76%  . Hyperthyroidism    following Graves disease  . Ischemic cardiomyopathy    severe. Left ventricular  ejection fraction 20%.   . Neuromuscular scoliosis of thoracolumbar region    type of scoliosis was not specified.   . Raynaud's syndrome   . Renal artery stenosis (HCC)    Treated with angioplast in 1980 and 1987.   . S/P CABG (coronary artery bypass graft) April 2012   Past Surgical History:  Procedure Laterality Date  . APPENDECTOMY    . BIV ICD GENERTAOR CHANGE OUT N/A 11/11/2014   Procedure: BIV ICD GENERTAOR CHANGE OUT;  Surgeon: Hillis RangeJames Allred, MD;  [facility];  Service: Cardiovascular;  Laterality: N/A;  . BREAST LUMPECTOMY     left breast  . CARPOMETACARPEL SUSPENSION PLASTY Right 11/12/2013   Procedure: SUSPENSION PLASTY RIGHT THUMB, TRAPEZIUM EXCISION;  Surgeon: Nicki ReaperGary R Kuzma, MD;  Location: [facility];  Service: Orthopedics;  Laterality: Right;  . CATARACT EXTRACTION    . CERVICAL FUSION     C5-6 and C6-7, C4-5 with titanium plates  . CORONARY ARTERY BYPASS GRAFT  2010   mvr/cabg  . ESOPHAGEAL MANOMETRY N/A 04/07/2013   Procedure: ESOPHAGEAL MANOMETRY (EM);  Surgeon: Charolett BumpersMartin K Johnson, MD;  Location: [facility];  Service: Endoscopy;  Laterality: N/A;  . FACIAL COSMETIC SURGERY    . FEMUR IM NAIL Left 09/29/2014   Procedure: Affixus Trochanteric Femoral Nail;  Surgeon: Eldred MangesMark C Yates, MD;  Location: [facility];  Service: Orthopedics;  Laterality: Left;  . FINGER ARTHROPLASTY  07/10/2012  Procedure: FINGER ARTHROPLASTY;  Surgeon: Nicki Reaper, MD;  Location: [facility];  Service: Orthopedics;  Laterality: Right;  METACARPAL PHALANGEAL ARTHROPLASTIES RIGHT INDEX, MIDDLE, AND RING FINGERS   . HEMORRHOIDECTOMY WITH HEMORRHOID BANDING    . implantation of ICD  2010   BiV ICD implant (SJM) by Dr Amil Amen 03/2009  . laminotomy/foraminotomy     with decompression of the L4 nerve root   . lung mass removal Right   . precancerous growth     tops of ear removed. bilateral.   . PTCA     of bilateral renal arteries  . REPAIR EXTENSOR TENDON  07/10/2012    Procedure: REPAIR EXTENSOR TENDON;  Surgeon: Nicki Reaper, MD;  Location: [facility];  Service: Orthopedics;  Laterality: Right;  METACARPAL PHALANGEAL REPLACEMENT ARTHROPLASTIES RIGHT INDEX, MIDDLE, AND RING FINGERS    . ROTATOR CUFF REPAIR     right and left  . SYMPATHECTOMY    . TENDON TRANSFER Right 11/12/2013   Procedure: RIGHT ABDUCTOR POLLICUS LONGUS TENDON TRANSFER;  Surgeon: Nicki Reaper, MD;  Location: [facility];  Service: Orthopedics;  Laterality: Right;  . TONSILLECTOMY    . TOTAL ABDOMINAL HYSTERECTOMY    . TOTAL KNEE ARTHROPLASTY Left 04/29/2014   Procedure: LEFT TOTAL KNEE ARTHROPLASTY;  Surgeon: Nadara Mustard, MD;  Location: [facility];  Service: Orthopedics;  Laterality: Left;  . UMBILICAL HERNIA REPAIR    . WEDGE RESECTION  2001   for the right upper lobe for Aspergillus treatement.  Dr. Edwyna Shell apprix 2001.    Current Outpatient Prescriptions  Medication Sig Dispense Refill  . acetaminophen (TYLENOL) 500 MG tablet Take 500 mg by mouth 2 (two) times daily.    [name] ALPRAZolam (XANAX) 0.5 MG tablet Take 0.5 mg by mouth at bedtime as needed for sleep.     [name] amLODipine (NORVASC) 5 MG tablet Take 0.5 tablets (2.5 mg total) by mouth daily. 30 tablet 0  . carvedilol (COREG) 12.5 MG tablet Take 1 tablet (12.5 mg total) by mouth 2 (two) times daily. 180 tablet 3  . Cholecalciferol (VITAMIN D3) 2000 UNITS capsule Take 2,000 Units by mouth daily.     . Coenzyme Q10 (COQ10) 100 MG CAPS Take 100 mg by mouth daily.     . fluconazole (DIFLUCAN) 100 MG tablet Take 100 mg by mouth once a week. FRIDAYS    . levothyroxine (SYNTHROID, LEVOTHROID) 100 MCG tablet Take 100 mcg by mouth daily before breakfast.    . Melatonin 5 MG CAPS Take by mouth as directed.    . montelukast (SINGULAIR) 10 MG tablet Take 10 mg by mouth daily.    [name] NAMZARIC 28-10 MG CP24 Take 1 capsule by mouth daily.    . nortriptyline (PAMELOR) 25 MG capsule Take 25 mg by mouth daily.     .  pantoprazole (PROTONIX) 40 MG tablet Take 40 mg by mouth daily.     . Potassium Gluconate 595 MG CAPS Take 1 capsule by mouth daily.    . Probiotic Product (PROBIOTIC PO) Take 1 capsule by mouth daily.    [name] UNABLE TO FIND Med Pass 2.0- Drink 4 ounces by mouth two times a day     No current facility-administered medications for this visit.    ROS- all systems are reviewed and negative except as per HPI  Social History   Social History  . Marital status: Married    Spouse name: N/A  . Number of children: N/A  .  Years of education: N/A   Occupational History  . Not on file.   Social History Main Topics  . Smoking status: Never Smoker  . Smokeless tobacco: Never Used     Comment: passive smoker from birth to age 12 (mom and husband)  . Alcohol use No  . Drug use: No  . Sexual activity: Not on file   Other Topics Concern  . Not on file   Social History Narrative   Married and lives in [place].  Retired   Family History  Problem Relation Age of Onset  . Heart disease Father   . Hypertension Father   . Colon cancer      grandmother     Physical Exam: Vitals:   08/22/16 1145  BP: 110/62  Pulse: (!) 112  Weight: 113 lb (51.3 kg)  Height: 5\' 2"  (1.575 m)    GEN- The patient is thin, frail and chronically ill appearing, though in NAD, alert, does not remember syncopal event last admission Head- normocephalic, atraumatic Eyes-  Sclera clear, conjunctiva pink Ears- hearing intact Flat JVP Lungs- Clear to ausculation bilaterally, normal work of breathing Chest-  ICD pocket is well healed Heart-   (paced), no significant murmurs appreciated, no gallops or rubs GI- soft, NT, ND Extremities- no clubbing, cyanosis,edema  ICD interrogation done today and reviewed by myself: battery and lead measurements are stable, on NSVT 08/18/16, A lead had a very brief noise episode, this was reproduced with pocket manipulation, lead testing/measurements are stable, 38% A paced, 98%  BiV paced ekg from [facility] was reviewed by Dr. Johney Frame 08/07/16 and revealed AV paced rhythm Qtc 446 msec 11/02/14: TTE Study Conclusions - Left ventricle: The cavity size was normal. Wall thickness was normal. Systolic function was vigorous. The estimated ejection fraction was in the range of 75% to 80%. Wall motion was normal; there were no regional wall motion abnormalities. - Aortic valve: Mildly to moderately calcified annulus. - Mitral valve: Prior procedures included surgical repair. The findings are consistent with moderate stenosis. Valve area by pressure half-time: 1.22 cm^2. Valve area by continuity equation (using LVOT flow): 0.74 cm^2.  06/17/14: Stress myoview Reported as normal by notes  Assessment and Plan:  1. Syncope     Not recurrent, sitting > standing BP here today without symptoms or significant change in BP     Felt to have been dry at the time, and PO intake was encouraged (noted on [facility])     ICD interrogation then was w/o arrhythmia  2. Chronic systolic dysfunction     Weight is stable, exam is euvolemic, negative for orthostatic BP today (sitting to standing)     BiVe pacing 98%     3. ICM w/ICD     There is a very brief episode of noise on the A lead, this was reproduced by pocket manipulation today     Lead measurments, thresholds/sensing and impedence is stable, A pacing 38%     Schedule an in-clinic device check in 1 month to monitor  A lead  3. HTN     stable  4. CAD     No ischemic symptoms  Follow up with mag level today given NSVT episode, device clinic in 1 month and office visit in 6 months sooner if needed,   Signed, Francis Dowse, PA-C 08/22/2016 12:31 PM

## 2016-08-22 ENCOUNTER — Ambulatory Visit (INDEPENDENT_AMBULATORY_CARE_PROVIDER_SITE_OTHER): Payer: Self-pay | Admitting: Orthopaedic Surgery

## 2016-08-22 ENCOUNTER — Ambulatory Visit (INDEPENDENT_AMBULATORY_CARE_PROVIDER_SITE_OTHER): Payer: Medicare Other | Admitting: Physician Assistant

## 2016-08-22 VITALS — BP 110/62 | HR 112 | Ht 62.0 in | Wt 113.0 lb

## 2016-08-22 DIAGNOSIS — R55 Syncope and collapse: Secondary | ICD-10-CM | POA: Diagnosis not present

## 2016-08-22 DIAGNOSIS — I5022 Chronic systolic (congestive) heart failure: Secondary | ICD-10-CM | POA: Diagnosis not present

## 2016-08-22 DIAGNOSIS — I255 Ischemic cardiomyopathy: Secondary | ICD-10-CM | POA: Diagnosis not present

## 2016-08-22 DIAGNOSIS — I2589 Other forms of chronic ischemic heart disease: Secondary | ICD-10-CM

## 2016-08-22 DIAGNOSIS — I472 Ventricular tachycardia, unspecified: Secondary | ICD-10-CM

## 2016-08-22 LAB — MAGNESIUM: Magnesium: 2.1 mg/dL (ref 1.5–2.5)

## 2016-08-22 NOTE — Patient Instructions (Addendum)
Medication Instructions:   Your physician recommends that you continue on your current medications as directed. Please refer to the Current Medication list given to you today.   If you need a refill on your cardiac medications before your next appointment, please call your pharmacy.  Labwork: MAG    Testing/Procedures: NONE ORDERED  TODAY    Follow-Up: IN ONE MONTH WITH DEVICE CLINIC ATRIAL LEAD NOISE   Your physician wants you to follow-up in:  IN  6  MONTHS WITH DR  Johney Frame You will receive a reminder letter in the mail two months in advance. If you don't receive a letter, please call our office to schedule the follow-up appointment.      Any Other Special Instructions Will Be Listed Below (If Applicable).

## 2016-09-01 ENCOUNTER — Telehealth: Payer: Self-pay | Admitting: *Deleted

## 2016-09-01 NOTE — Telephone Encounter (Signed)
ATTEMPT CONTACT NUMBER DISCONNECTED.[name] AND SONS CELL NUMBER VOICEMAIL IS NOT SET UP TO  LEAVE MESSAGES

## 2016-09-01 NOTE — Telephone Encounter (Signed)
-----   Message from [facility] [place] [facility] sent at 08/23/2016  6:21 AM EST ----- Please let the patient know her magnesium level looks good, no changes  Thanks renee

## 2016-09-04 ENCOUNTER — Ambulatory Visit (INDEPENDENT_AMBULATORY_CARE_PROVIDER_SITE_OTHER): Payer: Medicare Other | Admitting: *Deleted

## 2016-09-04 DIAGNOSIS — I255 Ischemic cardiomyopathy: Secondary | ICD-10-CM

## 2016-09-04 NOTE — Progress Notes (Signed)
Remote ICD transmission.   

## 2016-09-15 ENCOUNTER — Ambulatory Visit (INDEPENDENT_AMBULATORY_CARE_PROVIDER_SITE_OTHER): Payer: Medicare Other

## 2016-09-15 ENCOUNTER — Ambulatory Visit (INDEPENDENT_AMBULATORY_CARE_PROVIDER_SITE_OTHER): Payer: Medicare Other | Admitting: Orthopaedic Surgery

## 2016-09-15 ENCOUNTER — Encounter (INDEPENDENT_AMBULATORY_CARE_PROVIDER_SITE_OTHER): Payer: Self-pay | Admitting: Orthopaedic Surgery

## 2016-09-15 DIAGNOSIS — I255 Ischemic cardiomyopathy: Secondary | ICD-10-CM

## 2016-09-15 DIAGNOSIS — Z8781 Personal history of (healed) traumatic fracture: Secondary | ICD-10-CM | POA: Diagnosis not present

## 2016-09-15 DIAGNOSIS — M25562 Pain in left knee: Secondary | ICD-10-CM | POA: Diagnosis not present

## 2016-09-15 NOTE — Progress Notes (Signed)
Office Visit Note   Patient: [name]           Date of Birth: 07-03-1933           MRN: [id number] Visit Date: 09/15/2016              Requested by: Marden Noble, MD [address] [place], [place] [address] PCP: Pearla Dubonnet, MD   Assessment & Plan: Visit Diagnoses:  1. Acute pain of left knee   2. History of femur fracture     Plan: Patient staying a skilled facility she has a walker but can ambulate without her walker balance is not good and recommend she at least use a cane preferably the walker. Continue walking the. There is no effusion of her knee fractures completely healed on x-ray. Despite her fall in September was some increased knee discomfort there is no evidence of the prosthetic problems with her knee arthroplasty.  Follow-Up Instructions: No Follow-up on file.   Orders:  Orders Placed This Encounter  Procedures  . XR FEMUR MIN 2 VIEWS LEFT   No orders of the defined types were placed in this encounter.  Med list from skilled nursing facility reviewed and updated.   Procedures: No procedures performed   Clinical Data: No additional findings.   Subjective: Chief Complaint  Patient presents with  . Left Knee - Pain, Injury    HPI  Review of Systems  Constitutional: Negative for chills and diaphoresis.  HENT: Negative for ear discharge, ear pain and nosebleeds.   Eyes: Negative for discharge and visual disturbance.  Respiratory: Negative for cough, choking and shortness of breath.   Cardiovascular: Negative for chest pain and palpitations.  Gastrointestinal: Negative for abdominal distention and abdominal pain.  Endocrine: Negative for cold intolerance and heat intolerance.  Genitourinary: Negative for flank pain and hematuria.  Musculoskeletal:       Previous right total knee of plasty previous right trochanteric with proximal distal interlock for femur fracture.  Skin: Negative for rash and wound.  Neurological:  Negative for seizures and speech difficulty.  Hematological: Negative for adenopathy. Does not bruise/bleed easily.  Psychiatric/Behavioral: Negative for agitation and suicidal ideas.     Objective: Vital Signs: There were no vitals taken for this visit.  Physical Exam  Constitutional: She is oriented to person, place, and time. She appears well-developed.  HENT:  Head: Normocephalic.  Right Ear: External ear normal.  Left Ear: External ear normal.  Eyes: Pupils are equal, round, and reactive to light.  Neck: No tracheal deviation present. No thyromegaly present.  Cardiovascular: Normal rate.   Pulmonary/Chest: Effort normal.  Abdominal: Soft.  Musculoskeletal:  Patient can ambulate but doesn't seem to walk a straight line her balance is fair. Hip and knee range of motion is good. No ecchymosis no knee effusion. Pulses are normal.  Neurological: She is alert and oriented to person, place, and time.  Skin: Skin is warm and dry.  Psychiatric: She has a normal mood and affect. Her behavior is normal.    Ortho Exam  Specialty Comments:  No specialty comments available.  Imaging: No results found.   PMFS History: Patient Active Problem List   Diagnosis Date Noted  . History of femur fracture 09/15/2016  . Acute pain of left knee 09/15/2016  . Sepsis (HCC) 07/09/2016  . UTI (urinary tract infection) 07/09/2016  . Acute encephalopathy 07/09/2016  . Acute renal failure superimposed on stage 3 chronic kidney disease (HCC) 07/09/2016  .  Hyperglycemia 07/09/2016  . Acute cystitis with hematuria   . AKI (acute kidney injury) (HCC)   . Nausea and vomiting 09/25/2015  . Mild dementia   . Hypertensive urgency 09/24/2015  . Eye swollen, left 11/12/2014  . Rash, right thigh 11/12/2014  . Femur fracture, left (HCC) 09/29/2014  . Hypothyroidism 09/29/2014  . Coronary atherosclerosis of native coronary artery 06/03/2014  . S/P mitral valve repair 06/03/2014  . Total knee  replacement status 04/29/2014  . Dizziness 08/28/2013  . FH: mitral valve repair   . COPD (chronic obstructive pulmonary disease) (HCC)   . BBB (bundle branch block)   . Ischemic cardiomyopathy   . Automatic implantable cardioverter-defibrillator in situ 06/24/2012  . Preop respiratory exam 05/17/2012  . Chronic systolic heart failure (HCC) 03/01/2011  . Other specified forms of chronic ischemic heart disease 03/01/2011  . S/P CABG (coronary artery bypass graft) 12/25/2010  . SHORTNESS OF BREATH (SOB) 05/07/2009  . Hyperlipidemia 05/06/2009  . Essential hypertension 05/06/2009   Past Medical History:  Diagnosis Date  . Anemia    takes iron 3 days per week  . Asthma   . Chronic systolic heart failure (HCC)    NYHA class II.  [name] COPD (chronic obstructive pulmonary disease) (HCC)   . DJD (degenerative joint disease), cervical   . DJD (degenerative joint disease), lumbar   . FH: mitral valve repair    with 26 mm Edwards ring angioplasty,   . Full dentures   . GERD (gastroesophageal reflux disease)   . HTN (hypertension)   . Hx of cardiovascular stress test    Lexiscan Myoview (9/15):  Normal stress nuclear study.  LV Ejection Fraction: 76%  . Hyperthyroidism    following Graves disease  . Ischemic cardiomyopathy    severe. Left ventricular ejection fraction 20%.   . Neuromuscular scoliosis of thoracolumbar region    type of scoliosis was not specified.   . Raynaud's syndrome   . Renal artery stenosis (HCC)    Treated with angioplast in 1980 and 1987.   . S/P CABG (coronary artery bypass graft) April 2012    Family History  Problem Relation Age of Onset  . Heart disease Father   . Hypertension Father   . Colon cancer      grandmother    Past Surgical History:  Procedure Laterality Date  . APPENDECTOMY    . BIV ICD GENERTAOR CHANGE OUT N/A 11/11/2014   Procedure: BIV ICD GENERTAOR CHANGE OUT;  Surgeon: Hillis Range, MD;  Location: [facility] CATH LAB;  Service: Cardiovascular;   Laterality: N/A;  . BREAST LUMPECTOMY     left breast  . CARPOMETACARPEL SUSPENSION PLASTY Right 11/12/2013   Procedure: SUSPENSION PLASTY RIGHT THUMB, TRAPEZIUM EXCISION;  Surgeon: Nicki Reaper, MD;  Location: [facility];  Service: Orthopedics;  Laterality: Right;  . CATARACT EXTRACTION    . CERVICAL FUSION     C5-6 and C6-7, C4-5 with titanium plates  . CORONARY ARTERY BYPASS GRAFT  2010   mvr/cabg  . ESOPHAGEAL MANOMETRY N/A 04/07/2013   Procedure: ESOPHAGEAL MANOMETRY (EM);  Surgeon: Charolett Bumpers, MD;  Location: [facility];  Service: Endoscopy;  Laterality: N/A;  . FACIAL COSMETIC SURGERY    . FEMUR IM NAIL Left 09/29/2014   Procedure: Affixus Trochanteric Femoral Nail;  Surgeon: Eldred Manges, MD;  Location: [facility];  Service: Orthopedics;  Laterality: Left;  . FINGER ARTHROPLASTY  07/10/2012   Procedure: FINGER ARTHROPLASTY;  Surgeon: Nicki Reaper, MD;  Location: [facility];  Service: Orthopedics;  Laterality: Right;  METACARPAL PHALANGEAL ARTHROPLASTIES RIGHT INDEX, MIDDLE, AND RING FINGERS   . HEMORRHOIDECTOMY WITH HEMORRHOID BANDING    . implantation of ICD  2010   BiV ICD implant (SJM) by Dr Amil AmenEdmunds 03/2009  . laminotomy/foraminotomy     with decompression of the L4 nerve root   . lung mass removal Right   . precancerous growth     tops of ear removed. bilateral.   . PTCA     of bilateral renal arteries  . REPAIR EXTENSOR TENDON  07/10/2012   Procedure: REPAIR EXTENSOR TENDON;  Surgeon: Nicki ReaperGary R Kuzma, MD;  Location: [facility];  Service: Orthopedics;  Laterality: Right;  METACARPAL PHALANGEAL REPLACEMENT ARTHROPLASTIES RIGHT INDEX, MIDDLE, AND RING FINGERS    . ROTATOR CUFF REPAIR     right and left  . SYMPATHECTOMY    . TENDON TRANSFER Right 11/12/2013   Procedure: RIGHT ABDUCTOR POLLICUS LONGUS TENDON TRANSFER;  Surgeon: Nicki ReaperGary R Kuzma, MD;  Location: [facility];  Service: Orthopedics;  Laterality: Right;  .  TONSILLECTOMY    . TOTAL ABDOMINAL HYSTERECTOMY    . TOTAL KNEE ARTHROPLASTY Left 04/29/2014   Procedure: LEFT TOTAL KNEE ARTHROPLASTY;  Surgeon: Nadara MustardMarcus Duda V, MD;  Location: [facility];  Service: Orthopedics;  Laterality: Left;  . UMBILICAL HERNIA REPAIR    . WEDGE RESECTION  2001   for the right upper lobe for Aspergillus treatement.  Dr. Edwyna ShellBurney apprix 2001.   Social History   Occupational History  . Not on file.   Social History Main Topics  . Smoking status: Never Smoker  . Smokeless tobacco: Never Used     Comment: passive smoker from birth to age 82 (mom and husband)  . Alcohol use No  . Drug use: No  . Sexual activity: Not on file

## 2016-09-21 ENCOUNTER — Ambulatory Visit (INDEPENDENT_AMBULATORY_CARE_PROVIDER_SITE_OTHER): Payer: Medicare Other | Admitting: *Deleted

## 2016-09-21 DIAGNOSIS — Z95810 Presence of automatic (implantable) cardiac defibrillator: Secondary | ICD-10-CM

## 2016-09-21 LAB — CUP PACEART INCLINIC DEVICE CHECK
HighPow Impedance: 54.8159
Implantable Lead Implant Date: 20100707
Implantable Lead Implant Date: 20100707
Implantable Lead Location: 753859
Implantable Pulse Generator Implant Date: 20160217
Lead Channel Impedance Value: 375 Ohm
Lead Channel Impedance Value: 412.5 Ohm
Lead Channel Impedance Value: 550 Ohm
Lead Channel Pacing Threshold Amplitude: 1 V
Lead Channel Pacing Threshold Amplitude: 1 V
Lead Channel Pacing Threshold Pulse Width: 0.5 ms
Lead Channel Pacing Threshold Pulse Width: 0.5 ms
Lead Channel Pacing Threshold Pulse Width: 0.5 ms
Lead Channel Sensing Intrinsic Amplitude: 12 mV
Lead Channel Sensing Intrinsic Amplitude: 2.1 mV
Lead Channel Setting Pacing Amplitude: 2 V
Lead Channel Setting Pacing Amplitude: 2 V
Lead Channel Setting Pacing Pulse Width: 0.6 ms
Lead Channel Setting Sensing Sensitivity: 0.5 mV
MDC IDC LEAD IMPLANT DT: 20100707
MDC IDC LEAD LOCATION: 753858
MDC IDC LEAD LOCATION: 753860
MDC IDC MSMT LEADCHNL RA PACING THRESHOLD AMPLITUDE: 0.5 V
MDC IDC MSMT LEADCHNL RV PACING THRESHOLD AMPLITUDE: 0.5 V
MDC IDC MSMT LEADCHNL RV PACING THRESHOLD AMPLITUDE: 0.5 V
MDC IDC MSMT LEADCHNL RV PACING THRESHOLD PULSEWIDTH: 0.6 ms
MDC IDC MSMT LEADCHNL RV PACING THRESHOLD PULSEWIDTH: 0.6 ms
MDC IDC PG SERIAL: 7226908
MDC IDC SESS DTM: 20171228124220
MDC IDC SET LEADCHNL LV PACING PULSEWIDTH: 0.5 ms
MDC IDC SET LEADCHNL RV PACING AMPLITUDE: 2.5 V
MDC IDC STAT BRADY RA PERCENT PACED: 43 %
MDC IDC STAT BRADY RV PERCENT PACED: 99 %

## 2016-09-21 NOTE — Progress Notes (Signed)
CRT-D device check in office to re-evaluate atrial lead noise per RU (2088 lead). Thresholds and sensing consistent with previous device measurements. Lead impedance trends stable over time. No mode switch episodes recorded. 1 NSVT episode- 10 beats. Patient bi-ventricularly pacing 99% of the time. Device programmed with appropriate safety margins. Heart failure diagnostics reviewed and trends are stable for patient. No changes made this session. Estimated longevity 4.6 years.  Patient enrolled in remote follow up. Merlin 12/21/16, ROV with JA in 6 months.

## 2016-10-04 DIAGNOSIS — Z961 Presence of intraocular lens: Secondary | ICD-10-CM | POA: Diagnosis not present

## 2016-10-12 DIAGNOSIS — E0590 Thyrotoxicosis, unspecified without thyrotoxic crisis or storm: Secondary | ICD-10-CM | POA: Diagnosis not present

## 2016-10-16 DIAGNOSIS — E039 Hypothyroidism, unspecified: Secondary | ICD-10-CM | POA: Diagnosis not present

## 2016-11-03 DIAGNOSIS — J449 Chronic obstructive pulmonary disease, unspecified: Secondary | ICD-10-CM | POA: Diagnosis not present

## 2016-11-03 DIAGNOSIS — I5032 Chronic diastolic (congestive) heart failure: Secondary | ICD-10-CM | POA: Diagnosis not present

## 2016-11-03 DIAGNOSIS — E039 Hypothyroidism, unspecified: Secondary | ICD-10-CM | POA: Diagnosis not present

## 2016-11-04 DIAGNOSIS — E039 Hypothyroidism, unspecified: Secondary | ICD-10-CM | POA: Diagnosis not present

## 2016-11-04 DIAGNOSIS — D509 Iron deficiency anemia, unspecified: Secondary | ICD-10-CM | POA: Diagnosis not present

## 2016-11-04 DIAGNOSIS — I10 Essential (primary) hypertension: Secondary | ICD-10-CM | POA: Diagnosis not present

## 2016-11-23 DIAGNOSIS — E0590 Thyrotoxicosis, unspecified without thyrotoxic crisis or storm: Secondary | ICD-10-CM | POA: Diagnosis not present

## 2016-12-21 ENCOUNTER — Ambulatory Visit (INDEPENDENT_AMBULATORY_CARE_PROVIDER_SITE_OTHER): Payer: Medicare Other | Admitting: *Deleted

## 2016-12-21 DIAGNOSIS — I255 Ischemic cardiomyopathy: Secondary | ICD-10-CM | POA: Diagnosis not present

## 2016-12-21 LAB — CUP PACEART REMOTE DEVICE CHECK
Battery Remaining Longevity: 53 mo
Battery Remaining Percentage: 69 %
Battery Voltage: 2.98 V
Brady Statistic AP VP Percent: 38 %
Brady Statistic RA Percent Paced: 38 %
HIGH POWER IMPEDANCE MEASURED VALUE: 60 Ohm
HIGH POWER IMPEDANCE MEASURED VALUE: 60 Ohm
Implantable Lead Implant Date: 20100707
Implantable Lead Implant Date: 20100707
Implantable Lead Location: 753859
Implantable Lead Location: 753860
Implantable Lead Model: 7120
Implantable Pulse Generator Implant Date: 20160217
Lead Channel Impedance Value: 550 Ohm
Lead Channel Pacing Threshold Amplitude: 0.5 V
Lead Channel Pacing Threshold Amplitude: 1 V
Lead Channel Pacing Threshold Pulse Width: 0.5 ms
Lead Channel Pacing Threshold Pulse Width: 0.5 ms
Lead Channel Pacing Threshold Pulse Width: 0.6 ms
Lead Channel Sensing Intrinsic Amplitude: 12 mV
Lead Channel Setting Pacing Amplitude: 2 V
Lead Channel Setting Pacing Amplitude: 2.5 V
Lead Channel Setting Pacing Pulse Width: 0.6 ms
Lead Channel Setting Sensing Sensitivity: 0.5 mV
MDC IDC LEAD IMPLANT DT: 20100707
MDC IDC LEAD LOCATION: 753858
MDC IDC MSMT LEADCHNL RA IMPEDANCE VALUE: 390 Ohm
MDC IDC MSMT LEADCHNL RA PACING THRESHOLD AMPLITUDE: 0.25 V
MDC IDC MSMT LEADCHNL RA SENSING INTR AMPL: 1.8 mV
MDC IDC MSMT LEADCHNL RV IMPEDANCE VALUE: 430 Ohm
MDC IDC PG SERIAL: 7226908
MDC IDC SESS DTM: 20180329060017
MDC IDC SET LEADCHNL LV PACING PULSEWIDTH: 0.5 ms
MDC IDC SET LEADCHNL RA PACING AMPLITUDE: 2 V
MDC IDC STAT BRADY AP VS PERCENT: 1.2 %
MDC IDC STAT BRADY AS VP PERCENT: 61 %
MDC IDC STAT BRADY AS VS PERCENT: 1 %

## 2016-12-21 NOTE — Progress Notes (Signed)
Remote ICD transmission.   

## 2016-12-22 ENCOUNTER — Encounter: Payer: Self-pay | Admitting: Cardiology

## 2017-01-05 ENCOUNTER — Encounter: Payer: Self-pay | Admitting: Cardiology

## 2017-01-16 ENCOUNTER — Encounter: Payer: Self-pay | Admitting: Internal Medicine

## 2017-01-25 DIAGNOSIS — E039 Hypothyroidism, unspecified: Secondary | ICD-10-CM | POA: Diagnosis not present

## 2017-01-25 DIAGNOSIS — J449 Chronic obstructive pulmonary disease, unspecified: Secondary | ICD-10-CM | POA: Diagnosis not present

## 2017-01-25 DIAGNOSIS — I10 Essential (primary) hypertension: Secondary | ICD-10-CM | POA: Diagnosis not present

## 2017-01-25 DIAGNOSIS — G309 Alzheimer's disease, unspecified: Secondary | ICD-10-CM | POA: Diagnosis not present

## 2017-02-05 ENCOUNTER — Encounter: Payer: Self-pay | Admitting: Internal Medicine

## 2017-02-05 ENCOUNTER — Ambulatory Visit (INDEPENDENT_AMBULATORY_CARE_PROVIDER_SITE_OTHER): Payer: Medicare Other | Admitting: Internal Medicine

## 2017-02-05 VITALS — BP 126/70 | HR 78 | Ht 62.0 in | Wt 121.4 lb

## 2017-02-05 DIAGNOSIS — I2589 Other forms of chronic ischemic heart disease: Secondary | ICD-10-CM

## 2017-02-05 DIAGNOSIS — I5022 Chronic systolic (congestive) heart failure: Secondary | ICD-10-CM

## 2017-02-05 DIAGNOSIS — I255 Ischemic cardiomyopathy: Secondary | ICD-10-CM

## 2017-02-05 LAB — CUP PACEART INCLINIC DEVICE CHECK
Brady Statistic RA Percent Paced: 46 %
Brady Statistic RV Percent Paced: 99 %
HIGH POWER IMPEDANCE MEASURED VALUE: 53.1068
Implantable Lead Implant Date: 20100707
Implantable Lead Implant Date: 20100707
Implantable Lead Location: 753858
Implantable Lead Location: 753860
Implantable Lead Model: 7120
Lead Channel Impedance Value: 387.5 Ohm
Lead Channel Impedance Value: 425 Ohm
Lead Channel Impedance Value: 537.5 Ohm
Lead Channel Pacing Threshold Amplitude: 0.5 V
Lead Channel Pacing Threshold Amplitude: 1 V
Lead Channel Pacing Threshold Amplitude: 1 V
Lead Channel Pacing Threshold Amplitude: 1 V
Lead Channel Pacing Threshold Pulse Width: 0.5 ms
Lead Channel Pacing Threshold Pulse Width: 0.5 ms
Lead Channel Pacing Threshold Pulse Width: 0.6 ms
Lead Channel Sensing Intrinsic Amplitude: 2 mV
Lead Channel Setting Pacing Amplitude: 2 V
Lead Channel Setting Pacing Amplitude: 2.5 V
Lead Channel Setting Sensing Sensitivity: 0.5 mV
MDC IDC LEAD IMPLANT DT: 20100707
MDC IDC LEAD LOCATION: 753859
MDC IDC MSMT LEADCHNL LV PACING THRESHOLD PULSEWIDTH: 0.5 ms
MDC IDC MSMT LEADCHNL RA PACING THRESHOLD AMPLITUDE: 0.5 V
MDC IDC MSMT LEADCHNL RA PACING THRESHOLD PULSEWIDTH: 0.5 ms
MDC IDC MSMT LEADCHNL RV PACING THRESHOLD AMPLITUDE: 1 V
MDC IDC MSMT LEADCHNL RV PACING THRESHOLD PULSEWIDTH: 0.6 ms
MDC IDC MSMT LEADCHNL RV SENSING INTR AMPL: 12 mV
MDC IDC PG IMPLANT DT: 20160217
MDC IDC PG SERIAL: 7226908
MDC IDC SESS DTM: 20180514132738
MDC IDC SET LEADCHNL LV PACING AMPLITUDE: 2 V
MDC IDC SET LEADCHNL LV PACING PULSEWIDTH: 0.5 ms
MDC IDC SET LEADCHNL RV PACING PULSEWIDTH: 0.6 ms

## 2017-02-05 NOTE — Patient Instructions (Addendum)
Medication Instructions:  Your physician recommends that you continue on your current medications as directed. Please refer to the Current Medication list given to you today.  Follow-Up: Remote monitoring is used to monitor your Pacemaker from home. This monitoring reduces the number of office visits required to check your device to one time per year. It allows [place] to keep an eye on the functioning of your device to ensure it is working properly. You are scheduled for a device check from home on Errika 28th, 2018. You may send your transmission at any time that day. If you have a wireless device, the transmission will be sent automatically. After your physician reviews your transmission, you will receive a postcard with your next transmission date.  Your physician wants you to follow-up in: 12 months with Gypsy Balsam NP, and 6 months with Dr. Anne Fu. You will receive a reminder letter in the mail two months in advance. If you don't receive a letter, please call our office to schedule the follow-up appointment.    Any Other Special Instructions Will Be Listed Below (If Applicable).     If you need a refill on your cardiac medications before your next appointment, please call your pharmacy.

## 2017-02-05 NOTE — Progress Notes (Signed)
PCP: Marden Noble, MD Primary Cardiologist:  [name] is a 83 y.o. female who presents today for routine electrophysiology followup.  Since last being seen in our clinic, the patient reports doing very well.  Today, she denies symptoms of palpitations, chest pain, shortness of breath,  lower extremity edema, dizziness, presyncope, syncope, or ICD shocks.  Dr Debby Bud note from 01/25/17 is reviewed. The patient is otherwise without complaint today.   Past Medical History:  Diagnosis Date  . Anemia    takes iron 3 days per week  . Asthma   . Chronic systolic heart failure (HCC)    NYHA class II.  [name] COPD (chronic obstructive pulmonary disease) (HCC)   . DJD (degenerative joint disease), cervical   . DJD (degenerative joint disease), lumbar   . FH: mitral valve repair    with 26 mm Edwards ring angioplasty,   . Full dentures   . GERD (gastroesophageal reflux disease)   . HTN (hypertension)   . Hx of cardiovascular stress test    Lexiscan Myoview (9/15):  Normal stress nuclear study.  LV Ejection Fraction: 76%  . Hyperthyroidism    following Graves disease  . Ischemic cardiomyopathy    severe. Left ventricular ejection fraction 20%.   . Neuromuscular scoliosis of thoracolumbar region    type of scoliosis was not specified.   . Raynaud's syndrome   . Renal artery stenosis (HCC)    Treated with angioplast in 1980 and 1987.   . S/P CABG (coronary artery bypass graft) April 2012   Past Surgical History:  Procedure Laterality Date  . APPENDECTOMY    . BIV ICD GENERTAOR CHANGE OUT N/A 11/11/2014   Procedure: BIV ICD GENERTAOR CHANGE OUT;  Surgeon: Hillis Range, MD;  Location: [facility] CATH LAB;  Service: Cardiovascular;  Laterality: N/A;  . BREAST LUMPECTOMY     left breast  . CARPOMETACARPEL SUSPENSION PLASTY Right 11/12/2013   Procedure: SUSPENSION PLASTY RIGHT THUMB, TRAPEZIUM EXCISION;  Surgeon: Nicki Reaper, MD;  Location: [facility];  Service: Orthopedics;   Laterality: Right;  . CATARACT EXTRACTION    . CERVICAL FUSION     C5-6 and C6-7, C4-5 with titanium plates  . CORONARY ARTERY BYPASS GRAFT  2010   mvr/cabg  . ESOPHAGEAL MANOMETRY N/A 04/07/2013   Procedure: ESOPHAGEAL MANOMETRY (EM);  Surgeon: Charolett Bumpers, MD;  Location: [facility];  Service: Endoscopy;  Laterality: N/A;  . FACIAL COSMETIC SURGERY    . FEMUR IM NAIL Left 09/29/2014   Procedure: Affixus Trochanteric Femoral Nail;  Surgeon: Eldred Manges, MD;  Location: [facility];  Service: Orthopedics;  Laterality: Left;  . FINGER ARTHROPLASTY  07/10/2012   Procedure: FINGER ARTHROPLASTY;  Surgeon: Nicki Reaper, MD;  Location: [facility];  Service: Orthopedics;  Laterality: Right;  METACARPAL PHALANGEAL ARTHROPLASTIES RIGHT INDEX, MIDDLE, AND RING FINGERS   . HEMORRHOIDECTOMY WITH HEMORRHOID BANDING    . implantation of ICD  2010   BiV ICD implant (SJM) by Dr Amil Amen 03/2009  . laminotomy/foraminotomy     with decompression of the L4 nerve root   . lung mass removal Right   . precancerous growth     tops of ear removed. bilateral.   . PTCA     of bilateral renal arteries  . REPAIR EXTENSOR TENDON  07/10/2012   Procedure: REPAIR EXTENSOR TENDON;  Surgeon: Nicki Reaper, MD;  Location: [facility];  Service: Orthopedics;  Laterality: Right;  METACARPAL PHALANGEAL REPLACEMENT  ARTHROPLASTIES RIGHT INDEX, MIDDLE, AND RING FINGERS    . ROTATOR CUFF REPAIR     right and left  . SYMPATHECTOMY    . TENDON TRANSFER Right 11/12/2013   Procedure: RIGHT ABDUCTOR POLLICUS LONGUS TENDON TRANSFER;  Surgeon: Nicki Reaper, MD;  Location: [facility];  Service: Orthopedics;  Laterality: Right;  . TONSILLECTOMY    . TOTAL ABDOMINAL HYSTERECTOMY    . TOTAL KNEE ARTHROPLASTY Left 04/29/2014   Procedure: LEFT TOTAL KNEE ARTHROPLASTY;  Surgeon: Nadara Mustard, MD;  Location: [facility];  Service: Orthopedics;  Laterality: Left;  . UMBILICAL HERNIA REPAIR    . WEDGE  RESECTION  2001   for the right upper lobe for Aspergillus treatement.  Dr. Edwyna Shell apprix 2001.    ROS- all systems are reviewed and negative except as per HPI above  Current Outpatient Prescriptions  Medication Sig Dispense Refill  . acetaminophen (TYLENOL) 500 MG tablet Take 500 mg by mouth 2 (two) times daily.    [name] amLODipine (NORVASC) 2.5 MG tablet Take 1 tablet by mouth daily.    . carvedilol (COREG) 12.5 MG tablet Take 1 tablet (12.5 mg total) by mouth 2 (two) times daily. 180 tablet 3  . Cholecalciferol (VITAMIN D3) 2000 UNITS capsule Take 2,000 Units by mouth daily.     . Coenzyme Q10 (COQ10) 100 MG CAPS Take 100 mg by mouth daily.     [name] levothyroxine (SYNTHROID, LEVOTHROID) 50 MCG tablet Take 1 tablet by mouth daily before breakfast.    . montelukast (SINGULAIR) 10 MG tablet Take 10 mg by mouth at bedtime.    [name] NAMZARIC 28-10 MG CP24 Take 1 capsule by mouth daily.    . nortriptyline (PAMELOR) 25 MG capsule Take 25 mg by mouth daily.     . pantoprazole (PROTONIX) 40 MG tablet Take 40 mg by mouth daily.     . potassium gluconate 595 (99 K) MG TABS tablet Take 595 mg by mouth daily.    . Probiotic Product (PROBIOTIC COLON SUPPORT PO) Take 1 tablet by mouth daily.    [name] UNABLE TO FIND Med Pass 2.0- Drink 4 ounces by mouth two times a day     No current facility-administered medications for this visit.     Physical Exam: Vitals:   02/05/17 1023  BP: 126/70  Pulse: 78  Weight: 121 lb 6.4 oz (55.1 kg)  Height: 5\' 2"  (1.575 m)    GEN- The patient is well appearing, alert and oriented x 3 today.   Head- normocephalic, atraumatic Eyes-  Sclera clear, conjunctiva pink Ears- hearing intact Oropharynx- clear Lungs- Clear to ausculation bilaterally, normal work of breathing Chest- ICD pocket is well healed Heart- Regular rate and rhythm, no murmurs, rubs or gallops, PMI not laterally displaced GI- soft, NT, ND, + BS Extremities- no clubbing, cyanosis, or edema  ICD  interrogation- personally reviewed in detail today,  See PACEART report  Assessment and Plan:  1.  Chronic systolic dysfunction euvolemic today Stable on an appropriate medical regimen Normal ICD function See Pace Art report No changes today Chronic atrial lead noise is noted but minimal and without clinical sequela,  Will continue to monitor.  Atrial lead parameters are otherwise normal  2. Syncope Resolved without recurrence  3. HTN Stable No change required today  4. CAD No ischemic symptoms No changes today  Merlin Return to see EP NP in 1 year Follow-up with Dr Anne Fu as scheduled  Hillis Range MD, [facility] 02/05/2017 10:33 AM

## 2017-03-22 ENCOUNTER — Ambulatory Visit (INDEPENDENT_AMBULATORY_CARE_PROVIDER_SITE_OTHER): Payer: Medicare Other | Admitting: *Deleted

## 2017-03-22 DIAGNOSIS — I255 Ischemic cardiomyopathy: Secondary | ICD-10-CM | POA: Diagnosis not present

## 2017-03-22 DIAGNOSIS — I5022 Chronic systolic (congestive) heart failure: Secondary | ICD-10-CM

## 2017-03-22 LAB — CUP PACEART REMOTE DEVICE CHECK
Battery Remaining Longevity: 50 mo
Battery Remaining Percentage: 65 %
Battery Voltage: 2.98 V
Brady Statistic AS VS Percent: 1 %
Brady Statistic RA Percent Paced: 65 %
HIGH POWER IMPEDANCE MEASURED VALUE: 55 Ohm
HIGH POWER IMPEDANCE MEASURED VALUE: 56 Ohm
Implantable Lead Implant Date: 20100707
Implantable Lead Location: 753858
Implantable Lead Location: 753859
Implantable Lead Location: 753860
Implantable Lead Model: 7120
Implantable Pulse Generator Implant Date: 20160217
Lead Channel Impedance Value: 460 Ohm
Lead Channel Impedance Value: 540 Ohm
Lead Channel Pacing Threshold Amplitude: 1 V
Lead Channel Pacing Threshold Amplitude: 1 V
Lead Channel Pacing Threshold Pulse Width: 0.5 ms
Lead Channel Pacing Threshold Pulse Width: 0.5 ms
Lead Channel Pacing Threshold Pulse Width: 0.6 ms
Lead Channel Sensing Intrinsic Amplitude: 11.9 mV
Lead Channel Setting Pacing Amplitude: 2 V
Lead Channel Setting Pacing Amplitude: 2.5 V
Lead Channel Setting Pacing Pulse Width: 0.6 ms
Lead Channel Setting Sensing Sensitivity: 0.5 mV
MDC IDC LEAD IMPLANT DT: 20100707
MDC IDC LEAD IMPLANT DT: 20100707
MDC IDC MSMT LEADCHNL RA IMPEDANCE VALUE: 380 Ohm
MDC IDC MSMT LEADCHNL RA PACING THRESHOLD AMPLITUDE: 0.5 V
MDC IDC MSMT LEADCHNL RA SENSING INTR AMPL: 1.7 mV
MDC IDC PG SERIAL: 7226908
MDC IDC SESS DTM: 20180628060017
MDC IDC SET LEADCHNL LV PACING PULSEWIDTH: 0.5 ms
MDC IDC SET LEADCHNL RA PACING AMPLITUDE: 2 V
MDC IDC STAT BRADY AP VP PERCENT: 65 %
MDC IDC STAT BRADY AP VS PERCENT: 1 %
MDC IDC STAT BRADY AS VP PERCENT: 35 %

## 2017-03-22 NOTE — Progress Notes (Signed)
Remote ICD transmission.   

## 2017-03-23 ENCOUNTER — Encounter: Payer: Self-pay | Admitting: Cardiology

## 2017-04-06 ENCOUNTER — Encounter: Payer: Self-pay | Admitting: Cardiology

## 2017-04-28 DIAGNOSIS — E039 Hypothyroidism, unspecified: Secondary | ICD-10-CM | POA: Diagnosis not present

## 2017-04-28 DIAGNOSIS — J449 Chronic obstructive pulmonary disease, unspecified: Secondary | ICD-10-CM | POA: Diagnosis not present

## 2017-04-28 DIAGNOSIS — I5043 Acute on chronic combined systolic (congestive) and diastolic (congestive) heart failure: Secondary | ICD-10-CM | POA: Diagnosis not present

## 2017-04-28 DIAGNOSIS — I10 Essential (primary) hypertension: Secondary | ICD-10-CM | POA: Diagnosis not present

## 2017-05-26 DIAGNOSIS — E039 Hypothyroidism, unspecified: Secondary | ICD-10-CM | POA: Diagnosis not present

## 2017-05-26 DIAGNOSIS — E0590 Thyrotoxicosis, unspecified without thyrotoxic crisis or storm: Secondary | ICD-10-CM | POA: Diagnosis not present

## 2017-05-30 DIAGNOSIS — E039 Hypothyroidism, unspecified: Secondary | ICD-10-CM | POA: Diagnosis not present

## 2017-05-30 DIAGNOSIS — I10 Essential (primary) hypertension: Secondary | ICD-10-CM | POA: Diagnosis not present

## 2017-06-06 DIAGNOSIS — E039 Hypothyroidism, unspecified: Secondary | ICD-10-CM | POA: Diagnosis not present

## 2017-06-07 DIAGNOSIS — F0390 Unspecified dementia without behavioral disturbance: Secondary | ICD-10-CM | POA: Diagnosis not present

## 2017-06-07 DIAGNOSIS — F329 Major depressive disorder, single episode, unspecified: Secondary | ICD-10-CM | POA: Diagnosis not present

## 2017-06-07 DIAGNOSIS — I10 Essential (primary) hypertension: Secondary | ICD-10-CM | POA: Diagnosis not present

## 2017-06-07 DIAGNOSIS — G8929 Other chronic pain: Secondary | ICD-10-CM | POA: Diagnosis not present

## 2017-06-07 DIAGNOSIS — E46 Unspecified protein-calorie malnutrition: Secondary | ICD-10-CM | POA: Diagnosis not present

## 2017-06-07 DIAGNOSIS — J302 Other seasonal allergic rhinitis: Secondary | ICD-10-CM | POA: Diagnosis not present

## 2017-06-07 DIAGNOSIS — E039 Hypothyroidism, unspecified: Secondary | ICD-10-CM | POA: Diagnosis not present

## 2017-06-07 DIAGNOSIS — E568 Deficiency of other vitamins: Secondary | ICD-10-CM | POA: Diagnosis not present

## 2017-06-07 DIAGNOSIS — K219 Gastro-esophageal reflux disease without esophagitis: Secondary | ICD-10-CM | POA: Diagnosis not present

## 2017-06-21 ENCOUNTER — Ambulatory Visit (INDEPENDENT_AMBULATORY_CARE_PROVIDER_SITE_OTHER): Payer: Medicare Other | Admitting: *Deleted

## 2017-06-21 DIAGNOSIS — I255 Ischemic cardiomyopathy: Secondary | ICD-10-CM | POA: Diagnosis not present

## 2017-06-21 DIAGNOSIS — I5022 Chronic systolic (congestive) heart failure: Secondary | ICD-10-CM

## 2017-06-21 NOTE — Progress Notes (Signed)
Remote ICD transmission.   

## 2017-06-26 ENCOUNTER — Encounter: Payer: Self-pay | Admitting: Cardiology

## 2017-06-26 LAB — CUP PACEART REMOTE DEVICE CHECK
Battery Remaining Percentage: 62 %
Battery Voltage: 2.96 V
Brady Statistic AP VS Percent: 1 %
Brady Statistic AS VP Percent: 33 %
Brady Statistic RA Percent Paced: 66 %
Date Time Interrogation Session: 20180927060018
HighPow Impedance: 57 Ohm
HighPow Impedance: 57 Ohm
Implantable Lead Implant Date: 20100707
Implantable Lead Location: 753858
Implantable Lead Location: 753860
Implantable Pulse Generator Implant Date: 20160217
Lead Channel Impedance Value: 410 Ohm
Lead Channel Impedance Value: 540 Ohm
Lead Channel Pacing Threshold Amplitude: 1 V
Lead Channel Pacing Threshold Amplitude: 1 V
Lead Channel Pacing Threshold Pulse Width: 0.5 ms
Lead Channel Sensing Intrinsic Amplitude: 12 mV
Lead Channel Setting Pacing Amplitude: 2 V
Lead Channel Setting Pacing Pulse Width: 0.6 ms
Lead Channel Setting Sensing Sensitivity: 0.5 mV
MDC IDC LEAD IMPLANT DT: 20100707
MDC IDC LEAD IMPLANT DT: 20100707
MDC IDC LEAD LOCATION: 753859
MDC IDC MSMT BATTERY REMAINING LONGEVITY: 48 mo
MDC IDC MSMT LEADCHNL LV PACING THRESHOLD PULSEWIDTH: 0.5 ms
MDC IDC MSMT LEADCHNL RA PACING THRESHOLD AMPLITUDE: 0.5 V
MDC IDC MSMT LEADCHNL RA SENSING INTR AMPL: 1.6 mV
MDC IDC MSMT LEADCHNL RV IMPEDANCE VALUE: 440 Ohm
MDC IDC MSMT LEADCHNL RV PACING THRESHOLD PULSEWIDTH: 0.6 ms
MDC IDC PG SERIAL: 7226908
MDC IDC SET LEADCHNL LV PACING AMPLITUDE: 2 V
MDC IDC SET LEADCHNL LV PACING PULSEWIDTH: 0.5 ms
MDC IDC SET LEADCHNL RV PACING AMPLITUDE: 2.5 V
MDC IDC STAT BRADY AP VP PERCENT: 66 %
MDC IDC STAT BRADY AS VS PERCENT: 1 %

## 2017-07-05 DIAGNOSIS — F329 Major depressive disorder, single episode, unspecified: Secondary | ICD-10-CM | POA: Diagnosis not present

## 2017-07-05 DIAGNOSIS — F0390 Unspecified dementia without behavioral disturbance: Secondary | ICD-10-CM | POA: Diagnosis not present

## 2017-07-10 DIAGNOSIS — G8929 Other chronic pain: Secondary | ICD-10-CM | POA: Diagnosis not present

## 2017-07-10 DIAGNOSIS — R54 Age-related physical debility: Secondary | ICD-10-CM | POA: Diagnosis not present

## 2017-07-10 DIAGNOSIS — E46 Unspecified protein-calorie malnutrition: Secondary | ICD-10-CM | POA: Diagnosis not present

## 2017-07-10 DIAGNOSIS — J302 Other seasonal allergic rhinitis: Secondary | ICD-10-CM | POA: Diagnosis not present

## 2017-07-10 DIAGNOSIS — F329 Major depressive disorder, single episode, unspecified: Secondary | ICD-10-CM | POA: Diagnosis not present

## 2017-07-10 DIAGNOSIS — E039 Hypothyroidism, unspecified: Secondary | ICD-10-CM | POA: Diagnosis not present

## 2017-07-10 DIAGNOSIS — F0390 Unspecified dementia without behavioral disturbance: Secondary | ICD-10-CM | POA: Diagnosis not present

## 2017-07-10 DIAGNOSIS — K219 Gastro-esophageal reflux disease without esophagitis: Secondary | ICD-10-CM | POA: Diagnosis not present

## 2017-07-10 DIAGNOSIS — E568 Deficiency of other vitamins: Secondary | ICD-10-CM | POA: Diagnosis not present

## 2017-07-10 DIAGNOSIS — I10 Essential (primary) hypertension: Secondary | ICD-10-CM | POA: Diagnosis not present

## 2017-07-13 ENCOUNTER — Encounter: Payer: Self-pay | Admitting: Cardiology

## 2017-07-19 DIAGNOSIS — J302 Other seasonal allergic rhinitis: Secondary | ICD-10-CM | POA: Diagnosis not present

## 2017-07-19 DIAGNOSIS — M6281 Muscle weakness (generalized): Secondary | ICD-10-CM | POA: Diagnosis not present

## 2017-07-19 DIAGNOSIS — E875 Hyperkalemia: Secondary | ICD-10-CM | POA: Diagnosis not present

## 2017-07-19 DIAGNOSIS — E568 Deficiency of other vitamins: Secondary | ICD-10-CM | POA: Diagnosis not present

## 2017-07-19 DIAGNOSIS — R54 Age-related physical debility: Secondary | ICD-10-CM | POA: Diagnosis not present

## 2017-07-19 DIAGNOSIS — I10 Essential (primary) hypertension: Secondary | ICD-10-CM | POA: Diagnosis not present

## 2017-07-19 DIAGNOSIS — K219 Gastro-esophageal reflux disease without esophagitis: Secondary | ICD-10-CM | POA: Diagnosis not present

## 2017-07-19 DIAGNOSIS — G8929 Other chronic pain: Secondary | ICD-10-CM | POA: Diagnosis not present

## 2017-07-19 DIAGNOSIS — E039 Hypothyroidism, unspecified: Secondary | ICD-10-CM | POA: Diagnosis not present

## 2017-07-24 DIAGNOSIS — F0280 Dementia in other diseases classified elsewhere without behavioral disturbance: Secondary | ICD-10-CM | POA: Diagnosis not present

## 2017-07-24 DIAGNOSIS — G301 Alzheimer's disease with late onset: Secondary | ICD-10-CM | POA: Diagnosis not present

## 2017-08-14 ENCOUNTER — Ambulatory Visit (INDEPENDENT_AMBULATORY_CARE_PROVIDER_SITE_OTHER): Payer: Medicare Other | Admitting: Nurse Practitioner

## 2017-08-14 ENCOUNTER — Encounter: Payer: Self-pay | Admitting: Nurse Practitioner

## 2017-08-14 VITALS — BP 110/50 | HR 91 | Ht 62.0 in | Wt 121.8 lb

## 2017-08-14 DIAGNOSIS — I10 Essential (primary) hypertension: Secondary | ICD-10-CM | POA: Diagnosis not present

## 2017-08-14 DIAGNOSIS — E039 Hypothyroidism, unspecified: Secondary | ICD-10-CM | POA: Diagnosis not present

## 2017-08-14 DIAGNOSIS — I255 Ischemic cardiomyopathy: Secondary | ICD-10-CM

## 2017-08-14 DIAGNOSIS — R0602 Shortness of breath: Secondary | ICD-10-CM | POA: Diagnosis not present

## 2017-08-14 LAB — BASIC METABOLIC PANEL
BUN/Creatinine Ratio: 20 (ref 12–28)
BUN: 28 mg/dL — ABNORMAL HIGH (ref 8–27)
CO2: 24 mmol/L (ref 20–29)
Calcium: 9.6 mg/dL (ref 8.7–10.3)
Chloride: 102 mmol/L (ref 96–106)
Creatinine, Ser: 1.43 mg/dL — ABNORMAL HIGH (ref 0.57–1.00)
GFR calc Af Amer: 39 mL/min/{1.73_m2} — ABNORMAL LOW (ref >59)
GFR calc non Af Amer: 34 mL/min/{1.73_m2} — ABNORMAL LOW (ref >59)
Glucose: 90 mg/dL (ref 65–99)
Potassium: 4.1 mmol/L (ref 3.5–5.2)
Sodium: 141 mmol/L (ref 134–144)

## 2017-08-14 LAB — TSH: TSH: 18.3 u[IU]/mL — ABNORMAL HIGH (ref 0.450–4.500)

## 2017-08-14 LAB — CBC
Hematocrit: 40.5 % (ref 34.0–46.6)
Hemoglobin: 13.2 g/dL (ref 11.1–15.9)
MCH: 30.5 pg (ref 26.6–33.0)
MCHC: 32.6 g/dL (ref 31.5–35.7)
MCV: 94 fL (ref 79–97)
Platelets: 211 10*3/uL (ref 150–379)
RBC: 4.33 x10E6/uL (ref 3.77–5.28)
RDW: 13.3 % (ref 12.3–15.4)
WBC: 12.3 10*3/uL — ABNORMAL HIGH (ref 3.4–10.8)

## 2017-08-14 NOTE — Patient Instructions (Addendum)
We will be checking the following labs today - BMET and CBC, TSH    Medication Instructions:    Continue with your current medicines.     Testing/Procedures To Be Arranged:  N/A  Follow-Up:   See Dr. Johney Frame in 6 months  See Dr. Anne Fu in 12 months.     Other Special Instructions:   N/A    If you need a refill on your cardiac medications before your next appointment, please call your pharmacy.   Call the [facility] office at [phone] if you have any questions, problems or concerns.

## 2017-08-14 NOTE — Progress Notes (Signed)
CARDIOLOGY OFFICE NOTE  Date:  08/14/2017    [name] Date of Birth: Jan 16, 1933 Medical Record #[id number]  PCP:  Marden Noble, MD  Cardiologist:  [facility]  Chief Complaint  Patient presents with  . Cardiomyopathy  . Coronary Artery Disease    Follow up visit - seen for Dr. Mart Piggs    History of Present Illness: [name] is a 83 y.o. female who presents today for a follow up visit. Seen for Dr. Johney Frame.   Last seen here back in May by Dr. Johney Frame.   Had syncope back in November of 2017 - ICD was ok. Found to be orthostatic. Norvasc titrated down.   Other issues include HTN, hypothyroidism, dementia, COPD, CAD, ICM w/ICD, & chronic CHF (systolic).  Comes in today. Here with caregiver from the facility - her first visit with Ms. Maurine Minister today. Says she was here for PPM check. I do not see where this is indicated. Remote from September ok and due for repeat in December. Chronic cough. Not short of breath. No chest pain. Pretty sedentary. Overall, says she is ok.   Past Medical History:  Diagnosis Date  . Anemia    takes iron 3 days per week  . Asthma   . Chronic systolic heart failure (HCC)    NYHA class II.  [name] COPD (chronic obstructive pulmonary disease) (HCC)   . DJD (degenerative joint disease), cervical   . DJD (degenerative joint disease), lumbar   . FH: mitral valve repair    with 26 mm Edwards ring angioplasty,   . Full dentures   . GERD (gastroesophageal reflux disease)   . HTN (hypertension)   . Hx of cardiovascular stress test    Lexiscan Myoview (9/15):  Normal stress nuclear study.  LV Ejection Fraction: 76%  . Hyperthyroidism    following Graves disease  . Ischemic cardiomyopathy    severe. Left ventricular ejection fraction 20%.   . Neuromuscular scoliosis of thoracolumbar region    type of scoliosis was not specified.   . Raynaud's syndrome   . Renal artery stenosis (HCC)    Treated with angioplast in 1980 and 1987.   .  S/P CABG (coronary artery bypass graft) April 2012    Past Surgical History:  Procedure Laterality Date  . Affixus Trochanteric Femoral Nail Left 09/29/2014   Performed by Eldred Manges, MD at [facility] ORS  . APPENDECTOMY    . BIV ICD GENERTAOR CHANGE OUT N/A 11/11/2014   Performed by Hillis Range, MD at [facility] CATH LAB  . BREAST LUMPECTOMY     left breast  . CATARACT EXTRACTION    . CERVICAL FUSION     C5-6 and C6-7, C4-5 with titanium plates  . CORONARY ARTERY BYPASS GRAFT  2010   mvr/cabg  . ESOPHAGEAL MANOMETRY (EM) N/A 04/07/2013   Performed by Charolett Bumpers, MD at [facility] ENDOSCOPY  . FACIAL COSMETIC SURGERY    . FINGER ARTHROPLASTY Right 07/10/2012   Performed by Nicki Reaper, MD at [facility]  . HEMORRHOIDECTOMY WITH HEMORRHOID BANDING    . implantation of ICD  2010   BiV ICD implant (SJM) by Dr Amil Amen 03/2009  . laminotomy/foraminotomy     with decompression of the L4 nerve root   . LEFT TOTAL KNEE ARTHROPLASTY Left 04/29/2014   Performed by Nadara Mustard, MD at [facility] OR  . lung mass removal Right   . precancerous growth     tops  of ear removed. bilateral.   . PTCA     of bilateral renal arteries  . REPAIR EXTENSOR TENDON Right 07/10/2012   Performed by Nicki Reaper, MD at [facility]  . RIGHT ABDUCTOR POLLICUS LONGUS TENDON TRANSFER Right 11/12/2013   Performed by Cindee Salt, MD at [facility]  . ROTATOR CUFF REPAIR     right and left  . SUSPENSION PLASTY RIGHT THUMB, TRAPEZIUM EXCISION Right 11/12/2013   Performed by Cindee Salt, MD at [facility]  . SYMPATHECTOMY    . TONSILLECTOMY    . TOTAL ABDOMINAL HYSTERECTOMY    . UMBILICAL HERNIA REPAIR    . WEDGE RESECTION  2001   for the right upper lobe for Aspergillus treatement.  Dr. Edwyna Shell apprix 2001.     Medications: Current Meds  Medication Sig  . acetaminophen (TYLENOL) 500 MG tablet Take 500 mg by mouth 2 (two) times daily.  [name] amLODipine (NORVASC) 2.5 MG  tablet Take 1 tablet by mouth daily.  . carvedilol (COREG) 12.5 MG tablet Take 1 tablet (12.5 mg total) by mouth 2 (two) times daily.  . Cholecalciferol (VITAMIN D3) 2000 UNITS capsule Take 2,000 Units by mouth daily.   . Coenzyme Q10 (COQ10) 100 MG CAPS Take 100 mg by mouth daily.   [name] levothyroxine (SYNTHROID, LEVOTHROID) 50 MCG tablet Take 1 tablet by mouth daily before breakfast.  . montelukast (SINGULAIR) 10 MG tablet Take 10 mg by mouth at bedtime.  [name] NAMZARIC 28-10 MG CP24 Take 1 capsule by mouth daily.  . nortriptyline (PAMELOR) 25 MG capsule Take 25 mg by mouth daily.   . potassium gluconate 595 (99 K) MG TABS tablet Take 595 mg by mouth daily.  . Probiotic Product (PROBIOTIC COLON SUPPORT PO) Take 1 tablet by mouth daily.  [name] UNABLE TO FIND Med Pass 2.0- Drink 4 ounces by mouth two times a day     Allergies: Allergies  Allergen Reactions  . Alprazolam Other (See Comments)    REACTION: ulcer's in mouth and extreme constipation  . Doxycycline Other (See Comments)    REACTION: severe rash over entire body  . Fulvicin P-G [Griseofulvin] Other (See Comments)    Severe headaches  . Hydrocodone Other (See Comments)    REACTION: nausea and totally out of it  . Ketoconazole Other (See Comments)    REACTION: terribly weak, voice shook, and dizziness  . Serevent [Salmeterol] Other (See Comments)    Shaking and weakness  . Calcitonin (Salmon) Other (See Comments)    REACTION: rash over entire body  . Morphine And Related Nausea And Vomiting  . Amitriptyline Other (See Comments)    Lethargy   . Cefuroxime Axetil Other (See Comments)    REACTION: either rash or diarrhea  . Co Q 10 [Coenzyme Q10] Other (See Comments)    Pruritus   . Hydrocodone-Acetaminophen Nausea And Vomiting    Lorcet and Percodan  . Lorcet [Hydrocodone-Acetaminophen] Other (See Comments)    Reaction forgotten  . Other     Ketacosol= terribly weak  . Percodan [Oxycodone-Aspirin] Other (See Comments)     Dizziness   . Plendil [Felodipine] Swelling    Causes ankle edema  . Sulfamethoxazole-Trimethoprim Other (See Comments)    REACTION: either rash or diarrhea  . Sulfonamide Derivatives Other (See Comments)    REACTION: reaction forgotten  . Trovan [Alatrofloxacin] Nausea And Vomiting  . Ventolin [Albuterol] Nausea And Vomiting and Other (See Comments)    Causes shakiness too  .  Aspirin Other (See Comments)    REACTION: upsets stomach  . Cephalexin Rash  . Ciprofloxacin Rash  . Clonazepam Other (See Comments)    REACTION: 1/2 pill makes grogginess next day  . Erythromycin Rash  . Hydromorphone Rash  . Miacalcin [Calcitonin (Salmon)] Rash    Severe all-over body rash  . Oxycodone-Aspirin Other (See Comments)    REACTION: nausea  . Penicillins Rash  . Tapazole [Methimazole] Rash    Social History: The patient  reports that  has never smoked. she has never used smokeless tobacco. She reports that she does not drink alcohol or use drugs.   Family History: The patient's family history includes Colon cancer in her unknown relative; Heart disease in her father; Hypertension in her father.   Review of Systems: Please see the history of present illness.   Otherwise, the review of systems is positive for none.   All other systems are reviewed and negative.   Physical Exam: VS:  BP (!) 110/50   Pulse 91   Ht 5\' 2"  (1.575 m)   Wt 121 lb 12.8 oz (55.2 kg)   SpO2 (!) 88%   BMI 22.28 kg/m  .  BMI Body mass index is 22.28 kg/m.  Wt Readings from Last 3 Encounters:  08/14/17 121 lb 12.8 oz (55.2 kg)  02/05/17 121 lb 6.4 oz (55.1 kg)  08/22/16 113 lb (51.3 kg)    General: Pleasant. Elderly female. Alert and in no acute distress.   HEENT: Normal.  Neck: Supple, no JVD, carotid bruits, or masses noted.  Cardiac: Regular rate and rhythm. Heart tones are distant.  No edema.  Respiratory:  Lungs are clear to auscultation bilaterally with normal work of breathing.  GI: Soft and  nontender.  MS: No deformity or atrophy. Gait and ROM intact. Little unsteady.  Skin: Warm and dry. Color is normal.  Neuro:  Strength and sensation are intact and no gross focal deficits noted.  Psych: Alert, appropriate and with normal affect.   LABORATORY DATA:  EKG:  EKG is not ordered today.  Lab Results  Component Value Date   WBC 6.6 08/07/2016   HGB 12.5 08/07/2016   HCT 38.8 08/07/2016   PLT 224 08/07/2016   GLUCOSE 80 08/07/2016   ALT 20 07/09/2016   AST 33 07/09/2016   NA 137 08/07/2016   K 4.5 08/07/2016   CL 100 08/07/2016   CREATININE 1.51 (H) 08/07/2016   BUN 26 (H) 08/07/2016   CO2 25 08/07/2016   TSH 0.864 07/09/2016   INR 1.25 07/09/2016   HGBA1C 5.7 (H) 07/09/2016     BNP (last 3 results) No results for input(s): BNP in the last 8760 hours.  ProBNP (last 3 results) No results for input(s): PROBNP in the last 8760 hours.   Other Studies Reviewed Today:  Echo Study Conclusions from 2016  - Left ventricle: The cavity size was normal. Wall thickness was normal. Systolic function was vigorous. The estimated ejection fraction was in the range of 75% to 80%. Wall motion was normal; there were no regional wall motion abnormalities. - Aortic valve: Mildly to moderately calcified annulus. - Mitral valve: Prior procedures included surgical repair. The findings are consistent with moderate stenosis. Valve area by pressure half-time: 1.22 cm^2. Valve area by continuity equation (using LVOT flow): 0.74 cm^2.   Assessment/Plan:  1. Underlying PPM - for remote check in December - see EP in May.   2. Ischemic CM - last EF hyperdynamic. Would favor conservative management  3. Dementia  4. Hypothyroid - check lab today.   Current medicines are reviewed with the patient today.  The patient does not have concerns regarding medicines other than what has been noted above.  The following changes have been made:  See above.  Labs/ tests  ordered today include:    Orders Placed This Encounter  Procedures  . Basic metabolic panel  . CBC  . TSH     Disposition:   FU with Dr. Anne Fu in 12 months. See Dr. Johney Frame in 6 months.   Patient is agreeable to this plan and will call if any problems develop in the interim.   SignedNorma Fredrickson, NP  08/14/2017 10:39 AM  [facility] [address] [place], [place]  [address] Phone: [phone] Fax: [phone]

## 2017-08-20 DIAGNOSIS — R05 Cough: Secondary | ICD-10-CM | POA: Diagnosis not present

## 2017-08-20 DIAGNOSIS — R638 Other symptoms and signs concerning food and fluid intake: Secondary | ICD-10-CM | POA: Diagnosis not present

## 2017-08-22 DIAGNOSIS — E876 Hypokalemia: Secondary | ICD-10-CM | POA: Diagnosis not present

## 2017-08-22 DIAGNOSIS — E039 Hypothyroidism, unspecified: Secondary | ICD-10-CM | POA: Diagnosis not present

## 2017-08-22 DIAGNOSIS — E568 Deficiency of other vitamins: Secondary | ICD-10-CM | POA: Diagnosis not present

## 2017-08-22 DIAGNOSIS — J302 Other seasonal allergic rhinitis: Secondary | ICD-10-CM | POA: Diagnosis not present

## 2017-08-22 DIAGNOSIS — F0390 Unspecified dementia without behavioral disturbance: Secondary | ICD-10-CM | POA: Diagnosis not present

## 2017-08-22 DIAGNOSIS — F329 Major depressive disorder, single episode, unspecified: Secondary | ICD-10-CM | POA: Diagnosis not present

## 2017-08-22 DIAGNOSIS — I10 Essential (primary) hypertension: Secondary | ICD-10-CM | POA: Diagnosis not present

## 2017-08-22 DIAGNOSIS — E875 Hyperkalemia: Secondary | ICD-10-CM | POA: Diagnosis not present

## 2017-08-22 DIAGNOSIS — K219 Gastro-esophageal reflux disease without esophagitis: Secondary | ICD-10-CM | POA: Diagnosis not present

## 2017-08-22 DIAGNOSIS — M6281 Muscle weakness (generalized): Secondary | ICD-10-CM | POA: Diagnosis not present

## 2017-08-22 DIAGNOSIS — G8929 Other chronic pain: Secondary | ICD-10-CM | POA: Diagnosis not present

## 2017-08-22 DIAGNOSIS — R05 Cough: Secondary | ICD-10-CM | POA: Diagnosis not present

## 2017-08-24 DIAGNOSIS — M940 Chondrocostal junction syndrome [Tietze]: Secondary | ICD-10-CM | POA: Diagnosis not present

## 2017-08-24 DIAGNOSIS — R05 Cough: Secondary | ICD-10-CM | POA: Diagnosis not present

## 2017-08-24 DIAGNOSIS — R0989 Other specified symptoms and signs involving the circulatory and respiratory systems: Secondary | ICD-10-CM | POA: Diagnosis not present

## 2017-09-06 DIAGNOSIS — M940 Chondrocostal junction syndrome [Tietze]: Secondary | ICD-10-CM | POA: Diagnosis not present

## 2017-09-06 DIAGNOSIS — R0782 Intercostal pain: Secondary | ICD-10-CM | POA: Diagnosis not present

## 2017-09-12 DIAGNOSIS — R0782 Intercostal pain: Secondary | ICD-10-CM | POA: Diagnosis not present

## 2017-09-12 DIAGNOSIS — R091 Pleurisy: Secondary | ICD-10-CM | POA: Diagnosis not present

## 2017-09-21 ENCOUNTER — Ambulatory Visit (INDEPENDENT_AMBULATORY_CARE_PROVIDER_SITE_OTHER): Payer: Medicare Other | Admitting: *Deleted

## 2017-09-21 DIAGNOSIS — I5022 Chronic systolic (congestive) heart failure: Secondary | ICD-10-CM

## 2017-09-21 DIAGNOSIS — I255 Ischemic cardiomyopathy: Secondary | ICD-10-CM

## 2017-09-21 NOTE — Progress Notes (Signed)
Remote ICD transmission.   

## 2017-09-24 ENCOUNTER — Encounter: Payer: Self-pay | Admitting: Cardiology

## 2017-09-26 DIAGNOSIS — R091 Pleurisy: Secondary | ICD-10-CM | POA: Diagnosis not present

## 2017-09-26 DIAGNOSIS — I10 Essential (primary) hypertension: Secondary | ICD-10-CM | POA: Diagnosis not present

## 2017-09-26 DIAGNOSIS — R05 Cough: Secondary | ICD-10-CM | POA: Diagnosis not present

## 2017-09-26 DIAGNOSIS — M6281 Muscle weakness (generalized): Secondary | ICD-10-CM | POA: Diagnosis not present

## 2017-09-26 DIAGNOSIS — F0390 Unspecified dementia without behavioral disturbance: Secondary | ICD-10-CM | POA: Diagnosis not present

## 2017-09-26 DIAGNOSIS — J302 Other seasonal allergic rhinitis: Secondary | ICD-10-CM | POA: Diagnosis not present

## 2017-09-26 DIAGNOSIS — G8929 Other chronic pain: Secondary | ICD-10-CM | POA: Diagnosis not present

## 2017-09-26 DIAGNOSIS — F329 Major depressive disorder, single episode, unspecified: Secondary | ICD-10-CM | POA: Diagnosis not present

## 2017-09-26 DIAGNOSIS — E876 Hypokalemia: Secondary | ICD-10-CM | POA: Diagnosis not present

## 2017-09-26 DIAGNOSIS — K219 Gastro-esophageal reflux disease without esophagitis: Secondary | ICD-10-CM | POA: Diagnosis not present

## 2017-09-26 DIAGNOSIS — E568 Deficiency of other vitamins: Secondary | ICD-10-CM | POA: Diagnosis not present

## 2017-09-26 DIAGNOSIS — E039 Hypothyroidism, unspecified: Secondary | ICD-10-CM | POA: Diagnosis not present

## 2017-10-09 LAB — CUP PACEART REMOTE DEVICE CHECK
Battery Remaining Longevity: 44 mo
Brady Statistic AP VS Percent: 1 %
Brady Statistic AS VS Percent: 1 %
HIGH POWER IMPEDANCE MEASURED VALUE: 54 Ohm
HighPow Impedance: 54 Ohm
Implantable Lead Implant Date: 20100707
Implantable Lead Location: 753859
Implantable Lead Model: 7120
Implantable Pulse Generator Implant Date: 20160217
Lead Channel Pacing Threshold Amplitude: 0.5 V
Lead Channel Pacing Threshold Pulse Width: 0.5 ms
Lead Channel Sensing Intrinsic Amplitude: 2.1 mV
Lead Channel Setting Pacing Amplitude: 2 V
Lead Channel Setting Pacing Amplitude: 2.5 V
Lead Channel Setting Pacing Pulse Width: 0.5 ms
Lead Channel Setting Sensing Sensitivity: 0.5 mV
MDC IDC LEAD IMPLANT DT: 20100707
MDC IDC LEAD IMPLANT DT: 20100707
MDC IDC LEAD LOCATION: 753858
MDC IDC LEAD LOCATION: 753860
MDC IDC MSMT BATTERY REMAINING PERCENTAGE: 59 %
MDC IDC MSMT BATTERY VOLTAGE: 2.96 V
MDC IDC MSMT LEADCHNL LV IMPEDANCE VALUE: 530 Ohm
MDC IDC MSMT LEADCHNL LV PACING THRESHOLD AMPLITUDE: 1 V
MDC IDC MSMT LEADCHNL LV PACING THRESHOLD PULSEWIDTH: 0.5 ms
MDC IDC MSMT LEADCHNL RA IMPEDANCE VALUE: 380 Ohm
MDC IDC MSMT LEADCHNL RV IMPEDANCE VALUE: 430 Ohm
MDC IDC MSMT LEADCHNL RV PACING THRESHOLD AMPLITUDE: 1 V
MDC IDC MSMT LEADCHNL RV PACING THRESHOLD PULSEWIDTH: 0.6 ms
MDC IDC MSMT LEADCHNL RV SENSING INTR AMPL: 12 mV
MDC IDC SESS DTM: 20181228070016
MDC IDC SET LEADCHNL RA PACING AMPLITUDE: 2 V
MDC IDC SET LEADCHNL RV PACING PULSEWIDTH: 0.6 ms
MDC IDC STAT BRADY AP VP PERCENT: 62 %
MDC IDC STAT BRADY AS VP PERCENT: 37 %
MDC IDC STAT BRADY RA PERCENT PACED: 62 %
Pulse Gen Serial Number: 7226908

## 2017-10-24 DIAGNOSIS — F329 Major depressive disorder, single episode, unspecified: Secondary | ICD-10-CM | POA: Diagnosis not present

## 2017-10-24 DIAGNOSIS — E568 Deficiency of other vitamins: Secondary | ICD-10-CM | POA: Diagnosis not present

## 2017-10-24 DIAGNOSIS — E039 Hypothyroidism, unspecified: Secondary | ICD-10-CM | POA: Diagnosis not present

## 2017-10-24 DIAGNOSIS — E876 Hypokalemia: Secondary | ICD-10-CM | POA: Diagnosis not present

## 2017-10-24 DIAGNOSIS — G8929 Other chronic pain: Secondary | ICD-10-CM | POA: Diagnosis not present

## 2017-10-24 DIAGNOSIS — J302 Other seasonal allergic rhinitis: Secondary | ICD-10-CM | POA: Diagnosis not present

## 2017-10-24 DIAGNOSIS — K219 Gastro-esophageal reflux disease without esophagitis: Secondary | ICD-10-CM | POA: Diagnosis not present

## 2017-10-24 DIAGNOSIS — I10 Essential (primary) hypertension: Secondary | ICD-10-CM | POA: Diagnosis not present

## 2017-10-24 DIAGNOSIS — F0390 Unspecified dementia without behavioral disturbance: Secondary | ICD-10-CM | POA: Diagnosis not present

## 2017-10-24 DIAGNOSIS — M6281 Muscle weakness (generalized): Secondary | ICD-10-CM | POA: Diagnosis not present

## 2017-10-25 DIAGNOSIS — H43813 Vitreous degeneration, bilateral: Secondary | ICD-10-CM | POA: Diagnosis not present

## 2017-10-25 DIAGNOSIS — H18413 Arcus senilis, bilateral: Secondary | ICD-10-CM | POA: Diagnosis not present

## 2017-10-25 DIAGNOSIS — H353131 Nonexudative age-related macular degeneration, bilateral, early dry stage: Secondary | ICD-10-CM | POA: Diagnosis not present

## 2017-10-25 DIAGNOSIS — Z961 Presence of intraocular lens: Secondary | ICD-10-CM | POA: Diagnosis not present

## 2017-10-25 DIAGNOSIS — H35033 Hypertensive retinopathy, bilateral: Secondary | ICD-10-CM | POA: Diagnosis not present

## 2017-10-26 DIAGNOSIS — I10 Essential (primary) hypertension: Secondary | ICD-10-CM | POA: Diagnosis not present

## 2017-10-26 DIAGNOSIS — F0390 Unspecified dementia without behavioral disturbance: Secondary | ICD-10-CM | POA: Diagnosis not present

## 2017-10-26 DIAGNOSIS — M6281 Muscle weakness (generalized): Secondary | ICD-10-CM | POA: Diagnosis not present

## 2017-10-26 DIAGNOSIS — K219 Gastro-esophageal reflux disease without esophagitis: Secondary | ICD-10-CM | POA: Diagnosis not present

## 2017-10-26 DIAGNOSIS — J449 Chronic obstructive pulmonary disease, unspecified: Secondary | ICD-10-CM | POA: Diagnosis not present

## 2017-10-26 DIAGNOSIS — E568 Deficiency of other vitamins: Secondary | ICD-10-CM | POA: Diagnosis not present

## 2017-10-26 DIAGNOSIS — J302 Other seasonal allergic rhinitis: Secondary | ICD-10-CM | POA: Diagnosis not present

## 2017-10-26 DIAGNOSIS — E039 Hypothyroidism, unspecified: Secondary | ICD-10-CM | POA: Diagnosis not present

## 2017-10-26 DIAGNOSIS — E876 Hypokalemia: Secondary | ICD-10-CM | POA: Diagnosis not present

## 2017-10-26 DIAGNOSIS — F329 Major depressive disorder, single episode, unspecified: Secondary | ICD-10-CM | POA: Diagnosis not present

## 2017-10-26 DIAGNOSIS — G8929 Other chronic pain: Secondary | ICD-10-CM | POA: Diagnosis not present

## 2017-11-15 DIAGNOSIS — H903 Sensorineural hearing loss, bilateral: Secondary | ICD-10-CM | POA: Diagnosis not present

## 2017-11-26 DIAGNOSIS — J449 Chronic obstructive pulmonary disease, unspecified: Secondary | ICD-10-CM | POA: Diagnosis not present

## 2017-11-26 DIAGNOSIS — E876 Hypokalemia: Secondary | ICD-10-CM | POA: Diagnosis not present

## 2017-11-26 DIAGNOSIS — G8929 Other chronic pain: Secondary | ICD-10-CM | POA: Diagnosis not present

## 2017-11-26 DIAGNOSIS — I10 Essential (primary) hypertension: Secondary | ICD-10-CM | POA: Diagnosis not present

## 2017-11-26 DIAGNOSIS — K219 Gastro-esophageal reflux disease without esophagitis: Secondary | ICD-10-CM | POA: Diagnosis not present

## 2017-11-26 DIAGNOSIS — E568 Deficiency of other vitamins: Secondary | ICD-10-CM | POA: Diagnosis not present

## 2017-11-26 DIAGNOSIS — F329 Major depressive disorder, single episode, unspecified: Secondary | ICD-10-CM | POA: Diagnosis not present

## 2017-11-26 DIAGNOSIS — E039 Hypothyroidism, unspecified: Secondary | ICD-10-CM | POA: Diagnosis not present

## 2017-11-26 DIAGNOSIS — F0390 Unspecified dementia without behavioral disturbance: Secondary | ICD-10-CM | POA: Diagnosis not present

## 2017-11-26 DIAGNOSIS — M6281 Muscle weakness (generalized): Secondary | ICD-10-CM | POA: Diagnosis not present

## 2017-11-26 DIAGNOSIS — J302 Other seasonal allergic rhinitis: Secondary | ICD-10-CM | POA: Diagnosis not present

## 2017-12-06 DIAGNOSIS — F0280 Dementia in other diseases classified elsewhere without behavioral disturbance: Secondary | ICD-10-CM | POA: Diagnosis not present

## 2017-12-06 DIAGNOSIS — G301 Alzheimer's disease with late onset: Secondary | ICD-10-CM | POA: Diagnosis not present

## 2017-12-18 DIAGNOSIS — G301 Alzheimer's disease with late onset: Secondary | ICD-10-CM | POA: Diagnosis not present

## 2017-12-18 DIAGNOSIS — F329 Major depressive disorder, single episode, unspecified: Secondary | ICD-10-CM | POA: Diagnosis not present

## 2017-12-18 DIAGNOSIS — R630 Anorexia: Secondary | ICD-10-CM | POA: Diagnosis not present

## 2017-12-25 ENCOUNTER — Encounter: Payer: Medicare Other | Admitting: *Deleted

## 2017-12-25 ENCOUNTER — Telehealth: Payer: Self-pay | Admitting: Cardiology

## 2017-12-25 DIAGNOSIS — M6281 Muscle weakness (generalized): Secondary | ICD-10-CM | POA: Diagnosis not present

## 2017-12-25 DIAGNOSIS — K219 Gastro-esophageal reflux disease without esophagitis: Secondary | ICD-10-CM | POA: Diagnosis not present

## 2017-12-25 DIAGNOSIS — E876 Hypokalemia: Secondary | ICD-10-CM | POA: Diagnosis not present

## 2017-12-25 DIAGNOSIS — E039 Hypothyroidism, unspecified: Secondary | ICD-10-CM | POA: Diagnosis not present

## 2017-12-25 DIAGNOSIS — J302 Other seasonal allergic rhinitis: Secondary | ICD-10-CM | POA: Diagnosis not present

## 2017-12-25 DIAGNOSIS — J449 Chronic obstructive pulmonary disease, unspecified: Secondary | ICD-10-CM | POA: Diagnosis not present

## 2017-12-25 DIAGNOSIS — M25579 Pain in unspecified ankle and joints of unspecified foot: Secondary | ICD-10-CM | POA: Diagnosis not present

## 2017-12-25 DIAGNOSIS — I2510 Atherosclerotic heart disease of native coronary artery without angina pectoris: Secondary | ICD-10-CM | POA: Diagnosis not present

## 2017-12-25 DIAGNOSIS — G8929 Other chronic pain: Secondary | ICD-10-CM | POA: Diagnosis not present

## 2017-12-25 DIAGNOSIS — I509 Heart failure, unspecified: Secondary | ICD-10-CM | POA: Diagnosis not present

## 2017-12-25 DIAGNOSIS — I10 Essential (primary) hypertension: Secondary | ICD-10-CM | POA: Diagnosis not present

## 2017-12-25 DIAGNOSIS — F0390 Unspecified dementia without behavioral disturbance: Secondary | ICD-10-CM | POA: Diagnosis not present

## 2017-12-25 NOTE — Telephone Encounter (Signed)
Attempted to confirm remote transmission with pt. No answer and was unable to leave a message. Automated message stating that the number is disconnected.

## 2017-12-26 DIAGNOSIS — J302 Other seasonal allergic rhinitis: Secondary | ICD-10-CM | POA: Diagnosis not present

## 2017-12-26 DIAGNOSIS — F0390 Unspecified dementia without behavioral disturbance: Secondary | ICD-10-CM | POA: Diagnosis not present

## 2017-12-26 DIAGNOSIS — G8929 Other chronic pain: Secondary | ICD-10-CM | POA: Diagnosis not present

## 2017-12-26 DIAGNOSIS — M6281 Muscle weakness (generalized): Secondary | ICD-10-CM | POA: Diagnosis not present

## 2017-12-26 DIAGNOSIS — J449 Chronic obstructive pulmonary disease, unspecified: Secondary | ICD-10-CM | POA: Diagnosis not present

## 2017-12-26 DIAGNOSIS — I10 Essential (primary) hypertension: Secondary | ICD-10-CM | POA: Diagnosis not present

## 2017-12-26 DIAGNOSIS — E039 Hypothyroidism, unspecified: Secondary | ICD-10-CM | POA: Diagnosis not present

## 2017-12-26 DIAGNOSIS — E876 Hypokalemia: Secondary | ICD-10-CM | POA: Diagnosis not present

## 2017-12-26 DIAGNOSIS — K219 Gastro-esophageal reflux disease without esophagitis: Secondary | ICD-10-CM | POA: Diagnosis not present

## 2017-12-26 DIAGNOSIS — E568 Deficiency of other vitamins: Secondary | ICD-10-CM | POA: Diagnosis not present

## 2017-12-26 DIAGNOSIS — F329 Major depressive disorder, single episode, unspecified: Secondary | ICD-10-CM | POA: Diagnosis not present

## 2017-12-27 ENCOUNTER — Encounter: Payer: Self-pay | Admitting: Cardiology

## 2018-01-02 DIAGNOSIS — F331 Major depressive disorder, recurrent, moderate: Secondary | ICD-10-CM | POA: Diagnosis not present

## 2018-01-02 DIAGNOSIS — G301 Alzheimer's disease with late onset: Secondary | ICD-10-CM | POA: Diagnosis not present

## 2018-01-02 DIAGNOSIS — F0280 Dementia in other diseases classified elsewhere without behavioral disturbance: Secondary | ICD-10-CM | POA: Diagnosis not present

## 2018-01-07 DIAGNOSIS — I10 Essential (primary) hypertension: Secondary | ICD-10-CM | POA: Diagnosis not present

## 2018-01-07 DIAGNOSIS — E039 Hypothyroidism, unspecified: Secondary | ICD-10-CM | POA: Diagnosis not present

## 2018-01-07 DIAGNOSIS — D649 Anemia, unspecified: Secondary | ICD-10-CM | POA: Diagnosis not present

## 2018-01-10 DIAGNOSIS — E039 Hypothyroidism, unspecified: Secondary | ICD-10-CM | POA: Diagnosis not present

## 2018-01-17 DIAGNOSIS — R5383 Other fatigue: Secondary | ICD-10-CM | POA: Diagnosis not present

## 2018-01-17 DIAGNOSIS — E039 Hypothyroidism, unspecified: Secondary | ICD-10-CM | POA: Diagnosis not present

## 2018-01-24 DIAGNOSIS — F0390 Unspecified dementia without behavioral disturbance: Secondary | ICD-10-CM | POA: Diagnosis not present

## 2018-01-24 DIAGNOSIS — K219 Gastro-esophageal reflux disease without esophagitis: Secondary | ICD-10-CM | POA: Diagnosis not present

## 2018-01-24 DIAGNOSIS — J449 Chronic obstructive pulmonary disease, unspecified: Secondary | ICD-10-CM | POA: Diagnosis not present

## 2018-01-24 DIAGNOSIS — F329 Major depressive disorder, single episode, unspecified: Secondary | ICD-10-CM | POA: Diagnosis not present

## 2018-01-24 DIAGNOSIS — E039 Hypothyroidism, unspecified: Secondary | ICD-10-CM | POA: Diagnosis not present

## 2018-01-24 DIAGNOSIS — J302 Other seasonal allergic rhinitis: Secondary | ICD-10-CM | POA: Diagnosis not present

## 2018-01-24 DIAGNOSIS — M6281 Muscle weakness (generalized): Secondary | ICD-10-CM | POA: Diagnosis not present

## 2018-01-24 DIAGNOSIS — G8929 Other chronic pain: Secondary | ICD-10-CM | POA: Diagnosis not present

## 2018-01-24 DIAGNOSIS — E568 Deficiency of other vitamins: Secondary | ICD-10-CM | POA: Diagnosis not present

## 2018-01-24 DIAGNOSIS — E876 Hypokalemia: Secondary | ICD-10-CM | POA: Diagnosis not present

## 2018-01-24 DIAGNOSIS — I10 Essential (primary) hypertension: Secondary | ICD-10-CM | POA: Diagnosis not present

## 2018-01-25 DIAGNOSIS — B351 Tinea unguium: Secondary | ICD-10-CM | POA: Diagnosis not present

## 2018-01-25 DIAGNOSIS — I739 Peripheral vascular disease, unspecified: Secondary | ICD-10-CM | POA: Diagnosis not present

## 2018-01-25 DIAGNOSIS — L603 Nail dystrophy: Secondary | ICD-10-CM | POA: Diagnosis not present

## 2018-01-25 DIAGNOSIS — Q845 Enlarged and hypertrophic nails: Secondary | ICD-10-CM | POA: Diagnosis not present

## 2018-01-30 DIAGNOSIS — R4182 Altered mental status, unspecified: Secondary | ICD-10-CM | POA: Diagnosis not present

## 2018-01-30 DIAGNOSIS — F331 Major depressive disorder, recurrent, moderate: Secondary | ICD-10-CM | POA: Diagnosis not present

## 2018-01-30 DIAGNOSIS — M6281 Muscle weakness (generalized): Secondary | ICD-10-CM | POA: Diagnosis not present

## 2018-01-30 DIAGNOSIS — F0280 Dementia in other diseases classified elsewhere without behavioral disturbance: Secondary | ICD-10-CM | POA: Diagnosis not present

## 2018-01-30 DIAGNOSIS — R5383 Other fatigue: Secondary | ICD-10-CM | POA: Diagnosis not present

## 2018-01-30 DIAGNOSIS — G301 Alzheimer's disease with late onset: Secondary | ICD-10-CM | POA: Diagnosis not present

## 2018-02-13 DIAGNOSIS — F0280 Dementia in other diseases classified elsewhere without behavioral disturbance: Secondary | ICD-10-CM | POA: Diagnosis not present

## 2018-02-13 DIAGNOSIS — G301 Alzheimer's disease with late onset: Secondary | ICD-10-CM | POA: Diagnosis not present

## 2018-02-13 DIAGNOSIS — F331 Major depressive disorder, recurrent, moderate: Secondary | ICD-10-CM | POA: Diagnosis not present

## 2018-02-14 ENCOUNTER — Ambulatory Visit: Payer: Medicare Other | Admitting: *Deleted

## 2018-02-14 NOTE — Progress Notes (Signed)
Remote ICD transmission.   

## 2018-02-14 NOTE — Progress Notes (Signed)
No transmittion   

## 2018-02-15 ENCOUNTER — Encounter: Payer: Self-pay | Admitting: Cardiology

## 2018-02-25 DIAGNOSIS — J302 Other seasonal allergic rhinitis: Secondary | ICD-10-CM | POA: Diagnosis not present

## 2018-02-25 DIAGNOSIS — I10 Essential (primary) hypertension: Secondary | ICD-10-CM | POA: Diagnosis not present

## 2018-02-25 DIAGNOSIS — G8929 Other chronic pain: Secondary | ICD-10-CM | POA: Diagnosis not present

## 2018-02-25 DIAGNOSIS — E039 Hypothyroidism, unspecified: Secondary | ICD-10-CM | POA: Diagnosis not present

## 2018-02-25 DIAGNOSIS — E876 Hypokalemia: Secondary | ICD-10-CM | POA: Diagnosis not present

## 2018-02-25 DIAGNOSIS — F0390 Unspecified dementia without behavioral disturbance: Secondary | ICD-10-CM | POA: Diagnosis not present

## 2018-02-25 DIAGNOSIS — M6281 Muscle weakness (generalized): Secondary | ICD-10-CM | POA: Diagnosis not present

## 2018-02-25 DIAGNOSIS — E568 Deficiency of other vitamins: Secondary | ICD-10-CM | POA: Diagnosis not present

## 2018-02-25 DIAGNOSIS — J449 Chronic obstructive pulmonary disease, unspecified: Secondary | ICD-10-CM | POA: Diagnosis not present

## 2018-02-25 DIAGNOSIS — K219 Gastro-esophageal reflux disease without esophagitis: Secondary | ICD-10-CM | POA: Diagnosis not present

## 2018-02-25 DIAGNOSIS — F329 Major depressive disorder, single episode, unspecified: Secondary | ICD-10-CM | POA: Diagnosis not present

## 2018-02-26 DIAGNOSIS — W06XXXA Fall from bed, initial encounter: Secondary | ICD-10-CM | POA: Diagnosis not present

## 2018-02-26 DIAGNOSIS — G8929 Other chronic pain: Secondary | ICD-10-CM | POA: Diagnosis not present

## 2018-02-26 DIAGNOSIS — I10 Essential (primary) hypertension: Secondary | ICD-10-CM | POA: Diagnosis not present

## 2018-02-26 DIAGNOSIS — F0390 Unspecified dementia without behavioral disturbance: Secondary | ICD-10-CM | POA: Diagnosis not present

## 2018-02-26 DIAGNOSIS — M6281 Muscle weakness (generalized): Secondary | ICD-10-CM | POA: Diagnosis not present

## 2018-02-26 DIAGNOSIS — K219 Gastro-esophageal reflux disease without esophagitis: Secondary | ICD-10-CM | POA: Diagnosis not present

## 2018-02-26 DIAGNOSIS — E039 Hypothyroidism, unspecified: Secondary | ICD-10-CM | POA: Diagnosis not present

## 2018-02-26 DIAGNOSIS — R634 Abnormal weight loss: Secondary | ICD-10-CM | POA: Diagnosis not present

## 2018-02-26 DIAGNOSIS — J449 Chronic obstructive pulmonary disease, unspecified: Secondary | ICD-10-CM | POA: Diagnosis not present

## 2018-02-26 DIAGNOSIS — F329 Major depressive disorder, single episode, unspecified: Secondary | ICD-10-CM | POA: Diagnosis not present

## 2018-02-26 DIAGNOSIS — J302 Other seasonal allergic rhinitis: Secondary | ICD-10-CM | POA: Diagnosis not present

## 2018-02-27 DIAGNOSIS — F0280 Dementia in other diseases classified elsewhere without behavioral disturbance: Secondary | ICD-10-CM | POA: Diagnosis not present

## 2018-02-27 DIAGNOSIS — Z043 Encounter for examination and observation following other accident: Secondary | ICD-10-CM | POA: Diagnosis not present

## 2018-02-27 DIAGNOSIS — G301 Alzheimer's disease with late onset: Secondary | ICD-10-CM | POA: Diagnosis not present

## 2018-02-27 DIAGNOSIS — F331 Major depressive disorder, recurrent, moderate: Secondary | ICD-10-CM | POA: Diagnosis not present

## 2018-02-27 DIAGNOSIS — W19XXXA Unspecified fall, initial encounter: Secondary | ICD-10-CM | POA: Diagnosis not present

## 2018-03-04 DIAGNOSIS — E039 Hypothyroidism, unspecified: Secondary | ICD-10-CM | POA: Diagnosis not present

## 2018-03-05 ENCOUNTER — Encounter: Payer: Self-pay | Admitting: Cardiology

## 2018-03-12 ENCOUNTER — Encounter: Payer: Self-pay | Admitting: Cardiology

## 2018-03-13 DIAGNOSIS — F0280 Dementia in other diseases classified elsewhere without behavioral disturbance: Secondary | ICD-10-CM | POA: Diagnosis not present

## 2018-03-13 DIAGNOSIS — F331 Major depressive disorder, recurrent, moderate: Secondary | ICD-10-CM | POA: Diagnosis not present

## 2018-03-13 DIAGNOSIS — G301 Alzheimer's disease with late onset: Secondary | ICD-10-CM | POA: Diagnosis not present

## 2018-03-20 ENCOUNTER — Telehealth: Payer: Self-pay | Admitting: Nurse Practitioner

## 2018-03-20 ENCOUNTER — Encounter: Payer: Medicare Other | Admitting: Nurse Practitioner

## 2018-03-20 NOTE — Telephone Encounter (Signed)
Patient was on the schedule to see Amber this am and we were not aware that her schedule had been cancelled.  Please call Britta Mccreedy at [phone] to reschedule this appointment.  thanks

## 2018-04-03 DIAGNOSIS — I739 Peripheral vascular disease, unspecified: Secondary | ICD-10-CM | POA: Diagnosis not present

## 2018-04-03 DIAGNOSIS — Q845 Enlarged and hypertrophic nails: Secondary | ICD-10-CM | POA: Diagnosis not present

## 2018-04-03 DIAGNOSIS — B351 Tinea unguium: Secondary | ICD-10-CM | POA: Diagnosis not present

## 2018-04-03 DIAGNOSIS — L603 Nail dystrophy: Secondary | ICD-10-CM | POA: Diagnosis not present

## 2018-04-03 DIAGNOSIS — F0280 Dementia in other diseases classified elsewhere without behavioral disturbance: Secondary | ICD-10-CM | POA: Diagnosis not present

## 2018-04-03 DIAGNOSIS — G301 Alzheimer's disease with late onset: Secondary | ICD-10-CM | POA: Diagnosis not present

## 2018-04-03 DIAGNOSIS — F331 Major depressive disorder, recurrent, moderate: Secondary | ICD-10-CM | POA: Diagnosis not present

## 2018-04-05 DIAGNOSIS — R634 Abnormal weight loss: Secondary | ICD-10-CM | POA: Diagnosis not present

## 2018-04-05 DIAGNOSIS — E039 Hypothyroidism, unspecified: Secondary | ICD-10-CM | POA: Diagnosis not present

## 2018-04-05 DIAGNOSIS — J302 Other seasonal allergic rhinitis: Secondary | ICD-10-CM | POA: Diagnosis not present

## 2018-04-05 DIAGNOSIS — E876 Hypokalemia: Secondary | ICD-10-CM | POA: Diagnosis not present

## 2018-04-05 DIAGNOSIS — G8929 Other chronic pain: Secondary | ICD-10-CM | POA: Diagnosis not present

## 2018-04-05 DIAGNOSIS — J449 Chronic obstructive pulmonary disease, unspecified: Secondary | ICD-10-CM | POA: Diagnosis not present

## 2018-04-05 DIAGNOSIS — E568 Deficiency of other vitamins: Secondary | ICD-10-CM | POA: Diagnosis not present

## 2018-04-05 DIAGNOSIS — W06XXXA Fall from bed, initial encounter: Secondary | ICD-10-CM | POA: Diagnosis not present

## 2018-04-05 DIAGNOSIS — F329 Major depressive disorder, single episode, unspecified: Secondary | ICD-10-CM | POA: Diagnosis not present

## 2018-04-05 DIAGNOSIS — M6281 Muscle weakness (generalized): Secondary | ICD-10-CM | POA: Diagnosis not present

## 2018-04-05 DIAGNOSIS — I10 Essential (primary) hypertension: Secondary | ICD-10-CM | POA: Diagnosis not present

## 2018-04-05 DIAGNOSIS — K219 Gastro-esophageal reflux disease without esophagitis: Secondary | ICD-10-CM | POA: Diagnosis not present

## 2018-05-01 DIAGNOSIS — G8929 Other chronic pain: Secondary | ICD-10-CM | POA: Diagnosis not present

## 2018-05-01 DIAGNOSIS — K219 Gastro-esophageal reflux disease without esophagitis: Secondary | ICD-10-CM | POA: Diagnosis not present

## 2018-05-01 DIAGNOSIS — F329 Major depressive disorder, single episode, unspecified: Secondary | ICD-10-CM | POA: Diagnosis not present

## 2018-05-01 DIAGNOSIS — I10 Essential (primary) hypertension: Secondary | ICD-10-CM | POA: Diagnosis not present

## 2018-05-01 DIAGNOSIS — M6281 Muscle weakness (generalized): Secondary | ICD-10-CM | POA: Diagnosis not present

## 2018-05-01 DIAGNOSIS — J302 Other seasonal allergic rhinitis: Secondary | ICD-10-CM | POA: Diagnosis not present

## 2018-05-01 DIAGNOSIS — E039 Hypothyroidism, unspecified: Secondary | ICD-10-CM | POA: Diagnosis not present

## 2018-05-01 DIAGNOSIS — F0390 Unspecified dementia without behavioral disturbance: Secondary | ICD-10-CM | POA: Diagnosis not present

## 2018-05-01 DIAGNOSIS — E568 Deficiency of other vitamins: Secondary | ICD-10-CM | POA: Diagnosis not present

## 2018-05-01 DIAGNOSIS — E876 Hypokalemia: Secondary | ICD-10-CM | POA: Diagnosis not present

## 2018-05-01 DIAGNOSIS — J449 Chronic obstructive pulmonary disease, unspecified: Secondary | ICD-10-CM | POA: Diagnosis not present

## 2018-05-06 DIAGNOSIS — E568 Deficiency of other vitamins: Secondary | ICD-10-CM | POA: Diagnosis not present

## 2018-05-06 DIAGNOSIS — F329 Major depressive disorder, single episode, unspecified: Secondary | ICD-10-CM | POA: Diagnosis not present

## 2018-05-06 DIAGNOSIS — I10 Essential (primary) hypertension: Secondary | ICD-10-CM | POA: Diagnosis not present

## 2018-05-06 DIAGNOSIS — K219 Gastro-esophageal reflux disease without esophagitis: Secondary | ICD-10-CM | POA: Diagnosis not present

## 2018-05-06 DIAGNOSIS — N181 Chronic kidney disease, stage 1: Secondary | ICD-10-CM | POA: Diagnosis not present

## 2018-05-06 DIAGNOSIS — J449 Chronic obstructive pulmonary disease, unspecified: Secondary | ICD-10-CM | POA: Diagnosis not present

## 2018-05-06 DIAGNOSIS — E039 Hypothyroidism, unspecified: Secondary | ICD-10-CM | POA: Diagnosis not present

## 2018-05-06 DIAGNOSIS — M6281 Muscle weakness (generalized): Secondary | ICD-10-CM | POA: Diagnosis not present

## 2018-05-06 DIAGNOSIS — G8929 Other chronic pain: Secondary | ICD-10-CM | POA: Diagnosis not present

## 2018-05-06 DIAGNOSIS — J302 Other seasonal allergic rhinitis: Secondary | ICD-10-CM | POA: Diagnosis not present

## 2018-05-06 DIAGNOSIS — F0390 Unspecified dementia without behavioral disturbance: Secondary | ICD-10-CM | POA: Diagnosis not present

## 2018-05-06 DIAGNOSIS — E876 Hypokalemia: Secondary | ICD-10-CM | POA: Diagnosis not present

## 2018-05-15 DIAGNOSIS — F331 Major depressive disorder, recurrent, moderate: Secondary | ICD-10-CM | POA: Diagnosis not present

## 2018-05-15 DIAGNOSIS — G301 Alzheimer's disease with late onset: Secondary | ICD-10-CM | POA: Diagnosis not present

## 2018-05-15 DIAGNOSIS — F0280 Dementia in other diseases classified elsewhere without behavioral disturbance: Secondary | ICD-10-CM | POA: Diagnosis not present

## 2018-05-16 ENCOUNTER — Encounter: Payer: Medicare Other | Admitting: *Deleted

## 2018-05-17 ENCOUNTER — Encounter: Payer: Self-pay | Admitting: Cardiology

## 2018-05-17 ENCOUNTER — Telehealth: Payer: Self-pay

## 2018-05-17 NOTE — Telephone Encounter (Signed)
Pt is now living in a nursing home. The facility do not know if she came with her home monitoring device. They will call us and let us know so they can do the manual transmission for the patient.

## 2018-05-20 DIAGNOSIS — Z79899 Other long term (current) drug therapy: Secondary | ICD-10-CM | POA: Diagnosis not present

## 2018-05-20 DIAGNOSIS — E039 Hypothyroidism, unspecified: Secondary | ICD-10-CM | POA: Diagnosis not present

## 2018-05-20 DIAGNOSIS — D649 Anemia, unspecified: Secondary | ICD-10-CM | POA: Diagnosis not present

## 2018-05-22 DIAGNOSIS — I10 Essential (primary) hypertension: Secondary | ICD-10-CM | POA: Diagnosis not present

## 2018-05-25 DIAGNOSIS — J449 Chronic obstructive pulmonary disease, unspecified: Secondary | ICD-10-CM | POA: Diagnosis not present

## 2018-05-25 DIAGNOSIS — I10 Essential (primary) hypertension: Secondary | ICD-10-CM | POA: Diagnosis not present

## 2018-06-05 DIAGNOSIS — Q845 Enlarged and hypertrophic nails: Secondary | ICD-10-CM | POA: Diagnosis not present

## 2018-06-05 DIAGNOSIS — B351 Tinea unguium: Secondary | ICD-10-CM | POA: Diagnosis not present

## 2018-06-05 DIAGNOSIS — I739 Peripheral vascular disease, unspecified: Secondary | ICD-10-CM | POA: Diagnosis not present

## 2018-06-05 DIAGNOSIS — L603 Nail dystrophy: Secondary | ICD-10-CM | POA: Diagnosis not present

## 2018-06-06 DIAGNOSIS — R41841 Cognitive communication deficit: Secondary | ICD-10-CM | POA: Diagnosis not present

## 2018-06-06 DIAGNOSIS — G8929 Other chronic pain: Secondary | ICD-10-CM | POA: Diagnosis not present

## 2018-06-06 DIAGNOSIS — M6281 Muscle weakness (generalized): Secondary | ICD-10-CM | POA: Diagnosis not present

## 2018-06-06 DIAGNOSIS — F0390 Unspecified dementia without behavioral disturbance: Secondary | ICD-10-CM | POA: Diagnosis not present

## 2018-06-06 DIAGNOSIS — R262 Difficulty in walking, not elsewhere classified: Secondary | ICD-10-CM | POA: Diagnosis not present

## 2018-06-07 DIAGNOSIS — R262 Difficulty in walking, not elsewhere classified: Secondary | ICD-10-CM | POA: Diagnosis not present

## 2018-06-07 DIAGNOSIS — M6281 Muscle weakness (generalized): Secondary | ICD-10-CM | POA: Diagnosis not present

## 2018-06-07 DIAGNOSIS — R41841 Cognitive communication deficit: Secondary | ICD-10-CM | POA: Diagnosis not present

## 2018-06-07 DIAGNOSIS — G8929 Other chronic pain: Secondary | ICD-10-CM | POA: Diagnosis not present

## 2018-06-07 DIAGNOSIS — F0390 Unspecified dementia without behavioral disturbance: Secondary | ICD-10-CM | POA: Diagnosis not present

## 2018-06-10 DIAGNOSIS — R41841 Cognitive communication deficit: Secondary | ICD-10-CM | POA: Diagnosis not present

## 2018-06-10 DIAGNOSIS — G8929 Other chronic pain: Secondary | ICD-10-CM | POA: Diagnosis not present

## 2018-06-10 DIAGNOSIS — R262 Difficulty in walking, not elsewhere classified: Secondary | ICD-10-CM | POA: Diagnosis not present

## 2018-06-10 DIAGNOSIS — F0390 Unspecified dementia without behavioral disturbance: Secondary | ICD-10-CM | POA: Diagnosis not present

## 2018-06-10 DIAGNOSIS — M6281 Muscle weakness (generalized): Secondary | ICD-10-CM | POA: Diagnosis not present

## 2018-06-11 DIAGNOSIS — R262 Difficulty in walking, not elsewhere classified: Secondary | ICD-10-CM | POA: Diagnosis not present

## 2018-06-11 DIAGNOSIS — F0390 Unspecified dementia without behavioral disturbance: Secondary | ICD-10-CM | POA: Diagnosis not present

## 2018-06-11 DIAGNOSIS — G8929 Other chronic pain: Secondary | ICD-10-CM | POA: Diagnosis not present

## 2018-06-11 DIAGNOSIS — R41841 Cognitive communication deficit: Secondary | ICD-10-CM | POA: Diagnosis not present

## 2018-06-11 DIAGNOSIS — M6281 Muscle weakness (generalized): Secondary | ICD-10-CM | POA: Diagnosis not present

## 2018-06-12 DIAGNOSIS — M6281 Muscle weakness (generalized): Secondary | ICD-10-CM | POA: Diagnosis not present

## 2018-06-12 DIAGNOSIS — R262 Difficulty in walking, not elsewhere classified: Secondary | ICD-10-CM | POA: Diagnosis not present

## 2018-06-12 DIAGNOSIS — F0280 Dementia in other diseases classified elsewhere without behavioral disturbance: Secondary | ICD-10-CM | POA: Diagnosis not present

## 2018-06-12 DIAGNOSIS — G8929 Other chronic pain: Secondary | ICD-10-CM | POA: Diagnosis not present

## 2018-06-12 DIAGNOSIS — G301 Alzheimer's disease with late onset: Secondary | ICD-10-CM | POA: Diagnosis not present

## 2018-06-12 DIAGNOSIS — F0390 Unspecified dementia without behavioral disturbance: Secondary | ICD-10-CM | POA: Diagnosis not present

## 2018-06-12 DIAGNOSIS — R41841 Cognitive communication deficit: Secondary | ICD-10-CM | POA: Diagnosis not present

## 2018-06-12 DIAGNOSIS — F331 Major depressive disorder, recurrent, moderate: Secondary | ICD-10-CM | POA: Diagnosis not present

## 2018-06-13 DIAGNOSIS — R262 Difficulty in walking, not elsewhere classified: Secondary | ICD-10-CM | POA: Diagnosis not present

## 2018-06-13 DIAGNOSIS — G8929 Other chronic pain: Secondary | ICD-10-CM | POA: Diagnosis not present

## 2018-06-13 DIAGNOSIS — M1611 Unilateral primary osteoarthritis, right hip: Secondary | ICD-10-CM | POA: Diagnosis not present

## 2018-06-13 DIAGNOSIS — R41841 Cognitive communication deficit: Secondary | ICD-10-CM | POA: Diagnosis not present

## 2018-06-13 DIAGNOSIS — F0390 Unspecified dementia without behavioral disturbance: Secondary | ICD-10-CM | POA: Diagnosis not present

## 2018-06-13 DIAGNOSIS — M6281 Muscle weakness (generalized): Secondary | ICD-10-CM | POA: Diagnosis not present

## 2018-06-13 DIAGNOSIS — M47816 Spondylosis without myelopathy or radiculopathy, lumbar region: Secondary | ICD-10-CM | POA: Diagnosis not present

## 2018-06-14 DIAGNOSIS — E568 Deficiency of other vitamins: Secondary | ICD-10-CM | POA: Diagnosis not present

## 2018-06-14 DIAGNOSIS — I10 Essential (primary) hypertension: Secondary | ICD-10-CM | POA: Diagnosis not present

## 2018-06-14 DIAGNOSIS — F0390 Unspecified dementia without behavioral disturbance: Secondary | ICD-10-CM | POA: Diagnosis not present

## 2018-06-14 DIAGNOSIS — J302 Other seasonal allergic rhinitis: Secondary | ICD-10-CM | POA: Diagnosis not present

## 2018-06-14 DIAGNOSIS — M6281 Muscle weakness (generalized): Secondary | ICD-10-CM | POA: Diagnosis not present

## 2018-06-14 DIAGNOSIS — E039 Hypothyroidism, unspecified: Secondary | ICD-10-CM | POA: Diagnosis not present

## 2018-06-14 DIAGNOSIS — E876 Hypokalemia: Secondary | ICD-10-CM | POA: Diagnosis not present

## 2018-06-14 DIAGNOSIS — K219 Gastro-esophageal reflux disease without esophagitis: Secondary | ICD-10-CM | POA: Diagnosis not present

## 2018-06-14 DIAGNOSIS — N181 Chronic kidney disease, stage 1: Secondary | ICD-10-CM | POA: Diagnosis not present

## 2018-06-14 DIAGNOSIS — R262 Difficulty in walking, not elsewhere classified: Secondary | ICD-10-CM | POA: Diagnosis not present

## 2018-06-14 DIAGNOSIS — J449 Chronic obstructive pulmonary disease, unspecified: Secondary | ICD-10-CM | POA: Diagnosis not present

## 2018-06-14 DIAGNOSIS — G8929 Other chronic pain: Secondary | ICD-10-CM | POA: Diagnosis not present

## 2018-06-14 DIAGNOSIS — F329 Major depressive disorder, single episode, unspecified: Secondary | ICD-10-CM | POA: Diagnosis not present

## 2018-06-14 DIAGNOSIS — R41841 Cognitive communication deficit: Secondary | ICD-10-CM | POA: Diagnosis not present

## 2018-06-17 DIAGNOSIS — F0390 Unspecified dementia without behavioral disturbance: Secondary | ICD-10-CM | POA: Diagnosis not present

## 2018-06-17 DIAGNOSIS — M545 Low back pain: Secondary | ICD-10-CM | POA: Diagnosis not present

## 2018-06-17 DIAGNOSIS — G8929 Other chronic pain: Secondary | ICD-10-CM | POA: Diagnosis not present

## 2018-06-17 DIAGNOSIS — R262 Difficulty in walking, not elsewhere classified: Secondary | ICD-10-CM | POA: Diagnosis not present

## 2018-06-17 DIAGNOSIS — M6281 Muscle weakness (generalized): Secondary | ICD-10-CM | POA: Diagnosis not present

## 2018-06-17 DIAGNOSIS — R41841 Cognitive communication deficit: Secondary | ICD-10-CM | POA: Diagnosis not present

## 2018-06-18 DIAGNOSIS — R262 Difficulty in walking, not elsewhere classified: Secondary | ICD-10-CM | POA: Diagnosis not present

## 2018-06-18 DIAGNOSIS — M6281 Muscle weakness (generalized): Secondary | ICD-10-CM | POA: Diagnosis not present

## 2018-06-18 DIAGNOSIS — F0390 Unspecified dementia without behavioral disturbance: Secondary | ICD-10-CM | POA: Diagnosis not present

## 2018-06-18 DIAGNOSIS — G8929 Other chronic pain: Secondary | ICD-10-CM | POA: Diagnosis not present

## 2018-06-18 DIAGNOSIS — R41841 Cognitive communication deficit: Secondary | ICD-10-CM | POA: Diagnosis not present

## 2018-06-19 DIAGNOSIS — F0390 Unspecified dementia without behavioral disturbance: Secondary | ICD-10-CM | POA: Diagnosis not present

## 2018-06-19 DIAGNOSIS — R102 Pelvic and perineal pain: Secondary | ICD-10-CM | POA: Diagnosis not present

## 2018-06-19 DIAGNOSIS — R82998 Other abnormal findings in urine: Secondary | ICD-10-CM | POA: Diagnosis not present

## 2018-06-19 DIAGNOSIS — R41841 Cognitive communication deficit: Secondary | ICD-10-CM | POA: Diagnosis not present

## 2018-06-19 DIAGNOSIS — G8929 Other chronic pain: Secondary | ICD-10-CM | POA: Diagnosis not present

## 2018-06-19 DIAGNOSIS — R262 Difficulty in walking, not elsewhere classified: Secondary | ICD-10-CM | POA: Diagnosis not present

## 2018-06-19 DIAGNOSIS — G894 Chronic pain syndrome: Secondary | ICD-10-CM | POA: Diagnosis not present

## 2018-06-19 DIAGNOSIS — M545 Low back pain: Secondary | ICD-10-CM | POA: Diagnosis not present

## 2018-06-19 DIAGNOSIS — M6281 Muscle weakness (generalized): Secondary | ICD-10-CM | POA: Diagnosis not present

## 2018-06-20 ENCOUNTER — Encounter (HOSPITAL_COMMUNITY): Payer: Self-pay | Admitting: Emergency Medicine

## 2018-06-20 ENCOUNTER — Other Ambulatory Visit: Payer: Self-pay

## 2018-06-20 ENCOUNTER — Emergency Department (HOSPITAL_COMMUNITY)
Admission: EM | Admit: 2018-06-20 | Discharge: 2018-06-20 | Disposition: A | Discharge status: Nursing Home (Includes ICF) | Payer: Medicare Other | Attending: Emergency Medicine | Admitting: Emergency Medicine

## 2018-06-20 ENCOUNTER — Emergency Department (HOSPITAL_COMMUNITY): Payer: Medicare Other

## 2018-06-20 DIAGNOSIS — R41841 Cognitive communication deficit: Secondary | ICD-10-CM | POA: Diagnosis not present

## 2018-06-20 DIAGNOSIS — S32010A Wedge compression fracture of first lumbar vertebra, initial encounter for closed fracture: Secondary | ICD-10-CM | POA: Diagnosis not present

## 2018-06-20 DIAGNOSIS — W1830XA Fall on same level, unspecified, initial encounter: Secondary | ICD-10-CM | POA: Diagnosis not present

## 2018-06-20 DIAGNOSIS — W19XXXA Unspecified fall, initial encounter: Secondary | ICD-10-CM | POA: Diagnosis not present

## 2018-06-20 DIAGNOSIS — R279 Unspecified lack of coordination: Secondary | ICD-10-CM | POA: Diagnosis not present

## 2018-06-20 DIAGNOSIS — M542 Cervicalgia: Secondary | ICD-10-CM | POA: Diagnosis not present

## 2018-06-20 DIAGNOSIS — I10 Essential (primary) hypertension: Secondary | ICD-10-CM | POA: Diagnosis not present

## 2018-06-20 DIAGNOSIS — Z79899 Other long term (current) drug therapy: Secondary | ICD-10-CM | POA: Diagnosis not present

## 2018-06-20 DIAGNOSIS — M546 Pain in thoracic spine: Secondary | ICD-10-CM | POA: Diagnosis not present

## 2018-06-20 DIAGNOSIS — S32039A Unspecified fracture of third lumbar vertebra, initial encounter for closed fracture: Secondary | ICD-10-CM | POA: Diagnosis not present

## 2018-06-20 DIAGNOSIS — J449 Chronic obstructive pulmonary disease, unspecified: Secondary | ICD-10-CM | POA: Diagnosis not present

## 2018-06-20 DIAGNOSIS — R262 Difficulty in walking, not elsewhere classified: Secondary | ICD-10-CM | POA: Diagnosis not present

## 2018-06-20 DIAGNOSIS — R52 Pain, unspecified: Secondary | ICD-10-CM | POA: Diagnosis not present

## 2018-06-20 DIAGNOSIS — F0390 Unspecified dementia without behavioral disturbance: Secondary | ICD-10-CM | POA: Diagnosis not present

## 2018-06-20 DIAGNOSIS — I5022 Chronic systolic (congestive) heart failure: Secondary | ICD-10-CM | POA: Insufficient documentation

## 2018-06-20 DIAGNOSIS — M6281 Muscle weakness (generalized): Secondary | ICD-10-CM | POA: Diagnosis not present

## 2018-06-20 DIAGNOSIS — Y939 Activity, unspecified: Secondary | ICD-10-CM | POA: Diagnosis not present

## 2018-06-20 DIAGNOSIS — S299XXA Unspecified injury of thorax, initial encounter: Secondary | ICD-10-CM | POA: Diagnosis not present

## 2018-06-20 DIAGNOSIS — Y999 Unspecified external cause status: Secondary | ICD-10-CM | POA: Insufficient documentation

## 2018-06-20 DIAGNOSIS — D649 Anemia, unspecified: Secondary | ICD-10-CM | POA: Diagnosis not present

## 2018-06-20 DIAGNOSIS — G8929 Other chronic pain: Secondary | ICD-10-CM | POA: Diagnosis not present

## 2018-06-20 DIAGNOSIS — Y92129 Unspecified place in nursing home as the place of occurrence of the external cause: Secondary | ICD-10-CM | POA: Diagnosis not present

## 2018-06-20 DIAGNOSIS — Z743 Need for continuous supervision: Secondary | ICD-10-CM | POA: Diagnosis not present

## 2018-06-20 DIAGNOSIS — S199XXA Unspecified injury of neck, initial encounter: Secondary | ICD-10-CM | POA: Diagnosis not present

## 2018-06-20 DIAGNOSIS — M549 Dorsalgia, unspecified: Secondary | ICD-10-CM | POA: Diagnosis present

## 2018-06-20 DIAGNOSIS — M5489 Other dorsalgia: Secondary | ICD-10-CM | POA: Diagnosis not present

## 2018-06-20 MED ORDER — CLONIDINE HCL 0.1 MG PO TABS
0.1000 mg | ORAL_TABLET | Freq: Once | ORAL | Status: AC
  Administered 2018-06-20: 0.1 mg via ORAL
  Filled 2018-06-20: qty 1

## 2018-06-20 MED ORDER — ACETAMINOPHEN 325 MG PO TABS
650.0000 mg | ORAL_TABLET | Freq: Once | ORAL | Status: AC
  Administered 2018-06-20: 650 mg via ORAL
  Filled 2018-06-20: qty 2

## 2018-06-20 NOTE — ED Triage Notes (Signed)
Pt c/o bilateral flank pain. Pt denies problem with urination. Pt with fall 10 days ago and pain started after the fall.

## 2018-06-20 NOTE — Discharge Instructions (Addendum)
You have broken your L3 bone in your back. Please wear the brace we gave you when you are out of bed and walking around. This may be taken off for bathing.  Please follow-up with the neurosurgery group listed below for follow-up.  Thank you for allowing [place] to take care of you today.

## 2018-06-20 NOTE — ED Notes (Signed)
Patient found out of bed, redirected back in bed. Patient understands the need to use call bell if any assistance is needed. Will continue to round frequently to ensure patient safety.

## 2018-06-20 NOTE — ED Provider Notes (Signed)
[facility]-EMERGENCY DEPT Provider Note  CSN: [id number] Arrival date & time: 06/20/18  1436  History   Chief Complaint Chief Complaint  Patient presents with  . Flank Pain    HPI [name] is a 84 y.o. female with a medical history of cognitive impairment, HF, COPD, GERD and CAD who presented to the ED for back pain. Patient is unreliable historian, but she states that she has back pain and that she fell. Unable to state when the fall happened. Per report from EMS/nursing home, fall occurred 10 days ago. At baseline, patient is ambulatory. Unable to identify location of back pain, quality/severity, worsening/relieving factors or attempts at pain relief prior to ED arrival.  Past Medical History:  Diagnosis Date  . Anemia    takes iron 3 days per week  . Asthma   . Chronic systolic heart failure (HCC)    NYHA class II.  [name] COPD (chronic obstructive pulmonary disease) (HCC)   . DJD (degenerative joint disease), cervical   . DJD (degenerative joint disease), lumbar   . FH: mitral valve repair    with 26 mm Edwards ring angioplasty,   . Full dentures   . GERD (gastroesophageal reflux disease)   . HTN (hypertension)   . Hx of cardiovascular stress test    Lexiscan Myoview (9/15):  Normal stress nuclear study.  LV Ejection Fraction: 76%  . Hyperthyroidism    following Graves disease  . Ischemic cardiomyopathy    severe. Left ventricular ejection fraction 20%.   . Neuromuscular scoliosis of thoracolumbar region    type of scoliosis was not specified.   . Raynaud's syndrome   . Renal artery stenosis (HCC)    Treated with angioplast in 1980 and 1987.   . S/P CABG (coronary artery bypass graft) April 2012    Patient Active Problem List   Diagnosis Date Noted  . History of femur fracture 09/15/2016  . Acute pain of left knee 09/15/2016  . Sepsis (HCC) 07/09/2016  . UTI (urinary tract infection) 07/09/2016  . Acute encephalopathy 07/09/2016  . Acute  renal failure superimposed on stage 3 chronic kidney disease (HCC) 07/09/2016  . Hyperglycemia 07/09/2016  . Acute cystitis with hematuria   . AKI (acute kidney injury) (HCC)   . Nausea and vomiting 09/25/2015  . Mild dementia   . Hypertensive urgency 09/24/2015  . Eye swollen, left 11/12/2014  . Rash, right thigh 11/12/2014  . Femur fracture, left (HCC) 09/29/2014  . Hypothyroidism 09/29/2014  . Coronary atherosclerosis of native coronary artery 06/03/2014  . S/P mitral valve repair 06/03/2014  . Total knee replacement status 04/29/2014  . Dizziness 08/28/2013  . FH: mitral valve repair   . COPD (chronic obstructive pulmonary disease) (HCC)   . BBB (bundle branch block)   . Ischemic cardiomyopathy   . Automatic implantable cardioverter-defibrillator in situ 06/24/2012  . Preop respiratory exam 05/17/2012  . Chronic systolic heart failure (HCC) 03/01/2011  . Other specified forms of chronic ischemic heart disease 03/01/2011  . S/P CABG (coronary artery bypass graft) 12/25/2010  . SHORTNESS OF BREATH (SOB) 05/07/2009  . Hyperlipidemia 05/06/2009  . Essential hypertension 05/06/2009    Past Surgical History:  Procedure Laterality Date  . APPENDECTOMY    . BIV ICD GENERTAOR CHANGE OUT N/A 11/11/2014   Procedure: BIV ICD GENERTAOR CHANGE OUT;  Surgeon: Hillis Range, MD;  Location: [facility] CATH LAB;  Service: Cardiovascular;  Laterality: N/A;  . BREAST LUMPECTOMY     left breast  .  CARPOMETACARPEL SUSPENSION PLASTY Right 11/12/2013   Procedure: SUSPENSION PLASTY RIGHT THUMB, TRAPEZIUM EXCISION;  Surgeon: Nicki Reaper, MD;  Location: [facility];  Service: Orthopedics;  Laterality: Right;  . CATARACT EXTRACTION    . CERVICAL FUSION     C5-6 and C6-7, C4-5 with titanium plates  . CORONARY ARTERY BYPASS GRAFT  2010   mvr/cabg  . ESOPHAGEAL MANOMETRY N/A 04/07/2013   Procedure: ESOPHAGEAL MANOMETRY (EM);  Surgeon: Charolett Bumpers, MD;  Location: [facility] ENDOSCOPY;  Service:  Endoscopy;  Laterality: N/A;  . FACIAL COSMETIC SURGERY    . FEMUR IM NAIL Left 09/29/2014   Procedure: Affixus Trochanteric Femoral Nail;  Surgeon: Eldred Manges, MD;  Location: [facility] ORS;  Service: Orthopedics;  Laterality: Left;  . FINGER ARTHROPLASTY  07/10/2012   Procedure: FINGER ARTHROPLASTY;  Surgeon: Nicki Reaper, MD;  Location: [facility];  Service: Orthopedics;  Laterality: Right;  METACARPAL PHALANGEAL ARTHROPLASTIES RIGHT INDEX, MIDDLE, AND RING FINGERS   . HEMORRHOIDECTOMY WITH HEMORRHOID BANDING    . implantation of ICD  2010   BiV ICD implant (SJM) by Dr Amil Amen 03/2009  . laminotomy/foraminotomy     with decompression of the L4 nerve root   . lung mass removal Right   . precancerous growth     tops of ear removed. bilateral.   . PTCA     of bilateral renal arteries  . REPAIR EXTENSOR TENDON  07/10/2012   Procedure: REPAIR EXTENSOR TENDON;  Surgeon: Nicki Reaper, MD;  Location: [facility];  Service: Orthopedics;  Laterality: Right;  METACARPAL PHALANGEAL REPLACEMENT ARTHROPLASTIES RIGHT INDEX, MIDDLE, AND RING FINGERS    . ROTATOR CUFF REPAIR     right and left  . SYMPATHECTOMY    . TENDON TRANSFER Right 11/12/2013   Procedure: RIGHT ABDUCTOR POLLICUS LONGUS TENDON TRANSFER;  Surgeon: Nicki Reaper, MD;  Location: [facility];  Service: Orthopedics;  Laterality: Right;  . TONSILLECTOMY    . TOTAL ABDOMINAL HYSTERECTOMY    . TOTAL KNEE ARTHROPLASTY Left 04/29/2014   Procedure: LEFT TOTAL KNEE ARTHROPLASTY;  Surgeon: Nadara Mustard, MD;  Location: [facility];  Service: Orthopedics;  Laterality: Left;  . UMBILICAL HERNIA REPAIR    . WEDGE RESECTION  2001   for the right upper lobe for Aspergillus treatement.  Dr. Edwyna Shell apprix 2001.     OB History   None      Home Medications    Prior to Admission medications   Medication Sig Start Date End Date Taking? Authorizing Provider  carvedilol (COREG) 6.25 MG tablet Take 6.25 mg by mouth 2  (two) times daily with a meal.   Yes [provider]  cholecalciferol (VITAMIN D) 1000 units tablet Take 1,000 Units by mouth daily.   Yes [provider]  levothyroxine (SYNTHROID, LEVOTHROID) 88 MCG tablet Take 88 mcg by mouth daily before breakfast.   Yes [provider]  NAMZARIC 28-10 MG CP24 Take 1 capsule by mouth daily. 08/30/15  Yes [provider]  Probiotic Product (PROBIOTIC COLON SUPPORT PO) Take 1 tablet by mouth daily.   Yes [provider]  sertraline (ZOLOFT) 25 MG tablet Take 25 mg by mouth daily.   Yes [provider]  traMADol (ULTRAM) 50 MG tablet Take 50 mg by mouth every 12 (twelve) hours.   Yes [provider]  UNABLE TO FIND Med Pass 2.0- Drink 4 ounces by mouth two times a day   Yes [provider]  acetaminophen (TYLENOL) 500 MG tablet Take 500 mg by mouth 2 (two) times daily.    [provider]  carvedilol (COREG) 12.5 MG tablet Take 1 tablet (12.5 mg total) by mouth 2 (two) times daily. Patient not taking: Reported on 06/20/2018 04/01/14   Jake Bathe, MD  montelukast (SINGULAIR) 10 MG tablet Take 10 mg by mouth at bedtime.    [provider]    Family History Family History  Problem Relation Age of Onset  . Heart disease Father   . Hypertension Father   . Colon cancer Unknown        grandmother    Social History Social History   Tobacco Use  . Smoking status: Never Smoker  . Smokeless tobacco: Never Used  . Tobacco comment: passive smoker from birth to age 27 (mom and husband)  Substance Use Topics  . Alcohol use: No  . Drug use: No     Allergies   Alprazolam; Doxycycline; Fulvicin p-g [griseofulvin]; Hydrocodone; Ketoconazole; Serevent [salmeterol]; Calcitonin (salmon); Morphine and related; Amitriptyline; Cefuroxime axetil; Co q 10 [coenzyme q10]; Hydrocodone-acetaminophen; Lorcet [hydrocodone-acetaminophen]; Other; Percodan [oxycodone-aspirin]; Plendil  [felodipine]; Sulfamethoxazole-trimethoprim; Sulfonamide derivatives; Trovan [alatrofloxacin]; Ventolin [albuterol]; Aspirin; Cephalexin; Ciprofloxacin; Clonazepam; Erythromycin; Hydromorphone; Miacalcin [calcitonin (salmon)]; Oxycodone-aspirin; Penicillins; and Tapazole [methimazole]   Review of Systems Review of Systems  Unable to perform ROS: Dementia   Physical Exam Updated Vital Signs BP 126/88 (BP Location: Left Arm)   Pulse 64   Temp 98.1 F (36.7 C) (Oral)   Resp 20   SpO2 96%   Physical Exam  Constitutional:  Thin. Chronic ill appearance.  HENT:  Head: Normocephalic and atraumatic.  Eyes: Pupils are equal, round, and reactive to light. Conjunctivae and EOM are normal.  Neck: Normal range of motion and full passive range of motion without pain. Neck supple. No spinous process tenderness and no muscular tenderness present. Normal range of motion present.  Cardiovascular: Normal rate, regular rhythm and intact distal pulses.  Pulmonary/Chest: Effort normal and breath sounds normal.  Abdominal: Soft. Bowel sounds are normal. There is no tenderness.  Musculoskeletal:  ROM of upper and lower extremities intact with decreased strength. Endorses generalized tenderness of back bilaterally. No midline tenderness.  Neurological: No sensory deficit. She exhibits normal muscle tone. Gait normal.  Reflex Scores:      Patellar reflexes are 1+ on the right side and 1+ on the left side.      Achilles reflexes are 1+ on the right side and 1+ on the left side. Skin: Skin is warm and intact. Capillary refill takes less than 2 seconds.  Psychiatric: Cognition and memory are impaired.  Nursing note and vitals reviewed.  ED Treatments / Results  Labs (all labs ordered are listed, but only abnormal results are displayed) Labs Reviewed - No data to display  EKG None  Radiology Ct Cervical Spine Wo Contrast  Result Date: 06/20/2018 CLINICAL DATA:  Neck pain. Bilateral flank pain. Fall  10 days ago. Initial encounter. EXAM: CT CERVICAL SPINE WITHOUT CONTRAST TECHNIQUE: Multidetector CT imaging of the cervical spine was performed without intravenous contrast. Multiplanar CT image reconstructions were also generated. COMPARISON:  07/22/2013 FINDINGS: Alignment: Unchanged reversal of the normal cervical lordosis. Unchanged grade 1 anterolisthesis of C3 on C4. Skull base and vertebrae: Solid anterior fusion from C4-T1. Unchanged and unremarkable appearance of the C4-5 fusion plate and screws. Unchanged retraction of the C6 screws with mild surrounding lucency and with the plate being 8 mm anterior to the  anterior C6 vertebral body cortex. Posterior element ankylosis at C4-5, C5-6, and C7-T1. No acute fracture or suspicious osseous lesion. Chronic pannus formation about the dens with underlying cystic or erosive changes. Soft tissues and spinal canal: No prevertebral fluid or swelling. No visible canal hematoma. Disc levels: Anterolisthesis at C3-4 with severe facet arthrosis and uncovertebral spurring resulting in moderate bilateral neural foraminal stenosis and mild spinal stenosis, similar to prior. Upper chest: Mild biapical pleuroparenchymal scarring. Prior right upper lobe wedge resection. Other: None. IMPRESSION: 1. No evidence of acute fracture or traumatic subluxation in the cervical spine. 2. C4-T1 anterior fusion with unchanged C6 screw retraction. 3. Moderate bilateral neural foraminal stenosis and mild spinal stenosis at C3-4. Electronically Signed   By: Sebastian Ache M.D.   On: 06/20/2018 17:57   Ct Thoracic Spine Wo Contrast  Result Date: 06/20/2018 CLINICAL DATA:  Thoracic pain. Bilateral flank pain. Fall 10 days ago. Initial encounter. EXAM: CT THORACIC SPINE WITHOUT CONTRAST TECHNIQUE: Multidetector CT images of the thoracic were obtained using the standard protocol without intravenous contrast. COMPARISON:  Chest radiographs 07/09/2016. Chest CTA 05/14/2014. FINDINGS: Alignment:  Mild thoracic dextroscoliosis. No significant listhesis. Vertebrae: Anterior cervical fusion extending to T1. Chronic T12 compression fracture with mild vertebral body height loss, also present in 2015. Unchanged T3 and T10 superior endplate Schmorl's nodes. No acute fracture or suspicious osseous lesion. Paraspinal and other soft tissues: Aortic and coronary artery atherosclerosis. ICD. Mitral valve repair. Right upper lobe lung wedge resection. Calcified right lower lobe granuloma. Mild biapical pleuroparenchymal scarring. Disc levels: Severe disc space narrowing at T1-2 likely with partial interbody ankylosis. Severe disc space narrowing at T10-11. Multilevel facet spurring and ligamentous calcification resulting in up to mild spinal stenosis. Severe left neural foraminal stenosis at T10-11 due to disc bulging and facet spurring. IMPRESSION: 1. No acute osseous abnormality. 2. Chronic T12 compression. 3. Multilevel disc and facet degeneration. Electronically Signed   By: Sebastian Ache M.D.   On: 06/20/2018 17:45   Ct Lumbar Spine Wo Contrast  Result Date: 06/20/2018 CLINICAL DATA:  Bilateral flank pain. Low back pain. Fall 10 days ago. Initial encounter. EXAM: CT LUMBAR SPINE WITHOUT CONTRAST TECHNIQUE: Multidetector CT imaging of the lumbar spine was performed without intravenous contrast administration. Multiplanar CT image reconstructions were also generated. COMPARISON:  CT abdomen and pelvis 07/10/2016. Lumbar spine MRI 01/31/2006. FINDINGS: Segmentation: Transitional lumbosacral anatomy with partial sacralization of L5. Rudimentary ribs at T12. Alignment: Mild-to-moderate lumbar levoscoliosis. 3 mm anterolisthesis of L3 on L4. Vertebrae: Acute L3 superior endplate fracture with 15% vertebral body height loss. The fracture extends through the posterior vertebral body cortex with 3 mm retropulsion of bone. There is a chronic T12 compression fracture. No suspicious osseous lesion. Paraspinal and other soft  tissues: Extensive abdominal aortic atherosclerosis without aneurysm. Disc levels: Severe disc space narrowing at L4-5. Vacuum disc at L4-5 and L1-2. At L2-3, disc bulging, slight L3 retropulsion, and moderate posterior element hypertrophy result in moderate spinal stenosis and mild bilateral neural foraminal stenosis. Prior right laminectomy at L3-4. Moderate neural foraminal stenosis on the right at L3-4 and on the left at L4-5. Multilevel facet arthrosis, asymmetrically severe on the left at L3-4 and L4-5. IMPRESSION: 1. Acute L3 superior endplate fracture with 15% vertebral body height loss and 3 mm retropulsion. 2. Chronic T12 compression fracture. 3. Moderate spinal stenosis at L2-3. 4.  Aortic Atherosclerosis (ICD10-I70.0). Electronically Signed   By: Sebastian Ache M.D.   On: 06/20/2018 18:06    Procedures Procedures (  including critical care time)  Medications Ordered in ED Medications  acetaminophen (TYLENOL) tablet 650 mg (650 mg Oral Given 06/20/18 1550)  cloNIDine (CATAPRES) tablet 0.1 mg (0.1 mg Oral Given 06/20/18 1856)     Initial Impression / Assessment and Plan / ED Course  Triage vital signs and the nursing notes have been reviewed.  Pertinent labs & imaging results that were available during care of the patient were reviewed and considered in medical decision making (see chart for details).  Patient presents from her ALF with complaints of back pain. This back pain occurred after patient had a fall ~ 10 days ago. Patient unable to give much history given her cognitive state. However, she endorses thoracic and lumbar paraspinal muscle tenderness bilaterally. She is able to move her extremities without issue. No neuro deficits on exam. Given frail, osteopenic-like frame, will order total spine CT scan for further evaluation.  Clinical Course as of Jun 20 1953  Thu Jun 20, 2018  1558 Case discussed with Dr. Gwyneth Sprout. Tylenol given for pain relief.   [GM]  1824 Acute L3  endplate fracture with 15% vertebral height loss. Chronic T12 compression fracture seen. No other acute osseus changes. Pain controlled adequately with 650mg  Tylenol given. Patient observed ambulating around the ED without issue. Reviewed management with Dr. Shaune Pollack. Will discharge with TLSO brace and neurosurgery follow-up.   [GM]  1914 Continues to be hypertensive. Patient cannot recall if she has had her BP medications today. No s/s of end organ damage. Will administer clonidine 0.1mg  prior to discharge for BP.   [GM]  1946 Manual BP after clonidine is 126/88.   [GM]    Clinical Course User Index [GM] Mortis, Sharyon Medicus, PA-C   Final Clinical Impressions(s) / ED Diagnoses  1. L3 End Plate Fracture. Patient has current Rx for Tramadol that was filled on 06/17/18. Pain relief achieved with Tylenol in the ED. No additional pain meds will be prescribed. TLSO brace given in the ED. Referral to neurosurgery given for follow-up.  Dispo: Home. After thorough clinical evaluation, this patient is determined to be medically stable and can be safely discharged with the previously mentioned treatment and/or outpatient follow-up/referral(s). At this time, there are no other apparent medical conditions that require further screening, evaluation or treatment.   Final diagnoses:  Closed fracture of third lumbar vertebra, unspecified fracture morphology, initial encounter [facility])    ED Discharge Orders    None        Reva Bores 06/20/18 1954    Gwyneth Sprout, MD 06/20/18 2128

## 2018-06-20 NOTE — ED Notes (Signed)
Bed: TD17 Expected date:  Expected time:  Means of arrival:  Comments: 85yo back/flank pain

## 2018-06-21 DIAGNOSIS — S32000B Wedge compression fracture of unspecified lumbar vertebra, initial encounter for open fracture: Secondary | ICD-10-CM | POA: Diagnosis not present

## 2018-06-21 DIAGNOSIS — M545 Low back pain: Secondary | ICD-10-CM | POA: Diagnosis not present

## 2018-06-24 DIAGNOSIS — R262 Difficulty in walking, not elsewhere classified: Secondary | ICD-10-CM | POA: Diagnosis not present

## 2018-06-24 DIAGNOSIS — M6281 Muscle weakness (generalized): Secondary | ICD-10-CM | POA: Diagnosis not present

## 2018-06-24 DIAGNOSIS — G8929 Other chronic pain: Secondary | ICD-10-CM | POA: Diagnosis not present

## 2018-06-24 DIAGNOSIS — F0390 Unspecified dementia without behavioral disturbance: Secondary | ICD-10-CM | POA: Diagnosis not present

## 2018-06-24 DIAGNOSIS — R41841 Cognitive communication deficit: Secondary | ICD-10-CM | POA: Diagnosis not present

## 2018-06-24 DIAGNOSIS — I10 Essential (primary) hypertension: Secondary | ICD-10-CM | POA: Diagnosis not present

## 2018-06-24 DIAGNOSIS — F0280 Dementia in other diseases classified elsewhere without behavioral disturbance: Secondary | ICD-10-CM | POA: Diagnosis not present

## 2018-06-25 DIAGNOSIS — R41841 Cognitive communication deficit: Secondary | ICD-10-CM | POA: Diagnosis not present

## 2018-06-25 DIAGNOSIS — M6281 Muscle weakness (generalized): Secondary | ICD-10-CM | POA: Diagnosis not present

## 2018-06-25 DIAGNOSIS — R262 Difficulty in walking, not elsewhere classified: Secondary | ICD-10-CM | POA: Diagnosis not present

## 2018-06-25 DIAGNOSIS — G8929 Other chronic pain: Secondary | ICD-10-CM | POA: Diagnosis not present

## 2018-06-25 DIAGNOSIS — F0390 Unspecified dementia without behavioral disturbance: Secondary | ICD-10-CM | POA: Diagnosis not present

## 2018-06-26 DIAGNOSIS — R41841 Cognitive communication deficit: Secondary | ICD-10-CM | POA: Diagnosis not present

## 2018-06-26 DIAGNOSIS — F0390 Unspecified dementia without behavioral disturbance: Secondary | ICD-10-CM | POA: Diagnosis not present

## 2018-06-26 DIAGNOSIS — R262 Difficulty in walking, not elsewhere classified: Secondary | ICD-10-CM | POA: Diagnosis not present

## 2018-06-26 DIAGNOSIS — M6281 Muscle weakness (generalized): Secondary | ICD-10-CM | POA: Diagnosis not present

## 2018-06-26 DIAGNOSIS — G8929 Other chronic pain: Secondary | ICD-10-CM | POA: Diagnosis not present

## 2018-06-27 DIAGNOSIS — S32030A Wedge compression fracture of third lumbar vertebra, initial encounter for closed fracture: Secondary | ICD-10-CM | POA: Diagnosis not present

## 2018-06-27 DIAGNOSIS — R41841 Cognitive communication deficit: Secondary | ICD-10-CM | POA: Diagnosis not present

## 2018-06-27 DIAGNOSIS — M6281 Muscle weakness (generalized): Secondary | ICD-10-CM | POA: Diagnosis not present

## 2018-06-27 DIAGNOSIS — S32030D Wedge compression fracture of third lumbar vertebra, subsequent encounter for fracture with routine healing: Secondary | ICD-10-CM | POA: Diagnosis not present

## 2018-06-27 DIAGNOSIS — R262 Difficulty in walking, not elsewhere classified: Secondary | ICD-10-CM | POA: Diagnosis not present

## 2018-06-27 DIAGNOSIS — F0390 Unspecified dementia without behavioral disturbance: Secondary | ICD-10-CM | POA: Diagnosis not present

## 2018-06-27 DIAGNOSIS — G8929 Other chronic pain: Secondary | ICD-10-CM | POA: Diagnosis not present

## 2018-06-28 DIAGNOSIS — S32000B Wedge compression fracture of unspecified lumbar vertebra, initial encounter for open fracture: Secondary | ICD-10-CM | POA: Diagnosis not present

## 2018-06-28 DIAGNOSIS — R262 Difficulty in walking, not elsewhere classified: Secondary | ICD-10-CM | POA: Diagnosis not present

## 2018-06-28 DIAGNOSIS — M6281 Muscle weakness (generalized): Secondary | ICD-10-CM | POA: Diagnosis not present

## 2018-06-28 DIAGNOSIS — R41841 Cognitive communication deficit: Secondary | ICD-10-CM | POA: Diagnosis not present

## 2018-06-28 DIAGNOSIS — G8929 Other chronic pain: Secondary | ICD-10-CM | POA: Diagnosis not present

## 2018-06-28 DIAGNOSIS — F0390 Unspecified dementia without behavioral disturbance: Secondary | ICD-10-CM | POA: Diagnosis not present

## 2018-07-01 DIAGNOSIS — F0390 Unspecified dementia without behavioral disturbance: Secondary | ICD-10-CM | POA: Diagnosis not present

## 2018-07-01 DIAGNOSIS — G8929 Other chronic pain: Secondary | ICD-10-CM | POA: Diagnosis not present

## 2018-07-01 DIAGNOSIS — R262 Difficulty in walking, not elsewhere classified: Secondary | ICD-10-CM | POA: Diagnosis not present

## 2018-07-01 DIAGNOSIS — M6281 Muscle weakness (generalized): Secondary | ICD-10-CM | POA: Diagnosis not present

## 2018-07-01 DIAGNOSIS — R41841 Cognitive communication deficit: Secondary | ICD-10-CM | POA: Diagnosis not present

## 2018-07-02 DIAGNOSIS — M6281 Muscle weakness (generalized): Secondary | ICD-10-CM | POA: Diagnosis not present

## 2018-07-02 DIAGNOSIS — F0390 Unspecified dementia without behavioral disturbance: Secondary | ICD-10-CM | POA: Diagnosis not present

## 2018-07-02 DIAGNOSIS — R262 Difficulty in walking, not elsewhere classified: Secondary | ICD-10-CM | POA: Diagnosis not present

## 2018-07-02 DIAGNOSIS — R41841 Cognitive communication deficit: Secondary | ICD-10-CM | POA: Diagnosis not present

## 2018-07-02 DIAGNOSIS — G8929 Other chronic pain: Secondary | ICD-10-CM | POA: Diagnosis not present

## 2018-07-03 DIAGNOSIS — R41841 Cognitive communication deficit: Secondary | ICD-10-CM | POA: Diagnosis not present

## 2018-07-03 DIAGNOSIS — F0390 Unspecified dementia without behavioral disturbance: Secondary | ICD-10-CM | POA: Diagnosis not present

## 2018-07-03 DIAGNOSIS — M545 Low back pain: Secondary | ICD-10-CM | POA: Diagnosis not present

## 2018-07-03 DIAGNOSIS — R634 Abnormal weight loss: Secondary | ICD-10-CM | POA: Diagnosis not present

## 2018-07-03 DIAGNOSIS — M6281 Muscle weakness (generalized): Secondary | ICD-10-CM | POA: Diagnosis not present

## 2018-07-03 DIAGNOSIS — F0150 Vascular dementia without behavioral disturbance: Secondary | ICD-10-CM | POA: Diagnosis not present

## 2018-07-03 DIAGNOSIS — G8929 Other chronic pain: Secondary | ICD-10-CM | POA: Diagnosis not present

## 2018-07-03 DIAGNOSIS — R262 Difficulty in walking, not elsewhere classified: Secondary | ICD-10-CM | POA: Diagnosis not present

## 2018-07-04 DIAGNOSIS — R41841 Cognitive communication deficit: Secondary | ICD-10-CM | POA: Diagnosis not present

## 2018-07-04 DIAGNOSIS — M6281 Muscle weakness (generalized): Secondary | ICD-10-CM | POA: Diagnosis not present

## 2018-07-04 DIAGNOSIS — G8929 Other chronic pain: Secondary | ICD-10-CM | POA: Diagnosis not present

## 2018-07-04 DIAGNOSIS — F0390 Unspecified dementia without behavioral disturbance: Secondary | ICD-10-CM | POA: Diagnosis not present

## 2018-07-04 DIAGNOSIS — R262 Difficulty in walking, not elsewhere classified: Secondary | ICD-10-CM | POA: Diagnosis not present

## 2018-07-05 DIAGNOSIS — R262 Difficulty in walking, not elsewhere classified: Secondary | ICD-10-CM | POA: Diagnosis not present

## 2018-07-05 DIAGNOSIS — G8929 Other chronic pain: Secondary | ICD-10-CM | POA: Diagnosis not present

## 2018-07-05 DIAGNOSIS — M6281 Muscle weakness (generalized): Secondary | ICD-10-CM | POA: Diagnosis not present

## 2018-07-05 DIAGNOSIS — F0390 Unspecified dementia without behavioral disturbance: Secondary | ICD-10-CM | POA: Diagnosis not present

## 2018-07-05 DIAGNOSIS — R41841 Cognitive communication deficit: Secondary | ICD-10-CM | POA: Diagnosis not present

## 2018-07-08 DIAGNOSIS — R41841 Cognitive communication deficit: Secondary | ICD-10-CM | POA: Diagnosis not present

## 2018-07-08 DIAGNOSIS — M6281 Muscle weakness (generalized): Secondary | ICD-10-CM | POA: Diagnosis not present

## 2018-07-08 DIAGNOSIS — R262 Difficulty in walking, not elsewhere classified: Secondary | ICD-10-CM | POA: Diagnosis not present

## 2018-07-08 DIAGNOSIS — F0390 Unspecified dementia without behavioral disturbance: Secondary | ICD-10-CM | POA: Diagnosis not present

## 2018-07-08 DIAGNOSIS — G8929 Other chronic pain: Secondary | ICD-10-CM | POA: Diagnosis not present

## 2018-07-09 DIAGNOSIS — F0390 Unspecified dementia without behavioral disturbance: Secondary | ICD-10-CM | POA: Diagnosis not present

## 2018-07-09 DIAGNOSIS — R262 Difficulty in walking, not elsewhere classified: Secondary | ICD-10-CM | POA: Diagnosis not present

## 2018-07-09 DIAGNOSIS — R41841 Cognitive communication deficit: Secondary | ICD-10-CM | POA: Diagnosis not present

## 2018-07-09 DIAGNOSIS — G8929 Other chronic pain: Secondary | ICD-10-CM | POA: Diagnosis not present

## 2018-07-09 DIAGNOSIS — M6281 Muscle weakness (generalized): Secondary | ICD-10-CM | POA: Diagnosis not present

## 2018-07-17 DIAGNOSIS — F0280 Dementia in other diseases classified elsewhere without behavioral disturbance: Secondary | ICD-10-CM | POA: Diagnosis not present

## 2018-07-17 DIAGNOSIS — G301 Alzheimer's disease with late onset: Secondary | ICD-10-CM | POA: Diagnosis not present

## 2018-07-17 DIAGNOSIS — F331 Major depressive disorder, recurrent, moderate: Secondary | ICD-10-CM | POA: Diagnosis not present

## 2018-07-18 ENCOUNTER — Encounter: Payer: Self-pay | Admitting: Cardiology

## 2018-07-25 DIAGNOSIS — E039 Hypothyroidism, unspecified: Secondary | ICD-10-CM | POA: Diagnosis not present

## 2018-07-25 DIAGNOSIS — I10 Essential (primary) hypertension: Secondary | ICD-10-CM | POA: Diagnosis not present

## 2018-08-07 DIAGNOSIS — L603 Nail dystrophy: Secondary | ICD-10-CM | POA: Diagnosis not present

## 2018-08-07 DIAGNOSIS — Q845 Enlarged and hypertrophic nails: Secondary | ICD-10-CM | POA: Diagnosis not present

## 2018-08-07 DIAGNOSIS — I739 Peripheral vascular disease, unspecified: Secondary | ICD-10-CM | POA: Diagnosis not present

## 2018-08-07 DIAGNOSIS — B351 Tinea unguium: Secondary | ICD-10-CM | POA: Diagnosis not present

## 2018-08-08 ENCOUNTER — Ambulatory Visit (INDEPENDENT_AMBULATORY_CARE_PROVIDER_SITE_OTHER): Payer: Medicare Other | Admitting: *Deleted

## 2018-08-08 DIAGNOSIS — I255 Ischemic cardiomyopathy: Secondary | ICD-10-CM

## 2018-08-08 DIAGNOSIS — I5022 Chronic systolic (congestive) heart failure: Secondary | ICD-10-CM

## 2018-08-08 NOTE — Progress Notes (Signed)
Remote ICD transmission.   

## 2018-08-14 DIAGNOSIS — F0280 Dementia in other diseases classified elsewhere without behavioral disturbance: Secondary | ICD-10-CM | POA: Diagnosis not present

## 2018-08-14 DIAGNOSIS — G301 Alzheimer's disease with late onset: Secondary | ICD-10-CM | POA: Diagnosis not present

## 2018-08-14 DIAGNOSIS — F331 Major depressive disorder, recurrent, moderate: Secondary | ICD-10-CM | POA: Diagnosis not present

## 2018-09-03 DIAGNOSIS — E039 Hypothyroidism, unspecified: Secondary | ICD-10-CM | POA: Diagnosis not present

## 2018-09-03 DIAGNOSIS — R634 Abnormal weight loss: Secondary | ICD-10-CM | POA: Diagnosis not present

## 2018-09-03 DIAGNOSIS — M6281 Muscle weakness (generalized): Secondary | ICD-10-CM | POA: Diagnosis not present

## 2018-09-03 DIAGNOSIS — F329 Major depressive disorder, single episode, unspecified: Secondary | ICD-10-CM | POA: Diagnosis not present

## 2018-09-03 DIAGNOSIS — N181 Chronic kidney disease, stage 1: Secondary | ICD-10-CM | POA: Diagnosis not present

## 2018-09-03 DIAGNOSIS — I10 Essential (primary) hypertension: Secondary | ICD-10-CM | POA: Diagnosis not present

## 2018-09-03 DIAGNOSIS — J449 Chronic obstructive pulmonary disease, unspecified: Secondary | ICD-10-CM | POA: Diagnosis not present

## 2018-09-03 DIAGNOSIS — E876 Hypokalemia: Secondary | ICD-10-CM | POA: Diagnosis not present

## 2018-09-03 DIAGNOSIS — J302 Other seasonal allergic rhinitis: Secondary | ICD-10-CM | POA: Diagnosis not present

## 2018-09-03 DIAGNOSIS — G8929 Other chronic pain: Secondary | ICD-10-CM | POA: Diagnosis not present

## 2018-09-03 DIAGNOSIS — K219 Gastro-esophageal reflux disease without esophagitis: Secondary | ICD-10-CM | POA: Diagnosis not present

## 2018-09-03 DIAGNOSIS — E568 Deficiency of other vitamins: Secondary | ICD-10-CM | POA: Diagnosis not present

## 2018-09-16 DIAGNOSIS — G894 Chronic pain syndrome: Secondary | ICD-10-CM | POA: Diagnosis not present

## 2018-09-16 DIAGNOSIS — M545 Low back pain: Secondary | ICD-10-CM | POA: Diagnosis not present

## 2018-09-20 DIAGNOSIS — D649 Anemia, unspecified: Secondary | ICD-10-CM | POA: Diagnosis not present

## 2018-09-20 DIAGNOSIS — R69 Illness, unspecified: Secondary | ICD-10-CM | POA: Diagnosis not present

## 2018-09-20 DIAGNOSIS — Z79899 Other long term (current) drug therapy: Secondary | ICD-10-CM | POA: Diagnosis not present

## 2018-09-20 DIAGNOSIS — R6889 Other general symptoms and signs: Secondary | ICD-10-CM | POA: Diagnosis not present

## 2018-09-20 DIAGNOSIS — E039 Hypothyroidism, unspecified: Secondary | ICD-10-CM | POA: Diagnosis not present

## 2018-10-08 LAB — CUP PACEART REMOTE DEVICE CHECK
Battery Voltage: 2.96 V
Brady Statistic AS VS Percent: 1 %
Brady Statistic RA Percent Paced: 64 %
Date Time Interrogation Session: 20191114180908
HighPow Impedance: 56 Ohm
HighPow Impedance: 56 Ohm
Implantable Lead Implant Date: 20100707
Implantable Lead Implant Date: 20100707
Implantable Lead Location: 753858
Implantable Lead Location: 753860
Implantable Pulse Generator Implant Date: 20160217
Lead Channel Impedance Value: 380 Ohm
Lead Channel Impedance Value: 440 Ohm
Lead Channel Impedance Value: 580 Ohm
Lead Channel Pacing Threshold Amplitude: 1 V
Lead Channel Pacing Threshold Amplitude: 1 V
Lead Channel Pacing Threshold Pulse Width: 0.5 ms
Lead Channel Sensing Intrinsic Amplitude: 12 mV
Lead Channel Setting Pacing Amplitude: 2 V
Lead Channel Setting Pacing Pulse Width: 0.6 ms
Lead Channel Setting Sensing Sensitivity: 0.5 mV
MDC IDC LEAD IMPLANT DT: 20100707
MDC IDC LEAD LOCATION: 753859
MDC IDC MSMT BATTERY REMAINING LONGEVITY: 36 mo
MDC IDC MSMT BATTERY REMAINING PERCENTAGE: 47 %
MDC IDC MSMT LEADCHNL RA PACING THRESHOLD AMPLITUDE: 0.5 V
MDC IDC MSMT LEADCHNL RA PACING THRESHOLD PULSEWIDTH: 0.5 ms
MDC IDC MSMT LEADCHNL RA SENSING INTR AMPL: 1.5 mV
MDC IDC MSMT LEADCHNL RV PACING THRESHOLD PULSEWIDTH: 0.6 ms
MDC IDC SET LEADCHNL LV PACING AMPLITUDE: 2 V
MDC IDC SET LEADCHNL LV PACING PULSEWIDTH: 0.5 ms
MDC IDC SET LEADCHNL RV PACING AMPLITUDE: 2.5 V
MDC IDC STAT BRADY AP VP PERCENT: 64 %
MDC IDC STAT BRADY AP VS PERCENT: 1 %
MDC IDC STAT BRADY AS VP PERCENT: 35 %
Pulse Gen Serial Number: 7226908

## 2018-11-07 ENCOUNTER — Ambulatory Visit (INDEPENDENT_AMBULATORY_CARE_PROVIDER_SITE_OTHER): Payer: Medicare Other

## 2018-11-07 DIAGNOSIS — I255 Ischemic cardiomyopathy: Secondary | ICD-10-CM

## 2018-11-07 DIAGNOSIS — I5022 Chronic systolic (congestive) heart failure: Secondary | ICD-10-CM

## 2018-11-08 LAB — CUP PACEART REMOTE DEVICE CHECK
Battery Voltage: 2.95 V
Brady Statistic AP VP Percent: 64 %
Brady Statistic AP VS Percent: 1 %
Brady Statistic AS VP Percent: 35 %
Brady Statistic AS VS Percent: 1 %
Date Time Interrogation Session: 20200213070017
HighPow Impedance: 51 Ohm
HighPow Impedance: 51 Ohm
Implantable Lead Implant Date: 20100707
Implantable Lead Implant Date: 20100707
Implantable Lead Location: 753859
Implantable Lead Model: 7120
Implantable Pulse Generator Implant Date: 20160217
Lead Channel Impedance Value: 380 Ohm
Lead Channel Pacing Threshold Amplitude: 0.5 V
Lead Channel Sensing Intrinsic Amplitude: 1.6 mV
Lead Channel Sensing Intrinsic Amplitude: 12 mV
Lead Channel Setting Pacing Amplitude: 2 V
Lead Channel Setting Pacing Amplitude: 2.5 V
Lead Channel Setting Pacing Pulse Width: 0.5 ms
Lead Channel Setting Pacing Pulse Width: 0.6 ms
MDC IDC LEAD IMPLANT DT: 20100707
MDC IDC LEAD LOCATION: 753858
MDC IDC LEAD LOCATION: 753860
MDC IDC MSMT BATTERY REMAINING LONGEVITY: 34 mo
MDC IDC MSMT BATTERY REMAINING PERCENTAGE: 44 %
MDC IDC MSMT LEADCHNL LV IMPEDANCE VALUE: 580 Ohm
MDC IDC MSMT LEADCHNL LV PACING THRESHOLD AMPLITUDE: 1 V
MDC IDC MSMT LEADCHNL LV PACING THRESHOLD PULSEWIDTH: 0.5 ms
MDC IDC MSMT LEADCHNL RA PACING THRESHOLD PULSEWIDTH: 0.5 ms
MDC IDC MSMT LEADCHNL RV IMPEDANCE VALUE: 460 Ohm
MDC IDC MSMT LEADCHNL RV PACING THRESHOLD AMPLITUDE: 1 V
MDC IDC MSMT LEADCHNL RV PACING THRESHOLD PULSEWIDTH: 0.6 ms
MDC IDC SET LEADCHNL LV PACING AMPLITUDE: 2 V
MDC IDC SET LEADCHNL RV SENSING SENSITIVITY: 0.5 mV
MDC IDC STAT BRADY RA PERCENT PACED: 64 %
Pulse Gen Serial Number: 7226908

## 2018-11-19 NOTE — Progress Notes (Signed)
Remote ICD transmission.   

## 2019-02-06 ENCOUNTER — Other Ambulatory Visit: Payer: Self-pay

## 2019-02-06 ENCOUNTER — Ambulatory Visit (INDEPENDENT_AMBULATORY_CARE_PROVIDER_SITE_OTHER): Payer: Medicare Other | Admitting: *Deleted

## 2019-02-06 DIAGNOSIS — I255 Ischemic cardiomyopathy: Secondary | ICD-10-CM | POA: Diagnosis not present

## 2019-02-06 DIAGNOSIS — I5022 Chronic systolic (congestive) heart failure: Secondary | ICD-10-CM

## 2019-02-07 ENCOUNTER — Telehealth: Payer: Self-pay

## 2019-02-07 LAB — CUP PACEART REMOTE DEVICE CHECK
Date Time Interrogation Session: 20200515201029
Implantable Lead Implant Date: 20100707
Implantable Lead Implant Date: 20100707
Implantable Lead Implant Date: 20100707
Implantable Lead Location: 753858
Implantable Lead Location: 753859
Implantable Lead Location: 753860
Implantable Lead Model: 7120
Implantable Pulse Generator Implant Date: 20160217
Pulse Gen Serial Number: 7226908

## 2019-02-07 NOTE — Telephone Encounter (Signed)
Left message for patient to remind of missed remote transmission.  

## 2019-02-11 ENCOUNTER — Encounter: Payer: Self-pay | Admitting: Cardiology

## 2019-02-11 NOTE — Progress Notes (Signed)
Remote ICD transmission.   

## 2019-02-26 ENCOUNTER — Non-Acute Institutional Stay: Payer: Medicare Other | Admitting: Internal Medicine

## 2019-02-26 ENCOUNTER — Other Ambulatory Visit: Payer: Self-pay

## 2019-02-26 DIAGNOSIS — Z515 Encounter for palliative care: Secondary | ICD-10-CM

## 2019-02-26 NOTE — Progress Notes (Signed)
Therapist, nutritional Palliative Care Consult Note Telephone: [phone]  Fax: [phone]  PATIENT NAME: [name] DOB: 1933-05-02 MRN: [id number]  PRIMARY CARE PROVIDER:   Jethro Bastos, MD  REFERRING PROVIDER:  Lytle Butte, NP/ [place] [id number] [place], [place] [address]  RESPONSIBLE PARTYRussella Dar Hopkin(son) [phone] cell,  [phone] wk    RECOMMENDATIONS and PLAN:  Palliative Care Encounter  Z51.5  1.  Advance care planning:  Palliative care introduced.  Patient is unable to make own decisions due to cognitive deficits.  Plan on further discussion with family member for goals of care. Palliative care will continue to follow patient.   2.  Memory Loss: Stable with current use of Namzarec. FAST stage 7b Continue supportive care.    Due to the COVID-19 crisis, this visit was performed via telehealth from my office and was consented by patient and or family.  I spent 30 minutes providing this consultation,  from 1200 to 1230. More than 50% of the time in this consultation was spent coordinating communication with patient and clinical staff.   HISTORY OF PRESENT ILLNESS:  [name] is a 85 y.o. year old female with multiple medical problems including chronic small vessel disease and previous L thalamus infarct on head CT scan from 08/2015, CHF. Nurse reports that she is dependent upon staff for all ADLs and some feedings, she is incontinent of B&B and is non-ambulatory.  Palliative Care was asked to help address goals of care.   CODE STATUS: DNAR  PPS: 40% HOSPICE ELIGIBILITY/DIAGNOSIS: TBD  PAST MEDICAL HISTORY:  Past Medical History:  Diagnosis Date  . Anemia    takes iron 3 days per week  . Asthma   . Chronic systolic heart failure (HCC)    NYHA class II.  [name] COPD (chronic obstructive pulmonary disease) (HCC)   . DJD (degenerative joint disease), cervical   . DJD (degenerative joint disease), lumbar   .  FH: mitral valve repair    with 26 mm Edwards ring angioplasty,   . Full dentures   . GERD (gastroesophageal reflux disease)   . HTN (hypertension)   . Hx of cardiovascular stress test    Lexiscan Myoview (9/15):  Normal stress nuclear study.  LV Ejection Fraction: 76%  . Hyperthyroidism    following Graves disease  . Ischemic cardiomyopathy    severe. Left ventricular ejection fraction 20%.   . Neuromuscular scoliosis of thoracolumbar region    type of scoliosis was not specified.   . Raynaud's syndrome   . Renal artery stenosis (HCC)    Treated with angioplast in 1980 and 1987.   . S/P CABG (coronary artery bypass graft) April 2012       PERTINENT MEDICATIONS:  Outpatient Encounter Medications as of 02/26/2019  Medication Sig  . acetaminophen (TYLENOL) 500 MG tablet Take 500 mg by mouth 2 (two) times daily.  . carvedilol (COREG) 12.5 MG tablet Take 1 tablet (12.5 mg total) by mouth 2 (two) times daily. (Patient not taking: Reported on 06/20/2018)  . carvedilol (COREG) 6.25 MG tablet Take 6.25 mg by mouth 2 (two) times daily with a meal.  . cholecalciferol (VITAMIN D) 1000 units tablet Take 1,000 Units by mouth daily.  [name] levothyroxine (SYNTHROID, LEVOTHROID) 88 MCG tablet Take 88 mcg by mouth daily before breakfast.  . montelukast (SINGULAIR) 10 MG tablet Take 10 mg by mouth at bedtime.  [name] NAMZARIC 28-10 MG CP24 Take 1 capsule by  mouth daily.  . Probiotic Product (PROBIOTIC COLON SUPPORT PO) Take 1 tablet by mouth daily.  . sertraline (ZOLOFT) 25 MG tablet Take 25 mg by mouth daily.  . traMADol (ULTRAM) 50 MG tablet Take 50 mg by mouth every 12 (twelve) hours.  [name] UNABLE TO FIND Med Pass 2.0- Drink 4 ounces by mouth two times a day   No facility-administered encounter medications on file as of 02/26/2019.     PHYSICAL EXAM:   General: NAD, frail appearing elderly female Cardiovascular: No JVD Pulmonary: no increased respiratory effort Extremities: no edema, no joint deformities  Skin: exposed skin is intact Neurological: Alert. Unable to determine orientation due to cognitive decline. Weakness but otherwise nonfocal  Margaretha Sheffield, NP-C

## 2019-03-04 ENCOUNTER — Other Ambulatory Visit: Payer: Self-pay

## 2019-03-04 ENCOUNTER — Non-Acute Institutional Stay: Payer: Medicare Other | Admitting: Internal Medicine

## 2019-03-04 DIAGNOSIS — Z515 Encounter for palliative care: Secondary | ICD-10-CM

## 2019-04-11 ENCOUNTER — Non-Acute Institutional Stay: Payer: Medicare Other | Admitting: Internal Medicine

## 2019-05-08 ENCOUNTER — Ambulatory Visit (INDEPENDENT_AMBULATORY_CARE_PROVIDER_SITE_OTHER): Payer: Medicare Other | Admitting: *Deleted

## 2019-05-08 DIAGNOSIS — I255 Ischemic cardiomyopathy: Secondary | ICD-10-CM

## 2019-05-08 DIAGNOSIS — I5022 Chronic systolic (congestive) heart failure: Secondary | ICD-10-CM

## 2019-05-08 LAB — CUP PACEART REMOTE DEVICE CHECK
Battery Remaining Longevity: 29 mo
Battery Remaining Percentage: 37 %
Battery Voltage: 2.93 V
Brady Statistic AP VP Percent: 65 %
Brady Statistic AP VS Percent: 1 %
Brady Statistic AS VP Percent: 35 %
Brady Statistic AS VS Percent: 1 %
Brady Statistic RA Percent Paced: 65 %
Brady Statistic RV Percent Paced: 99 %
Date Time Interrogation Session: 20200813060015
HighPow Impedance: 51 Ohm
HighPow Impedance: 51 Ohm
Implantable Lead Implant Date: 20100707
Implantable Lead Implant Date: 20100707
Implantable Lead Implant Date: 20100707
Implantable Lead Location: 753858
Implantable Lead Location: 753859
Implantable Lead Location: 753860
Implantable Lead Model: 7120
Implantable Pulse Generator Implant Date: 20160217
Lead Channel Impedance Value: 360 Ohm
Lead Channel Impedance Value: 490 Ohm
Lead Channel Impedance Value: 560 Ohm
Lead Channel Sensing Intrinsic Amplitude: 1.3 mV
Lead Channel Sensing Intrinsic Amplitude: 12 mV
Lead Channel Setting Pacing Amplitude: 2 V
Lead Channel Setting Pacing Amplitude: 2 V
Lead Channel Setting Pacing Amplitude: 2.5 V
Lead Channel Setting Pacing Pulse Width: 0.5 ms
Lead Channel Setting Pacing Pulse Width: 0.6 ms
Lead Channel Setting Sensing Sensitivity: 0.5 mV
Pulse Gen Serial Number: 7226908

## 2019-05-19 ENCOUNTER — Encounter: Payer: Self-pay | Admitting: Cardiology

## 2019-05-19 NOTE — Progress Notes (Signed)
Remote ICD transmission.   

## 2019-07-02 ENCOUNTER — Other Ambulatory Visit: Payer: Self-pay

## 2019-07-02 ENCOUNTER — Non-Acute Institutional Stay: Payer: Medicare Other | Admitting: Internal Medicine

## 2019-08-05 ENCOUNTER — Other Ambulatory Visit: Payer: Self-pay

## 2019-08-05 ENCOUNTER — Non-Acute Institutional Stay: Payer: Medicare Other | Admitting: Internal Medicine

## 2019-08-07 ENCOUNTER — Ambulatory Visit (INDEPENDENT_AMBULATORY_CARE_PROVIDER_SITE_OTHER): Payer: Medicare Other | Admitting: *Deleted

## 2019-08-07 DIAGNOSIS — I5022 Chronic systolic (congestive) heart failure: Secondary | ICD-10-CM

## 2019-08-07 DIAGNOSIS — I255 Ischemic cardiomyopathy: Secondary | ICD-10-CM

## 2019-08-07 LAB — CUP PACEART REMOTE DEVICE CHECK
Battery Remaining Longevity: 25 mo
Battery Remaining Percentage: 34 %
Battery Voltage: 2.93 V
Brady Statistic AP VP Percent: 61 %
Brady Statistic AP VS Percent: 1 %
Brady Statistic AS VP Percent: 38 %
Brady Statistic AS VS Percent: 1 %
Brady Statistic RA Percent Paced: 61 %
Brady Statistic RV Percent Paced: 99 %
Date Time Interrogation Session: 20201112070016
HighPow Impedance: 49 Ohm
HighPow Impedance: 49 Ohm
Implantable Lead Implant Date: 20100707
Implantable Lead Implant Date: 20100707
Implantable Lead Implant Date: 20100707
Implantable Lead Location: 753858
Implantable Lead Location: 753859
Implantable Lead Location: 753860
Implantable Lead Model: 7120
Implantable Pulse Generator Implant Date: 20160217
Lead Channel Impedance Value: 330 Ohm
Lead Channel Impedance Value: 410 Ohm
Lead Channel Impedance Value: 530 Ohm
Lead Channel Pacing Threshold Amplitude: 0.5 V
Lead Channel Pacing Threshold Amplitude: 1 V
Lead Channel Pacing Threshold Amplitude: 1 V
Lead Channel Pacing Threshold Pulse Width: 0.5 ms
Lead Channel Pacing Threshold Pulse Width: 0.5 ms
Lead Channel Pacing Threshold Pulse Width: 0.6 ms
Lead Channel Sensing Intrinsic Amplitude: 1.2 mV
Lead Channel Sensing Intrinsic Amplitude: 12 mV
Lead Channel Setting Pacing Amplitude: 2 V
Lead Channel Setting Pacing Amplitude: 2 V
Lead Channel Setting Pacing Amplitude: 2.5 V
Lead Channel Setting Pacing Pulse Width: 0.5 ms
Lead Channel Setting Pacing Pulse Width: 0.6 ms
Lead Channel Setting Sensing Sensitivity: 0.5 mV
Pulse Gen Serial Number: 7226908

## 2019-08-29 NOTE — Progress Notes (Signed)
Remote ICD transmission.   

## 2019-10-27 ENCOUNTER — Other Ambulatory Visit: Payer: Self-pay

## 2019-10-27 ENCOUNTER — Non-Acute Institutional Stay: Payer: Medicare Other | Admitting: Internal Medicine

## 2019-11-05 ENCOUNTER — Non-Acute Institutional Stay: Payer: Medicare Other | Admitting: Internal Medicine

## 2019-11-06 ENCOUNTER — Ambulatory Visit (INDEPENDENT_AMBULATORY_CARE_PROVIDER_SITE_OTHER): Payer: Medicare Other | Admitting: *Deleted

## 2019-11-06 DIAGNOSIS — I255 Ischemic cardiomyopathy: Secondary | ICD-10-CM

## 2019-11-06 LAB — CUP PACEART REMOTE DEVICE CHECK
Battery Remaining Longevity: 24 mo
Battery Remaining Percentage: 31 %
Battery Voltage: 2.93 V
Brady Statistic AP VP Percent: 57 %
Brady Statistic AP VS Percent: 1 %
Brady Statistic AS VP Percent: 42 %
Brady Statistic AS VS Percent: 1 %
Brady Statistic RA Percent Paced: 58 %
Brady Statistic RV Percent Paced: 99 %
Date Time Interrogation Session: 20210211020016
HighPow Impedance: 46 Ohm
HighPow Impedance: 46 Ohm
Implantable Lead Implant Date: 20100707
Implantable Lead Implant Date: 20100707
Implantable Lead Implant Date: 20100707
Implantable Lead Location: 753858
Implantable Lead Location: 753859
Implantable Lead Location: 753860
Implantable Lead Model: 7120
Implantable Pulse Generator Implant Date: 20160217
Lead Channel Impedance Value: 350 Ohm
Lead Channel Impedance Value: 400 Ohm
Lead Channel Impedance Value: 540 Ohm
Lead Channel Pacing Threshold Amplitude: 0.5 V
Lead Channel Pacing Threshold Amplitude: 1 V
Lead Channel Pacing Threshold Amplitude: 1 V
Lead Channel Pacing Threshold Pulse Width: 0.5 ms
Lead Channel Pacing Threshold Pulse Width: 0.5 ms
Lead Channel Pacing Threshold Pulse Width: 0.6 ms
Lead Channel Sensing Intrinsic Amplitude: 1.2 mV
Lead Channel Sensing Intrinsic Amplitude: 11.4 mV
Lead Channel Setting Pacing Amplitude: 2 V
Lead Channel Setting Pacing Amplitude: 2 V
Lead Channel Setting Pacing Amplitude: 2.5 V
Lead Channel Setting Pacing Pulse Width: 0.5 ms
Lead Channel Setting Pacing Pulse Width: 0.6 ms
Lead Channel Setting Sensing Sensitivity: 0.5 mV
Pulse Gen Serial Number: 7226908

## 2019-11-06 NOTE — Progress Notes (Signed)
ICD Remote  

## 2019-11-19 NOTE — Progress Notes (Signed)
    Therapist, nutritional Palliative Care Consult Note Telephone: [phone]  Fax: [phone]  PATIENT NAME: [name] DOB: 1932/11/21 MRN: [id number]  PRIMARY CARE PROVIDER:   Jethro Bastos, MD  REFERRING PROVIDER:  Lytle Butte, NP/ [place] [id number] [place], [place] [address]  RESPONSIBLE PARTYRussella Dar Grivas(son) [phone] cell,  [phone] wk    RECOMMENDATIONS and PLAN:  Palliative Care Encounter  Z51.5  1.  Advance care planning: Patient is unable to make her own decisions.  Plan on continuing attempt to contact son for discussion related to goals of care and advanced directives. She is appropriate for transition to Hospice care.  Palliative will continue to follow until transition is made.  2.  Vascular dementia : Exhibiting cognitive and functional decline. End stage FAST stage 7d Continue supportive care  3.  Protein calorie malnutrition with weight loss:  Significant weight loss.  Not life sustaining if no improvement in nutritional status.  Nearing end of life. Consider transition to Hospice care for comfort if son is in agreement.    Due to the COVID-19 crisis, this visit was performed via telehealth from my office and was consented by patient and or family.  I spent 30 minutes providing this consultation,  from 1130 to 1200. More than 50% of the time in this consultation was spent coordinating communication with patient and clinical staff.   HISTORY OF PRESENT ILLNESS: Follow-up for Tyrina I Weiler. Nurse reports that she is totally dependent upon staff for all ADLs  she is incontinent of B&B and is non-ambulatory.She has very little nutritional intake with only sips of liquids and occasional bites of food. Her weight has decreased to upper 80s to low 90s.  She no longer interacts with conversation. Palliative Care was asked to help address goals of care.   CODE STATUS: DNAR/DNI  PPS: 20% HOSPICE  ELIGIBILITY/DIAGNOSIS: YES/ End stage vascular dementia. With protein calorie malnutrition  PAST MEDICAL HISTORY:  Past Medical History:  Diagnosis Date  . Anemia    takes iron 3 days per week  . Asthma   . Chronic systolic heart failure (HCC)    NYHA class II.  [name] COPD (chronic obstructive pulmonary disease) (HCC)   . DJD (degenerative joint disease), cervical   . DJD (degenerative joint disease), lumbar   . FH: mitral valve repair    with 26 mm Edwards ring angioplasty,   . Full dentures   . GERD (gastroesophageal reflux disease)   . HTN (hypertension)   . Hx of cardiovascular stress test    Lexiscan Myoview (9/15):  Normal stress nuclear study.  LV Ejection Fraction: 76%  . Hyperthyroidism    following Graves disease  . Ischemic cardiomyopathy    severe. Left ventricular ejection fraction 20%.   . Neuromuscular scoliosis of thoracolumbar region    type of scoliosis was not specified.   . Raynaud's syndrome   . Renal artery stenosis (HCC)    Treated with angioplast in 1980 and 1987.   . S/P CABG (coronary artery bypass graft) April 2012       Vascular dementia   PHYSICAL EXAM:   General: NAD, frail appearing elderly female lying in bed Pulmonary: no increased respiratory effort Skin: exposed skin is intact Neurological: Alert. Non verbal. Unable to determine orientation due to cognitive decline.Weakness   Margaretha Sheffield, NP-C

## 2019-11-21 ENCOUNTER — Telehealth: Payer: Self-pay

## 2019-11-21 NOTE — Telephone Encounter (Signed)
Confirmed that pt does have an ICD which if pt enters a shockable rhythm she will receive a shock for.  Caller indicates pt is entering hospice care with DNR status.  Advised will need an MD order to have [employer] rep come to home to turn off ICD mechanism.  Device will still pace pt however.

## 2019-11-21 NOTE — Telephone Encounter (Signed)
[name] is in hospice care. I told her we would need the hospice doctor order to deactivate the ICD. I told her once we get the order a [employer] rep can come out to turn off the ICD. I let her talk with device nurse Amy, Rn.

## 2019-12-03 ENCOUNTER — Telehealth: Payer: Self-pay | Admitting: Internal Medicine

## 2019-12-03 NOTE — Telephone Encounter (Signed)
Eliberto Ivory, patients nurse from [facility], states that the patient is under hospice care. The family is wondering if the patient's heart stops is there any way that we can stop her defibrillator from kicking in. Eliberto Ivory is leaving for the day so please contact the supervisor, Razeeka, at [phone]. She is the supervisor until 7pm tonight.

## 2019-12-03 NOTE — Telephone Encounter (Signed)
Spoke with Janeth Rase and Lynett Grimes, hospice RN, as Eliberto Ivory is gone for the day. Pt is now a DNR and is receiving hospice care. Per Amy, pt's family is requesting to have ICD deactivated. Explained that if death is imminent, magnet should be applied to inhibit VT/VF detection. Hospice provider will write order for ICD deactivation and will fax order to [facility].   Attempted to contact pt's son, Novalynn Branaman. LMOVM requesting call back to DC. Direct number provided.  Faxed order received--telephone order from [facility], NP/Amy Laural Benes, RN. Called back and spoke with Amy. Per, Amy, patient's son was made aware of plan to deactivate ICD therapies as of hospice admission on 11/21/19. Will forward to Dr. Johney Frame as an Lorain Childes and make [employer] representative aware.  Gaye Alken returned call. Confirmed that he wishes to have pt's ICD deactivated. He is aware that her pacemaker function will be unaffected and that we will continue to monitor her remotely. He denies additional questions or concerns at this time.  Luster Landsberg. [employer] representative, aware and will arrange appointment at [facility] for reprogramming.

## 2019-12-04 NOTE — Telephone Encounter (Addendum)
Received confirmation from [place], Satira Sark. [employer] representative, that ICD detection programmed off as of 12/04/19.

## 2019-12-17 ENCOUNTER — Telehealth: Payer: Self-pay

## 2019-12-17 NOTE — Telephone Encounter (Signed)
The hospice nurse called asking why we take the pt home monitor? I told her our office did not take the monitor. I do not know who took it but I told her I will order the pt a new monitor.

## 2020-01-25 ENCOUNTER — Encounter (HOSPITAL_COMMUNITY): Payer: Self-pay

## 2020-01-25 ENCOUNTER — Emergency Department (HOSPITAL_COMMUNITY)
Admission: EM | Admit: 2020-01-25 | Discharge: 2020-01-25 | Disposition: A | Discharge status: Home / Self Care | Attending: Emergency Medicine | Admitting: Emergency Medicine

## 2020-01-25 ENCOUNTER — Emergency Department (HOSPITAL_COMMUNITY)

## 2020-01-25 ENCOUNTER — Other Ambulatory Visit: Payer: Self-pay

## 2020-01-25 DIAGNOSIS — I130 Hypertensive heart and chronic kidney disease with heart failure and stage 1 through stage 4 chronic kidney disease, or unspecified chronic kidney disease: Secondary | ICD-10-CM | POA: Diagnosis not present

## 2020-01-25 DIAGNOSIS — S0180XA Unspecified open wound of other part of head, initial encounter: Secondary | ICD-10-CM | POA: Insufficient documentation

## 2020-01-25 DIAGNOSIS — I2510 Atherosclerotic heart disease of native coronary artery without angina pectoris: Secondary | ICD-10-CM | POA: Diagnosis not present

## 2020-01-25 DIAGNOSIS — Z96652 Presence of left artificial knee joint: Secondary | ICD-10-CM | POA: Diagnosis not present

## 2020-01-25 DIAGNOSIS — S0993XA Unspecified injury of face, initial encounter: Secondary | ICD-10-CM | POA: Diagnosis present

## 2020-01-25 DIAGNOSIS — Y999 Unspecified external cause status: Secondary | ICD-10-CM | POA: Diagnosis not present

## 2020-01-25 DIAGNOSIS — F0390 Unspecified dementia without behavioral disturbance: Secondary | ICD-10-CM | POA: Diagnosis not present

## 2020-01-25 DIAGNOSIS — Z96691 Finger-joint replacement of right hand: Secondary | ICD-10-CM | POA: Insufficient documentation

## 2020-01-25 DIAGNOSIS — W182XXA Fall in (into) shower or empty bathtub, initial encounter: Secondary | ICD-10-CM | POA: Diagnosis not present

## 2020-01-25 DIAGNOSIS — Y92121 Bathroom in nursing home as the place of occurrence of the external cause: Secondary | ICD-10-CM | POA: Diagnosis not present

## 2020-01-25 DIAGNOSIS — J449 Chronic obstructive pulmonary disease, unspecified: Secondary | ICD-10-CM | POA: Diagnosis not present

## 2020-01-25 DIAGNOSIS — Y93E1 Activity, personal bathing and showering: Secondary | ICD-10-CM | POA: Diagnosis not present

## 2020-01-25 DIAGNOSIS — I5022 Chronic systolic (congestive) heart failure: Secondary | ICD-10-CM | POA: Diagnosis not present

## 2020-01-25 DIAGNOSIS — Z951 Presence of aortocoronary bypass graft: Secondary | ICD-10-CM | POA: Insufficient documentation

## 2020-01-25 DIAGNOSIS — E039 Hypothyroidism, unspecified: Secondary | ICD-10-CM | POA: Insufficient documentation

## 2020-01-25 DIAGNOSIS — Z79899 Other long term (current) drug therapy: Secondary | ICD-10-CM | POA: Diagnosis not present

## 2020-01-25 DIAGNOSIS — Z7722 Contact with and (suspected) exposure to environmental tobacco smoke (acute) (chronic): Secondary | ICD-10-CM | POA: Insufficient documentation

## 2020-01-25 DIAGNOSIS — M25551 Pain in right hip: Secondary | ICD-10-CM | POA: Diagnosis not present

## 2020-01-25 DIAGNOSIS — N183 Chronic kidney disease, stage 3 unspecified: Secondary | ICD-10-CM | POA: Insufficient documentation

## 2020-01-25 HISTORY — DX: Unspecified dementia, unspecified severity, without behavioral disturbance, psychotic disturbance, mood disturbance, and anxiety: F03.90

## 2020-01-25 MED ORDER — LIDOCAINE-EPINEPHRINE-TETRACAINE (LET) SOLUTION
3.0000 mL | Freq: Once | NASAL | Status: AC
  Administered 2020-01-25: 3 mL via TOPICAL
  Filled 2020-01-25 (×2): qty 3

## 2020-01-25 MED ORDER — CARVEDILOL 12.5 MG PO TABS
12.5000 mg | ORAL_TABLET | Freq: Once | ORAL | Status: AC
  Administered 2020-01-25: 12.5 mg via ORAL
  Filled 2020-01-25: qty 1

## 2020-01-25 NOTE — ED Notes (Signed)
Nursing supervisor at [facility] gave call back number of [phone]. When available she states she would like an update.

## 2020-01-25 NOTE — ED Triage Notes (Signed)
Pt BIB EMS from [facility]. Pt in shower room with CNA, lost balance and fell face first on floor. Laceration to left forehead. Facility denies LOC, also reports pt at baseline. Pt has hx of dementia.  118/70 64 HR 98% RA 98.3

## 2020-01-25 NOTE — ED Provider Notes (Signed)
[facility]-EMERGENCY DEPT Provider Note   CSN: [id number] Arrival date & time: 01/25/20  1351     History Chief Complaint  Patient presents with  . Fall  . Head Laceration    [name] is a 86 y.o. female with a past medical history of dementia, anemia, COPD, hypothyroidism, ischemic cardiomyopathy, status post CABG who presents after mechanical fall. There is a level 5 caveat due to dementia. History is gathered from EMS at bedside. The patient was in her shower room being bathed. The nurses stood her up and she slipped, fell forward and hit her face against the shower wall. She suffered a laceration to the left brow.  I spoke with the hospice care nurse on call Aundra Millet who was unaware of her goals of care.  I also discussed goals of care with the patient's son Cherre Kothari who states that he would want imaging only to make sure she does not have fractures to provide comfort care but would not want any aggressive management.  HPI     Past Medical History:  Diagnosis Date  . Anemia    takes iron 3 days per week  . Asthma   . Chronic systolic heart failure (HCC)    NYHA class II.  [name] COPD (chronic obstructive pulmonary disease) (HCC)   . Dementia (HCC)   . DJD (degenerative joint disease), cervical   . DJD (degenerative joint disease), lumbar   . FH: mitral valve repair    with 26 mm Edwards ring angioplasty,   . Full dentures   . GERD (gastroesophageal reflux disease)   . HTN (hypertension)   . Hx of cardiovascular stress test    Lexiscan Myoview (9/15):  Normal stress nuclear study.  LV Ejection Fraction: 76%  . Hyperthyroidism    following Graves disease  . Ischemic cardiomyopathy    severe. Left ventricular ejection fraction 20%.   . Neuromuscular scoliosis of thoracolumbar region    type of scoliosis was not specified.   . Raynaud's syndrome   . Renal artery stenosis (HCC)    Treated with angioplast in 1980 and 1987.   . S/P CABG (coronary  artery bypass graft) April 2012    Patient Active Problem List   Diagnosis Date Noted  . History of femur fracture 09/15/2016  . Acute pain of left knee 09/15/2016  . Sepsis (HCC) 07/09/2016  . UTI (urinary tract infection) 07/09/2016  . Acute encephalopathy 07/09/2016  . Acute renal failure superimposed on stage 3 chronic kidney disease (HCC) 07/09/2016  . Hyperglycemia 07/09/2016  . Acute cystitis with hematuria   . AKI (acute kidney injury) (HCC)   . Nausea and vomiting 09/25/2015  . Mild dementia (HCC)   . Hypertensive urgency 09/24/2015  . Eye swollen, left 11/12/2014  . Rash, right thigh 11/12/2014  . Femur fracture, left (HCC) 09/29/2014  . Hypothyroidism 09/29/2014  . Coronary atherosclerosis of native coronary artery 06/03/2014  . S/P mitral valve repair 06/03/2014  . Total knee replacement status 04/29/2014  . Dizziness 08/28/2013  . FH: mitral valve repair   . COPD (chronic obstructive pulmonary disease) (HCC)   . BBB (bundle branch block)   . Ischemic cardiomyopathy   . Automatic implantable cardioverter-defibrillator in situ 06/24/2012  . Preop respiratory exam 05/17/2012  . Chronic systolic heart failure (HCC) 03/01/2011  . Other specified forms of chronic ischemic heart disease 03/01/2011  . S/P CABG (coronary artery bypass graft) 12/25/2010  . SHORTNESS OF BREATH (SOB) 05/07/2009  .  Hyperlipidemia 05/06/2009  . Essential hypertension 05/06/2009    Past Surgical History:  Procedure Laterality Date  . APPENDECTOMY    . BIV ICD GENERTAOR CHANGE OUT N/A 11/11/2014   Procedure: BIV ICD GENERTAOR CHANGE OUT;  Surgeon: Thompson Grayer, MD;  Location: [facility] CATH LAB;  Service: Cardiovascular;  Laterality: N/A;  . BREAST LUMPECTOMY     left breast  . CARPOMETACARPEL SUSPENSION PLASTY Right 11/12/2013   Procedure: SUSPENSION PLASTY RIGHT THUMB, TRAPEZIUM EXCISION;  Surgeon: Wynonia Sours, MD;  Location: [place];  Service: Orthopedics;  Laterality: Right;    . CATARACT EXTRACTION    . CERVICAL FUSION     C5-6 and C6-7, C4-5 with titanium plates  . CORONARY ARTERY BYPASS GRAFT  2010   mvr/cabg  . ESOPHAGEAL MANOMETRY N/A 04/07/2013   Procedure: ESOPHAGEAL MANOMETRY (EM);  Surgeon: Garlan Fair, MD;  Location: [facility] ENDOSCOPY;  Service: Endoscopy;  Laterality: N/A;  . FACIAL COSMETIC SURGERY    . FEMUR IM NAIL Left 09/29/2014   Procedure: Affixus Trochanteric Femoral Nail;  Surgeon: Marybelle Killings, MD;  Location: [facility] ORS;  Service: Orthopedics;  Laterality: Left;  . FINGER ARTHROPLASTY  07/10/2012   Procedure: FINGER ARTHROPLASTY;  Surgeon: Wynonia Sours, MD;  Location: [place];  Service: Orthopedics;  Laterality: Right;  METACARPAL PHALANGEAL ARTHROPLASTIES RIGHT INDEX, MIDDLE, AND RING FINGERS   . HEMORRHOIDECTOMY WITH HEMORRHOID BANDING    . implantation of ICD  2010   BiV ICD implant (SJM) by Dr Leonia Reeves 03/2009  . laminotomy/foraminotomy     with decompression of the L4 nerve root   . lung mass removal Right   . precancerous growth     tops of ear removed. bilateral.   . PTCA     of bilateral renal arteries  . REPAIR EXTENSOR TENDON  07/10/2012   Procedure: REPAIR EXTENSOR TENDON;  Surgeon: Wynonia Sours, MD;  Location: [place];  Service: Orthopedics;  Laterality: Right;  METACARPAL PHALANGEAL REPLACEMENT ARTHROPLASTIES RIGHT INDEX, MIDDLE, AND RING FINGERS    . ROTATOR CUFF REPAIR     right and left  . SYMPATHECTOMY    . TENDON TRANSFER Right 11/12/2013   Procedure: RIGHT ABDUCTOR POLLICUS LONGUS TENDON TRANSFER;  Surgeon: Wynonia Sours, MD;  Location: [place];  Service: Orthopedics;  Laterality: Right;  . TONSILLECTOMY    . TOTAL ABDOMINAL HYSTERECTOMY    . TOTAL KNEE ARTHROPLASTY Left 04/29/2014   Procedure: LEFT TOTAL KNEE ARTHROPLASTY;  Surgeon: Newt Minion, MD;  Location: [place];  Service: Orthopedics;  Laterality: Left;  . UMBILICAL HERNIA REPAIR    . [place] RESECTION  2001   for  the right upper lobe for Aspergillus treatement.  Dr. Arlyce Dice apprix 2001.     OB History   No obstetric history on file.     Family History  Problem Relation Age of Onset  . Heart disease Father   . Hypertension Father   . Colon cancer Other        grandmother    Social History   Tobacco Use  . Smoking status: Never Smoker  . Smokeless tobacco: Never Used  . Tobacco comment: passive smoker from birth to age 49 (mom and husband)  Substance Use Topics  . Alcohol use: No  . Drug use: No    Home Medications Prior to Admission medications   Medication Sig Start Date End Date Taking? Authorizing Provider  acetaminophen (TYLENOL) 500 MG tablet Take  500 mg by mouth 2 (two) times daily.    [provider]  carvedilol (COREG) 12.5 MG tablet Take 1 tablet (12.5 mg total) by mouth 2 (two) times daily. Patient not taking: Reported on 06/20/2018 04/01/14   Jake Bathe, MD  carvedilol (COREG) 6.25 MG tablet Take 6.25 mg by mouth 2 (two) times daily with a meal.    [provider]  cholecalciferol (VITAMIN D) 1000 units tablet Take 1,000 Units by mouth daily.    [provider]  levothyroxine (SYNTHROID, LEVOTHROID) 88 MCG tablet Take 88 mcg by mouth daily before breakfast.    [provider]  montelukast (SINGULAIR) 10 MG tablet Take 10 mg by mouth at bedtime.    [provider]  NAMZARIC 28-10 MG CP24 Take 1 capsule by mouth daily. 08/30/15   [provider]  Probiotic Product (PROBIOTIC COLON SUPPORT PO) Take 1 tablet by mouth daily.    [provider]  sertraline (ZOLOFT) 25 MG tablet Take 25 mg by mouth daily.    [provider]  traMADol (ULTRAM) 50 MG tablet Take 50 mg by mouth every 12 (twelve) hours.    [provider]  UNABLE TO FIND Med Pass 2.0- Drink 4 ounces by mouth two times a day    [provider]    Allergies    Alprazolam, Doxycycline, Fulvicin p-g [griseofulvin], Hydrocodone,  Ketoconazole, Serevent [salmeterol], Calcitonin (salmon), Morphine and related, Amitriptyline, Cefuroxime axetil, Co q 10 [coenzyme q10], Hydrocodone-acetaminophen, Lorcet [hydrocodone-acetaminophen], Other, Percodan [oxycodone-aspirin], Plendil [felodipine], Sulfamethoxazole-trimethoprim, Sulfonamide derivatives, Trovan [alatrofloxacin], Ventolin [albuterol], Aspirin, Cephalexin, Ciprofloxacin, Clonazepam, Erythromycin, Hydromorphone, Miacalcin [calcitonin (salmon)], Oxycodone-aspirin, Penicillins, and Tapazole [methimazole]  Review of Systems   Review of Systems  Unable to perform ROS: Dementia     Physical Exam Updated Vital Signs There were no vitals taken for this visit.  Physical Exam Vitals and nursing note reviewed.  Constitutional:      General: She is not in acute distress.    Appearance: She is well-developed. She is not diaphoretic.  HENT:     Head: Normocephalic.     Comments: Tissue avulsion to the left forehead Eyes:     General: No scleral icterus.    Conjunctiva/sclera: Conjunctivae normal.  Cardiovascular:     Rate and Rhythm: Normal rate and regular rhythm.     Heart sounds: Normal heart sounds. No murmur. No friction rub. No gallop.   Pulmonary:     Effort: Pulmonary effort is normal. No respiratory distress.     Breath sounds: Normal breath sounds.  Abdominal:     General: Bowel sounds are normal. There is no distension.     Palpations: Abdomen is soft. There is no mass.     Tenderness: There is no abdominal tenderness. There is no guarding.  Musculoskeletal:     Cervical back: Normal range of motion.  Skin:    General: Skin is warm and dry.  Neurological:     Mental Status: She is alert and oriented to person, place, and time.  Psychiatric:        Behavior: Behavior normal.     ED Results / Procedures / Treatments   Labs (all labs ordered are listed, but only abnormal results are displayed) Labs Reviewed - No data to  display  EKG None  Radiology No results found.  Procedures .Marland KitchenLaceration Repair  Date/Time: 01/25/2020 4:47 PM Performed by: Arthor Captain, PA-C Authorized by: Arthor Captain, PA-C   Consent:    Consent given by:  Healthcare agent   Risks discussed:  Infection, need for additional repair, pain, poor cosmetic result and poor wound healing   Alternatives discussed:  No treatment and delayed treatment Universal protocol:    Procedure explained and questions answered to patient or proxy's satisfaction: yes     Relevant documents present and verified: yes     Test results available and properly labeled: yes     Imaging studies available: yes     Required blood products, implants, devices, and special equipment available: yes     Site/side marked: yes     Immediately prior to procedure, a time out was called: yes     Patient identity confirmed:  Arm band Anesthesia (see MAR for exact dosages):    Anesthesia method:  Local infiltration and topical application   Topical anesthetic:  LET Laceration details:    Location:  Face   Face location:  Forehead   Wound length (cm): 3 cm. Repair type:    Repair type:  Intermediate Pre-procedure details:    Preparation:  Patient was prepped and draped in usual sterile fashion Exploration:    Hemostasis achieved with:  LET Treatment:    Area cleansed with:  Saline   Amount of cleaning:  Standard Skin repair:    Repair method:  Tissue adhesive Approximation:    Approximation:  Close Post-procedure details:    Dressing:  Adhesive bandage   Patient tolerance of procedure:  Tolerated well, no immediate complications Comments:     Patient with tissue avulsion of the left forehead.  I was able to approximate some of the tissue however there is a large area without tissue that I am able to repair.  This will need to heal by secondary intention.  Bandage placed.   (including critical care time)  Medications Ordered in ED Medications - No  data to display  ED Course  I have reviewed the triage vital signs and the nursing notes.  Pertinent labs & imaging results that were available during my care of the patient were reviewed by me and considered in my medical decision making (see chart for details).    MDM Rules/Calculators/A&P                      86 year old female hospice patient who had a mechanical fall in the shower today that was witnessed by her caretakers at her skilled nursing facility.  She has a tissue avulsion to the left forehead.  I discussed goals of care with the hospice nurse on call and the patient's son and have updated him on the findings.  She had a bilateral hip x-ray, head CT, CT C-spine and CT maxillofacial which I ordered.  I personally reviewed the images and they showed no acute abnormalities today.  I have repaired the patient's forehead wound as best I can however there is a large area of tissue avulsion that will require secondary intention for repair.  I placed the bandage.  Patient appears all otherwise appropriate for discharge at this time to her skilled nursing facility. Final Clinical Impression(s) / ED Diagnoses Final diagnoses:  None    Rx / DC Orders ED Discharge Orders    None       Arthor Captain, PA-C 01/25/20 1655    Melene Plan, DO 01/25/20 1712

## 2020-01-25 NOTE — ED Notes (Signed)
Called again to give report, no answer.

## 2020-01-25 NOTE — Discharge Instructions (Signed)
Patient has a tissue avulsion injury to the left forehead.  Part of the skin was able to be reapproximated and has tissue adhesive.  However there is a large area of missing tissue which could not be approximated with skin sutures.  This will need to heal in over time.  Please keep the wound clean with gentle cleansing twice daily.  Change the bandage.  You may use antibiotic ointment below the area of tissue adhesive. Get help right away if: You have a red streak going away from your wound. A wound that was closed breaks open. The wound is bleeding, and the bleeding does not stop with gentle pressure. You have trouble breathing. The wound is on your hand or foot and: You cannot properly move a finger or toe. Your fingers or toes look pale or bluish.

## 2020-02-05 ENCOUNTER — Ambulatory Visit (INDEPENDENT_AMBULATORY_CARE_PROVIDER_SITE_OTHER): Payer: Medicare Other | Admitting: *Deleted

## 2020-02-05 DIAGNOSIS — I255 Ischemic cardiomyopathy: Secondary | ICD-10-CM | POA: Diagnosis not present

## 2020-02-05 LAB — CUP PACEART REMOTE DEVICE CHECK
Battery Remaining Longevity: 22 mo
Battery Remaining Percentage: 28 %
Battery Voltage: 2.92 V
Brady Statistic AP VP Percent: 41 %
Brady Statistic AP VS Percent: 1 %
Brady Statistic AS VP Percent: 57 %
Brady Statistic AS VS Percent: 1 %
Brady Statistic RA Percent Paced: 42 %
Date Time Interrogation Session: 20210513040016
HighPow Impedance: 49 Ohm
HighPow Impedance: 49 Ohm
Implantable Lead Implant Date: 20100707
Implantable Lead Implant Date: 20100707
Implantable Lead Implant Date: 20100707
Implantable Lead Location: 753858
Implantable Lead Location: 753859
Implantable Lead Location: 753860
Implantable Lead Model: 7120
Implantable Pulse Generator Implant Date: 20160217
Lead Channel Impedance Value: 350 Ohm
Lead Channel Impedance Value: 410 Ohm
Lead Channel Impedance Value: 540 Ohm
Lead Channel Pacing Threshold Amplitude: 0.5 V
Lead Channel Pacing Threshold Amplitude: 1 V
Lead Channel Pacing Threshold Amplitude: 1.25 V
Lead Channel Pacing Threshold Pulse Width: 0.5 ms
Lead Channel Pacing Threshold Pulse Width: 0.5 ms
Lead Channel Pacing Threshold Pulse Width: 0.6 ms
Lead Channel Sensing Intrinsic Amplitude: 1 mV
Lead Channel Sensing Intrinsic Amplitude: 12 mV
Lead Channel Setting Pacing Amplitude: 2 V
Lead Channel Setting Pacing Amplitude: 2 V
Lead Channel Setting Pacing Amplitude: 2.5 V
Lead Channel Setting Pacing Pulse Width: 0.5 ms
Lead Channel Setting Pacing Pulse Width: 0.6 ms
Lead Channel Setting Sensing Sensitivity: 0.5 mV
Pulse Gen Serial Number: 7226908

## 2020-02-09 NOTE — Progress Notes (Signed)
Remote ICD transmission.   

## 2020-02-17 ENCOUNTER — Telehealth: Payer: Self-pay

## 2020-02-17 NOTE — Telephone Encounter (Signed)
I tried to explain to the nurse that we do not de-activate ppm. I let her talk with Arline Asp, rn.

## 2020-02-17 NOTE — Telephone Encounter (Signed)
[facility] rn contacted device clinic to have remote monitoring discontined due to patient being on Hospice care. Tachy therapies were disabled on 12/04/19. Instructed [facility] staff to [employer] and all remote transmissions discontinued in [place] and [employer].

## 2020-03-01 NOTE — Progress Notes (Signed)
Therapist, nutritional Palliative Care Consult Note Telephone: [phone]  Fax: [phone]  PATIENT NAME: [name] DOB: [birth year] MRN: [id number]  PRIMARY CARE PROVIDER:   Jethro Bastos, MD  REFERRING PROVIDER:  Lytle Butte, NP/ [place] [address] [place], [place] [address]  RESPONSIBLE PARTYRussella Dar Checketts(son) [phone] cell,  [phone] wk    RECOMMENDATIONS and PLAN:  Palliative Care Encounter  Z51.5  1.  Advance care planning:  DNAR status previously selected. Patient is unable to make own decisions due to cognitive deficits. She will remain as a long-term care patient at the facility.  Left a message for son to call this NP. Palliative care will continue to follow patient.   2.  Vascular dementia: Stable with current use of Namzarec. FAST stage 7b Continue supportive care.  Provide safe environment. Monitor for cognitive and functional changes.   Due to the COVID-19 crisis, this visit was performed via telehealth from my office and was consented by patient and or family.  I spent 30 minutes providing this consultation,  from 1200 to 1230. More than 50% of the time in this consultation was spent coordinating communication with patient and clinical staff.   HISTORY OF PRESENT ILLNESS: Follow-up with [name].  Nurse reports that she is dependent upon staff for all ADLs and some feedings, she is incontinent of B&B and is non-ambulatory. Her appetite varies and she is still able to feed self.   Palliative Care was asked to help address goals of care.   CODE STATUS: DNAR  PPS: 30% HOSPICE ELIGIBILITY/DIAGNOSIS: TBD  PAST MEDICAL HISTORY:  Past Medical History:  Diagnosis Date  . Anemia    takes iron 3 days per week  . Asthma   . Chronic systolic heart failure (HCC)    NYHA class II.  [name] COPD (chronic obstructive pulmonary disease) (HCC)   . DJD (degenerative joint disease), cervical   . DJD (degenerative  joint disease), lumbar   . FH: mitral valve repair    with 26 mm Edwards ring angioplasty,   . Full dentures   . GERD (gastroesophageal reflux disease)   . HTN (hypertension)   . Hx of cardiovascular stress test    Lexiscan Myoview (9/15):  Normal stress nuclear study.  LV Ejection Fraction: 76%  . Hyperthyroidism    following Graves disease  . Ischemic cardiomyopathy    severe. Left ventricular ejection fraction 20%.   . Neuromuscular scoliosis of thoracolumbar region    type of scoliosis was not specified.   . Raynaud's syndrome   . Renal artery stenosis (HCC)    Treated with angioplast in 1980 and 1987.   . S/P CABG (coronary artery bypass graft) April 2012       PERTINENT MEDICATIONS:  Outpatient Encounter Medications as of 02/26/2019  Medication Sig  . acetaminophen (TYLENOL) 500 MG tablet Take 500 mg by mouth 2 (two) times daily.  . carvedilol (COREG) 12.5 MG tablet Take 1 tablet (12.5 mg total) by mouth 2 (two) times daily. (Patient not taking: Reported on 06/20/2018)  . carvedilol (COREG) 6.25 MG tablet Take 6.25 mg by mouth 2 (two) times daily with a meal.  . cholecalciferol (VITAMIN D) 1000 units tablet Take 1,000 Units by mouth daily.  [name] levothyroxine (SYNTHROID, LEVOTHROID) 88 MCG tablet Take 88 mcg by mouth daily before breakfast.  . montelukast (SINGULAIR) 10 MG tablet Take 10 mg by mouth at bedtime.  [name] NAMZARIC 28-10 MG CP24  Take 1 capsule by mouth daily.  . Probiotic Product (PROBIOTIC COLON SUPPORT PO) Take 1 tablet by mouth daily.  . sertraline (ZOLOFT) 25 MG tablet Take 25 mg by mouth daily.  . traMADol (ULTRAM) 50 MG tablet Take 50 mg by mouth every 12 (twelve) hours.  [name] UNABLE TO FIND Med Pass 2.0- Drink 4 ounces by mouth two times a day   No facility-administered encounter medications on file as of 02/26/2019.     PHYSICAL EXAM:   General: NAD, frail and fragile appearing elderly female Cardiovascular: unable to assess Pulmonary: no increased respiratory  effort Extremities: no edema,  Skin: pale in color, exposed skin is intact Neurological: Alert. Unable to determine orientation due to cognitive decline. Weakness but otherwise nonfocal  Gonzella Lex, NP-C

## 2020-04-26 ENCOUNTER — Emergency Department (HOSPITAL_BASED_OUTPATIENT_CLINIC_OR_DEPARTMENT_OTHER)
Admission: EM | Admit: 2020-04-26 | Discharge: 2020-04-26 | Disposition: A | Discharge status: Home / Self Care | Attending: Emergency Medicine | Admitting: Emergency Medicine

## 2020-04-26 ENCOUNTER — Encounter (HOSPITAL_BASED_OUTPATIENT_CLINIC_OR_DEPARTMENT_OTHER): Payer: Self-pay | Admitting: Emergency Medicine

## 2020-04-26 ENCOUNTER — Other Ambulatory Visit: Payer: Self-pay

## 2020-04-26 DIAGNOSIS — Z951 Presence of aortocoronary bypass graft: Secondary | ICD-10-CM | POA: Diagnosis not present

## 2020-04-26 DIAGNOSIS — I5022 Chronic systolic (congestive) heart failure: Secondary | ICD-10-CM | POA: Insufficient documentation

## 2020-04-26 DIAGNOSIS — Y999 Unspecified external cause status: Secondary | ICD-10-CM | POA: Diagnosis not present

## 2020-04-26 DIAGNOSIS — N183 Chronic kidney disease, stage 3 unspecified: Secondary | ICD-10-CM | POA: Diagnosis not present

## 2020-04-26 DIAGNOSIS — Y939 Activity, unspecified: Secondary | ICD-10-CM | POA: Insufficient documentation

## 2020-04-26 DIAGNOSIS — Z954 Presence of other heart-valve replacement: Secondary | ICD-10-CM | POA: Insufficient documentation

## 2020-04-26 DIAGNOSIS — W268XXA Contact with other sharp object(s), not elsewhere classified, initial encounter: Secondary | ICD-10-CM | POA: Diagnosis not present

## 2020-04-26 DIAGNOSIS — F0390 Unspecified dementia without behavioral disturbance: Secondary | ICD-10-CM | POA: Insufficient documentation

## 2020-04-26 DIAGNOSIS — I2510 Atherosclerotic heart disease of native coronary artery without angina pectoris: Secondary | ICD-10-CM | POA: Diagnosis not present

## 2020-04-26 DIAGNOSIS — S81811A Laceration without foreign body, right lower leg, initial encounter: Secondary | ICD-10-CM | POA: Insufficient documentation

## 2020-04-26 DIAGNOSIS — Z79890 Hormone replacement therapy (postmenopausal): Secondary | ICD-10-CM | POA: Insufficient documentation

## 2020-04-26 DIAGNOSIS — E039 Hypothyroidism, unspecified: Secondary | ICD-10-CM | POA: Diagnosis not present

## 2020-04-26 DIAGNOSIS — Y929 Unspecified place or not applicable: Secondary | ICD-10-CM | POA: Insufficient documentation

## 2020-04-26 DIAGNOSIS — I130 Hypertensive heart and chronic kidney disease with heart failure and stage 1 through stage 4 chronic kidney disease, or unspecified chronic kidney disease: Secondary | ICD-10-CM | POA: Diagnosis not present

## 2020-04-26 DIAGNOSIS — J449 Chronic obstructive pulmonary disease, unspecified: Secondary | ICD-10-CM | POA: Diagnosis not present

## 2020-04-26 DIAGNOSIS — Z79899 Other long term (current) drug therapy: Secondary | ICD-10-CM | POA: Diagnosis not present

## 2020-04-26 DIAGNOSIS — S8991XA Unspecified injury of right lower leg, initial encounter: Secondary | ICD-10-CM | POA: Diagnosis present

## 2020-04-26 MED ORDER — LIDOCAINE-EPINEPHRINE 1 %-1:100000 IJ SOLN
INTRAMUSCULAR | Status: AC
  Administered 2020-04-26: 10 mL
  Filled 2020-04-26: qty 1

## 2020-04-26 NOTE — ED Provider Notes (Signed)
[facility] EMERGENCY DEPARTMENT Provider Note   CSN: [id number] Arrival date & time: 04/26/20  1338     History Chief Complaint  Patient presents with  . Leg Injury    [name] is a 86 y.o. female.  HPI      Hx obtained via EMS and prior notes and limited by pt dementia  86yo female with PMH below and under [facility] with vascular dementia with cognitive and functional decline who presents with right leg skin tear/laceration which was obtained on her wheelchair.  Occurred from footrest of wheelchair. No falls, no trauma, EMS did not report any other concerns.  Has had decline over time.  Past Medical History:  Diagnosis Date  . Anemia    takes iron 3 days per week  . Asthma   . Chronic systolic heart failure (HCC)    NYHA class II.  [name] COPD (chronic obstructive pulmonary disease) (HCC)   . Dementia (HCC)   . DJD (degenerative joint disease), cervical   . DJD (degenerative joint disease), lumbar   . FH: mitral valve repair    with 26 mm Edwards ring angioplasty,   . Full dentures   . GERD (gastroesophageal reflux disease)   . HTN (hypertension)   . Hx of cardiovascular stress test    Lexiscan Myoview (9/15):  Normal stress nuclear study.  LV Ejection Fraction: 76%  . Hyperthyroidism    following Graves disease  . Ischemic cardiomyopathy    severe. Left ventricular ejection fraction 20%.   . Neuromuscular scoliosis of thoracolumbar region    type of scoliosis was not specified.   . Raynaud's syndrome   . Renal artery stenosis (HCC)    Treated with angioplast in 1980 and 1987.   . S/P CABG (coronary artery bypass graft) April 2012    Patient Active Problem List   Diagnosis Date Noted  . History of femur fracture 09/15/2016  . Acute pain of left knee 09/15/2016  . Sepsis (HCC) 07/09/2016  . UTI (urinary tract infection) 07/09/2016  . Acute encephalopathy 07/09/2016  . Acute renal failure superimposed on stage 3 chronic kidney disease  (HCC) 07/09/2016  . Hyperglycemia 07/09/2016  . Acute cystitis with hematuria   . AKI (acute kidney injury) (HCC)   . Nausea and vomiting 09/25/2015  . Mild dementia (HCC)   . Hypertensive urgency 09/24/2015  . Eye swollen, left 11/12/2014  . Rash, right thigh 11/12/2014  . Femur fracture, left (HCC) 09/29/2014  . Hypothyroidism 09/29/2014  . Coronary atherosclerosis of native coronary artery 06/03/2014  . S/P mitral valve repair 06/03/2014  . Total knee replacement status 04/29/2014  . Dizziness 08/28/2013  . FH: mitral valve repair   . COPD (chronic obstructive pulmonary disease) (HCC)   . BBB (bundle branch block)   . Ischemic cardiomyopathy   . Automatic implantable cardioverter-defibrillator in situ 06/24/2012  . Preop respiratory exam 05/17/2012  . Chronic systolic heart failure (HCC) 03/01/2011  . Other specified forms of chronic ischemic heart disease 03/01/2011  . S/P CABG (coronary artery bypass graft) 12/25/2010  . SHORTNESS OF BREATH (SOB) 05/07/2009  . Hyperlipidemia 05/06/2009  . Essential hypertension 05/06/2009    Past Surgical History:  Procedure Laterality Date  . APPENDECTOMY    . BIV ICD GENERTAOR CHANGE OUT N/A 11/11/2014   Procedure: BIV ICD GENERTAOR CHANGE OUT;  Surgeon: Hillis Range, MD;  Location: [facility] CATH LAB;  Service: Cardiovascular;  Laterality: N/A;  . BREAST LUMPECTOMY     left breast  .  CARPOMETACARPEL SUSPENSION PLASTY Right 11/12/2013   Procedure: SUSPENSION PLASTY RIGHT THUMB, TRAPEZIUM EXCISION;  Surgeon: Nicki Reaper, MD;  Location: [facility];  Service: Orthopedics;  Laterality: Right;  . CATARACT EXTRACTION    . CERVICAL FUSION     C5-6 and C6-7, C4-5 with titanium plates  . CORONARY ARTERY BYPASS GRAFT  2010   mvr/cabg  . ESOPHAGEAL MANOMETRY N/A 04/07/2013   Procedure: ESOPHAGEAL MANOMETRY (EM);  Surgeon: Charolett Bumpers, MD;  Location: [facility];  Service: Endoscopy;  Laterality: N/A;  . FACIAL COSMETIC SURGERY      . FEMUR IM NAIL Left 09/29/2014   Procedure: Affixus Trochanteric Femoral Nail;  Surgeon: Eldred Manges, MD;  Location: [facility]S;  Service: Orthopedics;  Laterality: Left;  . FINGER ARTHROPLASTY  07/10/2012   Procedure: FINGER ARTHROPLASTY;  Surgeon: Nicki Reaper, MD;  Location: [facility];  Service: Orthopedics;  Laterality: Right;  METACARPAL PHALANGEAL ARTHROPLASTIES RIGHT INDEX, MIDDLE, AND RING FINGERS   . HEMORRHOIDECTOMY WITH HEMORRHOID BANDING    . implantation of ICD  2010   BiV ICD implant (SJM) by Dr Amil Amen 03/2009  . laminotomy/foraminotomy     with decompression of the L4 nerve root   . lung mass removal Right   . precancerous growth     tops of ear removed. bilateral.   . PTCA     of bilateral renal arteries  . REPAIR EXTENSOR TENDON  07/10/2012   Procedure: REPAIR EXTENSOR TENDON;  Surgeon: Nicki Reaper, MD;  Location: [facility];  Service: Orthopedics;  Laterality: Right;  METACARPAL PHALANGEAL REPLACEMENT ARTHROPLASTIES RIGHT INDEX, MIDDLE, AND RING FINGERS    . ROTATOR CUFF REPAIR     right and left  . SYMPATHECTOMY    . TENDON TRANSFER Right 11/12/2013   Procedure: RIGHT ABDUCTOR POLLICUS LONGUS TENDON TRANSFER;  Surgeon: Nicki Reaper, MD;  Location: [facility];  Service: Orthopedics;  Laterality: Right;  . TONSILLECTOMY    . TOTAL ABDOMINAL HYSTERECTOMY    . TOTAL KNEE ARTHROPLASTY Left 04/29/2014   Procedure: LEFT TOTAL KNEE ARTHROPLASTY;  Surgeon: Nadara Mustard, MD;  Location: [facility];  Service: Orthopedics;  Laterality: Left;  . UMBILICAL HERNIA REPAIR    . WEDGE RESECTION  2001   for the right upper lobe for Aspergillus treatement.  Dr. Edwyna Shell apprix 2001.     OB History   No obstetric history on file.     Family History  Problem Relation Age of Onset  . Heart disease Father   . Hypertension Father   . Colon cancer Other        grandmother    Social History   Tobacco Use  . Smoking status: Never Smoker  .  Smokeless tobacco: Never Used  . Tobacco comment: passive smoker from birth to age 77 (mom and husband)  Vaping Use  . Vaping Use: Never used  Substance Use Topics  . Alcohol use: No  . Drug use: No    Home Medications Prior to Admission medications   Medication Sig Start Date End Date Taking? Authorizing Provider  carvedilol (COREG) 12.5 MG tablet Take 1 tablet (12.5 mg total) by mouth 2 (two) times daily. Patient not taking: Reported on 06/20/2018 04/01/14   Jake Bathe, MD  Cholecalciferol (VITAMIN D3) 50 MCG (2000 UT) TABS Take 2,000 Units by mouth daily.    [provider]  Ensure (ENSURE) Take 237 mLs by mouth in the morning, at noon,  in the evening, and at bedtime.    [provider]  levothyroxine (SYNTHROID) 125 MCG tablet Take 125 mcg by mouth daily before breakfast.    [provider]  mirtazapine (REMERON) 15 MG tablet Take 7.5 mg by mouth daily.    [provider]  NAMZARIC 28-10 MG CP24 Take 1 capsule by mouth daily. 08/30/15   [provider]  Probiotic Product (PHILLIPS COLON HEALTH) CAPS Take 1 capsule by mouth daily.    [provider]  senna-docusate (SENOKOT-S) 8.6-50 MG tablet Take 2 tablets by mouth daily.    [provider]  sertraline (ZOLOFT) 50 MG tablet Take 50 mg by mouth daily.    [provider]  traMADol (ULTRAM) 50 MG tablet Take 25 mg by mouth every 12 (twelve) hours.     [provider]    Allergies    Alprazolam, Doxycycline, Fulvicin p-g [griseofulvin], Hydrocodone, Ketoconazole, Serevent [salmeterol], Calcitonin (salmon), Morphine and related, Amitriptyline, Cefuroxime axetil, Co q 10 [coenzyme q10], Hydrocodone-acetaminophen, Lorcet [hydrocodone-acetaminophen], Other, Percodan [oxycodone-aspirin], Plendil [felodipine], Sulfamethoxazole-trimethoprim, Sulfonamide derivatives, Trovan [alatrofloxacin], Ventolin [albuterol], Aspirin, Cephalexin, Ciprofloxacin, Clonazepam,  Erythromycin, Hydromorphone, Miacalcin [calcitonin (salmon)], Oxycodone-aspirin, Penicillins, and Tapazole [methimazole]  Review of Systems   Review of Systems  Unable to perform ROS: Dementia    Physical Exam Updated Vital Signs BP (!) 110/91   Pulse 92   Temp 98.9 F (37.2 C) (Oral)   Resp (!) 22   SpO2 95%   Physical Exam Vitals and nursing note reviewed.  Constitutional:      General: She is not in acute distress.    Appearance: Normal appearance. She is cachectic. She is not ill-appearing, toxic-appearing or diaphoretic.  HENT:     Head: Normocephalic.  Eyes:     Conjunctiva/sclera: Conjunctivae normal.  Cardiovascular:     Rate and Rhythm: Normal rate and regular rhythm.     Pulses: Normal pulses.  Pulmonary:     Effort: Pulmonary effort is normal. No respiratory distress.  Musculoskeletal:        General: No deformity or signs of injury.     Cervical back: No rigidity.  Skin:    General: Skin is warm and dry.     Coloration: Skin is not jaundiced or pale.     Comments: Right lower leg 3cm diameter skin tear/abrasion with 2cm laceration within center  Neurological:     General: No focal deficit present.     Mental Status: She is alert.     Comments: Looking around but not interactive       ED Results / Procedures / Treatments   Labs (all labs ordered are listed, but only abnormal results are displayed) Labs Reviewed - No data to display  EKG None  Radiology No results found.  Procedures .Marland KitchenLaceration Repair  Date/Time: 04/26/2020 10:32 PM Performed by: Alvira Monday, MD Authorized by: Alvira Monday, MD   Consent:    Consent obtained:  Emergent situation and verbal (understood consent) Anesthesia (see MAR for exact dosages):    Anesthesia method:  Local infiltration   Local anesthetic:  Lidocaine 1% WITH epi Laceration details:    Location:  Leg   Leg location:  R lower leg   Length (cm):  2   Depth (mm):  10 Repair type:    Repair  type:  Simple Pre-procedure details:    Preparation:  Patient was prepped and draped in usual sterile fashion Treatment:    Area cleansed with:  Saline   Irrigation solution:  Sterile water and sterile saline Skin repair:    Repair method:  Sutures   Suture size:  4-0   Suture material:  Prolene   Number of sutures:  3 Post-procedure details:    Patient tolerance of procedure:  Tolerated well, no immediate complications Comments:     Performed by PA student under my supervision   (including critical care time)  Medications Ordered in ED Medications  lidocaine-EPINEPHrine (XYLOCAINE W/EPI) 1 %-1:100000 (with pres) injection (10 mLs  Given 04/26/20 1420)    ED Course  I have reviewed the triage vital signs and the nursing notes.  Pertinent labs & imaging results that were available during my care of the patient were reviewed by me and considered in my medical decision making (see chart for details).    MDM Rules/Calculators/A&P                            86yo female with PMH below and under [facility] with vascular dementia with cognitive and functional decline who presents with right leg skin tear/laceration which was obtained on her wheelchair.  No other history of fall or trauma. No report of other concerns. Laceration repaired with sutures x 3 to be removed in 10 days.    Final Clinical Impression(s) / ED Diagnoses Final diagnoses:  Noninfected skin tear of leg, right, initial encounter  Laceration of right lower leg, initial encounter    Rx / DC Orders ED Discharge Orders    None       Alvira Monday, MD 04/26/20 2241

## 2020-04-26 NOTE — ED Triage Notes (Signed)
Per EMS:  Pt from SNF.  Pt has skin tear to right leg from a wheelchair foot rest.  Noted some subcutaneous involvement.  Bleeding controlled.  Saline guaze applied.
